# Patient Record
Sex: Male | Born: 1940
Health system: Southern US, Community
[De-identification: ages and names within clinical notes are randomized; demographics above are authoritative.]

## PROBLEM LIST (undated history)

## (undated) DIAGNOSIS — J189 Pneumonia, unspecified organism: Secondary | ICD-10-CM

## (undated) DIAGNOSIS — I6522 Occlusion and stenosis of left carotid artery: Secondary | ICD-10-CM

## (undated) DIAGNOSIS — C801 Malignant (primary) neoplasm, unspecified: Secondary | ICD-10-CM

## (undated) DIAGNOSIS — I639 Cerebral infarction, unspecified: Secondary | ICD-10-CM

## (undated) DIAGNOSIS — E785 Hyperlipidemia, unspecified: Secondary | ICD-10-CM

## (undated) DIAGNOSIS — E119 Type 2 diabetes mellitus without complications: Secondary | ICD-10-CM

## (undated) HISTORY — DX: Hyperlipidemia, unspecified: E78.5

---

## 2002-07-06 ENCOUNTER — Encounter: Admission: RE | Admit: 2002-07-06 | Discharge: 2002-10-04 | Payer: Self-pay | Admitting: Family Medicine

## 2002-08-12 ENCOUNTER — Ambulatory Visit (HOSPITAL_COMMUNITY): Admission: RE | Admit: 2002-08-12 | Discharge: 2002-08-12 | Payer: Self-pay | Admitting: Gastroenterology

## 2002-08-12 ENCOUNTER — Encounter (INDEPENDENT_AMBULATORY_CARE_PROVIDER_SITE_OTHER): Payer: Self-pay | Admitting: Specialist

## 2002-10-19 ENCOUNTER — Encounter: Admission: RE | Admit: 2002-10-19 | Discharge: 2003-01-17 | Payer: Self-pay | Admitting: Family Medicine

## 2005-05-06 HISTORY — PX: FEMORAL-FEMORAL BYPASS GRAFT: SHX936

## 2005-12-19 ENCOUNTER — Ambulatory Visit (HOSPITAL_COMMUNITY): Admission: RE | Admit: 2005-12-19 | Discharge: 2005-12-19 | Payer: Self-pay | Admitting: Vascular Surgery

## 2005-12-30 ENCOUNTER — Encounter (INDEPENDENT_AMBULATORY_CARE_PROVIDER_SITE_OTHER): Payer: Self-pay | Admitting: *Deleted

## 2005-12-30 ENCOUNTER — Inpatient Hospital Stay (HOSPITAL_COMMUNITY): Admission: RE | Admit: 2005-12-30 | Discharge: 2006-01-06 | Payer: Self-pay | Admitting: Vascular Surgery

## 2005-12-31 ENCOUNTER — Encounter: Payer: Self-pay | Admitting: Vascular Surgery

## 2006-03-13 ENCOUNTER — Encounter: Admission: RE | Admit: 2006-03-13 | Discharge: 2006-06-11 | Payer: Self-pay | Admitting: Family Medicine

## 2010-09-21 NOTE — Discharge Summary (Signed)
Eric Long, Eric Long               ACCOUNT NO.:  [id number]   MEDICAL RECORD NO.:  [id number]          PATIENT TYPE:  INP   LOCATION:  2013                         FACILITY:  [facility]   PHYSICIAN:  Quita Skye. Hart Rochester, M.D.  DATE OF BIRTH:  1940/11/19   DATE OF ADMISSION:  12/30/2005  DATE OF DISCHARGE:  01/06/2006                                 DISCHARGE SUMMARY   ADMISSION DIAGNOSIS:  Severe aortoiliac occlusive disease, bilateral common  femoral artery disease, and superficial femoral artery occlusive disease  with severe bilateral lower extremity claudication.   DISCHARGE/SECONDARY DIAGNOSES:  1. Severe aortoiliac occlusive disease, bilateral common femoral artery      disease, and superficial femoral artery occlusive disease with severe      bilateral lower extremity claudication.  2. Postoperative tachycardia.  3. Postoperative right pneumothorax requiring chest tube insertion.  4. Postoperative acute blood loss anemia.  5. Diabetes mellitus type 2.   No known drug allergies.   CONSULTATIONS:  1. Cardiology, Dr. Elease Hashimoto.  2. Thoracic surgery, Dr. Norton Blizzard.   PROCEDURE:  December 30, 2005 proximal aortic endarterectomy with aortobi  common femoral bypass using an 18 x 9  mm Hemashield Dacron graft, extensive  right distal external iliac, common femoral, common proximal, superficial  femoral and profunda femoris endarterectomy, extensive left distal external  iliac, common femoral, proximal superficial femoral, and profunda femoris  endarterectomy with patch angioplasties bilaterally.  Surgeon, Dr. Josephina Gip.   BRIEF HISTORY:  Eric Long is a 70 year old African-American male who was  initially evaluated approximately 3 years ago with complaint of bilateral  lower extremity pain.  It was Dr. Candie Chroman opinion that the patient had both  aortoiliac as well as femoral to popliteal occlusive disease. The patient  opted not to proceed with arteriogram at that time. Since  then his symptoms  have progressed and his calf claudication has become limiting. He denies any  rest pain or nonhealing ulcers.  ABIs performed revealed 0.48 on the right  and 0.33 on the left.  Angiography revealed severe aortoiliac occlusive  disease with near total occlusion of the bilateral common femoral arteries  and tibial disease bilaterally with occlusion of the anterior tibials but  patent posterior tibials and peroneals. Dr. Hart Rochester recommended that he  undergo aortobifemoral bypass grafting with bilateral femoral artery  endarterectomies. The patient was in agreement to proceed but prior to  scheduling the surgery, he did undergo cardiac workup and had a Cardiolite  on August 24 which was normal.   HOSPITAL COURSE:  Eric Long was electively admitted to [facility]  on December 30, 2005 and then underwent aortobifemoral bypass grafting as  previously outlined.  Postoperatively he was transferred to the surgical  intensive care unit.  A left subclavian Swan-Ganz catheter was placed prior  to surgery with post chest x-ray showing a small right apical pneumothorax  which had increased to about 20% within a few hours. On the morning of  August 28, it had further increased to around 25%. At this time, thoracic  surgeon, Dr. Edwyna Shell, was notified and did  place a right anterior chest tube.  Follow-up chest x-ray did show resolution of the pneumothorax.  This was  initially placed to suction and then to water seal and eventually  discontinued on August 31 with chest x-rays showing no evidence of  pneumothorax. In regards to his surgery, he remained stable postoperatively  but was tachycardic with a heart rate around 120. Although the patient  denied any chest pain, as a precaution cardiac enzymes were obtained and  after 3 sets his troponin peaked at 0.52. Cardiology was asked to evaluate  Eric Long again and did add beta blocker therapy for rate control but did  not feel that  he had an acute myocardial event. Rather they felt the mildly  troponin was probably secondary to his rate, anemia and surgery. He was also  started on aspirin but otherwise no other inpatient cardiac procedures were  required and rate did improve somewhat with rate around 90-100 at the time  of this dictation. Postoperative ABIs were improved with the right at 0.5  and the left at 0.48. His feet remained warm. From a GI standpoint, he was  initially somewhat distended and his nasogastric tube stayed in for 2 days.  As his output decreased, the NG tube was discontinued.  Dulcolax  suppositories were used for bowel stimulation.  He did gradually begin  passing gas and was able to have a postoperative bowel movement.  His diet  was advanced in the usual fashion and by postoperative day #5 was given a  regular diet.  At that time, his Foley catheter was also removed and he was  able to void without difficulty.  By this time, he had been transferred onto  telemetry at 2000 and it is anticipated he will remain there until  discharge. On postoperative day 6, September 2, it was felt that Eric Long  was nearly ready for discharge. His vitals remained stable, he was afebrile  with blood pressure 123/59, heart rate in the 80s and in sinus rhythm,  oxygen saturation 99% on room air.  His blood sugars were primarily ranging  around 100-180 on sliding scale insulin.  He was tolerating a regular diet  and mobilizing without difficulty. Exam showed his heart had a regular rate  and rhythm.  Lung sounds were clear.  The abdominal exam was soft with the  decreasing distension and good bowel sounds.  His incisions remained clean,  dry, and intact without evidence of hematoma.  His most recent labs showed a  sodium of 135, potassium 3.9, chloride 101, CO2 29, blood glucose 154, BUN  9, creatinine 0.8, total bilirubin 1.0. Alkaline phosphatase 194 . AST 62, ALT of 93 which were all a little elevated from his  initial postoperative  liver function tests.  His white blood count was 12.3, hemoglobin 8.7,  hematocrit 24.7, platelet count 189. Of note, both a followup CMET and CBC  had been ordered prior to discharge but are still pending at the time of  this dictation. If the patient remains stable and his labs show improvement,  it is anticipated he will be ready for discharge home on January 06, 2006.   DISCHARGE MEDICATIONS:  1. Ultram 50 mg 1-2 tablets p.o. q.6 h p.r.n. pain.  2. Lopressor 50 mg p.o. b.i.d.  3. Coated aspirin 325 mg p.o. daily.   DISCHARGE INSTRUCTIONS:  He is instructed to continue a diabetic appropriate  diet.  He may shower and clean his incision gently with soap and water.  He  should notify the [facility] office if he develops redness or drainage from his  incision site, fever greater than 101, abdominal pain or increased  distension or nausea and vomiting.  He is to avoid driving or heavy lifting  for the next 3 weeks.  He was encouraged to continue daily walking and  breathing exercises and should increase his activity as tolerated.   FOLLOW UP:  He is to follow up with Dr. Hart Rochester at the CVTS office in  approximately 3 weeks with ABIs. He is to have his staple removed at the  CV[facility] office on January 13, 2006. The office will notify him of the  specific time. He should also call and schedule followup with Dr. Elease Hashimoto for  further evaluation of his tachycardia.      Jerold Coombe, P.A.    ______________________________  Quita Skye Hart Rochester, M.D.    AWZ/MEDQ  D:  01/05/2006  T:  01/06/2006  Job:  [id number]   cc:   Vesta Mixer, M.D.

## 2010-09-21 NOTE — Op Note (Signed)
   Eric Long, Eric Long                           ACCOUNT NO.:  [id number]   MEDICAL RECORD NO.:  [id number]                   PATIENT TYPE:  AMB   LOCATION:  ENDO                                 FACILITY:  [facility]   PHYSICIAN:  John C. Madilyn Fireman, M.D.                 DATE OF BIRTH:  15-Dec-1940   DATE OF PROCEDURE:  08/12/2002  DATE OF DISCHARGE:                                 OPERATIVE REPORT   PROCEDURE:  Colonoscopy with polypectomy.   INDICATIONS FOR PROCEDURE:  Colon cancer screening in a 70 year old patient  with no prior screening.   DESCRIPTION OF PROCEDURE:  The patient was placed in the left lateral  decubitus position and placed on the pulse monitor with continuous low-flow  oxygen delivered by nasal cannula.  He was sedated with 75 mcg IV fentanyl  and 6 mg IV Versed.  The Olympus video colonoscope was inserted into the  rectum and advanced to the cecum, confirmed by transillumination at  McBurney's point and visualization of the ileocecal valve and appendiceal  orifice.  The prep was excellent.  The cecum and ascending appeared normal  with no masses, polyps, diverticula, or other mucosal abnormalities.  Within  the transverse sigmoid colon, there were seen two polyps in the distal  transverse colon approximately 5 cm separate from each other.  One was 8-10  mm in diameter.  The more distal one was 10 mm in diameter.  They were both  removed by snare and sent in the same specimen container.  The descending  colon appeared normal with no further polyps.  Within the sigmoid colon,  there was a 1.2 cm polyp removed by snare.  The remainder of the sigmoid and  rectum appeared normal.  The scope was then withdrawn, and the patient  returned to the recovery room in stable condition.  He tolerated the  procedure well, and there were no immediate complications.   IMPRESSION:  Three colon polyps.   PLAN:  Await histology to determine method and interval for future colon   screening.                                               John C. Madilyn Fireman, M.D.    JCH/MEDQ  D:  08/12/2002  T:  08/12/2002  Job:  [id number]   cc:   Vikki Ports, M.D.  [address] [place]  [place]  [place] [address]  Fax: [phone]

## 2010-09-21 NOTE — Op Note (Signed)
NAMEJOHNEL, YIELDING               ACCOUNT NO.:  [id number]   MEDICAL RECORD NO.:  [id number]          PATIENT TYPE:  AMB   LOCATION:  SDS                          FACILITY:  [facility]   PHYSICIAN:  Quita Skye. Hart Rochester, M.D.  DATE OF BIRTH:  21-Nov-1940   DATE OF PROCEDURE:  12/19/2005  DATE OF DISCHARGE:                                 OPERATIVE REPORT   PREOPERATIVE DIAGNOSIS:  Severe aortoiliac occlusive disease and possible  femoral-popliteal occlusive disease with limiting claudication both legs.   PROCEDURE:  Abdominal aortogram with bilateral lower extremity runoff via  right common femoral approach.   SURGEON:  Dr. Hart Rochester   ANESTHESIA:  Local Xylocaine.   CONTRAST:  185 mL.   COMPLICATIONS:  None.   DESCRIPTION OF PROCEDURE:  The patient was taken to the [facility]  Peripheral Endovascular Lab, placed in the supine position at which time  both groins were prepped with Betadine solution and draped in a routine  sterile manner.  After infiltration of 1% Xylocaine, the right common  femoral artery was entered percutaneously.  Guidewire passed into the  suprarenal aorta under fluoroscopic guidance.  A 5-French sheath and dilator  were passed over the guidewire, dilator removed and a standard pigtail  catheter positioned in the suprarenal aorta.  Flush abdominal aortogram was  performed injecting 20 mL of contrast at 20 mL per second.  Second injection  was performed slightly more proximally to visualize the renal arteries  better.  There were two renal arteries on the left, one on the right.  All  were patent with no significant narrowing.  Aorta had some diffuse mild  changes until the terminal portion of the aorta which was severely diseased  with an ulcerative plaque extending out to the patient's right side causing  significant luminal narrowing.  Both common iliac arteries and external  iliac arteries were diffusely diseased.  Both internal iliac arteries were  widely patent.   Inferior mesenteric artery was patent.  Additional views of  the terminal aorta were obtained using RAO and LAO projections and both  common femoral arteries were also visualized with RAO and LAO projections  which revealed near total occlusions of both common femoral arteries at the  bifurcation.  Bilateral lower extremity runoffs were performed injecting 88  mL of contrast at 8 mL per second.  This revealed the common femorals as  noted.  The profundus were patent, superficial femoral arteries were patent  but mildly diseased with a moderate narrowing in the midportion of the left  thigh, right side being widely patent with diffuse plaque.  Both popliteal  arteries were patent and there was two-vessel runoff on both sides via the  posterior tibial and peroneal arteries, anterior tibial artery being totally  occluded on the left and did not fill well on the right.  Having tolerated  procedure well, the sheath was removed, adequate compression applied.  No  complications ensued.   FINDINGS:  1. Severe aortoiliac occlusive disease with deep ulcerative plaque      terminal aorta and diffuse iliac disease bilaterally.  2. Near total  occlusion bilateral common femoral arteries.  3. Mild to moderate left mid superficial femoral occlusive disease with      mild to moderate stenosis.  4. Tibial disease bilaterally with occlusion of anterior tibial arteries,      but patent posterior tibial and peroneal artery.           ______________________________  Quita Skye Hart Rochester, M.D.     JDL/MEDQ  D:  12/19/2005  T:  12/19/2005  Job:  [id number]

## 2010-09-21 NOTE — H&P (Signed)
NAMEDERRIEN, Eric Long               ACCOUNT NO.:  [id number]   MEDICAL RECORD NO.:  [id number]           PATIENT TYPE:   LOCATION:                                 FACILITY:   PHYSICIAN:  Quita Skye. Hart Rochester, M.D.       DATE OF BIRTH:   DATE OF ADMISSION:  DATE OF DISCHARGE:                                HISTORY & PHYSICAL   PRIMARY CARE PHYSICIAN:  Vikki Ports, M.D.   CHIEF COMPLAINT:  Pain in legs.   HISTORY OF PRESENT ILLNESS:  This is a 70 year old African-American male who  was initially evaluated approximately 3 years ago with complaint of  bilateral lower extremity pain.  It was Dr. Candie Chroman opinion that the  patient had both aortoiliac as well as femoral-to-popliteal occlusive  disease.  The patient opted not to proceed with arteriogram at that time,  however,.  Since then his symptoms have progressed ad his calf claudication  has become limiting to him.  He denies rest pain and nonhealing ulcers.  ABIs performed revealed 48% on the right and 33% on the left.  Angiography  revealed severe aortoiliac occlusive disease with near total occlusion of  the bilateral common femoral arteries and tibial disease bilateral with  occlusions of the anterior tibials, but patent posterior tibials and  peroneals.   Cardiolite performed on December 27, 2005 was normal and the patient was  cleared from a cardiac standpoint for surgery.  The patient denies abdominal  pain, nausea, vomiting, constipation, hematochezia, hematemesis, back pain,  peripheral edema, dysuria, hematuria, reflux symptoms, angina and TIA/CVA  symptoms.   PAST MEDICAL HISTORY:  1. Aortoiliac occlusive disease.  2. Non-insulin-dependent diabetes mellitus.   SURGICAL HISTORY:  None.   ALLERGIES:  No known drug allergies.   MEDICATIONS:  None.   REVIEW OF SYSTEMS:  Please see HPI for significant positives and negatives;  otherwise negative for coronary disease, kidney disease.   SOCIAL HISTORY:  This is a  married male with 3 children who lives with his  family.  He continues to smoke 2 packs per day and has smoked for 47 years.  He denies alcohol use.  The patient is a retired Architect.  He has  continued to drive and has assistance available after surgery.   FAMILY HISTORY:  Noncontributory.   PHYSICAL EXAMINATION:  VITAL SIGNS:  Blood pressure 170/78 left arm,  sitting; heart rate 60, respirations 18.  GENERAL:  The was a 70 year old African-American male in no acute distress.  HEENT:  Normocephalic, atraumatic.  Pupils are equal, round, react to light  and accommodation.  Extraocular movements are intact.  Oral mucosa is pink  and moist.  Sclerae are nonicteric.  NECK:  Supple with no JVD or lymphadenopathy.  The carotids are palpable and  there are bilateral bruits auscultated.  LUNGS:  Respirations are symmetric, unlabored and clear.  CARDIAC:  Regular rate and rhythm.  ABDOMEN:  Soft, nontender, nondistended with normoactive bowel sounds and  obese.  There is an easily auscultated bruit over the aorta as well as  bilateral iliacs.  GENITOURINARY AND  RECTAL:  Deferred.  EXTREMITIES:  There is no evidence of edema, varicosities, or venous  changes. Temperature is warm.  Pulse is radial 2+ bilaterally, femoral 2+  right and 1+ left.  Popliteal and pedal pulses are not palpable bilaterally.  NEUROLOGIC EXAM:  Nonfocal.  The patient is alert and oriented x3. The gait  is steady.  Muscle strength is 5/5 throughout all extremities and symmetric.  Deep tendon reflexes are 2+.   ASSESSMENT:  Aortoiliac occlusive disease.   PLAN:  Aortobifemoral bypass graft with bilateral femoral artery  endarterectomies on December 30, 2005.  Dr. Hart Rochester has seen and evaluated this  patient prior to this admission and has explained the risks and benefits of  the procedure and the patient has agreed to continue.      [name], PA    ______________________________  Quita Skye Hart Rochester,  M.D.    AY/MEDQ  D:  12/27/2005  T:  12/27/2005  Job:  [id number]   cc:   Quita Skye. Hart Rochester, M.D.

## 2010-09-21 NOTE — Op Note (Signed)
Eric Long, Eric Long               ACCOUNT NO.:  [id number]   MEDICAL RECORD NO.:  [id number]          PATIENT TYPE:  INP   LOCATION:  2312                         FACILITY:  [facility]   PHYSICIAN:  Quita Skye. Hart Rochester, M.D.  DATE OF BIRTH:  1941/03/22   DATE OF PROCEDURE:  12/30/2005  DATE OF DISCHARGE:                                 OPERATIVE REPORT   PREOPERATIVE DIAGNOSIS:  Severe aortoiliac occlusive disease, bilateral  common femoral disease, and superficial femoral occlusive disease, with  severe claudication both legs.   POSTOPERATIVE DIAGNOSIS:  Severe aortoiliac occlusive disease, bilateral  common femoral disease, and superficial femoral occlusive disease, with  severe claudication both legs.   OPERATION:  1. Proximal aortic endarterectomy with aortobi-common femoral bypass graft      using an 18 x 9 mm Hemashield Dacron graft.  2. Extensive right distal external iliac, common femoral, proximal      superficial femoral, and profunda femoris endarterectomy.  3. Extensive left distal external iliac, common femoral, proximal      superficial femoral, and profunda femoris endarterectomy, with patch      angioplasties bilaterally.   SURGEON:  Quita Skye. Hart Rochester, M.D.   FIRST ASSISTANT:  Janetta Hora. Fields, M.D.   SECOND ASSISTANT:  Coral Ceo, P.A.   ANESTHESIA:  General endotracheal.   PROCEDURE:  The patient was taken to the operating room and placed in the  supine position, at which time satisfactory general endotracheal anesthesia  was administered.  Radial arterial line and a Swan-Ganz catheter via the  left subclavian approach were inserted by anesthesia.  Abdomen and groins  were prepped with Betadine scrub and solution and draped in a routine  sterile manner.  Longitudinal incisions were made in both inguinal areas.  Common, superficial, and profunda femoris arteries were dissected free.  There was diffuse severe calcific plaquing in both common femoral arteries,  with no palpable pulse on the left and a weak palpable pulse on the right.  The plaque extended down the superficial femoral artery as far as could be  palpated.  It also extended down the profunda on both sides about 2 cm and  proximally up the external iliac arteries.  A midline incision was made from  xiphoid to just below the umbilicus, carried down through subcutaneous  tissue and linea alba using the Bovie.  Peritoneal cavity was entered and  thoroughly explored.  The stomach, duodenum, small bowel, and colon were  unremarkable.  The liver was smooth, and no palpable masses.  Gallbladder  appeared normal.  No stones were palpable.  Transverse colon was elevated.  The intestines were reflected to the right side, exposing the infrarenal  aorta.  There was a lot of inflammatory reaction around the entire  infrarenal aorta.  This did not appear to be the white, pearly inflammatory  aneurysm type reaction, but just periadventitial reaction, with no good  plane identified dissecting out the aorta.  Aorta was very dilated from the  renals distally, mildly aneurysmal, although not a true saccular aneurysm  was noted.  Aorta was dissected free down to just distal  to the inferior  mesenteric artery, which had a good pulse.  Retroperitoneal tunnels were  created posterior to the ureters.  The patient was given 25 g of mannitol  and then heparinized.  Aorta was occluded distal to the renal arteries,  transected about 3 cm distally, and oversewn with two layers of 3-0 Prolene,  buttressing this with two strips of felt.  This was checked for leaks. None  were present.  Proximal aortic stump had a lot of atheromatous debris, some  which was aneurysmal type debris, other atherosclerotic, and this was  removed to much as possible. Proximal anastomosis was done in an end-to-end  fashion, was technically very difficult because of the lack of ability to  mobilize the infrarenal neck.  An 18 x 9 mm  Hemashield Dacron graft was  anastomosed end-to-end using continuous 3-0 Prolene, buttressing this with a  strip of felt.  This checked for leaks. None were present.  Limbs were  delivered through the tunnels.  Bilaterally, there was a very similar  pattern of disease.  The external iliacs were occluded bilaterally up  beneath the inguinal ligament, and the superficial femorals were occluded,  as were the profundas distally.  Extensive endarterectomy, including the  distal external iliac, the entire common femoral, the origin of the profunda  extending down about 2 cm on both sides, but the plaque continued down the  superficial femoral as far as I could palpate, and event hough it had been  patent preoperatively there was no way to tack this plaque down because of  its extensive nature and preserve it necessarily.  Therefore, it was cut  flush distally about 3-4 cm distal to the origin with Metzenbaum scissors,  and there was some backbleeding on both sides.  All the loose debris was  removed on both sides, and long anastomoses were done with 5-0 Prolene  throughout these saw arteriotomies which were similar on both sides.  Right  leg was then finished initially, followed by the left leg with no  significant hypotension.  Protamine was then given to reverse the heparin.  Following adequate hemostasis, the groin wounds were closed in layers with  Vicryl in a subcuticular fashion and with Steri-Strips.  Retroperitoneum was  then reapproximated with 3-0 Vicryl, linea alba closed with #1 Prolene, skin  with clips.  Sterile dressing applied.  The patient was taken to the  recovery room in satisfactory condition.  He received 750 cc of blood from  the Cell Saver, 1 unit of bank blood, had excellent urinary output, and was  stable hemodynamically throughout the case.           ______________________________  Quita Skye. Hart Rochester, M.D.    JDL/MEDQ  D:  12/30/2005  T:  12/31/2005  Job:  [id number]

## 2012-01-23 ENCOUNTER — Ambulatory Visit (INDEPENDENT_AMBULATORY_CARE_PROVIDER_SITE_OTHER): Payer: Medicare Other | Admitting: Vascular Surgery

## 2012-01-23 DIAGNOSIS — R0989 Other specified symptoms and signs involving the circulatory and respiratory systems: Secondary | ICD-10-CM

## 2012-01-23 DIAGNOSIS — I6529 Occlusion and stenosis of unspecified carotid artery: Secondary | ICD-10-CM

## 2012-12-10 ENCOUNTER — Other Ambulatory Visit: Payer: Self-pay | Admitting: Gastroenterology

## 2012-12-17 ENCOUNTER — Telehealth: Payer: Self-pay | Admitting: Endocrinology

## 2012-12-17 MED ORDER — INSULIN GLARGINE 100 UNIT/ML SOLOSTAR PEN: 16.0000 [IU] | PEN_INJECTOR | Freq: Every day | SUBCUTANEOUS | Status: DC

## 2012-12-17 MED ORDER — LIRAGLUTIDE 18 MG/3ML ~~LOC~~ SOPN: 1.2000 mg | PEN_INJECTOR | Freq: Every day | SUBCUTANEOUS | Status: DC

## 2012-12-17 NOTE — Telephone Encounter (Signed)
rx sent

## 2013-02-05 ENCOUNTER — Other Ambulatory Visit: Payer: Self-pay | Admitting: *Deleted

## 2013-02-05 DIAGNOSIS — E119 Type 2 diabetes mellitus without complications: Secondary | ICD-10-CM

## 2013-02-12 ENCOUNTER — Other Ambulatory Visit (INDEPENDENT_AMBULATORY_CARE_PROVIDER_SITE_OTHER): Payer: Medicare Other

## 2013-02-12 DIAGNOSIS — E119 Type 2 diabetes mellitus without complications: Secondary | ICD-10-CM

## 2013-02-12 LAB — COMPREHENSIVE METABOLIC PANEL
Alkaline Phosphatase: 104 U/L (ref 39–117)
BUN: 16 mg/dL (ref 6–23)
Glucose, Bld: 128 mg/dL — ABNORMAL HIGH (ref 70–99)
Total Bilirubin: 0.4 mg/dL (ref 0.3–1.2)

## 2013-02-12 LAB — URINALYSIS
Hgb urine dipstick: NEGATIVE
Nitrite: NEGATIVE
Total Protein, Urine: NEGATIVE
pH: 6 (ref 5.0–8.0)

## 2013-02-12 LAB — MICROALBUMIN / CREATININE URINE RATIO
Microalb Creat Ratio: 0.7 mg/g (ref 0.0–30.0)
Microalb, Ur: 1.6 mg/dL (ref 0.0–1.9)

## 2013-02-16 ENCOUNTER — Encounter: Payer: Self-pay | Admitting: Endocrinology

## 2013-02-16 ENCOUNTER — Ambulatory Visit (INDEPENDENT_AMBULATORY_CARE_PROVIDER_SITE_OTHER): Payer: Medicare Other | Admitting: Endocrinology

## 2013-02-16 VITALS — BP 114/56 | HR 96 | Temp 98.6°F | Resp 12 | Ht 70.0 in | Wt 203.1 lb

## 2013-02-16 DIAGNOSIS — E7800 Pure hypercholesterolemia, unspecified: Secondary | ICD-10-CM

## 2013-02-16 DIAGNOSIS — N183 Chronic kidney disease, stage 3 unspecified: Secondary | ICD-10-CM

## 2013-02-16 DIAGNOSIS — I70209 Unspecified atherosclerosis of native arteries of extremities, unspecified extremity: Secondary | ICD-10-CM

## 2013-02-16 DIAGNOSIS — E119 Type 2 diabetes mellitus without complications: Secondary | ICD-10-CM

## 2013-02-16 NOTE — Progress Notes (Signed)
Patient ID: Eric Long, male   DOB: January 29, 1941, 72 y.o.   MRN: [id number]  Eric Long is an 72 y.o. male.   Reason for Appointment: Diabetes follow-up   History of Present Illness   Diagnosis: Type 2 DIABETES MELITUS, date of diagnosis: 2006      Previous history: He was previously treated with metformin, Amaryl and Januvia but subsequently had poor control with A1c of 11.8 in 2011 and 11.6 in 7/13 In 8/13 is Januvia was stopped and he was started on Victoza and Lantus insulin With this his blood sugars were improved significantly and his Amaryl was also stopped. He also started losing weight His A1c in 6/14 was excellent at 6.3  Recent history: He continues to have very good control of his diabetes and has lost another 16 pounds since June Has been very compliant with his diet and exercise regimen as well as continuing low dose Lantus insulin with his Victoza. His metformin dose has been limited by his renal dysfunction     Oral hypoglycemic drugs: Metformin      Side effects from medications: None Insulin regimen: Lantus 16 units at bedtime         Proper timing of medications in relation to meals: Yes.          Monitors blood glucose: Once a day.    Glucometer: One Touch.          Blood Glucose readings from meter download: readings before breakfast: 96-102 pm 139-144  Hypoglycemia frequency:  none        Meals: 3 meals per day.          Physical activity: exercise: Walking upto 2 miles           Dietician visit: Most recent:?        Complications: are: Peripheral vascular disease, neuropathy     Wt Readings from Last 3 Encounters:  02/16/13 203 lb 1.6 oz (92.126 kg)    LABS:  Appointment on 02/12/2013  Component Date Value Range Status  . Hemoglobin A1C 02/12/2013 6.7* 4.6 - 6.5 % Final   Glycemic Control Guidelines for People with Diabetes:Non Diabetic:  <6%Goal of Therapy: <7%Additional Action Suggested:  >8%   . Sodium 02/12/2013 142  135 - 145 mEq/L Final   . Potassium 02/12/2013 4.6  3.5 - 5.1 mEq/L Final  . Chloride 02/12/2013 102  96 - 112 mEq/L Final  . CO2 02/12/2013 31  19 - 32 mEq/L Final  . Glucose, Bld 02/12/2013 128* 70 - 99 mg/dL Final  . BUN 30/86/5784 16  6 - 23 mg/dL Final  . Creatinine, Ser 02/12/2013 1.5  0.4 - 1.5 mg/dL Final  . Total Bilirubin 02/12/2013 0.4  0.3 - 1.2 mg/dL Final  . Alkaline Phosphatase 02/12/2013 104  39 - 117 U/L Final  . AST 02/12/2013 17  0 - 37 U/L Final  . ALT 02/12/2013 24  0 - 53 U/L Final  . Total Protein 02/12/2013 7.5  6.0 - 8.3 g/dL Final  . Albumin 69/62/9528 3.7  3.5 - 5.2 g/dL Final  . Calcium 41/32/4401 9.4  8.4 - 10.5 mg/dL Final  . GFR 02/72/5366 59.18* >60.00 mL/min Final  . Color, Urine 02/12/2013 LT. YELLOW  Yellow;Lt. Yellow Final  . APPearance 02/12/2013 CLEAR  Clear Final  . Specific Gravity, Urine 02/12/2013 1.025  1.000-1.030 Final  . pH 02/12/2013 6.0  5.0 - 8.0 Final  . Total Protein, Urine 02/12/2013 NEGATIVE  Negative Final  . Urine  Glucose 02/12/2013 NEGATIVE  Negative Final  . Ketones, ur 02/12/2013 TRACE  Negative Final  . Bilirubin Urine 02/12/2013 NEGATIVE  Negative Final  . Hgb urine dipstick 02/12/2013 NEGATIVE  Negative Final  . Urobilinogen, UA 02/12/2013 0.2  0.0 - 1.0 Final  . Leukocytes, UA 02/12/2013 NEGATIVE  Negative Final  . Nitrite 02/12/2013 NEGATIVE  Negative Final  . Microalb, Ur 02/12/2013 1.6  0.0 - 1.9 mg/dL Final  . Creatinine,U 40/98/1191 237.8   Final  . Microalb Creat Ratio 02/12/2013 0.7  0.0 - 30.0 mg/g Final      Medication List       This list is accurate as of: 02/16/13  9:54 AM.  Always use your most recent med list.               amLODipine 5 MG tablet  Commonly known as:  NORVASC  Take 5 mg by mouth daily.     atorvastatin 80 MG tablet  Commonly known as:  LIPITOR  Take 80 mg by mouth daily.     Insulin Glargine 100 UNIT/ML Sopn  Commonly known as:  LANTUS SOLOSTAR  Inject 16 Units into the skin daily.     Insulin  Pen Needle 32G X 4 MM Misc  2 each by Does not apply route.     Liraglutide 18 MG/3ML Sopn  Commonly known as:  VICTOZA  Inject 1.2 mg into the skin daily.     metFORMIN 500 MG tablet  Commonly known as:  GLUCOPHAGE  Take 500 mg by mouth 2 (two) times daily with a meal. 2 tablets 2 times a day     metoprolol 50 MG tablet  Commonly known as:  LOPRESSOR  Take 50 mg by mouth daily.     ZOSTAVAX 47829 UNT/0.65ML injection  Generic drug:  zoster vaccine live (PF)        Allergies: No Known Allergies  No past medical history on file.  No past surgical history on file.  No family history on file.  Social History:  reports that he has quit smoking. He has never used smokeless tobacco. His alcohol and drug histories are not on file.  Review of Systems:  Hypertension:  home   Lipids: As last LDL was 86 in 6/14, is compliant with his Lipitor 80 mg  Has history of erectile dysfunction    He  has had mild symptoms of neuropathy and objective findings also   Examination:   BP 114/56  Pulse 96  Temp(Src) 98.6 F (37 C)  Resp 12  Ht 5\' 10"  (1.778 m)  Wt 203 lb 1.6 oz (92.126 kg)  BMI 29.14 kg/m2  SpO2 100%  Body mass index is 29.14 kg/(m^2).   Standing 126/62  ASSESSMENT/ PLAN::   Diabetes type 2   Blood glucose control is excellent with fairly good blood sugars at home although A1c is slightly high at 6.7 He has done well with diet and exercise and lost weight No hypoglycemia current dose of Lantus so will continue the same dose for now along with his Lantus and low dose metformin  CKD: His creatinine is about the same, etiology of renal dysfunction unknown  Luciano Cinquemani 02/16/2013, 9:54 AM

## 2013-02-18 LAB — HM DIABETES EYE EXAM

## 2013-02-19 ENCOUNTER — Encounter: Payer: Self-pay | Admitting: Endocrinology

## 2013-02-19 DIAGNOSIS — I70209 Unspecified atherosclerosis of native arteries of extremities, unspecified extremity: Secondary | ICD-10-CM | POA: Insufficient documentation

## 2013-02-19 DIAGNOSIS — N183 Chronic kidney disease, stage 3 unspecified: Secondary | ICD-10-CM | POA: Insufficient documentation

## 2013-02-19 DIAGNOSIS — E7800 Pure hypercholesterolemia, unspecified: Secondary | ICD-10-CM | POA: Insufficient documentation

## 2013-05-25 ENCOUNTER — Other Ambulatory Visit: Payer: Self-pay | Admitting: *Deleted

## 2013-05-25 MED ORDER — INSULIN GLARGINE 100 UNIT/ML SOLOSTAR PEN: 16.0000 [IU] | PEN_INJECTOR | Freq: Every day | SUBCUTANEOUS | Status: DC

## 2013-05-25 MED ORDER — INSULIN PEN NEEDLE 32G X 4 MM MISC: 2.0000 | Freq: Two times a day (BID) | Status: DC

## 2013-05-25 MED ORDER — METFORMIN HCL 500 MG PO TABS: ORAL_TABLET | ORAL | Status: DC

## 2013-05-25 MED ORDER — LIRAGLUTIDE 18 MG/3ML ~~LOC~~ SOPN: 1.2000 mg | PEN_INJECTOR | Freq: Every day | SUBCUTANEOUS | Status: DC

## 2013-07-15 ENCOUNTER — Other Ambulatory Visit (INDEPENDENT_AMBULATORY_CARE_PROVIDER_SITE_OTHER): Payer: Medicare Other

## 2013-07-15 DIAGNOSIS — E119 Type 2 diabetes mellitus without complications: Secondary | ICD-10-CM

## 2013-07-15 LAB — LIPID PANEL
CHOLESTEROL: 138 mg/dL (ref 0–200)
HDL: 34 mg/dL — ABNORMAL LOW (ref >39.00)
LDL CALC: 70 mg/dL (ref 0–99)
TRIGLYCERIDES: 172 mg/dL — AB (ref 0.0–149.0)
Total CHOL/HDL Ratio: 4
VLDL: 34.4 mg/dL (ref 0.0–40.0)

## 2013-07-15 LAB — COMPREHENSIVE METABOLIC PANEL
ALT: 31 U/L (ref 0–53)
AST: 22 U/L (ref 0–37)
Albumin: 3.7 g/dL (ref 3.5–5.2)
Alkaline Phosphatase: 108 U/L (ref 39–117)
BILIRUBIN TOTAL: 0.6 mg/dL (ref 0.3–1.2)
BUN: 15 mg/dL (ref 6–23)
CO2: 30 mEq/L (ref 19–32)
Calcium: 9.3 mg/dL (ref 8.4–10.5)
Chloride: 101 mEq/L (ref 96–112)
Creatinine, Ser: 1.5 mg/dL (ref 0.4–1.5)
GFR: 58.21 mL/min — ABNORMAL LOW (ref >60.00)
GLUCOSE: 104 mg/dL — AB (ref 70–99)
Potassium: 4.3 mEq/L (ref 3.5–5.1)
SODIUM: 138 meq/L (ref 135–145)
Total Protein: 7.4 g/dL (ref 6.0–8.3)

## 2013-07-15 LAB — HEMOGLOBIN A1C: Hgb A1c MFr Bld: 6.8 % — ABNORMAL HIGH (ref 4.6–6.5)

## 2013-07-19 ENCOUNTER — Ambulatory Visit (INDEPENDENT_AMBULATORY_CARE_PROVIDER_SITE_OTHER): Payer: Medicare Other | Admitting: Endocrinology

## 2013-07-19 ENCOUNTER — Encounter: Payer: Self-pay | Admitting: Endocrinology

## 2013-07-19 VITALS — BP 140/60 | HR 96 | Temp 98.0°F | Resp 16 | Ht 70.0 in | Wt 236.2 lb

## 2013-07-19 DIAGNOSIS — N183 Chronic kidney disease, stage 3 unspecified: Secondary | ICD-10-CM

## 2013-07-19 DIAGNOSIS — E119 Type 2 diabetes mellitus without complications: Secondary | ICD-10-CM

## 2013-07-19 NOTE — Progress Notes (Signed)
Patient ID: Eric Long, male   DOB: 09-May-1940, 73 y.o.   MRN: [id number]   Reason for Appointment: Diabetes follow-up   History of Present Illness   Diagnosis: Type 2 DIABETES MELITUS, date of diagnosis: 2006      Previous history: He was previously treated with metformin, Amaryl and Januvia but subsequently had poor control with A1c of 11.8 in 2011 and 11.6 in 7/13 In 8/13 is Januvia was stopped and he was started on Victoza and Lantus insulin With this his blood sugars were improved significantly and his Amaryl was stopped. He also started losing weight His A1c in 6/14 was excellent at 6.3  Recent history:  He has gained a significant amount of weight since his last visit in 10/14 He thinks this is all from poor diet with eating the wrong type of foods, more snacks and not doing any portion control This is despite taking Victoza which had helped him with weight loss previously; he thinks he is taking this although currently insurance is not paying for this and he may not be compliant with it He has seen the dietitian a few years ago and has been reluctant to followup claiming that he knows what to do Also checking blood sugars since mostly in the morning and not enough readings after meals; his new meter has only readings for the last week and he thinks his highest blood sugar is about 140 Surprisingly his overall control as judged by A1c is not any worse Has been very compliant with hislow dose Lantus insulin  His metformin dose has been limited by his renal dysfunction     Oral hypoglycemic drugs: Metformin      Side effects from medications: None Insulin regimen: Lantus 16 units at bedtime         Proper timing of medications in relation to meals: Yes.          Monitors blood glucose: Marland Kitchen    Glucometer: One [employer].          Blood Glucose readings from meter download: Morning 106, 110; after breakfast 147, 124. Dinnertime 84 Hypoglycemia frequency:  none        Meals: 3  meals per day.          Physical activity: exercise: Walking upto 2 miles, 3/7 days a week           Dietician visit: Most recent:?        Complications: are: Peripheral vascular disease, neuropathy     Wt Readings from Last 3 Encounters:  07/19/13 236 lb 3.2 oz (107.14 kg)  02/16/13 203 lb 1.6 oz (92.126 kg)     LABS:  Lab Results  Component Value Date   HGBA1C 6.8* 07/15/2013   HGBA1C 6.7* 02/12/2013   Lab Results  Component Value Date   MICROALBUR 1.6 02/12/2013   LDLCALC 70 07/15/2013   CREATININE 1.5 07/15/2013     Appointment on 07/15/2013  Component Date Value Ref Range Status  . Sodium 07/15/2013 138  135 - 145 mEq/L Final  . Potassium 07/15/2013 4.3  3.5 - 5.1 mEq/L Final  . Chloride 07/15/2013 101  96 - 112 mEq/L Final  . CO2 07/15/2013 30  19 - 32 mEq/L Final  . Glucose, Bld 07/15/2013 104* 70 - 99 mg/dL Final  . BUN 07/15/2013 15  6 - 23 mg/dL Final  . Creatinine, Ser 07/15/2013 1.5  0.4 - 1.5 mg/dL Final  . Total Bilirubin 07/15/2013 0.6  0.3 - 1.2  mg/dL Final  . Alkaline Phosphatase 07/15/2013 108  39 - 117 U/L Final  . AST 07/15/2013 22  0 - 37 U/L Final  . ALT 07/15/2013 31  0 - 53 U/L Final  . Total Protein 07/15/2013 7.4  6.0 - 8.3 g/dL Final  . Albumin 07/15/2013 3.7  3.5 - 5.2 g/dL Final  . Calcium 07/15/2013 9.3  8.4 - 10.5 mg/dL Final  . GFR 07/15/2013 58.21* >60.00 mL/min Final  . Cholesterol 07/15/2013 138  0 - 200 mg/dL Final   ATP III Classification       Desirable:  < 200 mg/dL               Borderline High:  200 - 239 mg/dL          High:  > = 240 mg/dL  . Triglycerides 07/15/2013 172.0* 0.0 - 149.0 mg/dL Final   Normal:  <150 mg/dLBorderline High:  150 - 199 mg/dL  . HDL 07/15/2013 34.00* >39.00 mg/dL Final  . VLDL 07/15/2013 34.4  0.0 - 40.0 mg/dL Final  . LDL Cholesterol 07/15/2013 70  0 - 99 mg/dL Final  . Total CHOL/HDL Ratio 07/15/2013 4   Final                  Men          Women1/2 Average Risk     3.4          3.3Average Risk           5.0          4.42X Average Risk          9.6          7.13X Average Risk          15.0          11.0                      . Hemoglobin A1C 07/15/2013 6.8* 4.6 - 6.5 % Final   Glycemic Control Guidelines for People with Diabetes:Non Diabetic:  <6%Goal of Therapy: <7%Additional Action Suggested:  >8%       Medication List       This list is accurate as of: 07/19/13  8:58 AM.  Always use your most recent med list.               amLODipine 5 MG tablet  Commonly known as:  NORVASC  Take 5 mg by mouth daily.     atorvastatin 80 MG tablet  Commonly known as:  LIPITOR  Take 80 mg by mouth daily.     Insulin Glargine 100 UNIT/ML Solostar Pen  Commonly known as:  LANTUS SOLOSTAR  Inject 16 Units into the skin daily.     Insulin Pen Needle 32G X 4 MM Misc  2 each by Does not apply route 2 (two) times daily.     Liraglutide 18 MG/3ML Sopn  Commonly known as:  VICTOZA  Inject 1.2 mg into the skin daily.     metFORMIN 500 MG tablet  Commonly known as:  GLUCOPHAGE  Take 2 tablets 2 times a day     metoprolol 50 MG tablet  Commonly known as:  LOPRESSOR  Take 50 mg by mouth 2 (two) times daily.     ZOSTAVAX 34196 UNT/0.65ML injection  Generic drug:  zoster vaccine live (PF)        Allergies: No Known Allergies  No past medical history on file.  Past Surgical History  Procedure Laterality Date  . Femoral-femoral bypass graft Bilateral 2007    Family History  Problem Relation Age of Onset  . Diabetes Sister     Social History:  reports that he has quit smoking. He has never used smokeless tobacco. His alcohol and drug histories are not on file.  Review of Systems:  Hypertension:  home blood pressure readings are usually good  Lipids: Has had significant dyslipidemia, treated with 80 mg Lipitor only  Lab Results  Component Value Date   CHOL 138 07/15/2013   HDL 34.00* 07/15/2013   LDLCALC 70 07/15/2013   TRIG 172.0* 07/15/2013   CHOLHDL 4 07/15/2013   Has history  of erectile dysfunction    He  has had mild symptoms of neuropathy and objective findings also   Examination:   BP 140/60  Pulse 96  Temp(Src) 98 F (36.7 C)  Resp 16  Ht 5\' 10"  (1.778 m)  Wt 236 lb 3.2 oz (107.14 kg)  BMI 33.89 kg/m2  SpO2 95%  Body mass index is 33.89 kg/(m^2).    ASSESSMENT/ PLAN:   Diabetes type 2   Blood glucose control is  about the same with fairly good blood sugars at home although not able to see more than the last few days on his monitor He had previously done really well with losing weight on Victoza but has a gained a significant amount This is because of his difficulty following his diet and noncompliance He agrees to see the dietitian for meal planning and weight loss He is exercising but probably will do more with improved weather  Since A1c is reasonably good will not change his treatment regimen  CKD: His creatinine is about the same, etiology of renal dysfunction unknown  Minal Stuller 07/19/2013, 8:58 AM

## 2013-07-19 NOTE — Patient Instructions (Addendum)
Check cost of Bydureon, Trulicity 1.5mg  weekly  Walk daily  Watch diet better

## 2013-07-27 ENCOUNTER — Encounter: Payer: Medicare HMO | Attending: Endocrinology | Admitting: Nutrition

## 2013-07-27 VITALS — Wt 234.0 lb

## 2013-07-27 DIAGNOSIS — Z713 Dietary counseling and surveillance: Secondary | ICD-10-CM | POA: Insufficient documentation

## 2013-07-27 DIAGNOSIS — E119 Type 2 diabetes mellitus without complications: Secondary | ICD-10-CM

## 2013-07-27 NOTE — Patient Instructions (Signed)
1. Add one ounce of protein at breakfast:  One ounce low fat cheese, 2T peanut butter, 1ounce of [place], low sodium ham, 1 egg. 2. Make sure his evening meal has 1-2 ounces of protein (7-14 grams).   3. Take the Lantus at the same time each day.  He agreed to do this as well. 4.  Return in 4 week.

## 2013-07-27 NOTE — Progress Notes (Signed)
Patient has lost 2.3 pounds in the last week.  I asked him how he has done this, and he said that he has reduced him portion sizes and stopped snacking between meals. Bfast: 8AM:  2 pieces of toast with margarine, coffee with splenda and flavored cream           Afternoon:  Is drinking diet soda or small (4 ounces) of orange juice           6-8PM:  One can of bean soup with 4-6 crackers and water to drink, or 1 cup of spaghetti with meat sauce             Nothing to eat after supper. Exercise.  He is walking 2 miles, which takes him 1 hour.  He needs to stop due to pain in his legs.  He will do steps at home for 30 min. If it is raining.   Medication:  Lantus 16u.  He sometimes takes this at 12 noon, and sometimes at MN. Low blood sugars:  None this week.  FBS today was 123  I told him that he was just eating about 600- 800 calories/day, and that it is recommended that he eat at least 1000.   He refuses to eat more, saying that he can not loose weight.  He is insisting that this is the only thing that will work for him.  I told him that I will be happy to work with him on this if he: 1. Adds one ounce of protein at breakfast: suggestions given to him for this. 2. Make sure his evening meal has 1-2 ounces of protein (7-14 grams).  He was shown how to look on the soup labels to determine this, and he agreed to do this.   3. Also stressed the need to take the Lantus at the same time each day.  He agreed to do this as well. 4.  Return in 4 week.  Discussed the fact that his blood sugars may start coming down, as his weight reduces, and his 2hr. pc readings reduce.  We reviewed the symptoms of low blood sugars, treatments, and what to do if the blood sugars drop below 80 (reduce the Lantus dose by 2 units when this occurs).  He reported good understanding of this, and had no final questions.

## 2013-08-24 ENCOUNTER — Encounter: Payer: Medicare Other | Attending: Endocrinology | Admitting: Nutrition

## 2013-08-24 DIAGNOSIS — Z713 Dietary counseling and surveillance: Secondary | ICD-10-CM | POA: Insufficient documentation

## 2013-08-24 DIAGNOSIS — E119 Type 2 diabetes mellitus without complications: Secondary | ICD-10-CM | POA: Insufficient documentation

## 2013-10-14 ENCOUNTER — Other Ambulatory Visit: Payer: Self-pay | Admitting: *Deleted

## 2013-10-14 MED ORDER — LIRAGLUTIDE 18 MG/3ML ~~LOC~~ SOPN: 1.2000 mg | PEN_INJECTOR | Freq: Every day | SUBCUTANEOUS | Status: DC

## 2013-10-18 ENCOUNTER — Ambulatory Visit: Payer: Medicare Other | Admitting: Endocrinology

## 2013-10-19 ENCOUNTER — Ambulatory Visit: Payer: Medicare Other | Admitting: Endocrinology

## 2013-10-21 ENCOUNTER — Ambulatory Visit (INDEPENDENT_AMBULATORY_CARE_PROVIDER_SITE_OTHER): Payer: Medicare Other | Admitting: Endocrinology

## 2013-10-21 ENCOUNTER — Encounter: Payer: Self-pay | Admitting: Endocrinology

## 2013-10-21 VITALS — BP 138/70 | HR 83 | Temp 98.3°F | Resp 14 | Ht 70.0 in | Wt 239.6 lb

## 2013-10-21 DIAGNOSIS — E1165 Type 2 diabetes mellitus with hyperglycemia: Principal | ICD-10-CM

## 2013-10-21 DIAGNOSIS — E119 Type 2 diabetes mellitus without complications: Secondary | ICD-10-CM

## 2013-10-21 DIAGNOSIS — IMO0001 Reserved for inherently not codable concepts without codable children: Secondary | ICD-10-CM

## 2013-10-21 LAB — COMPREHENSIVE METABOLIC PANEL
ALBUMIN: 3.6 g/dL (ref 3.5–5.2)
ALT: 16 U/L (ref 0–53)
AST: 17 U/L (ref 0–37)
Alkaline Phosphatase: 122 U/L — ABNORMAL HIGH (ref 39–117)
BUN: 19 mg/dL (ref 6–23)
CHLORIDE: 100 meq/L (ref 96–112)
CO2: 32 mEq/L (ref 19–32)
Calcium: 9.4 mg/dL (ref 8.4–10.5)
Creatinine, Ser: 1.5 mg/dL (ref 0.4–1.5)
GFR: 59.07 mL/min — ABNORMAL LOW (ref >60.00)
GLUCOSE: 149 mg/dL — AB (ref 70–99)
POTASSIUM: 4.3 meq/L (ref 3.5–5.1)
Sodium: 139 mEq/L (ref 135–145)
Total Bilirubin: 0.2 mg/dL (ref 0.2–1.2)
Total Protein: 7.5 g/dL (ref 6.0–8.3)

## 2013-10-21 LAB — MICROALBUMIN / CREATININE URINE RATIO
Creatinine,U: 139.3 mg/dL
Microalb Creat Ratio: 0.4 mg/g (ref 0.0–30.0)
Microalb, Ur: 0.6 mg/dL (ref 0.0–1.9)

## 2013-10-21 LAB — HEMOGLOBIN A1C: Hgb A1c MFr Bld: 7.4 % — ABNORMAL HIGH (ref 4.6–6.5)

## 2013-10-21 NOTE — Patient Instructions (Signed)
Check sugar 3x per week at least

## 2013-10-21 NOTE — Progress Notes (Signed)
Patient ID: Eric Long, male   DOB: [birth year], 73 y.o.   MRN: [id number]   Reason for Appointment: Diabetes follow-up   History of Present Illness   Diagnosis: Type 2 DIABETES MELITUS, date of diagnosis: 2006      Previous history: He was previously treated with metformin, Amaryl and Januvia but subsequently had poor control with A1c of 11.8 in 2011 and 11.6 in 7/13 In 8/13 is Januvia was stopped and he was started on Victoza and Lantus insulin With this his blood sugars were improved significantly and his Amaryl was stopped. He also started losing weight His A1c in 6/14 was excellent at 6.3  Recent history:  On his last visit in 3/15 he had gained significant amount of weight This was mostly from poor diet He has discussed meal planning with nurse educator and started to get back into his prescribed meal planning regimen Although he has not lost any weight he has gained back only 3 pounds. A1c is pending but his home blood sugars are overall looking fairly good with only rare high readings Has been very compliant with his low dose Lantus insulin and Victoza in the evening His metformin had been continued at 2 g a day. No side effects from this     Oral hypoglycemic drugs: Metformin      Side effects from medications: None Insulin regimen: Lantus 16 units at bedtime         Proper timing of medications in relation to meals: Yes.          Monitors blood glucose: Marland Kitchen    Glucometer: One [employer].          Blood Glucose readings from meter download:  PREMEAL Breakfast Lunch  2 PM   PCS  Overall  Glucose range:  110-193    143   95-132    Mean/median:      123    Hypoglycemia frequency:  none        Meals: 3 meals per day.          Physical activity: exercise: Walking upto 2 miles, 3/7 days a week           Dietician visit: Most recent:?        Complications: are: Peripheral vascular disease, neuropathy     Wt Readings from Last 3 Encounters:  10/21/13 239 lb 9.6 oz (108.682  kg)  07/27/13 234 lb (106.142 kg)  07/19/13 236 lb 3.2 oz (107.14 kg)     LABS:  Lab Results  Component Value Date   HGBA1C 6.8* 07/15/2013   HGBA1C 6.7* 02/12/2013   Lab Results  Component Value Date   MICROALBUR 1.6 02/12/2013   LDLCALC 70 07/15/2013   CREATININE 1.5 07/15/2013         Medication List       This list is accurate as of: 10/21/13 11:06 AM.  Always use your most recent med list.               amLODipine 5 MG tablet  Commonly known as:  NORVASC  Take 5 mg by mouth daily.     atorvastatin 80 MG tablet  Commonly known as:  LIPITOR  Take 80 mg by mouth daily.     Insulin Glargine 100 UNIT/ML Solostar Pen  Commonly known as:  LANTUS SOLOSTAR  Inject 16 Units into the skin daily.     Insulin Pen Needle 32G X 4 MM Misc  2 each by Does not apply  route 2 (two) times daily.     Liraglutide 18 MG/3ML Sopn  Commonly known as:  VICTOZA  Inject 1.2 mg into the skin daily.     metFORMIN 500 MG tablet  Commonly known as:  GLUCOPHAGE  Take 2 tablets 2 times a day     metoprolol 50 MG tablet  Commonly known as:  LOPRESSOR  Take 50 mg by mouth 2 (two) times daily.     ZOSTAVAX 78588 UNT/0.65ML injection  Generic drug:  zoster vaccine live (PF)        Allergies: No Known Allergies  No past medical history on file.  Past Surgical History  Procedure Laterality Date  . Femoral-femoral bypass graft Bilateral 2007    Family History  Problem Relation Age of Onset  . Diabetes Sister     Social History:  reports that he has quit smoking. He has never used smokeless tobacco. His alcohol and drug histories are not on file.  Review of Systems:  Hypertension:  home blood pressure readings are usually good  Lipids: Has had significant dyslipidemia, treated with 80 mg Lipitor   Lab Results  Component Value Date   CHOL 138 07/15/2013   HDL 34.00* 07/15/2013   LDLCALC 70 07/15/2013   TRIG 172.0* 07/15/2013   CHOLHDL 4 07/15/2013   Has history of  erectile dysfunction    He  has had mild symptoms of neuropathy and objective findings also   Examination:   BP 138/70  Pulse 83  Temp(Src) 98.3 F (36.8 C)  Resp 14  Ht 5\' 10"  (1.778 m)  Wt 239 lb 9.6 oz (108.682 kg)  BMI 34.38 kg/m2  SpO2 93%  Body mass index is 34.38 kg/(m^2).    ASSESSMENT/ PLAN:   Diabetes type 2   Blood glucose levels at home are about the same as before and generally fairly good Checking blood sugars somewhat infrequently Although his compliance with diet is improved significantly from his last visit he has not lost any weight He is exercising with walking 3 times a week A1c to be checked today  Renal dysfunction: His creatinine was high normal on the last visit and will be rechecked  [facility] 10/21/2013, 11:06 AM

## 2013-12-06 ENCOUNTER — Other Ambulatory Visit: Payer: Self-pay | Admitting: Endocrinology

## 2013-12-07 ENCOUNTER — Other Ambulatory Visit: Payer: Self-pay | Admitting: *Deleted

## 2013-12-07 MED ORDER — INSULIN DETEMIR 100 UNIT/ML FLEXPEN: 16.0000 [IU] | PEN_INJECTOR | Freq: Every day | SUBCUTANEOUS | Status: DC

## 2014-03-08 ENCOUNTER — Other Ambulatory Visit: Payer: Self-pay | Admitting: Endocrinology

## 2014-03-08 ENCOUNTER — Other Ambulatory Visit: Payer: Medicare Other

## 2014-03-08 LAB — HEMOGLOBIN A1C
HEMOGLOBIN A1C: 7.3 % — AB (ref <5.7)
Mean Plasma Glucose: 163 mg/dL — ABNORMAL HIGH (ref <117)

## 2014-03-08 LAB — BASIC METABOLIC PANEL
BUN: 16 mg/dL (ref 6–23)
CALCIUM: 9.4 mg/dL (ref 8.4–10.5)
CO2: 28 mEq/L (ref 19–32)
CREATININE: 1.48 mg/dL — AB (ref 0.50–1.35)
Chloride: 102 mEq/L (ref 96–112)
GLUCOSE: 106 mg/dL — AB (ref 70–99)
Potassium: 4.8 mEq/L (ref 3.5–5.3)
Sodium: 140 mEq/L (ref 135–145)

## 2014-03-11 ENCOUNTER — Other Ambulatory Visit: Payer: Self-pay | Admitting: *Deleted

## 2014-03-11 ENCOUNTER — Ambulatory Visit (INDEPENDENT_AMBULATORY_CARE_PROVIDER_SITE_OTHER): Payer: Medicare HMO | Admitting: Endocrinology

## 2014-03-11 ENCOUNTER — Encounter: Payer: Self-pay | Admitting: Endocrinology

## 2014-03-11 VITALS — BP 116/64 | HR 89 | Temp 98.0°F | Resp 14 | Ht 70.0 in | Wt 241.8 lb

## 2014-03-11 DIAGNOSIS — IMO0002 Reserved for concepts with insufficient information to code with codable children: Secondary | ICD-10-CM

## 2014-03-11 DIAGNOSIS — E1165 Type 2 diabetes mellitus with hyperglycemia: Secondary | ICD-10-CM

## 2014-03-11 DIAGNOSIS — N182 Chronic kidney disease, stage 2 (mild): Secondary | ICD-10-CM

## 2014-03-11 MED ORDER — LIRAGLUTIDE 18 MG/3ML ~~LOC~~ SOPN: 1.8000 mg | PEN_INJECTOR | Freq: Every day | SUBCUTANEOUS | Status: DC

## 2014-03-11 NOTE — Patient Instructions (Signed)
Victoza 1.8mg  daily  Please check blood sugars at least half the time about 2 hours after any meal and 2-3  times per week on waking up. Please bring blood sugar monitor to each visit  Watch diet

## 2014-03-11 NOTE — Progress Notes (Signed)
Patient ID: Eric Long, male   DOB: 1940-05-21, 73 y.o.   MRN: [id number]   Reason for Appointment: Diabetes follow-up   History of Present Illness   Diagnosis: Type 2 DIABETES MELITUS, date of diagnosis: 2006      Previous history: He was previously treated with metformin, Amaryl and Januvia but subsequently had poor control with A1c of 11.8 in 2011 and 11.6 in 7/13 In 8/13 is Januvia was stopped and he was started on Victoza and Lantus insulin With this his blood sugars were improved significantly and his Amaryl was stopped. He also started losing weight His A1c in 6/14 was excellent at 6.3  Recent history:  He is still inconsistent with his diet and has not lost any weight He has however done some exercise with walking Also checking his blood sugars very sporadically Probably has hypo-spent in readings which he is not monitoring; highest blood sugar was 268 after eating chocolate cake Fasting blood sugars are reasonably good including when he had his lab checked, has had occasional higher readings also A1c is still over 7% Has been compliant with his low dose Lantus insulin and Victoza in the evening His metformin had been continued at 2 g a day. No side effects from this     Oral hypoglycemic drugs: Metformin 2 g daily     Side effects from medications: None Insulin regimen: Lantus 16 units at bedtime               Monitors blood glucose: Marland Kitchen    Glucometer: One Probation officer.          Blood Glucose readings from meter download:  Has only 6 readings in the last 4 weeks and the date and time is incorrect on his monitor  PREMEAL Breakfast Lunch 2 PM Bedtime Overall  Glucose range: 109, 142 110, 180 94, 120 162, 268   Mean/median:        Hypoglycemia:  none        Meals: 3 meals per day.          Physical activity: exercise: Walking upto 2 miles, 3-4/7 days a week           Dietician visit: Most recent:?        Complications: are: Peripheral vascular disease, neuropathy      Wt Readings from Last 3 Encounters:  03/11/14 241 lb 12.8 oz (109.68 kg)  10/21/13 239 lb 9.6 oz (108.682 kg)  07/27/13 234 lb (106.142 kg)     LABS:  Lab Results  Component Value Date   HGBA1C 7.3* 03/08/2014   HGBA1C 7.4* 10/21/2013   HGBA1C 6.8* 07/15/2013   Lab Results  Component Value Date   MICROALBUR 0.6 10/21/2013   LDLCALC 70 07/15/2013   CREATININE 1.48* 03/08/2014   Orders Only on 03/08/2014  Component Date Value Ref Range Status  . Sodium 03/08/2014 140  135 - 145 mEq/L Final  . Potassium 03/08/2014 4.8  3.5 - 5.3 mEq/L Final  . Chloride 03/08/2014 102  96 - 112 mEq/L Final  . CO2 03/08/2014 28  19 - 32 mEq/L Final  . Glucose, Bld 03/08/2014 106* 70 - 99 mg/dL Final  . BUN 03/08/2014 16  6 - 23 mg/dL Final  . Creat 03/08/2014 1.48* 0.50 - 1.35 mg/dL Final  . Calcium 03/08/2014 9.4  8.4 - 10.5 mg/dL Final  . Hgb A1c MFr Bld 03/08/2014 7.3* <5.7 % Final   Comment:  According to the ADA Clinical Practice Recommendations for 2011, when HbA1c is used as a screening test:     >=6.5%   Diagnostic of Diabetes Mellitus            (if abnormal result is confirmed)   5.7-6.4%   Increased risk of developing Diabetes Mellitus   References:Diagnosis and Classification of Diabetes Mellitus,Diabetes NWGN,5621,30(QMVHQ 1):S62-S69 and Standards of Medical Care in         Diabetes - 2011,Diabetes IONG,2952,84 (Suppl 1):S11-S61.     . Mean Plasma Glucose 03/08/2014 163* <117 mg/dL Final         Medication List       This list is accurate as of: 03/11/14 10:34 AM.  Always use your most recent med list.               amLODipine 5 MG tablet  Commonly known as:  NORVASC  Take 5 mg by mouth daily.     amlodipine-benazepril 2.5-10 MG per capsule  Commonly known as:  LOTREL     atorvastatin 80 MG tablet  Commonly known as:  LIPITOR  Take 80 mg by mouth daily.     Insulin Detemir 100  UNIT/ML Pen  Commonly known as:  LEVEMIR FLEXTOUCH  Inject 16 Units into the skin daily.     Insulin Pen Needle 32G X 4 MM Misc  2 each by Does not apply route 2 (two) times daily.     LANTUS SOLOSTAR 100 UNIT/ML Solostar Pen  Generic drug:  Insulin Glargine  INJECT 16 UNITS UNDER THE SKIN ONCE DAILY.     metFORMIN 500 MG tablet  Commonly known as:  GLUCOPHAGE  Take 2 tablets 2 times a day     metoprolol 50 MG tablet  Commonly known as:  LOPRESSOR  Take 50 mg by mouth 2 (two) times daily.     metoprolol succinate 25 MG 24 hr tablet  Commonly known as:  TOPROL-XL     VICTOZA 18 MG/3ML Sopn  Generic drug:  Liraglutide  INJECT 1.2 MG INTO THE SKIN DAILY.     ZOSTAVAX 13244 UNT/0.65ML injection  Generic drug:  zoster vaccine live (PF)        Allergies: No Known Allergies  No past medical history on file.  Past Surgical History  Procedure Laterality Date  . Femoral-femoral bypass graft Bilateral 2007    Family History  Problem Relation Age of Onset  . Diabetes Sister     Social History:  reports that he has quit smoking. He has never used smokeless tobacco. His alcohol and drug histories are not on file.  Review of Systems:  Hypertension:  well controlled  Mild renal insufficiency: Creatinine is stable  Lipids: Has had significant dyslipidemia, treated with 80 mg Lipitor   Lab Results  Component Value Date   CHOL 138 07/15/2013   HDL 34.00* 07/15/2013   LDLCALC 70 07/15/2013   TRIG 172.0* 07/15/2013   CHOLHDL 4 07/15/2013   Has history of erectile dysfunction    He  has had mild symptoms of neuropathy and objective findings also   Examination:   BP 116/64 mmHg  Pulse 89  Temp(Src) 98 F (36.7 C)  Resp 14  Ht 5\' 10"  (1.778 m)  Wt 241 lb 12.8 oz (109.68 kg)  BMI 34.69 kg/m2  SpO2 95%  Body mass index is 34.69 kg/(m^2).    ASSESSMENT/ PLAN:   Diabetes type 2   Blood glucose control lately has been variable with some high  post new readings  but he has not monitored enough See history of present illness for current management and problems identified His main difficulty recently has been inconsistent diet and not controlling portions and sweets Weight has gone up another 2 pounds A1c is still over 7%, likely to be from higher postprandial readings He is exercising with walking 3 times a week  He will benefit from increasing his Victoza from 1.2 up to 1.8 mg, discussed possible nausea with this Also recommended more glucose monitoring  Renal dysfunction: His creatinine is againmildly increased, likely to be from vascular disease  Patient Instructions  Victoza 1.8mg  daily  Please check blood sugars at least half the time about 2 hours after any meal and 2-3  times per week on waking up. Please bring blood sugar monitor to each visit  [place] 03/11/2014, 10:34 AM

## 2014-06-08 ENCOUNTER — Other Ambulatory Visit (INDEPENDENT_AMBULATORY_CARE_PROVIDER_SITE_OTHER): Payer: Medicare HMO

## 2014-06-08 DIAGNOSIS — E1165 Type 2 diabetes mellitus with hyperglycemia: Secondary | ICD-10-CM

## 2014-06-08 DIAGNOSIS — IMO0002 Reserved for concepts with insufficient information to code with codable children: Secondary | ICD-10-CM

## 2014-06-08 DIAGNOSIS — R7989 Other specified abnormal findings of blood chemistry: Secondary | ICD-10-CM

## 2014-06-08 DIAGNOSIS — E781 Pure hyperglyceridemia: Secondary | ICD-10-CM

## 2014-06-08 LAB — COMPREHENSIVE METABOLIC PANEL
ALT: 16 U/L (ref 0–53)
AST: 13 U/L (ref 0–37)
Albumin: 3.8 g/dL (ref 3.5–5.2)
Alkaline Phosphatase: 110 U/L (ref 39–117)
BUN: 18 mg/dL (ref 6–23)
CALCIUM: 9.2 mg/dL (ref 8.4–10.5)
CHLORIDE: 103 meq/L (ref 96–112)
CO2: 31 mEq/L (ref 19–32)
Creatinine, Ser: 1.51 mg/dL — ABNORMAL HIGH (ref 0.40–1.50)
GFR: 58.07 mL/min — AB (ref >60.00)
Glucose, Bld: 164 mg/dL — ABNORMAL HIGH (ref 70–99)
Potassium: 4.2 mEq/L (ref 3.5–5.1)
SODIUM: 139 meq/L (ref 135–145)
Total Bilirubin: 0.3 mg/dL (ref 0.2–1.2)
Total Protein: 6.9 g/dL (ref 6.0–8.3)

## 2014-06-08 LAB — LDL CHOLESTEROL, DIRECT: Direct LDL: 114 mg/dL

## 2014-06-08 LAB — BASIC METABOLIC PANEL
BUN: 18 mg/dL (ref 6–23)
CO2: 31 mEq/L (ref 19–32)
Calcium: 9.2 mg/dL (ref 8.4–10.5)
Chloride: 103 mEq/L (ref 96–112)
Creatinine, Ser: 1.51 mg/dL — ABNORMAL HIGH (ref 0.40–1.50)
GFR: 58.07 mL/min — ABNORMAL LOW (ref >60.00)
GLUCOSE: 164 mg/dL — AB (ref 70–99)
Potassium: 4.2 mEq/L (ref 3.5–5.1)
SODIUM: 139 meq/L (ref 135–145)

## 2014-06-08 LAB — LIPID PANEL
CHOL/HDL RATIO: 6
Cholesterol: 210 mg/dL — ABNORMAL HIGH (ref 0–200)
HDL: 32.4 mg/dL — AB (ref >39.00)
NONHDL: 177.6
Triglycerides: 363 mg/dL — ABNORMAL HIGH (ref 0.0–149.0)
VLDL: 72.6 mg/dL — ABNORMAL HIGH (ref 0.0–40.0)

## 2014-06-08 LAB — HEMOGLOBIN A1C: HEMOGLOBIN A1C: 8.1 % — AB (ref 4.6–6.5)

## 2014-06-13 ENCOUNTER — Ambulatory Visit (INDEPENDENT_AMBULATORY_CARE_PROVIDER_SITE_OTHER): Payer: Medicare HMO | Admitting: Endocrinology

## 2014-06-13 ENCOUNTER — Encounter: Payer: Self-pay | Admitting: Endocrinology

## 2014-06-13 VITALS — BP 147/72 | HR 96 | Temp 98.2°F | Resp 14 | Ht 70.0 in | Wt 240.8 lb

## 2014-06-13 DIAGNOSIS — N182 Chronic kidney disease, stage 2 (mild): Secondary | ICD-10-CM

## 2014-06-13 DIAGNOSIS — IMO0002 Reserved for concepts with insufficient information to code with codable children: Secondary | ICD-10-CM

## 2014-06-13 DIAGNOSIS — E1165 Type 2 diabetes mellitus with hyperglycemia: Secondary | ICD-10-CM

## 2014-06-13 NOTE — Patient Instructions (Addendum)
Levemir 18 units, if am sugar stays > 140 go up to 20 units in a week  Victoza 1.8 mg daily   Please check blood sugars at least half the time about 2 hours after any meal and 3 times per week on waking up. Please bring blood sugar monitor to each visit. Recommended blood sugar levels about 2 hours after meal is 140-180 and on waking up 90-130

## 2014-06-13 NOTE — Progress Notes (Signed)
Patient ID: Eric Long, male   DOB: 1940-10-30, 74 y.o.   MRN: [id number]   Reason for Appointment: Diabetes follow-up   History of Present Illness   Diagnosis: Type 2 DIABETES MELITUS, date of diagnosis: 2006      Previous history: He was previously treated with metformin, Amaryl and Januvia but subsequently had poor control with A1c of 11.8 in 2011 and 11.6 in 7/13 In 8/13 is Januvia was stopped and he was started on Victoza and Lantus insulin With this his blood sugars were improved significantly and his Amaryl was stopped. He also started losing weight His A1c in 6/14 was excellent at 6.3  Recent history:  He was asked to take 1.8 mg Victoza on his last visit in 11/15 because of tendency to weight gain and postprandial hyperglycemia  He did not do so as he appears to be somewhat confused on his doses of various drugs. Also his insurance company has changed his Lantus to Levemir as a preferred insulin and he seems to be having higher fasting readings also Although his weight is leveled off his A1c is higher at 8.1% now Difficult to analyze his blood sugar patterns as he has not checked his blood sugar except once and is still not compliant with his instructions. Only recently has started an exercise program at the gym  A1c is still over 7% Fasting blood sugar is high in the lab also at 164 Has been compliant with his Levemir insulin and Victoza in the evening No hypoglycemia His metformin had been continued at 2 g a day.      Oral hypoglycemic drugs: Metformin 2 g daily     Side effects from medications: None Insulin regimen: Lantus 16 units at bedtime               Monitors blood glucose: Marland Kitchen    Glucometer: One Probation officer.          Blood Glucose readings from meter download:  Has only 1 readings in the last 4 weeks of 166         Physical activity: exercise: Walking or elliptical,  3-4/7 days a week           Dietician visit:  none, has seen CDE in 2/50         Complications: are: Peripheral vascular disease, neuropathy     Wt Readings from Last 3 Encounters:  06/13/14 240 lb 12.8 oz (109.226 kg)  03/11/14 241 lb 12.8 oz (109.68 kg)  10/21/13 239 lb 9.6 oz (108.682 kg)     LABS:  Lab Results  Component Value Date   HGBA1C 8.1* 06/08/2014   HGBA1C 7.3* 03/08/2014   HGBA1C 7.4* 10/21/2013   Lab Results  Component Value Date   MICROALBUR 0.6 10/21/2013   LDLCALC 70 07/15/2013   CREATININE 1.51* 06/08/2014   CREATININE 1.51* 06/08/2014   Appointment on 06/08/2014  Component Date Value Ref Range Status  . Sodium 06/08/2014 139  135 - 145 mEq/L Final  . Potassium 06/08/2014 4.2  3.5 - 5.1 mEq/L Final  . Chloride 06/08/2014 103  96 - 112 mEq/L Final  . CO2 06/08/2014 31  19 - 32 mEq/L Final  . Glucose, Bld 06/08/2014 164* 70 - 99 mg/dL Final  . BUN 06/08/2014 18  6 - 23 mg/dL Final  . Creatinine, Ser 06/08/2014 1.51* 0.40 - 1.50 mg/dL Final  . Calcium 06/08/2014 9.2  8.4 - 10.5 mg/dL Final  . GFR 06/08/2014 58.07* >60.00 mL/min Final  .  Hgb A1c MFr Bld 06/08/2014 8.1* 4.6 - 6.5 % Final   Glycemic Control Guidelines for People with Diabetes:Non Diabetic:  <6%Goal of Therapy: <7%Additional Action Suggested:  >8%   . Sodium 06/08/2014 139  135 - 145 mEq/L Final  . Potassium 06/08/2014 4.2  3.5 - 5.1 mEq/L Final  . Chloride 06/08/2014 103  96 - 112 mEq/L Final  . CO2 06/08/2014 31  19 - 32 mEq/L Final  . Glucose, Bld 06/08/2014 164* 70 - 99 mg/dL Final  . BUN 06/08/2014 18  6 - 23 mg/dL Final  . Creatinine, Ser 06/08/2014 1.51* 0.40 - 1.50 mg/dL Final  . Total Bilirubin 06/08/2014 0.3  0.2 - 1.2 mg/dL Final  . Alkaline Phosphatase 06/08/2014 110  39 - 117 U/L Final  . AST 06/08/2014 13  0 - 37 U/L Final  . ALT 06/08/2014 16  0 - 53 U/L Final  . Total Protein 06/08/2014 6.9  6.0 - 8.3 g/dL Final  . Albumin 06/08/2014 3.8  3.5 - 5.2 g/dL Final  . Calcium 06/08/2014 9.2  8.4 - 10.5 mg/dL Final  . GFR 06/08/2014 58.07* >60.00 mL/min  Final  . Cholesterol 06/08/2014 210* 0 - 200 mg/dL Final   ATP III Classification       Desirable:  < 200 mg/dL               Borderline High:  200 - 239 mg/dL          High:  > = 240 mg/dL  . Triglycerides 06/08/2014 363.0* 0.0 - 149.0 mg/dL Final   Normal:  <150 mg/dLBorderline High:  150 - 199 mg/dL  . HDL 06/08/2014 32.40* >39.00 mg/dL Final  . VLDL 06/08/2014 72.6* 0.0 - 40.0 mg/dL Final  . Total CHOL/HDL Ratio 06/08/2014 6   Final                  Men          Women1/2 Average Risk     3.4          3.3Average Risk          5.0          4.42X Average Risk          9.6          7.13X Average Risk          15.0          11.0                      . NonHDL 06/08/2014 177.60   Final   NOTE:  Non-HDL goal should be 30 mg/dL higher than patient's LDL goal (i.e. LDL goal of < 70 mg/dL, would have non-HDL goal of < 100 mg/dL)  . Direct LDL 06/08/2014 114.0   Final   Optimal:  <100 mg/dLNear or Above Optimal:  100-129 mg/dLBorderline High:  130-159 mg/dLHigh:  160-189 mg/dLVery High:  >190 mg/dL         Medication List       This list is accurate as of: 06/13/14  3:10 PM.  Always use your most recent med list.               amLODipine 5 MG tablet  Commonly known as:  NORVASC  Take 5 mg by mouth daily.     amlodipine-benazepril 2.5-10 MG per capsule  Commonly known as:  LOTREL     atorvastatin 80 MG tablet  Commonly known  as:  LIPITOR  Take 80 mg by mouth daily.     Insulin Detemir 100 UNIT/ML Pen  Commonly known as:  LEVEMIR FLEXTOUCH  Inject 16 Units into the skin daily.     Insulin Pen Needle 32G X 4 MM Misc  2 each by Does not apply route 2 (two) times daily.     LANTUS SOLOSTAR 100 UNIT/ML Solostar Pen  Generic drug:  Insulin Glargine  INJECT 16 UNITS UNDER THE SKIN ONCE DAILY.     Liraglutide 18 MG/3ML Sopn  Commonly known as:  VICTOZA  Inject 1.8 mg into the skin daily.     metFORMIN 500 MG tablet  Commonly known as:  GLUCOPHAGE  Take 2 tablets 2 times a day      metoprolol 50 MG tablet  Commonly known as:  LOPRESSOR  Take 50 mg by mouth 2 (two) times daily.     metoprolol succinate 25 MG 24 hr tablet  Commonly known as:  TOPROL-XL     ZOSTAVAX 56433 UNT/0.65ML injection  Generic drug:  zoster vaccine live (PF)        Allergies: No Known Allergies  No past medical history on file.  Past Surgical History  Procedure Laterality Date  . Femoral-femoral bypass graft Bilateral 2007    Family History  Problem Relation Age of Onset  . Diabetes Sister     Social History:  reports that he has quit smoking. He has never used smokeless tobacco. His alcohol and drug histories are not on file.  Review of Systems:  Hypertension:  well controlled usually, systolic readings relatively high today  Mild renal insufficiency: Creatinine is stable  Lipids: Has had significant dyslipidemia, treated with 80 mg Lipitor   Lab Results  Component Value Date   CHOL 210* 06/08/2014   HDL 32.40* 06/08/2014   LDLCALC 70 07/15/2013   LDLDIRECT 114.0 06/08/2014   TRIG 363.0* 06/08/2014   CHOLHDL 6 06/08/2014   Has history of erectile dysfunction    He  has had mild symptoms of neuropathy with objective findings, not very symptomatic now   Examination:   BP 147/72 mmHg  Pulse 96  Temp(Src) 98.2 F (36.8 C)  Resp 14  Ht 5\' 10"  (1.778 m)  Wt 240 lb 12.8 oz (109.226 kg)  BMI 34.55 kg/m2  SpO2 95%  Body mass index is 34.55 kg/(m^2).    ASSESSMENT/ PLAN:   Diabetes type 2   See history of present illness for current management and problems identified His A1c has increased significantly and not clear when his blood sugars are high as he does not monitor them Previously was having poor compliance with diet and likely postprandial hyperglycemia. He has also been taking only 1.2 mg Victoza instead of the recommended 1.8 on his last visit. Not clear if his Levemir is as effective as Lantus as his fasting blood sugar was also gone up with changing  the brand because of insurance preference.  Discussed that the need to improve his control with the following measures:  Increase Victoza to 1.8 mg, discussed possible nausea or diarrhea initially with this.  Increase Levemir by at least 2 units for now and possibly go up to 20 units in another week if fasting blood sugars are not at target  Discussed timing of glucose monitoring and need for regular testing along with blood sugar targets to be achieved  Continue exercise regimen which he has just started  More consistent diet  Consultation with dietitian for meal planning, discussed  need to do this periodically   Renal dysfunction: His creatinine is again mildly increased, likely to be from nephrosclerosis, however his creatinine clearance is over 50  Blood pressure will need to be followed and he will discuss with PCP also  HYPERLIPIDEMIA: His LDL is still relatively high, needs more consistent diet with low saturated fat, consider adding Zetia to his Lipitor   Patient Instructions  Levemir 18 units, if am sugar stays > 140 go up to 20 units in a week  Victoza 1.8 mg daily   Please check blood sugars at least half the time about 2 hours after any meal and 3 times per week on waking up. Please bring blood sugar monitor to each visit. Recommended blood sugar levels about 2 hours after meal is 140-180 and on waking up 90-130      Counseling time over 50% of today's 25 minute visit  Cipriana Biller 06/13/2014, 3:10 PM

## 2014-06-29 ENCOUNTER — Telehealth: Payer: Self-pay | Admitting: Endocrinology

## 2014-06-29 MED ORDER — METFORMIN HCL 500 MG PO TABS: ORAL_TABLET | ORAL | Status: DC

## 2014-06-29 MED ORDER — INSULIN DETEMIR 100 UNIT/ML FLEXPEN: 16.0000 [IU] | PEN_INJECTOR | Freq: Every day | SUBCUTANEOUS | Status: DC

## 2014-06-29 NOTE — Telephone Encounter (Signed)
He will need to get those prescriptions from PCP

## 2014-06-29 NOTE — Telephone Encounter (Signed)
Patient called stating he would like his Rx sent to his new pharmacy   Rx's: Lotrel Lipitor Levemir Metformin    Pharmacy: [facility] [address]  Thank you

## 2014-06-29 NOTE — Telephone Encounter (Signed)
See note below. Please advise if ok to refill lotrel and lipitorThese rx's are listed under a historical provider. Rx for Metformin and Levemir sent to pt's pharmacy.

## 2014-06-30 NOTE — Telephone Encounter (Signed)
Pt's wife advised of note below and voiced understanding.  

## 2014-07-01 ENCOUNTER — Encounter: Payer: Self-pay | Admitting: Endocrinology

## 2014-07-04 MED ORDER — LIRAGLUTIDE 18 MG/3ML ~~LOC~~ SOPN: 1.8000 mg | PEN_INJECTOR | Freq: Every day | SUBCUTANEOUS | Status: DC

## 2014-07-04 MED ORDER — INSULIN PEN NEEDLE 32G X 4 MM MISC: 2.0000 | Freq: Two times a day (BID) | Status: DC

## 2014-07-11 ENCOUNTER — Encounter: Payer: Medicare HMO | Attending: Endocrinology | Admitting: Skilled Nursing Facility1

## 2014-07-11 ENCOUNTER — Encounter: Payer: Self-pay | Admitting: Skilled Nursing Facility1

## 2014-07-11 VITALS — Ht 70.0 in | Wt 241.0 lb

## 2014-07-11 DIAGNOSIS — E1122 Type 2 diabetes mellitus with diabetic chronic kidney disease: Secondary | ICD-10-CM

## 2014-07-11 DIAGNOSIS — Z713 Dietary counseling and surveillance: Secondary | ICD-10-CM | POA: Insufficient documentation

## 2014-07-11 DIAGNOSIS — N189 Chronic kidney disease, unspecified: Secondary | ICD-10-CM | POA: Insufficient documentation

## 2014-07-11 DIAGNOSIS — Z794 Long term (current) use of insulin: Secondary | ICD-10-CM | POA: Insufficient documentation

## 2014-07-11 NOTE — Progress Notes (Signed)
Diabetes Self-Management Education  Visit Type:    Appt. Start Time:8:00  Appt. End Time: 9:30  07/11/2014  Mr. Eric Long, identified by name and date of birth, is a 74 y.o. male with a diagnosis of Diabetes: Type 2.  Other people present during visit:  Spouse/SO   ASSESSMENT  Height 5\' 10"  (1.778 m), weight 241 lb (109.317 kg). Body mass index is 34.58 kg/(m^2).   Patient seemed agitated from the beginning of the appointment to the end. Patient states he does not like vegetables and his wife states he is a very picky eater. 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 Patient states he gets diarrhea a lot after foods such as chicken. Patient did not seem to understand the gravity of his situation and said he would make changes but this dietitian is not sure. Patient drinks a lot of diet soda and eats lot of fast food which may be the cause of the diarrhea. Patient states he has had type 2 diabetes for 70 years (patient is 74 years old). Patient does not believe diet and and physical activity can manage his diabetes and keep him progressing in kidney failure. Patients cholesterol 210, triglycerides 363, and HDL 32.  Patients wife is very supportive but is frustrated with his lack of trying. Dietitian suggested he make an effort to try a new vegetable every week.   Initial Visit Information:  Are you currently following a meal plan?: No   Are you taking your medications as prescribed?: Yes Are you checking your feet?: Yes How many days per week are you checking your feet?: 7 How often do you need to have someone help you when you read instructions, pamphlets, or other written materials from your doctor or pharmacy?: 1 - Never    Psychosocial:     Patient Belief/Attitude about Diabetes: Defeat/Burnout Self-care barriers: None Self-management support: Family Other persons present: Spouse/SO Patient Concerns: Weight Control Special Needs:  None Preferred Learning Style: Visual, Auditory Learning Readiness: Contemplating  Complications:   Last HgB A1C per patient/outside source: 8.1 mg/dL How often do you check your blood sugar?:  (three times a week) Fasting Blood glucose range (mg/dL): 130-179 Postprandial Blood glucose range (mg/dL): 70-129 Number of hypoglycemic episodes per month: 0 Number of hyperglycemic episodes per week: 0 Have you had a dilated eye exam in the past 12 months?: Yes Have you had a dental exam in the past 12 months?: No  Diet Intake:  Breakfast: coffee, 1 pack oatmeal, 2 slices of toast with margarine Snack (morning): cheese doodles, potato chips, ice cream Lunch: 2 bologna and cheese sandwhiches on pumpernickle Snack (afternoon): cheese doodles, potato chips, ice cream Dinner: quarter pounder cheese burger, fries, applepie Snack (evening): cheese doodles, potato chips, ice cream Beverage(s): orange juice, diet soda,   Exercise:  Exercise: ADL's  Individualized Plan for Diabetes Self-Management Training:   Learning Objective:  Patient will have a greater understanding of diabetes self-management.  Patient education plan per assessed needs and concerns is to attend individual sessions for     Education Topics Reviewed with Patient Today:  Definition of diabetes, type 1 and 2, and the diagnosis of diabetes, Factors that contribute to the development of diabetes Role of diet in the treatment of diabetes and the relationship between the three main macronutrients and blood glucose level, Food label reading, portion sizes and measuring food., Carbohydrate counting Role of exercise on diabetes management, blood pressure control and cardiac health.  Daily foot exams   Relationship between chronic complications and blood glucose control, Lipid levels, blood glucose control and heart disease, Identified and discussed with patient  current chronic complications        PATIENTS GOALS/Plan  (Developed by the patient):  Nutrition: Follow meal plan discussed, General guidelines for healthy choices and portions discussed Physical Activity:  (Be physically active every day) Medications: take my medication as prescribed  Plan:   There are no Patient Instructions on file for this visit.  Expected Outcomes:  Demonstrated limited interest in learning.  Expect minimal changes  Education material provided: Living Well with Diabetes, Meal plan card and Snack sheet  If problems or questions, patient to contact team via:  Phone  Future DSME appointment: PRN

## 2014-07-26 LAB — HM DIABETES EYE EXAM

## 2014-08-19 ENCOUNTER — Encounter: Payer: Self-pay | Admitting: *Deleted

## 2014-09-07 ENCOUNTER — Other Ambulatory Visit (INDEPENDENT_AMBULATORY_CARE_PROVIDER_SITE_OTHER): Payer: Medicare HMO

## 2014-09-07 DIAGNOSIS — E1165 Type 2 diabetes mellitus with hyperglycemia: Secondary | ICD-10-CM | POA: Diagnosis not present

## 2014-09-07 DIAGNOSIS — IMO0002 Reserved for concepts with insufficient information to code with codable children: Secondary | ICD-10-CM

## 2014-09-07 LAB — BASIC METABOLIC PANEL
BUN: 16 mg/dL (ref 6–23)
CHLORIDE: 100 meq/L (ref 96–112)
CO2: 28 meq/L (ref 19–32)
CREATININE: 1.46 mg/dL (ref 0.40–1.50)
Calcium: 9.5 mg/dL (ref 8.4–10.5)
GFR: 60.33 mL/min (ref >60.00)
GLUCOSE: 139 mg/dL — AB (ref 70–99)
POTASSIUM: 3.9 meq/L (ref 3.5–5.1)
Sodium: 137 mEq/L (ref 135–145)

## 2014-09-07 LAB — HEMOGLOBIN A1C: Hgb A1c MFr Bld: 6.7 % — ABNORMAL HIGH (ref 4.6–6.5)

## 2014-09-08 LAB — FRUCTOSAMINE: FRUCTOSAMINE: 245 umol/L (ref 0–285)

## 2014-09-12 ENCOUNTER — Ambulatory Visit (INDEPENDENT_AMBULATORY_CARE_PROVIDER_SITE_OTHER): Payer: Medicare HMO | Admitting: Endocrinology

## 2014-09-12 ENCOUNTER — Other Ambulatory Visit: Payer: Self-pay | Admitting: *Deleted

## 2014-09-12 ENCOUNTER — Encounter: Payer: Self-pay | Admitting: Endocrinology

## 2014-09-12 ENCOUNTER — Telehealth: Payer: Self-pay | Admitting: Endocrinology

## 2014-09-12 VITALS — BP 138/66 | HR 107 | Temp 98.3°F | Resp 16 | Ht 70.0 in | Wt 236.8 lb

## 2014-09-12 DIAGNOSIS — E785 Hyperlipidemia, unspecified: Secondary | ICD-10-CM

## 2014-09-12 DIAGNOSIS — E119 Type 2 diabetes mellitus without complications: Secondary | ICD-10-CM | POA: Diagnosis not present

## 2014-09-12 MED ORDER — METFORMIN HCL 500 MG PO TABS
ORAL_TABLET | ORAL | Status: DC
Start: 2014-09-12 — End: 2017-07-21

## 2014-09-12 NOTE — Progress Notes (Addendum)
Patient ID: Eric Long, male   DOB: 1940-10-13, 74 y.o.   MRN: [id number]   Reason for Appointment: Diabetes follow-up   History of Present Illness   Diagnosis: Type 2 DIABETES MELITUS, date of diagnosis: 2006      Previous history: He was previously treated with metformin, Amaryl and Januvia but subsequently had poor control with A1c of 11.8 in 2011 and 11.6 in 7/13 In 8/13 is Januvia was stopped and he was started on Victoza and Lantus insulin With this his blood sugars were improved significantly and his Amaryl was stopped. He also started losing weight His A1c in 6/14 was excellent at 6.3  Recent history:  He was asked to take 1.8 mg Victoza on his last visit in 2/16 because of tendency to weight gain and higher A1c He was also having postprandial hyperglycemia  He has been overall much more motivated to do various things to help his control especially after seeing the dietitian in 3/16 He has been exercising regularly Has lost weight Blood sugars at home are excellent usually but has not checked any readings after supper, only occasionally higher after breakfast Not clear if he is taking Lantus or Levemir, has been switched to Levemir earlier this year and the dose was increased by 2 units No side effects from 1.8 Victoza No hypoglycemia His metformin had been continued at 2 g a day.      Oral hypoglycemic drugs: Metformin 2 g daily     Side effects from medications: None Insulin regimen: Levemir 18 units at bedtime               Monitors blood glucose: Marland Kitchen    Glucometer: One [employer].          Blood Glucose readings from meter download:   PRE-MEAL Breakfast Lunch Dinner Bedtime Overall  Glucose range:  95-137       Mean/median:  120      128/129    POST-MEAL PC Breakfast PC Lunch PC Dinner  Glucose range:  154, 174   91-157  ?    Mean/median:             Physical activity: exercise: Walking or elliptical,  3-4/7 days a week           Dietician visit:  07/2014, has  seen CDE in 4/09        Complications: are: Peripheral vascular disease, neuropathy     Wt Readings from Last 3 Encounters:  09/12/14 236 lb 12.8 oz (107.412 kg)  07/11/14 241 lb (109.317 kg)  06/13/14 240 lb 12.8 oz (109.226 kg)     LABS:  Lab Results  Component Value Date   HGBA1C 6.7* 09/07/2014   HGBA1C 8.1* 06/08/2014   HGBA1C 7.3* 03/08/2014   Lab Results  Component Value Date   MICROALBUR 0.6 10/21/2013   LDLCALC 70 07/15/2013   CREATININE 1.46 09/07/2014   Lab on 09/07/2014  Component Date Value Ref Range Status  . Hgb A1c MFr Bld 09/07/2014 6.7* 4.6 - 6.5 % Final   Glycemic Control Guidelines for People with Diabetes:Non Diabetic:  <6%Goal of Therapy: <7%Additional Action Suggested:  >8%   . Sodium 09/07/2014 137  135 - 145 mEq/L Final  . Potassium 09/07/2014 3.9  3.5 - 5.1 mEq/L Final  . Chloride 09/07/2014 100  96 - 112 mEq/L Final  . CO2 09/07/2014 28  19 - 32 mEq/L Final  . Glucose, Bld 09/07/2014 139* 70 - 99 mg/dL Final  .  BUN 09/07/2014 16  6 - 23 mg/dL Final  . Creatinine, Ser 09/07/2014 1.46  0.40 - 1.50 mg/dL Final  . Calcium 09/07/2014 9.5  8.4 - 10.5 mg/dL Final  . GFR 09/07/2014 60.33  >60.00 mL/min Final  . Fructosamine 09/07/2014 245  0 - 285 umol/L Final   Comment: Published reference interval for apparently healthy subjects between age 45 and 90 is 16 - 285 umol/L and in a poorly controlled diabetic population is 228 - 563 umol/L with a mean of 396 umol/L.          Medication List       This list is accurate as of: 09/12/14  8:44 AM.  Always use your most recent med list.               amLODipine 5 MG tablet  Commonly known as:  NORVASC  Take 5 mg by mouth daily.     amlodipine-benazepril 2.5-10 MG per capsule  Commonly known as:  LOTREL     atorvastatin 80 MG tablet  Commonly known as:  LIPITOR  Take 80 mg by mouth daily.     Insulin Detemir 100 UNIT/ML Pen  Commonly known as:  LEVEMIR FLEXTOUCH  Inject 16-18 Units into  the skin daily.     Insulin Pen Needle 32G X 4 MM Misc  2 each by Does not apply route 2 (two) times daily.     LANTUS SOLOSTAR 100 UNIT/ML Solostar Pen  Generic drug:  Insulin Glargine  INJECT 16 UNITS UNDER THE SKIN ONCE DAILY.     Liraglutide 18 MG/3ML Sopn  Commonly known as:  VICTOZA  Inject 0.3 mLs (1.8 mg total) into the skin daily.     metFORMIN 500 MG tablet  Commonly known as:  GLUCOPHAGE  Take 2 tablets 2 times a day     metoprolol 50 MG tablet  Commonly known as:  LOPRESSOR  Take 50 mg by mouth daily.     metoprolol succinate 25 MG 24 hr tablet  Commonly known as:  TOPROL-XL     ZOSTAVAX 01093 UNT/0.65ML injection  Generic drug:  zoster vaccine live (PF)        Allergies: No Known Allergies  No past medical history on file.  Past Surgical History  Procedure Laterality Date  . Femoral-femoral bypass graft Bilateral 2007    Family History  Problem Relation Age of Onset  . Diabetes Sister     Social History:  reports that he has quit smoking. He has never used smokeless tobacco. His alcohol and drug histories are not on file.  Review of Systems:  Hypertension:  well controlled usually, systolic readings relatively better today  Mild renal insufficiency: Creatinine is stable  Lab Results  Component Value Date   CREATININE 1.46 09/07/2014   BUN 16 09/07/2014   NA 137 09/07/2014   K 3.9 09/07/2014   CL 100 09/07/2014   CO2 28 09/07/2014     Lipids: Has had significant dyslipidemia, treated with 80 mg Lipitor and followed by PCP  Lab Results  Component Value Date   CHOL 210* 06/08/2014   HDL 32.40* 06/08/2014   LDLCALC 70 07/15/2013   LDLDIRECT 114.0 06/08/2014   TRIG 363.0* 06/08/2014   CHOLHDL 6 06/08/2014   Has history of erectile dysfunction    He  has had mild symptoms of neuropathy, none recently   Examination:   BP 138/66 mmHg  Pulse 107  Temp(Src) 98.3 F (36.8 C)  Resp 16  Ht 5'  10" (1.778 m)  Wt 236 lb 12.8 oz  (107.412 kg)  BMI 33.98 kg/m2  SpO2 96%  Body mass index is 33.98 kg/(m^2).    ASSESSMENT/ PLAN:   Diabetes type 2   See history of present illness for current management and problems identified His A1c has improved significantly last visit with increasing his Victoza as well as his starting to exercise regularly Also has made some changes in diet after discussion with dietitian. Overall is more motivated and has check blood sugars more often; he wants to try and lose more weight  For now we'll continue the same regimen Reminded him to check more readings after meals especially supper  Renal dysfunction: His creatinine is high normal now and slightly better This is likely to be from nephrosclerosis,  his creatinine clearance is over 50  Blood pressure will need to be followed   HYPERLIPIDEMIA: His fasting lipids will need to be rechecked on the next visit May have had some improvement with weight loss and improved diet  There are no Patient Instructions on file for this visit.  Ysela Hettinger 09/12/2014, 8:44 AM   He is on Lantus

## 2014-09-12 NOTE — Patient Instructions (Signed)
Please check blood sugars at least half the time about 2 hours after any meal and 3 times per week on waking up.  Please bring blood sugar monitor to each visit.  Recommended blood sugar levels about 2 hours after meal is 140-180 and on waking up 90-130   

## 2014-09-12 NOTE — Telephone Encounter (Signed)
Patient need refill of metformin [facility] [address]

## 2014-09-12 NOTE — Telephone Encounter (Signed)
lmtcb with information

## 2014-09-12 NOTE — Telephone Encounter (Signed)
Please refill and also verify whether he is taking Levemir or Lantus

## 2014-09-16 ENCOUNTER — Other Ambulatory Visit: Payer: Self-pay | Admitting: *Deleted

## 2014-09-16 NOTE — Telephone Encounter (Signed)
Patient is taking 18 units of lantus

## 2014-09-16 NOTE — Addendum Note (Signed)
Addended by: Elayne Snare on: 09/16/2014 05:07 PM   Modules accepted: Orders, Medications

## 2014-10-31 ENCOUNTER — Other Ambulatory Visit: Payer: Self-pay

## 2014-11-04 ENCOUNTER — Other Ambulatory Visit: Payer: Self-pay | Admitting: Endocrinology

## 2014-12-12 ENCOUNTER — Other Ambulatory Visit (INDEPENDENT_AMBULATORY_CARE_PROVIDER_SITE_OTHER): Payer: Medicare HMO

## 2014-12-12 DIAGNOSIS — E785 Hyperlipidemia, unspecified: Secondary | ICD-10-CM | POA: Diagnosis not present

## 2014-12-12 DIAGNOSIS — E119 Type 2 diabetes mellitus without complications: Secondary | ICD-10-CM | POA: Diagnosis not present

## 2014-12-12 LAB — LIPID PANEL
CHOLESTEROL: 241 mg/dL — AB (ref 0–200)
HDL: 37.3 mg/dL — AB (ref >39.00)
LDL CALC: 168 mg/dL — AB (ref 0–99)
NonHDL: 203.62
TRIGLYCERIDES: 176 mg/dL — AB (ref 0.0–149.0)
Total CHOL/HDL Ratio: 6
VLDL: 35.2 mg/dL (ref 0.0–40.0)

## 2014-12-12 LAB — BASIC METABOLIC PANEL
BUN: 14 mg/dL (ref 6–23)
CO2: 29 meq/L (ref 19–32)
Calcium: 8.9 mg/dL (ref 8.4–10.5)
Chloride: 104 mEq/L (ref 96–112)
Creatinine, Ser: 1.38 mg/dL (ref 0.40–1.50)
GFR: 64.34 mL/min (ref >60.00)
GLUCOSE: 124 mg/dL — AB (ref 70–99)
POTASSIUM: 4 meq/L (ref 3.5–5.1)
Sodium: 140 mEq/L (ref 135–145)

## 2014-12-12 LAB — HEMOGLOBIN A1C: Hgb A1c MFr Bld: 7.2 % — ABNORMAL HIGH (ref 4.6–6.5)

## 2014-12-15 ENCOUNTER — Ambulatory Visit (INDEPENDENT_AMBULATORY_CARE_PROVIDER_SITE_OTHER): Payer: Medicare HMO | Admitting: Endocrinology

## 2014-12-15 ENCOUNTER — Encounter: Payer: Self-pay | Admitting: Endocrinology

## 2014-12-15 VITALS — BP 164/70 | HR 95 | Temp 97.8°F | Resp 16 | Ht 70.0 in | Wt 239.2 lb

## 2014-12-15 DIAGNOSIS — E1165 Type 2 diabetes mellitus with hyperglycemia: Secondary | ICD-10-CM | POA: Diagnosis not present

## 2014-12-15 DIAGNOSIS — IMO0002 Reserved for concepts with insufficient information to code with codable children: Secondary | ICD-10-CM

## 2014-12-15 DIAGNOSIS — E785 Hyperlipidemia, unspecified: Secondary | ICD-10-CM

## 2014-12-15 DIAGNOSIS — N182 Chronic kidney disease, stage 2 (mild): Secondary | ICD-10-CM | POA: Diagnosis not present

## 2014-12-15 NOTE — Progress Notes (Signed)
Patient ID: Eric Long, male   DOB: Jan 16, 1941, 74 y.o.   MRN: [id number]   Reason for Appointment: Diabetes follow-up   History of Present Illness   Diagnosis: Type 2 DIABETES MELITUS, date of diagnosis: 2006      Previous history: He was previously treated with metformin, Amaryl and Januvia but subsequently had poor control with A1c of 11.8 in 2011 and 11.6 in 7/13 In 8/13 is Januvia was stopped and he was started on Victoza and Lantus insulin With this his blood sugars were improved significantly and his Amaryl was stopped. He also started losing weight His A1c in 6/14 was excellent at 6.3  Recent history:   Although he had better control as last visit with A1c 6.7 his A1c has gone up to 7.2 He has not been as motivated as on his last visit to follow his diet and his weight has gone back up a little Previously had seen the dietitian He continues to take the same doses of Victoza and Lantus He has had a couple of significantly high readings which he thinks are from not watching his diet However checking blood sugar relatively infrequently anyway His metformin had been continued at 2 g a day.      Oral hypoglycemic drugs: Metformin 2 g daily     Side effects from medications: None Insulin regimen: Levemir 18 units at bedtime               Monitors blood glucose: Marland Kitchen    Glucometer: One [employer].          Blood Glucose readings from meter download:   PRE-MEAL Breakfast Lunch Dinner Bedtime Overall  Glucose range:  110-188   105-141    Mean/median:  139 221   139    Physical activity: exercise: Walking or elliptical,  3-4/7 days a week           Dietician visit:  07/2014, has seen CDE in 4/09        Complications: are: Peripheral vascular disease, neuropathy     Wt Readings from Last 3 Encounters:  12/15/14 239 lb 3.2 oz (108.5 kg)  09/12/14 236 lb 12.8 oz (107.412 kg)  07/11/14 241 lb (109.317 kg)     LABS:  Lab Results  Component Value Date   HGBA1C 7.2* 12/12/2014    HGBA1C 6.7* 09/07/2014   HGBA1C 8.1* 06/08/2014   Lab Results  Component Value Date   MICROALBUR 0.6 10/21/2013   LDLCALC 168* 12/12/2014   CREATININE 1.38 12/12/2014   Lab on 12/12/2014  Component Date Value Ref Range Status  . Hgb A1c MFr Bld 12/12/2014 7.2* 4.6 - 6.5 % Final   Glycemic Control Guidelines for People with Diabetes:Non Diabetic:  <6%Goal of Therapy: <7%Additional Action Suggested:  >8%   . Sodium 12/12/2014 140  135 - 145 mEq/L Final  . Potassium 12/12/2014 4.0  3.5 - 5.1 mEq/L Final  . Chloride 12/12/2014 104  96 - 112 mEq/L Final  . CO2 12/12/2014 29  19 - 32 mEq/L Final  . Glucose, Bld 12/12/2014 124* 70 - 99 mg/dL Final  . BUN 12/12/2014 14  6 - 23 mg/dL Final  . Creatinine, Ser 12/12/2014 1.38  0.40 - 1.50 mg/dL Final  . Calcium 12/12/2014 8.9  8.4 - 10.5 mg/dL Final  . GFR 12/12/2014 64.34  >60.00 mL/min Final  . Cholesterol 12/12/2014 241* 0 - 200 mg/dL Final   ATP III Classification       Desirable:  <  200 mg/dL               Borderline High:  200 - 239 mg/dL          High:  > = 240 mg/dL  . Triglycerides 12/12/2014 176.0* 0.0 - 149.0 mg/dL Final   Normal:  <150 mg/dLBorderline High:  150 - 199 mg/dL  . HDL 12/12/2014 37.30* >39.00 mg/dL Final  . VLDL 12/12/2014 35.2  0.0 - 40.0 mg/dL Final  . LDL Cholesterol 12/12/2014 168* 0 - 99 mg/dL Final  . Total CHOL/HDL Ratio 12/12/2014 6   Final                  Men          Women1/2 Average Risk     3.4          3.3Average Risk          5.0          4.42X Average Risk          9.6          7.13X Average Risk          15.0          11.0                      . NonHDL 12/12/2014 203.62   Final   NOTE:  Non-HDL goal should be 30 mg/dL higher than patient's LDL goal (i.e. LDL goal of < 70 mg/dL, would have non-HDL goal of < 100 mg/dL)         Medication List       This list is accurate as of: 12/15/14 11:59 PM.  Always use your most recent med list.               amlodipine-benazepril 2.5-10 MG per  capsule  Commonly known as:  LOTREL     Insulin Pen Needle 32G X 4 MM Misc  2 each by Does not apply route 2 (two) times daily.     LANTUS SOLOSTAR 100 UNIT/ML Solostar Pen  Generic drug:  Insulin Glargine  INJECT 16 UNITS UNDER THE SKIN ONCE DAILY.     metFORMIN 500 MG tablet  Commonly known as:  GLUCOPHAGE  Take 2 tablets 2 times a day     metoprolol succinate 25 MG 24 hr tablet  Commonly known as:  TOPROL-XL     rosuvastatin 40 MG tablet  Commonly known as:  CRESTOR     VICTOZA 18 MG/3ML Sopn  Generic drug:  Liraglutide  INJECT 0.3 ML (1.8 MG TOTAL) INTO THE SKIN DAILY     ZOSTAVAX 40102 UNT/0.65ML injection  Generic drug:  zoster vaccine live (PF)        Allergies: No Known Allergies  No past medical history on file.  Past Surgical History  Procedure Laterality Date  . Femoral-femoral bypass graft Bilateral 2007    Family History  Problem Relation Age of Onset  . Diabetes Sister     Social History:  reports that he has quit smoking. He has never used smokeless tobacco. His alcohol and drug histories are not on file.  Review of Systems:  Hypertension:  well controlled usually, systolic readings relatively high today but blood pressure was 138/54 with his PCP recently on his annual physical  Mild renal insufficiency: Creatinine is stable  Lab Results  Component Value Date   CREATININE 1.38 12/12/2014   BUN 14 12/12/2014   NA  140 12/12/2014   K 4.0 12/12/2014   CL 104 12/12/2014   CO2 29 12/12/2014     Lipids: Has had significant dyslipidemia,and recently is Lipitor was changed to Crestor 40 mg daily but his lipids have not improved yet   Lab Results  Component Value Date   CHOL 241* 12/12/2014   HDL 37.30* 12/12/2014   LDLCALC 168* 12/12/2014   LDLDIRECT 114.0 06/08/2014   TRIG 176.0* 12/12/2014   CHOLHDL 6 12/12/2014        Examination:   BP 164/70 mmHg  Pulse 95  Temp(Src) 97.8 F (36.6 C)  Resp 16  Ht 5\' 10"  (1.778 m)  Wt 239  lb 3.2 oz (108.5 kg)  BMI 34.32 kg/m2  SpO2 96%  Body mass index is 34.32 kg/(m^2).    ASSESSMENT/ PLAN:   Diabetes type 2   See history of present illness for current management and problems identified His A1c has gone up compared to his last visit and he has had variable blood sugars recently Since he thinks that he has not been consistent with his diet and his most recent blood sugars have been somewhat better may benefit from better compliance without changing his regimen  Since his last fasting blood sugar is 110 will not increase his Lantus as yet Also continues on maximum dose of Victoza  Reminded him to check more readings overall and also after meals especially supper which he is not doing now  Renal dysfunction: His creatinine is high normal consistently This is likely to be from nephrosclerosis,  his creatinine clearance is over 50  Blood pressure will need to be followed by PCP. Blood pressure was reportedly not as high with PCP  HYPERLIPIDEMIA: His fasting lipids will need to be rechecked on the next visit again May consider adding Zetia if lipids are not well-controlled  Patient Instructions  Check blood sugars on waking up .Marland Kitchen 2-3 .Marland Kitchen times a week Also check blood sugars about 2 hours after a meal and do this after different meals by rotation  Recommended blood sugar levels on waking up is 90-130 and about 2 hours after meal is 140-180 Please bring blood sugar monitor to each visit.     Tersa Fotopoulos 12/18/2014, 8:42 PM   He is on Lantus

## 2014-12-15 NOTE — Patient Instructions (Signed)
Check blood sugars on waking up .. 2-3 .. times a week  Also check blood sugars about 2 hours after a meal and do this after different meals by rotation  Recommended blood sugar levels on waking up is 90-130 and about 2 hours after meal is 140-180 Please bring blood sugar monitor to each visit.  

## 2015-02-07 DIAGNOSIS — H40023 Open angle with borderline findings, high risk, bilateral: Secondary | ICD-10-CM | POA: Diagnosis not present

## 2015-02-07 DIAGNOSIS — H524 Presbyopia: Secondary | ICD-10-CM | POA: Diagnosis not present

## 2015-02-07 DIAGNOSIS — H1851 Endothelial corneal dystrophy: Secondary | ICD-10-CM | POA: Diagnosis not present

## 2015-02-07 DIAGNOSIS — E119 Type 2 diabetes mellitus without complications: Secondary | ICD-10-CM | POA: Diagnosis not present

## 2015-02-07 DIAGNOSIS — H5203 Hypermetropia, bilateral: Secondary | ICD-10-CM | POA: Diagnosis not present

## 2015-02-07 DIAGNOSIS — H2513 Age-related nuclear cataract, bilateral: Secondary | ICD-10-CM | POA: Diagnosis not present

## 2015-03-13 DIAGNOSIS — R195 Other fecal abnormalities: Secondary | ICD-10-CM | POA: Diagnosis not present

## 2015-03-14 ENCOUNTER — Other Ambulatory Visit (INDEPENDENT_AMBULATORY_CARE_PROVIDER_SITE_OTHER): Payer: Medicare HMO

## 2015-03-14 DIAGNOSIS — IMO0002 Reserved for concepts with insufficient information to code with codable children: Secondary | ICD-10-CM

## 2015-03-14 DIAGNOSIS — E1165 Type 2 diabetes mellitus with hyperglycemia: Secondary | ICD-10-CM

## 2015-03-14 DIAGNOSIS — E785 Hyperlipidemia, unspecified: Secondary | ICD-10-CM | POA: Diagnosis not present

## 2015-03-14 LAB — COMPREHENSIVE METABOLIC PANEL
ALT: 19 U/L (ref 0–53)
AST: 18 U/L (ref 0–37)
Albumin: 4 g/dL (ref 3.5–5.2)
Alkaline Phosphatase: 111 U/L (ref 39–117)
BUN: 15 mg/dL (ref 6–23)
CALCIUM: 9.8 mg/dL (ref 8.4–10.5)
CHLORIDE: 99 meq/L (ref 96–112)
CO2: 30 meq/L (ref 19–32)
CREATININE: 1.39 mg/dL (ref 0.40–1.50)
GFR: 63.76 mL/min (ref >60.00)
Glucose, Bld: 84 mg/dL (ref 70–99)
POTASSIUM: 4 meq/L (ref 3.5–5.1)
SODIUM: 138 meq/L (ref 135–145)
Total Bilirubin: 0.3 mg/dL (ref 0.2–1.2)
Total Protein: 7.7 g/dL (ref 6.0–8.3)

## 2015-03-14 LAB — LIPID PANEL
CHOL/HDL RATIO: 6
Cholesterol: 227 mg/dL — ABNORMAL HIGH (ref 0–200)
HDL: 37 mg/dL — ABNORMAL LOW (ref >39.00)
NONHDL: 189.95
TRIGLYCERIDES: 204 mg/dL — AB (ref 0.0–149.0)
VLDL: 40.8 mg/dL — AB (ref 0.0–40.0)

## 2015-03-14 LAB — LDL CHOLESTEROL, DIRECT: Direct LDL: 159 mg/dL

## 2015-03-14 LAB — HEMOGLOBIN A1C: Hgb A1c MFr Bld: 7.1 % — ABNORMAL HIGH (ref 4.6–6.5)

## 2015-03-17 ENCOUNTER — Encounter: Payer: Self-pay | Admitting: Endocrinology

## 2015-03-17 ENCOUNTER — Ambulatory Visit (INDEPENDENT_AMBULATORY_CARE_PROVIDER_SITE_OTHER): Payer: Medicare HMO | Admitting: Endocrinology

## 2015-03-17 VITALS — BP 138/62 | HR 108 | Temp 98.6°F | Resp 16 | Ht 70.0 in | Wt 237.6 lb

## 2015-03-17 DIAGNOSIS — Z794 Long term (current) use of insulin: Secondary | ICD-10-CM | POA: Diagnosis not present

## 2015-03-17 DIAGNOSIS — E785 Hyperlipidemia, unspecified: Secondary | ICD-10-CM | POA: Diagnosis not present

## 2015-03-17 DIAGNOSIS — E1165 Type 2 diabetes mellitus with hyperglycemia: Secondary | ICD-10-CM

## 2015-03-17 LAB — URINALYSIS, ROUTINE W REFLEX MICROSCOPIC
Hgb urine dipstick: NEGATIVE
Leukocytes, UA: NEGATIVE
NITRITE: NEGATIVE
Total Protein, Urine: NEGATIVE
URINE GLUCOSE: NEGATIVE
UROBILINOGEN UA: 0.2 (ref 0.0–1.0)
pH: 5.5 (ref 5.0–8.0)

## 2015-03-17 LAB — MICROALBUMIN / CREATININE URINE RATIO
Creatinine,U: 371.8 mg/dL
MICROALB/CREAT RATIO: 0.8 mg/g (ref 0.0–30.0)
Microalb, Ur: 3.1 mg/dL — ABNORMAL HIGH (ref 0.0–1.9)

## 2015-03-17 MED ORDER — EZETIMIBE 10 MG PO TABS: 10.0000 mg | ORAL_TABLET | Freq: Every day | ORAL | Status: DC

## 2015-03-17 NOTE — Progress Notes (Signed)
Patient ID: Eric Long, male   DOB: [birth year], 74 y.o.   MRN: [id number]   Reason for Appointment: Diabetes follow-up   History of Present Illness   Diagnosis: Type 2 DIABETES MELITUS, date of diagnosis: 2006      Previous history: He was previously treated with metformin, Amaryl and Januvia but subsequently had poor control with A1c of 11.8 in 2011 and 11.6 in 7/13 In 8/13 is Januvia was stopped and he was started on Victoza and Lantus insulin With this his blood sugars were improved significantly and his Amaryl was stopped. He also started losing weight His A1c in 6/14 was excellent at 6.3  Recent history:   Insulin regimen: Levemir 18 units at bedtime          Non-insulin hypoglycemic drugs: Metformin 2 g daily, Victoza 1.8 mg daily        Although he had better control in May when his A1c was 6.7 it is now staying over 7% He has difficulty remembering to check his blood sugar margin difficult to know what his blood sugar patterns are2 postprandial Fasting blood sugars are variable, better today and also in the lab Mildly increased blood sugars at lunchtime and the one reading on his meter at bedtime He does try to exercise but it is mostly weights Not consistent with diet and not able to lose weight despite previous consultation with dietitian     Side effects from medications: None       Monitors blood glucose: Marland Kitchen    Glucometer: One [employer].          Blood Glucose readings from meter download:   Mean values apply above for all meters except median for One Touch  PRE-MEAL Fasting Lunch Dinner Bedtime Overall  Glucose range:  96-151   155    159    Mean/median:      144     Physical activity: exercise: Walking or weights about 3 days a week   Dietician visit:  07/2014, has seen CDE in [id number]        Complications: are: Peripheral vascular disease, neuropathy     Wt Readings from Last 3 Encounters:  03/17/15 237 lb 9.6 oz (107.775 kg)  12/15/14 239 lb  3.2 oz (108.5 kg)  09/12/14 236 lb 12.8 oz (107.412 kg)     LABS:  Lab Results  Component Value Date   HGBA1C 7.1* 03/14/2015   HGBA1C 7.2* 12/12/2014   HGBA1C 6.7* 09/07/2014   Lab Results  Component Value Date   MICROALBUR 3.1* 03/17/2015   LDLCALC 168* 12/12/2014   CREATININE 1.39 03/14/2015   Office Visit on 03/17/2015  Component Date Value Ref Range Status  . Microalb, Ur 03/17/2015 3.1* 0.0 - 1.9 mg/dL Final  . Creatinine,U 03/17/2015 371.8   Final  . Microalb Creat Ratio 03/17/2015 0.8  0.0 - 30.0 mg/g Final  . Color, Urine 03/17/2015 YELLOW  Yellow;Lt. Yellow Final  . APPearance 03/17/2015 CLEAR  Clear Final  . Specific Gravity, Urine 03/17/2015 >=1.030* 1.000 - 1.030 Final  . pH 03/17/2015 5.5  5.0 - 8.0 Final  . Total Protein, Urine 03/17/2015 NEGATIVE  Negative Final  . Urine Glucose 03/17/2015 NEGATIVE  Negative Final  . Ketones, ur 03/17/2015 TRACE* Negative Final  . Bilirubin Urine 03/17/2015 SMALL* Negative Final  . Hgb urine dipstick 03/17/2015 NEGATIVE  Negative Final  . Urobilinogen, UA 03/17/2015 0.2  0.0 - 1.0 Final  . Leukocytes, UA 03/17/2015 NEGATIVE  Negative Final  .  Nitrite 03/17/2015 NEGATIVE  Negative Final  . WBC, UA 03/17/2015 0-2/hpf  0-2/hpf Final  . RBC / HPF 03/17/2015 0-2/hpf  0-2/hpf Final  . Squamous Epithelial / LPF 03/17/2015 Rare(0-4/hpf)  Rare(0-4/hpf) Final  Appointment on 03/14/2015  Component Date Value Ref Range Status  . Hgb A1c MFr Bld 03/14/2015 7.1* 4.6 - 6.5 % Final   Glycemic Control Guidelines for People with Diabetes:Non Diabetic:  <6%Goal of Therapy: <7%Additional Action Suggested:  >8%   . Sodium 03/14/2015 138  135 - 145 mEq/L Final  . Potassium 03/14/2015 4.0  3.5 - 5.1 mEq/L Final  . Chloride 03/14/2015 99  96 - 112 mEq/L Final  . CO2 03/14/2015 30  19 - 32 mEq/L Final  . Glucose, Bld 03/14/2015 84  70 - 99 mg/dL Final  . BUN 03/14/2015 15  6 - 23 mg/dL Final  . Creatinine, Ser 03/14/2015 1.39  0.40 - 1.50  mg/dL Final  . Total Bilirubin 03/14/2015 0.3  0.2 - 1.2 mg/dL Final  . Alkaline Phosphatase 03/14/2015 111  39 - 117 U/L Final  . AST 03/14/2015 18  0 - 37 U/L Final  . ALT 03/14/2015 19  0 - 53 U/L Final  . Total Protein 03/14/2015 7.7  6.0 - 8.3 g/dL Final  . Albumin 03/14/2015 4.0  3.5 - 5.2 g/dL Final  . Calcium 03/14/2015 9.8  8.4 - 10.5 mg/dL Final  . GFR 03/14/2015 63.76  >60.00 mL/min Final  . Cholesterol 03/14/2015 227* 0 - 200 mg/dL Final   ATP III Classification       Desirable:  < 200 mg/dL               Borderline High:  200 - 239 mg/dL          High:  > = 240 mg/dL  . Triglycerides 03/14/2015 204.0* 0.0 - 149.0 mg/dL Final   Normal:  <150 mg/dLBorderline High:  150 - 199 mg/dL  . HDL 03/14/2015 37.00* >39.00 mg/dL Final  . VLDL 03/14/2015 40.8* 0.0 - 40.0 mg/dL Final  . Total CHOL/HDL Ratio 03/14/2015 6   Final                  Men          Women1/2 Average Risk     3.4          3.3Average Risk          5.0          4.42X Average Risk          9.6          7.13X Average Risk          15.0          11.0                      . NonHDL 03/14/2015 189.95   Final   NOTE:  Non-HDL goal should be 30 mg/dL higher than patient's LDL goal (i.e. LDL goal of < 70 mg/dL, would have non-HDL goal of < 100 mg/dL)  . Direct LDL 03/14/2015 159.0   Final   Optimal:  <100 mg/dLNear or Above Optimal:  100-129 mg/dLBorderline High:  130-159 mg/dLHigh:  160-189 mg/dLVery High:  >190 mg/dL         Medication List       This list is accurate as of: 03/17/15 11:59 PM.  Always use your most recent med list.  amlodipine-benazepril 2.5-10 MG capsule  Commonly known as:  LOTREL     ezetimibe 10 MG tablet  Commonly known as:  ZETIA  Take 1 tablet (10 mg total) by mouth daily.     Insulin Pen Needle 32G X 4 MM Misc  2 each by Does not apply route 2 (two) times daily.     LANTUS SOLOSTAR 100 UNIT/ML Solostar Pen  Generic drug:  Insulin Glargine  INJECT 16 UNITS UNDER THE  SKIN ONCE DAILY.     metFORMIN 500 MG tablet  Commonly known as:  GLUCOPHAGE  Take 2 tablets 2 times a day     metoprolol succinate 25 MG 24 hr tablet  Commonly known as:  TOPROL-XL     rosuvastatin 40 MG tablet  Commonly known as:  CRESTOR     VICTOZA 18 MG/3ML Sopn  Generic drug:  Liraglutide  INJECT 0.3 ML (1.8 MG TOTAL) INTO THE SKIN DAILY     ZOSTAVAX 09811 UNT/0.65ML injection  Generic drug:  zoster vaccine live (PF)        Allergies: No Known Allergies  No past medical history on file.  Past Surgical History  Procedure Laterality Date  . Femoral-femoral bypass graft Bilateral 2007    Family History  Problem Relation Age of Onset  . Diabetes Sister     Social History:  reports that he has quit smoking. He has never used smokeless tobacco. His alcohol and drug histories are not on file.  Review of Systems:  Hypertension:  well controlled usually Continues to take Lotrel and metoprolol, also followed by PCP   Mild renal insufficiency: Creatinine is  upper normal in stable  Lab Results  Component Value Date   CREATININE 1.39 03/14/2015   BUN 15 03/14/2015   NA 138 03/14/2015   K 4.0 03/14/2015   CL 99 03/14/2015   CO2 30 03/14/2015     Lipids: Has had significant dyslipidemia,and recently is Lipitor was changed to Crestor 40 mg daily but his lipids  are not at goal    Lab Results  Component Value Date   CHOL 227* 03/14/2015   HDL 37.00* 03/14/2015   LDLCALC 168* 12/12/2014   LDLDIRECT 159.0 03/14/2015   TRIG 204.0* 03/14/2015   CHOLHDL 6 03/14/2015        Examination:   BP 138/62 mmHg  Pulse 108  Temp(Src) 98.6 F (37 C)  Resp 16  Ht 5\' 10"  (1.778 m)  Wt 237 lb 9.6 oz (107.775 kg)  BMI 34.09 kg/m2  SpO2 95%  Body mass index is 34.09 kg/(m^2).    ASSESSMENT/ PLAN:   Diabetes type 2   See history of present illness for current management and problems identified His A1c has  stayed over 7% and difficult although when his blood  sugars are high since he is monitoring sporadically and mostly in the morning May be having high postprandial readings as he cannot be consistent with diet and not able to lose weight either Fasting readings are mildly increased, occasionally normal with his Lantus  Also continues on maximum dose of Victoza  Reminded him to check more readings overall and also after meals especially supper which he is not doing now  he also needs to be doing more aerobic exercise on treadmill or elliptical He may need mealtime insulin if he has consistent postprandial hyperglycemia after supper Discussed A1c target of under 7  Renal dysfunction: His creatinine is high normal consistently This is likely to be from nephrosclerosis,  his  creatinine clearance is over 50  Blood pressure is controlled on 2 drugs  HYPERLIPIDEMIA: His fasting lipids  still show high LDL despite maximum dose Crestor Discussed target LDL levels and will add Zetia Discussed all this works to help hypercholesterolemia and benefits and long-term safety Also needs to be losing weight to help HDL and triglycerides   Patient Instructions  Check blood sugars on waking up 3  times a week Also check blood sugars about 2 hurs after a meal and do this after different meals by rotation  Recommended blood sugar levels on waking up is 90-130 and about 2 hours after meal is 130-160  Please bring your blood sugar monitor to each visit, thank you  Walk on treadmill 4 days per week   Counseling time on subjects discussed above is over 50% of today's 25 minute visit  Ronya Gilcrest 03/18/2015, 4:18 PM   He is on Lantus

## 2015-03-17 NOTE — Patient Instructions (Addendum)
Check blood sugars on waking up 3  times a week Also check blood sugars about 2 hurs after a meal and do this after different meals by rotation  Recommended blood sugar levels on waking up is 90-130 and about 2 hours after meal is 130-160  Please bring your blood sugar monitor to each visit, thank you  Walk on treadmill 4 days per week

## 2015-03-29 ENCOUNTER — Other Ambulatory Visit: Payer: Self-pay | Admitting: Endocrinology

## 2015-06-13 ENCOUNTER — Other Ambulatory Visit (INDEPENDENT_AMBULATORY_CARE_PROVIDER_SITE_OTHER): Payer: Medicare HMO

## 2015-06-13 DIAGNOSIS — E785 Hyperlipidemia, unspecified: Secondary | ICD-10-CM

## 2015-06-13 DIAGNOSIS — E1165 Type 2 diabetes mellitus with hyperglycemia: Secondary | ICD-10-CM

## 2015-06-13 DIAGNOSIS — Z794 Long term (current) use of insulin: Secondary | ICD-10-CM

## 2015-06-13 LAB — COMPREHENSIVE METABOLIC PANEL
ALK PHOS: 107 U/L (ref 39–117)
ALT: 14 U/L (ref 0–53)
AST: 13 U/L (ref 0–37)
Albumin: 3.8 g/dL (ref 3.5–5.2)
BILIRUBIN TOTAL: 0.2 mg/dL (ref 0.2–1.2)
BUN: 16 mg/dL (ref 6–23)
CALCIUM: 9.4 mg/dL (ref 8.4–10.5)
CO2: 32 meq/L (ref 19–32)
CREATININE: 1.43 mg/dL (ref 0.40–1.50)
Chloride: 100 mEq/L (ref 96–112)
GFR: 61.67 mL/min (ref >60.00)
GLUCOSE: 153 mg/dL — AB (ref 70–99)
Potassium: 4.3 mEq/L (ref 3.5–5.1)
Sodium: 136 mEq/L (ref 135–145)
TOTAL PROTEIN: 7.5 g/dL (ref 6.0–8.3)

## 2015-06-13 LAB — LIPID PANEL
Cholesterol: 273 mg/dL — ABNORMAL HIGH (ref 0–200)
HDL: 32.3 mg/dL — AB (ref >39.00)
NONHDL: 241.09
TRIGLYCERIDES: 321 mg/dL — AB (ref 0.0–149.0)
Total CHOL/HDL Ratio: 8
VLDL: 64.2 mg/dL — ABNORMAL HIGH (ref 0.0–40.0)

## 2015-06-13 LAB — LDL CHOLESTEROL, DIRECT: LDL DIRECT: 176 mg/dL

## 2015-06-13 LAB — HEMOGLOBIN A1C: Hgb A1c MFr Bld: 8.2 % — ABNORMAL HIGH (ref 4.6–6.5)

## 2015-06-16 ENCOUNTER — Encounter: Payer: Self-pay | Admitting: Endocrinology

## 2015-06-16 ENCOUNTER — Ambulatory Visit (INDEPENDENT_AMBULATORY_CARE_PROVIDER_SITE_OTHER): Payer: Medicare HMO | Admitting: Endocrinology

## 2015-06-16 VITALS — BP 126/62 | HR 86 | Temp 97.9°F | Resp 20 | Ht 70.0 in | Wt 247.5 lb

## 2015-06-16 DIAGNOSIS — E1165 Type 2 diabetes mellitus with hyperglycemia: Secondary | ICD-10-CM

## 2015-06-16 DIAGNOSIS — E785 Hyperlipidemia, unspecified: Secondary | ICD-10-CM

## 2015-06-16 DIAGNOSIS — Z794 Long term (current) use of insulin: Secondary | ICD-10-CM

## 2015-06-16 NOTE — Progress Notes (Signed)
Patient ID: Eric Long, male   DOB: 1940/12/29, 75 y.o.   MRN: [id number]   Reason for Appointment: Diabetes follow-up   History of Present Illness   Diagnosis: Type 2 DIABETES MELITUS, date of diagnosis: 2006      Previous history: He was previously treated with metformin, Amaryl and Januvia but subsequently had poor control with A1c of 11.8 in 2011 and 11.6 in 7/13 In 8/13 is Januvia was stopped and he was started on Victoza and Lantus insulin With this his blood sugars were improved significantly and his Amaryl was stopped. He also started losing weight His A1c in 6/14 was excellent at 6.3  Recent history:    Insulin regimen: Levemir 18 units at bfst         Non-insulin hypoglycemic drugs: Metformin 2 g daily, Victoza 1.8 mg daily        Although he had better control in May 2016 when his A1c was 6.7 it is progressively higher now 8.2  Current management, blood sugar patterns and problems identified:  He has not brought his monitor for download today  He has been taking Levemir on his own in the mornings instead of evenings since his last visit  With this his fasting readings are mostly high but inconsistent  Also he things blood sugars are relatively higher after supper  He has gained 10 pounds in the last 3 months and this is likely to be from inadequate exercise and poor diet, he has had meal planning with the dietitian previously  He still continues to take 1.8 mg Victoza without any side effects every morning        Monitors blood glucose: Marland Kitchen    Glucometer: One [employer].          Blood Glucose readings from recall  Mean values apply above for all meters except median for One Touch  PRE-MEAL Fasting Lunch Dinner Bedtime Overall  Glucose range: 95-215      Mean/median:        POST-MEAL PC Breakfast PC Lunch PC Dinner  Glucose range:   125-195  Mean/median:      Physical activity: exercise: Walking about 2-3 days a week   Dietician visit:   07/2014, has seen CDE in [id number]        Complications: are: Peripheral vascular disease, neuropathy     Wt Readings from Last 3 Encounters:  06/16/15 247 lb 8 oz (112.265 kg)  03/17/15 237 lb 9.6 oz (107.775 kg)  12/15/14 239 lb 3.2 oz (108.5 kg)     LABS:  Lab Results  Component Value Date   HGBA1C 8.2* 06/13/2015   HGBA1C 7.1* 03/14/2015   HGBA1C 7.2* 12/12/2014   Lab Results  Component Value Date   MICROALBUR 3.1* 03/17/2015   LDLCALC 168* 12/12/2014   CREATININE 1.43 06/13/2015   Lab on 06/13/2015  Component Date Value Ref Range Status  . Hgb A1c MFr Bld 06/13/2015 8.2* 4.6 - 6.5 % Final   Glycemic Control Guidelines for People with Diabetes:Non Diabetic:  <6%Goal of Therapy: <7%Additional Action Suggested:  >8%   . Sodium 06/13/2015 136  135 - 145 mEq/L Final  . Potassium 06/13/2015 4.3  3.5 - 5.1 mEq/L Final  . Chloride 06/13/2015 100  96 - 112 mEq/L Final  . CO2 06/13/2015 32  19 - 32 mEq/L Final  . Glucose, Bld 06/13/2015 153* 70 - 99 mg/dL Final  . BUN 06/13/2015 16  6 - 23 mg/dL Final  . Creatinine,  Ser 06/13/2015 1.43  0.40 - 1.50 mg/dL Final  . Total Bilirubin 06/13/2015 0.2  0.2 - 1.2 mg/dL Final  . Alkaline Phosphatase 06/13/2015 107  39 - 117 U/L Final  . AST 06/13/2015 13  0 - 37 U/L Final  . ALT 06/13/2015 14  0 - 53 U/L Final  . Total Protein 06/13/2015 7.5  6.0 - 8.3 g/dL Final  . Albumin 06/13/2015 3.8  3.5 - 5.2 g/dL Final  . Calcium 06/13/2015 9.4  8.4 - 10.5 mg/dL Final  . GFR 06/13/2015 61.67  >60.00 mL/min Final  . Cholesterol 06/13/2015 273* 0 - 200 mg/dL Final   ATP III Classification       Desirable:  < 200 mg/dL               Borderline High:  200 - 239 mg/dL          High:  > = 240 mg/dL  . Triglycerides 06/13/2015 321.0* 0.0 - 149.0 mg/dL Final   Normal:  <150 mg/dLBorderline High:  150 - 199 mg/dL  . HDL 06/13/2015 32.30* >39.00 mg/dL Final  . VLDL 06/13/2015 64.2* 0.0 - 40.0 mg/dL Final  . Total CHOL/HDL Ratio 06/13/2015 8   Final                   Men          Women1/2 Average Risk     3.4          3.3Average Risk          5.0          4.42X Average Risk          9.6          7.13X Average Risk          15.0          11.0                      . NonHDL 06/13/2015 241.09   Final   NOTE:  Non-HDL goal should be 30 mg/dL higher than patient's LDL goal (i.e. LDL goal of < 70 mg/dL, would have non-HDL goal of < 100 mg/dL)  . Direct LDL 06/13/2015 176.0   Final   Optimal:  <100 mg/dLNear or Above Optimal:  100-129 mg/dLBorderline High:  130-159 mg/dLHigh:  160-189 mg/dLVery High:  >190 mg/dL         Medication List       This list is accurate as of: 06/16/15 11:59 PM.  Always use your most recent med list.               amlodipine-benazepril 2.5-10 MG capsule  Commonly known as:  LOTREL  Take 1 capsule by mouth daily.     insulin detemir 100 UNIT/ML injection  Commonly known as:  LEVEMIR  Inject 18 Units into the skin at bedtime.     Insulin Pen Needle 32G X 4 MM Misc  2 each by Does not apply route 2 (two) times daily.     metFORMIN 500 MG tablet  Commonly known as:  GLUCOPHAGE  Take 2 tablets 2 times a day     metoprolol succinate 25 MG 24 hr tablet  Commonly known as:  TOPROL-XL  Take 25 mg by mouth daily.     rosuvastatin 40 MG tablet  Commonly known as:  CRESTOR  Take 40 mg by mouth daily.     VICTOZA 18 MG/3ML Sopn  Generic  drug:  Liraglutide  INJECT 0.3 ML (1.8 MG TOTAL) INTO THE SKIN DAILY        Allergies: No Known Allergies  No past medical history on file.  Past Surgical History  Procedure Laterality Date  . Femoral-femoral bypass graft Bilateral 2007    Family History  Problem Relation Age of Onset  . Diabetes Sister     Social History:  reports that he has quit smoking. He has never used smokeless tobacco. His alcohol and drug histories are not on file.  Review of Systems:  Hypertension:  well controlled usually Continues to take Lotrel and metoprolol, also followed by PCP    Mild renal insufficiency: Creatinine is  upper normal in stable  Lab Results  Component Value Date   CREATININE 1.43 06/13/2015   BUN 16 06/13/2015   NA 136 06/13/2015   K 4.3 06/13/2015   CL 100 06/13/2015   CO2 32 06/13/2015     Lipids: Has had mixed hyperlipidemia and also significantly high LDL He thinks he is very compliant with the CRESTOR and ZETIA since his last visit However LDL is still significantly higher    Lab Results  Component Value Date   CHOL 273* 06/13/2015   HDL 32.30* 06/13/2015   LDLCALC 168* 12/12/2014   LDLDIRECT 176.0 06/13/2015   TRIG 321.0* 06/13/2015   CHOLHDL 8 06/13/2015        Examination:   BP 126/62 mmHg  Pulse 86  Temp(Src) 97.9 F (36.6 C) (Oral)  Resp 20  Ht 5\' 10"  (1.778 m)  Wt 247 lb 8 oz (112.265 kg)  BMI 35.51 kg/m2  SpO2 97%  Body mass index is 35.51 kg/(m^2).    ASSESSMENT/ PLAN:   Diabetes type 2  See history of present illness for detailed discussion of his current management, blood sugar patterns and problems identified His A1c is 8.2 % and higher than usual However he does not report consistently high readings but may not be checking enough after meals Unlikely that Levemir is lasting 24 hours and probably does better for his fasting readings with evening timing  Recommendations today:  More consistent diet  More readings after meals and bring monitor on each visit  Change Levemir back to suppertime and may need to go 20 units if fasting readings are continuously over 140  Consider mealtime insulin at suppertime if postprandial readings go up  More regular exercise   HYPERLIPIDEMIA: His fasting lipids  still show high LDL despite maximum dose Crestor and also having Zetia Since he has had peripheral last blood is able to try to get his paperwork filled out to get Repatha approved from his insurance Discussed in detail how this works and he is interested in trying December 2 weeks with the injection He  can leave off Zetia as it is not helpful Also needs to be losing weight to help HDL and triglycerides   Patient Instructions  Levemir change to dinner time  If am sugar stays >140, then to 20 units  Check blood sugars on waking up 3  times a week Also check blood sugars about 2 hours after a meal and do this after different meals by rotation  Recommended blood sugar levels on waking up is 90-130 and about 2 hours after meal is 130-160  Please bring your blood sugar monitor to each visit, thank you      Counseling time on subjects discussed above is over 50% of today's 25 minute visit  Huey Scalia 06/18/2015, 8:21 PM

## 2015-06-16 NOTE — Progress Notes (Signed)
Pre visit review using our clinic review tool, if applicable. No additional management support is needed unless otherwise documented below in the visit note. 

## 2015-06-16 NOTE — Patient Instructions (Addendum)
Levemir change to dinner time  If am sugar stays >140, then to 20 units  Check blood sugars on waking up 3  times a week Also check blood sugars about 2 hours after a meal and do this after different meals by rotation  Recommended blood sugar levels on waking up is 90-130 and about 2 hours after meal is 130-160  Please bring your blood sugar monitor to each visit, thank you

## 2015-07-24 ENCOUNTER — Other Ambulatory Visit (INDEPENDENT_AMBULATORY_CARE_PROVIDER_SITE_OTHER): Payer: Medicare HMO

## 2015-07-24 DIAGNOSIS — E1165 Type 2 diabetes mellitus with hyperglycemia: Secondary | ICD-10-CM

## 2015-07-24 DIAGNOSIS — Z794 Long term (current) use of insulin: Secondary | ICD-10-CM | POA: Diagnosis not present

## 2015-07-24 LAB — BASIC METABOLIC PANEL
BUN: 20 mg/dL (ref 6–23)
CHLORIDE: 101 meq/L (ref 96–112)
CO2: 29 meq/L (ref 19–32)
Calcium: 9.3 mg/dL (ref 8.4–10.5)
Creatinine, Ser: 1.59 mg/dL — ABNORMAL HIGH (ref 0.40–1.50)
GFR: 54.55 mL/min — ABNORMAL LOW (ref >60.00)
GLUCOSE: 202 mg/dL — AB (ref 70–99)
POTASSIUM: 4.7 meq/L (ref 3.5–5.1)
Sodium: 138 mEq/L (ref 135–145)

## 2015-07-25 LAB — FRUCTOSAMINE: Fructosamine: 300 umol/L — ABNORMAL HIGH (ref 0–285)

## 2015-07-28 ENCOUNTER — Ambulatory Visit (INDEPENDENT_AMBULATORY_CARE_PROVIDER_SITE_OTHER): Payer: Medicare HMO | Admitting: Endocrinology

## 2015-07-28 ENCOUNTER — Encounter: Payer: Self-pay | Admitting: Endocrinology

## 2015-07-28 VITALS — BP 154/68 | HR 87 | Temp 98.2°F | Resp 14 | Ht 72.0 in | Wt 246.2 lb

## 2015-07-28 DIAGNOSIS — E785 Hyperlipidemia, unspecified: Secondary | ICD-10-CM

## 2015-07-28 DIAGNOSIS — Z794 Long term (current) use of insulin: Secondary | ICD-10-CM

## 2015-07-28 DIAGNOSIS — E1165 Type 2 diabetes mellitus with hyperglycemia: Secondary | ICD-10-CM

## 2015-07-28 DIAGNOSIS — M48062 Spinal stenosis, lumbar region with neurogenic claudication: Secondary | ICD-10-CM

## 2015-07-28 DIAGNOSIS — G9519 Other vascular myelopathies: Secondary | ICD-10-CM

## 2015-07-28 DIAGNOSIS — M4806 Spinal stenosis, lumbar region: Secondary | ICD-10-CM

## 2015-07-28 MED ORDER — INSULIN DEGLUDEC 100 UNIT/ML ~~LOC~~ SOPN: 25.0000 [IU] | PEN_INJECTOR | Freq: Every day | SUBCUTANEOUS | Status: DC

## 2015-07-28 NOTE — Progress Notes (Signed)
Patient ID: Eric Long, male   DOB: [birth year], 75 y.o.   MRN: [id number]   Reason for Appointment: Diabetes follow-up   History of Present Illness   Diagnosis: Type 2 DIABETES MELITUS, date of diagnosis: 2006      Previous history: He was previously treated with metformin, Amaryl and Januvia but subsequently had poor control with A1c of 11.8 in 2011 and 11.6 in 7/13 In 8/13 is Januvia was stopped and he was started on Victoza and Lantus insulin With this his blood sugars were improved significantly and his Amaryl was stopped. He also started losing weight His A1c in 6/14 was excellent at 6.3  Recent history:    Insulin regimen: Levemir 20 units at dinnertime          Non-insulin hypoglycemic drugs: Metformin 2 g daily, Victoza 1.8 mg daily        Although he had better control in May 2016 when his A1c was 6.7 it is progressively higher with the last level 8.2 Fructosamine valleys also relatively high at 300  Current management, blood sugar patterns and problems identified:  He was told to check blood sugars more consistently and he has done a few but only sporadically after meals  Also he was told to take his Levemir in the evening but he still has high fasting readings  Has only one good fasting readings, otherwise there are about 160-200 range  taking the morning, glucose is not better with increasing the dose by 2 units  Blood sugars in the evenings are about the same as fasting with only a couple of good readings  He has started back on exercise more regularly but has not lost any weight  He still continues to take 1.8 mg Victoza without any side effects every morning  He thinks his portions are relatively small and is not eating a lot of carbohydrates       Monitors blood glucose: Marland Kitchen    Glucometer: [employer].          Blood Glucose readings from download:  Mean values apply above for all meters except median for One Touch  PRE-MEAL Fasting Lunch  Dinner Bedtime Overall  Glucose range: 116- 207  139  180, 192     Mean/median:     172    Physical activity: exercise: At gym, limited walking; about 2-3 days a week   Dietician visit:  07/2014, has seen CDE in [id number]        Complications: are: Peripheral vascular disease, neuropathy     Wt Readings from Last 3 Encounters:  07/28/15 246 lb 3.2 oz (111.676 kg)  06/16/15 247 lb 8 oz (112.265 kg)  03/17/15 237 lb 9.6 oz (107.775 kg)     LABS:  Lab Results  Component Value Date   HGBA1C 8.2* 06/13/2015   HGBA1C 7.1* 03/14/2015   HGBA1C 7.2* 12/12/2014   Lab Results  Component Value Date   MICROALBUR 3.1* 03/17/2015   LDLCALC 168* 12/12/2014   CREATININE 1.59* 07/24/2015   Lab on 07/24/2015  Component Date Value Ref Range Status  . Sodium 07/24/2015 138  135 - 145 mEq/L Final  . Potassium 07/24/2015 4.7  3.5 - 5.1 mEq/L Final  . Chloride 07/24/2015 101  96 - 112 mEq/L Final  . CO2 07/24/2015 29  19 - 32 mEq/L Final  . Glucose, Bld 07/24/2015 202* 70 - 99 mg/dL Final  . BUN 07/24/2015 20  6 - 23 mg/dL Final  .  Creatinine, Ser 07/24/2015 1.59* 0.40 - 1.50 mg/dL Final  . Calcium 07/24/2015 9.3  8.4 - 10.5 mg/dL Final  . GFR 07/24/2015 54.55* >60.00 mL/min Final  . Fructosamine 07/24/2015 300* 0 - 285 umol/L Final   Comment: Published reference interval for apparently healthy subjects between age 15 and 63 is 37 - 285 umol/L and in a poorly controlled diabetic population is 228 - 563 umol/L with a mean of 396 umol/L.          Medication List       This list is accurate as of: 07/28/15 11:59 PM.  Always use your most recent med list.               amlodipine-benazepril 2.5-10 MG capsule  Commonly known as:  LOTREL  Take 1 capsule by mouth daily.     Insulin Degludec 100 UNIT/ML Sopn  Commonly known as:  TRESIBA FLEXTOUCH  Inject 25 Units into the skin daily.     insulin detemir 100 UNIT/ML injection  Commonly known as:  LEVEMIR  Inject 18 Units into the  skin at bedtime.     Insulin Pen Needle 32G X 4 MM Misc  2 each by Does not apply route 2 (two) times daily.     metFORMIN 500 MG tablet  Commonly known as:  GLUCOPHAGE  Take 2 tablets 2 times a day     metoprolol succinate 25 MG 24 hr tablet  Commonly known as:  TOPROL-XL  Take 25 mg by mouth daily.     rosuvastatin 40 MG tablet  Commonly known as:  CRESTOR  Take 40 mg by mouth daily.     VICTOZA 18 MG/3ML Sopn  Generic drug:  Liraglutide  INJECT 0.3 ML (1.8 MG TOTAL) INTO THE SKIN DAILY        Allergies: No Known Allergies  No past medical history on file.  Past Surgical History  Procedure Laterality Date  . Femoral-femoral bypass graft Bilateral 2007    Family History  Problem Relation Age of Onset  . Diabetes Sister     Social History:  reports that he has quit smoking. He has never used smokeless tobacco. His alcohol and drug histories are not on file.  Review of Systems:  Hypertension:  Blood pressure is high, he says he has not taken his medications this morning Continues to take Lotrel and metoprolol, also followed by PCP   Mild renal insufficiency: Creatinine is relatively higher Does not take any nonsteroidal anti-inflammatory drugs  Lab Results  Component Value Date   CREATININE 1.59* 07/24/2015   BUN 20 07/24/2015   NA 138 07/24/2015   K 4.7 07/24/2015   CL 101 07/24/2015   CO2 29 07/24/2015     Lipids: Has had mixed hyperlipidemia and also significantly high LDLWhich is persistent He thinks he is very compliant with the CRESTOR and ZETIA  Application was sent to get Repatha approved but apparently his insurance information was inaccurate    Lab Results  Component Value Date   CHOL 273* 06/13/2015   HDL 32.30* 06/13/2015   LDLCALC 168* 12/12/2014   LDLDIRECT 176.0 06/13/2015   TRIG 321.0* 06/13/2015   CHOLHDL 8 06/13/2015      Although he has some history of claudication he now appears to be getting more for numbness on walking  rather than pain in his calf muscles on the left side   Examination:   BP 154/68 mmHg  Pulse 87  Temp(Src) 98.2 F (36.8 C)  Resp  14  Ht 6' (1.829 m)  Wt 246 lb 3.2 oz (111.676 kg)  BMI 33.38 kg/m2  SpO2 97%  Body mass index is 33.38 kg/(m^2).    ASSESSMENT/ PLAN:   Diabetes type 2  See history of present illness for detailed discussion of his current management, blood sugar patterns and problems identified He still appears to be needing higher dose of basal insulin with persistently high fasting readings His fructosamine is high  Recommendations today:  Emphasized consistent diet  Trial of Tresiba instead of Levemir for improved efficacy and 24 control  Discussed differences between this and Levemir and flexible timing.  Most likely he will need to reduce the dose initially and start with 20 units  Meanwhile he will finish up his Levemir with taking at least 24 units, discussed adjusting the dose based on fasting blood sugar patterns  He does need to check his sugars more consistently in especially in the mornings  More readings after meals also  Follow-up in 6 weeks   HYPERLIPIDEMIA: His lipids are not controlled with Zetia and Crestor and will try to get Repatha approved again He does have peripheral vascular disease  Also needs to be losing weight to help HDL and triglycerides  ?  Neurogenic claudication: Follow-up with PCP   Patient Instructions  If am sugar still >140 after every 4 days go up to 26 Levemir from 24  New insulin start 4 units less  Check blood sugars on waking up  3-4  times a week Also check blood sugars about 2 hours after a meal and do this after different meals by rotation  Recommended blood sugar levels on waking up is 90-130 and about 2 hours after meal is 130-160  Please bring your blood sugar monitor to each visit, thank you     Counseling time on subjects discussed above is over 50% of today's 25 minute  visit  Regie Bunner 07/30/2015, 10:43 AM

## 2015-07-28 NOTE — Patient Instructions (Addendum)
If am sugar still >140 after every 4 days go up to 26 Levemir from 24  New insulin start 4 units less  Check blood sugars on waking up  3-4  times a week Also check blood sugars about 2 hours after a meal and do this after different meals by rotation  Recommended blood sugar levels on waking up is 90-130 and about 2 hours after meal is 130-160  Please bring your blood sugar monitor to each visit, thank you

## 2015-08-09 DIAGNOSIS — H5203 Hypermetropia, bilateral: Secondary | ICD-10-CM | POA: Diagnosis not present

## 2015-08-09 DIAGNOSIS — H524 Presbyopia: Secondary | ICD-10-CM | POA: Diagnosis not present

## 2015-08-09 DIAGNOSIS — H40023 Open angle with borderline findings, high risk, bilateral: Secondary | ICD-10-CM | POA: Diagnosis not present

## 2015-08-09 DIAGNOSIS — H2513 Age-related nuclear cataract, bilateral: Secondary | ICD-10-CM | POA: Diagnosis not present

## 2015-09-04 ENCOUNTER — Other Ambulatory Visit: Payer: Medicare HMO

## 2015-09-06 ENCOUNTER — Other Ambulatory Visit (INDEPENDENT_AMBULATORY_CARE_PROVIDER_SITE_OTHER): Payer: Medicare HMO

## 2015-09-06 DIAGNOSIS — Z794 Long term (current) use of insulin: Secondary | ICD-10-CM | POA: Diagnosis not present

## 2015-09-06 DIAGNOSIS — E1165 Type 2 diabetes mellitus with hyperglycemia: Secondary | ICD-10-CM

## 2015-09-06 LAB — COMPREHENSIVE METABOLIC PANEL
ALBUMIN: 3.6 g/dL (ref 3.5–5.2)
ALK PHOS: 99 U/L (ref 39–117)
ALT: 14 U/L (ref 0–53)
AST: 13 U/L (ref 0–37)
BUN: 19 mg/dL (ref 6–23)
CHLORIDE: 101 meq/L (ref 96–112)
CO2: 30 mEq/L (ref 19–32)
Calcium: 9.2 mg/dL (ref 8.4–10.5)
Creatinine, Ser: 1.43 mg/dL (ref 0.40–1.50)
GFR: 61.63 mL/min (ref >60.00)
Glucose, Bld: 163 mg/dL — ABNORMAL HIGH (ref 70–99)
POTASSIUM: 4.2 meq/L (ref 3.5–5.1)
SODIUM: 137 meq/L (ref 135–145)
Total Bilirubin: 0.3 mg/dL (ref 0.2–1.2)
Total Protein: 6.9 g/dL (ref 6.0–8.3)

## 2015-09-06 LAB — HEMOGLOBIN A1C: HEMOGLOBIN A1C: 8.1 % — AB (ref 4.6–6.5)

## 2015-09-07 ENCOUNTER — Encounter: Payer: Self-pay | Admitting: Endocrinology

## 2015-09-07 ENCOUNTER — Other Ambulatory Visit: Payer: Self-pay

## 2015-09-07 ENCOUNTER — Ambulatory Visit (INDEPENDENT_AMBULATORY_CARE_PROVIDER_SITE_OTHER): Payer: Medicare HMO | Admitting: Endocrinology

## 2015-09-07 VITALS — BP 140/72 | HR 96 | Temp 98.5°F | Ht 73.0 in | Wt 246.0 lb

## 2015-09-07 DIAGNOSIS — E785 Hyperlipidemia, unspecified: Secondary | ICD-10-CM

## 2015-09-07 DIAGNOSIS — N182 Chronic kidney disease, stage 2 (mild): Secondary | ICD-10-CM | POA: Diagnosis not present

## 2015-09-07 DIAGNOSIS — E1165 Type 2 diabetes mellitus with hyperglycemia: Secondary | ICD-10-CM | POA: Diagnosis not present

## 2015-09-07 DIAGNOSIS — R69 Illness, unspecified: Secondary | ICD-10-CM | POA: Diagnosis not present

## 2015-09-07 DIAGNOSIS — Z794 Long term (current) use of insulin: Secondary | ICD-10-CM

## 2015-09-07 MED ORDER — GLUCOSE BLOOD VI STRP: ORAL_STRIP | Status: DC

## 2015-09-07 NOTE — Progress Notes (Signed)
Patient ID: Eric Long, male   DOB: 1941/01/22, 75 y.o.   MRN: [id number]   Reason for Appointment: Diabetes follow-up   History of Present Illness   Diagnosis: Type 2 DIABETES MELITUS, date of diagnosis: 2006      Previous history: He was previously treated with metformin, Amaryl and Januvia but subsequently had poor control with A1c of 11.8 in 2011 and 11.6 in 7/13 In 8/13 is Januvia was stopped and he was started on Victoza and Lantus insulin With this his blood sugars were improved significantly and his Amaryl was stopped. He also started losing weight His A1c in 6/14 was excellent at 6.3  Recent history:    Insulin regimen: Lantus 24 units at breakfast          Non-insulin hypoglycemic drugs: Metformin 2 g daily, Victoza 1.8 mg daily        Although he had better control in May 2016 when his A1c was 6.7 but subsequently is progressively higher, now staying around 8%  Current management, blood sugar patterns and problems identified:  He was supposed to switch Lantus to [place] but not clear whether this was not covered by insurance  He has gone up 4 units on the Lantus as recommended for getting morning readings in to control.  However he has mostly high readings fasting; highest reading was after eating pizza the night before  Today his glucose was 184 even with eating salad the night before  He has done only one reading in the afternoon and 1 after supper  He now reveals that he is buying his test strips OTC and not with the prescription and does not check very often because of this  He is still not able to lose weight        Monitors blood glucose: Marland Kitchen    Glucometer: [employer].          Blood Glucose readings from download:  Mean values apply above for all meters except median for One Touch  PRE-MEAL Fasting Lunch Dinner Bedtime Overall  Glucose range: 140-297  133 179   Mean/median:     171   Physical activity: exercise: At gym, limited  walking; about 2-3 days a week   Dietician visit:  07/2014, has seen CDE in [id number]        Complications: are: Peripheral vascular disease, neuropathy     Wt Readings from Last 3 Encounters:  09/07/15 246 lb (111.585 kg)  07/28/15 246 lb 3.2 oz (111.676 kg)  06/16/15 247 lb 8 oz (112.265 kg)     LABS:  Lab Results  Component Value Date   HGBA1C 8.1* 09/06/2015   HGBA1C 8.2* 06/13/2015   HGBA1C 7.1* 03/14/2015   Lab Results  Component Value Date   MICROALBUR 3.1* 03/17/2015   LDLCALC 168* 12/12/2014   CREATININE 1.43 09/06/2015   Lab on 09/06/2015  Component Date Value Ref Range Status  . Hgb A1c MFr Bld 09/06/2015 8.1* 4.6 - 6.5 % Final   Glycemic Control Guidelines for People with Diabetes:Non Diabetic:  <6%Goal of Therapy: <7%Additional Action Suggested:  >8%   . Sodium 09/06/2015 137  135 - 145 mEq/L Final  . Potassium 09/06/2015 4.2  3.5 - 5.1 mEq/L Final  . Chloride 09/06/2015 101  96 - 112 mEq/L Final  . CO2 09/06/2015 30  19 - 32 mEq/L Final  . Glucose, Bld 09/06/2015 163* 70 - 99 mg/dL Final  . BUN 09/06/2015 19  6 - 23 mg/dL  Final  . Creatinine, Ser 09/06/2015 1.43  0.40 - 1.50 mg/dL Final  . Total Bilirubin 09/06/2015 0.3  0.2 - 1.2 mg/dL Final  . Alkaline Phosphatase 09/06/2015 99  39 - 117 U/L Final  . AST 09/06/2015 13  0 - 37 U/L Final  . ALT 09/06/2015 14  0 - 53 U/L Final  . Total Protein 09/06/2015 6.9  6.0 - 8.3 g/dL Final  . Albumin 09/06/2015 3.6  3.5 - 5.2 g/dL Final  . Calcium 09/06/2015 9.2  8.4 - 10.5 mg/dL Final  . GFR 09/06/2015 61.63  >60.00 mL/min Final         Medication List       This list is accurate as of: 09/07/15  9:11 AM.  Always use your most recent med list.               amlodipine-benazepril 2.5-10 MG capsule  Commonly known as:  LOTREL  Take 1 capsule by mouth daily.     glucose blood test strip  Commonly known as:  ONETOUCH VERIO  Use to check blood sugar 2 times per day. Dx code: E11.9     Insulin Degludec 100  UNIT/ML Sopn  Commonly known as:  TRESIBA FLEXTOUCH  Inject 25 Units into the skin daily.     insulin detemir 100 UNIT/ML injection  Commonly known as:  LEVEMIR  Inject 24 Units into the skin at bedtime.     Insulin Pen Needle 32G X 4 MM Misc  2 each by Does not apply route 2 (two) times daily.     LANTUS 100 UNIT/ML injection  Generic drug:  insulin glargine  Inject 18 Units into the skin every morning.     metFORMIN 500 MG tablet  Commonly known as:  GLUCOPHAGE  Take 2 tablets 2 times a day     metoprolol succinate 25 MG 24 hr tablet  Commonly known as:  TOPROL-XL  Take 25 mg by mouth daily.     rosuvastatin 40 MG tablet  Commonly known as:  CRESTOR  Take 40 mg by mouth daily.     VICTOZA 18 MG/3ML Sopn  Generic drug:  Liraglutide  INJECT 0.3 ML (1.8 MG TOTAL) INTO THE SKIN DAILY        Allergies: No Known Allergies  No past medical history on file.  Past Surgical History  Procedure Laterality Date  . Femoral-femoral bypass graft Bilateral 2007    Family History  Problem Relation Age of Onset  . Diabetes Sister     Social History:  reports that he has quit smoking. He has never used smokeless tobacco. His alcohol and drug histories are not on file.  Review of Systems:  Hypertension:  Blood pressure is better controlled now Continues to take Lotrel and metoprolol, also followed by PCP   Mild renal insufficiency: Creatinine is relatively better compared to last visit   Lab Results  Component Value Date   CREATININE 1.43 09/06/2015   BUN 19 09/06/2015   NA 137 09/06/2015   K 4.2 09/06/2015   CL 101 09/06/2015   CO2 30 09/06/2015     Lipids: Has had mixed hyperlipidemia and also significantly high LDL, last 176 He thinks he is very compliant with the CRESTOR and ZETIA  Application was sent to get Repatha approved but not clear of the response    Lab Results  Component Value Date   CHOL 273* 06/13/2015   HDL 32.30* 06/13/2015   LDLCALC 168*  12/12/2014  LDLDIRECT 176.0 06/13/2015   TRIG 321.0* 06/13/2015   CHOLHDL 8 06/13/2015      Although he has some history of claudication he now appears to be getting more for numbness on walking rather than pain in his calf muscles on the left side   Examination:   BP 140/72 mmHg  Pulse 96  Temp(Src) 98.5 F (36.9 C) (Oral)  Ht 6\' 1"  (1.854 m)  Wt 246 lb (111.585 kg)  BMI 32.46 kg/m2  SpO2 95%  Body mass index is 32.46 kg/(m^2).    ASSESSMENT/ PLAN:   Diabetes type 2  See history of present illness for detailed discussion of his current management, blood sugar patterns and problems identified He still appears to beHaving poor control With his checking blood sugars infrequently at home difficult to get her blood sugar pattern especially after evening meal Now with taking Lantus in the morning he still has high readings fasting mostly May have high readings after evening meal and not clear if he needs mealtime coverage  needing higher dose of basal insulin with persistently high fasting readings His fructosamine is high  Recommendations today:  Split Lantus to twice a day.  Discussed adjusting evening dose based on morning blood sugar patterns  Also discussed need for potential use of mealtime insulin which is rapid acting and may need some at suppertime especially when eating some carbohydrate.  Discussed that Victoza is once a day and non-insulin product and he needs to take this at the same time daily  Will check on coverage for Tresiba again  Follow-up in 6 weeks   HYPERLIPIDEMIA: Need to check coverage for Repatha   Patient Instructions  Lantus 16 units in am and 10 at supper  Must check sugars 2-3 hrs after dinner at least 3-4x per week  Check blood sugars on waking up 3  times a week Also check blood sugars about 2 hours after a meal and do this after different meals by rotation  Recommended blood sugar levels on waking up is 90-130 and about 2 hours  after meal is 130-160  Please bring your blood sugar monitor to each visit, thank you       Counseling time on subjects discussed above is over 50% of today's 25 minute visit  Shali Vesey 09/07/2015, 9:11 AM

## 2015-09-07 NOTE — Patient Instructions (Signed)
Lantus 16 units in am and 10 at supper  Must check sugars 2-3 hrs after dinner at least 3-4x per week  Check blood sugars on waking up 3  times a week Also check blood sugars about 2 hours after a meal and do this after different meals by rotation  Recommended blood sugar levels on waking up is 90-130 and about 2 hours after meal is 130-160  Please bring your blood sugar monitor to each visit, thank you

## 2015-09-08 DIAGNOSIS — Z794 Long term (current) use of insulin: Secondary | ICD-10-CM | POA: Diagnosis not present

## 2015-09-08 DIAGNOSIS — I10 Essential (primary) hypertension: Secondary | ICD-10-CM | POA: Diagnosis not present

## 2015-09-08 DIAGNOSIS — Z0000 Encounter for general adult medical examination without abnormal findings: Secondary | ICD-10-CM | POA: Diagnosis not present

## 2015-09-08 DIAGNOSIS — E784 Other hyperlipidemia: Secondary | ICD-10-CM | POA: Diagnosis not present

## 2015-09-08 DIAGNOSIS — E119 Type 2 diabetes mellitus without complications: Secondary | ICD-10-CM | POA: Diagnosis not present

## 2015-10-18 ENCOUNTER — Other Ambulatory Visit (INDEPENDENT_AMBULATORY_CARE_PROVIDER_SITE_OTHER): Payer: Medicare HMO

## 2015-10-18 DIAGNOSIS — Z794 Long term (current) use of insulin: Secondary | ICD-10-CM

## 2015-10-18 DIAGNOSIS — E1165 Type 2 diabetes mellitus with hyperglycemia: Secondary | ICD-10-CM | POA: Diagnosis not present

## 2015-10-18 LAB — BASIC METABOLIC PANEL
BUN: 13 mg/dL (ref 6–23)
CO2: 29 mEq/L (ref 19–32)
CREATININE: 1.51 mg/dL — AB (ref 0.40–1.50)
Calcium: 9.3 mg/dL (ref 8.4–10.5)
Chloride: 103 mEq/L (ref 96–112)
GFR: 57.86 mL/min — AB (ref >60.00)
GLUCOSE: 154 mg/dL — AB (ref 70–99)
Potassium: 4.6 mEq/L (ref 3.5–5.1)
Sodium: 139 mEq/L (ref 135–145)

## 2015-10-18 LAB — LDL CHOLESTEROL, DIRECT: Direct LDL: 110 mg/dL

## 2015-10-18 LAB — LIPID PANEL
Cholesterol: 166 mg/dL (ref 0–200)
HDL: 28.6 mg/dL — ABNORMAL LOW (ref >39.00)
NonHDL: 137.57
Total CHOL/HDL Ratio: 6
Triglycerides: 220 mg/dL — ABNORMAL HIGH (ref 0.0–149.0)
VLDL: 44 mg/dL — ABNORMAL HIGH (ref 0.0–40.0)

## 2015-10-19 LAB — FRUCTOSAMINE: Fructosamine: 288 umol/L — ABNORMAL HIGH (ref 0–285)

## 2015-10-23 ENCOUNTER — Ambulatory Visit (INDEPENDENT_AMBULATORY_CARE_PROVIDER_SITE_OTHER): Payer: Medicare HMO | Admitting: Endocrinology

## 2015-10-23 ENCOUNTER — Encounter: Payer: Self-pay | Admitting: Endocrinology

## 2015-10-23 VITALS — BP 132/65 | HR 103 | Ht 73.0 in | Wt 246.0 lb

## 2015-10-23 DIAGNOSIS — Z794 Long term (current) use of insulin: Secondary | ICD-10-CM | POA: Diagnosis not present

## 2015-10-23 DIAGNOSIS — E1165 Type 2 diabetes mellitus with hyperglycemia: Secondary | ICD-10-CM | POA: Diagnosis not present

## 2015-10-23 NOTE — Progress Notes (Signed)
Patient ID: Eric Long, male   DOB: 02/02/1941, 75 y.o.   MRN: [id number]   Reason for Appointment: Diabetes follow-up   History of Present Illness   Diagnosis: Type 2 DIABETES MELITUS, date of diagnosis: 2006      Previous history: He was previously treated with metformin, Amaryl and Januvia but subsequently had poor control with A1c of 11.8 in 2011 and 11.6 in 7/13 In 8/13 is Januvia was stopped and he was started on Victoza and Lantus insulin With this his blood sugars were improved significantly and his Amaryl was stopped. He also started losing weight His A1c in 6/14 was excellent at 6.3  Recent history:    Insulin regimen: Tresiba 25 units at breakfast          Non-insulin hypoglycemic drugs: Metformin 2 g daily, Victoza 1.8 mg daily        Although he had better control in May 2016 when his A1c was 6.7 but subsequently is progressively higher, now staying around 8%  Current management, blood sugar patterns and problems identified:  He was able to switch Lantus to [place] in 5/17  He is taking this in the evenings and has continued about the same dose of 25 units as with Lantus  His blood sugars are overall significantly better both morning and evening with starting this  He does have sporadic blood sugar that are higher in the morning but usually not in the evenings with the highest blood sugar this morning of 242 after eating country fried steak  last night  He has continued Victoza in the morning  His blood sugars are excellent in the evenings after supper  No hypoglycemia  His weight has stayed the same despite better blood sugars  No labs recently to objectively assess his control  Has checked blood sugars about once a day now with getting a prescription to cover his strips        Monitors blood glucose:  about once a day .    Glucometer: [employer].          Blood Glucose readings from download:  Mean values apply above for all meters  except median for One Touch  PRE-MEAL Fasting Lunch Dinner Bedtime Overall  Glucose range: 91-242   109, 170  100-136    Mean/median: 152     130+/-37    Physical activity: exercise: At gym, limited walking; about 2-3 days a week   Dietician visit:  07/2014, has seen CDE in [id number]        Complications: are: Peripheral vascular disease, neuropathy     Wt Readings from Last 3 Encounters:  10/23/15 246 lb (111.585 kg)  09/07/15 246 lb (111.585 kg)  07/28/15 246 lb 3.2 oz (111.676 kg)     LABS:  Lab Results  Component Value Date   HGBA1C 8.1* 09/06/2015   HGBA1C 8.2* 06/13/2015   HGBA1C 7.1* 03/14/2015   Lab Results  Component Value Date   MICROALBUR 3.1* 03/17/2015   LDLCALC 168* 12/12/2014   CREATININE 1.51* 10/18/2015   Lab on 10/18/2015  Component Date Value Ref Range Status  . Sodium 10/18/2015 139  135 - 145 mEq/L Final  . Potassium 10/18/2015 4.6  3.5 - 5.1 mEq/L Final  . Chloride 10/18/2015 103  96 - 112 mEq/L Final  . CO2 10/18/2015 29  19 - 32 mEq/L Final  . Glucose, Bld 10/18/2015 154* 70 - 99 mg/dL Final  . BUN 10/18/2015 13  6 -  23 mg/dL Final  . Creatinine, Ser 10/18/2015 1.51* 0.40 - 1.50 mg/dL Final  . Calcium 10/18/2015 9.3  8.4 - 10.5 mg/dL Final  . GFR 10/18/2015 57.86* >60.00 mL/min Final  . Cholesterol 10/18/2015 166  0 - 200 mg/dL Final   ATP III Classification       Desirable:  < 200 mg/dL               Borderline High:  200 - 239 mg/dL          High:  > = 240 mg/dL  . Triglycerides 10/18/2015 220.0* 0.0 - 149.0 mg/dL Final   Normal:  <150 mg/dLBorderline High:  150 - 199 mg/dL  . HDL 10/18/2015 28.60* >39.00 mg/dL Final  . VLDL 10/18/2015 44.0* 0.0 - 40.0 mg/dL Final  . Total CHOL/HDL Ratio 10/18/2015 6   Final                  Men          Women1/2 Average Risk     3.4          3.3Average Risk          5.0          4.42X Average Risk          9.6          7.13X Average Risk          15.0          11.0                      . NonHDL 10/18/2015 137.57    Final   NOTE:  Non-HDL goal should be 30 mg/dL higher than patient's LDL goal (i.e. LDL goal of < 70 mg/dL, would have non-HDL goal of < 100 mg/dL)  . Fructosamine 10/18/2015 288* 0 - 285 umol/L Final   Comment: Published reference interval for apparently healthy subjects between age 22 and 28 is 67 - 285 umol/L and in a poorly controlled diabetic population is 228 - 563 umol/L with a mean of 396 umol/L.   Marland Kitchen Direct LDL 10/18/2015 110.0   Final   Optimal:  <100 mg/dLNear or Above Optimal:  100-129 mg/dLBorderline High:  130-159 mg/dLHigh:  160-189 mg/dLVery High:  >190 mg/dL         Medication List       This list is accurate as of: 10/23/15 12:58 PM.  Always use your most recent med list.               amlodipine-benazepril 2.5-10 MG capsule  Commonly known as:  LOTREL  Take 1 capsule by mouth daily.     glucose blood test strip  Commonly known as:  ONETOUCH VERIO  Use to check blood sugar 2 times per day. Dx code: E11.9     Insulin Pen Needle 32G X 4 MM Misc  2 each by Does not apply route 2 (two) times daily.     metFORMIN 500 MG tablet  Commonly known as:  GLUCOPHAGE  Take 2 tablets 2 times a day     metoprolol succinate 25 MG 24 hr tablet  Commonly known as:  TOPROL-XL  Take 25 mg by mouth daily.     rosuvastatin 40 MG tablet  Commonly known as:  CRESTOR  Take 40 mg by mouth daily.     TRESIBA FLEXTOUCH 100 UNIT/ML Sopn  Generic drug:  Insulin Degludec  25 Units at bedtime.  VICTOZA 18 MG/3ML Sopn  Generic drug:  Liraglutide  INJECT 0.3 ML (1.8 MG TOTAL) INTO THE SKIN DAILY        Allergies: No Known Allergies  No past medical history on file.  Past Surgical History  Procedure Laterality Date  . Femoral-femoral bypass graft Bilateral 2007    Family History  Problem Relation Age of Onset  . Diabetes Sister     Social History:  reports that he has quit smoking. He has never used smokeless tobacco. His alcohol and drug histories are not  on file.  Review of Systems:  Hypertension:  Continues to take Lotrel and metoprolol, also followed by PCP   Mild renal insufficiency: Creatinine is Variable   Lab Results  Component Value Date   CREATININE 1.51* 10/18/2015   BUN 13 10/18/2015   NA 139 10/18/2015   K 4.6 10/18/2015   CL 103 10/18/2015   CO2 29 10/18/2015     Lipids: Has had mixed hyperlipidemia and also significantly high LDL, last 176 He thinks he is very compliant with the CRESTOR and ZETIA   Recently LDL appears to be significantly better   Lab Results  Component Value Date   CHOL 166 10/18/2015   HDL 28.60* 10/18/2015   LDLCALC 168* 12/12/2014   LDLDIRECT 110.0 10/18/2015   TRIG 220.0* 10/18/2015   CHOLHDL 6 10/18/2015      Although he has some history of claudication he now appears to be getting more for numbness on walking rather than pain in his calf muscles on the left side   Examination:   BP 132/65 mmHg  Pulse 103  Ht 6\' 1"  (1.854 m)  Wt 246 lb (111.585 kg)  BMI 32.46 kg/m2  SpO2 96%  Body mass index is 32.46 kg/(m^2).    ASSESSMENT/ PLAN:   Diabetes type 2  See history of present illness for detailed discussion of his current management, blood sugar patterns and problems identified His blood sugars appear to be significantly better with switching Lantus to [place] without a change in dosage He does have periodic high readings fasting mostly appearing to be related to inconsistent diet the evening before Whenever he has done readings after supper day appear to be excellent with his continuing Victoza  Recommendations today:  More consistent diet  More readings after various meals including breakfast and lunch  No change in insulin as yet   HYPERLIPIDEMIA: His lipids appear to be improving and does need to be more consistent with diet, LDL now 110   Patient Instructions  Check blood sugars on waking up 3-4  times a week Also check blood sugars about 2 hours after a meal  and do this after different meals by rotation  Recommended blood sugar levels on waking up is 90-130 and about 2 hours after meal is 130-160  Please bring your blood sugar monitor to each visit, thank you      [facility] 10/23/2015, 12:58 PM

## 2015-10-23 NOTE — Patient Instructions (Signed)
Check blood sugars on waking up 3-4  times a week Also check blood sugars about 2 hours after a meal and do this after different meals by rotation  Recommended blood sugar levels on waking up is 90-130 and about 2 hours after meal is 130-160  Please bring your blood sugar monitor to each visit, thank you

## 2015-12-11 DIAGNOSIS — E782 Mixed hyperlipidemia: Secondary | ICD-10-CM | POA: Diagnosis not present

## 2015-12-11 DIAGNOSIS — Z794 Long term (current) use of insulin: Secondary | ICD-10-CM | POA: Diagnosis not present

## 2015-12-11 DIAGNOSIS — I10 Essential (primary) hypertension: Secondary | ICD-10-CM | POA: Diagnosis not present

## 2015-12-11 DIAGNOSIS — E1151 Type 2 diabetes mellitus with diabetic peripheral angiopathy without gangrene: Secondary | ICD-10-CM | POA: Diagnosis not present

## 2015-12-11 DIAGNOSIS — Z0000 Encounter for general adult medical examination without abnormal findings: Secondary | ICD-10-CM | POA: Diagnosis not present

## 2015-12-11 DIAGNOSIS — D649 Anemia, unspecified: Secondary | ICD-10-CM | POA: Diagnosis not present

## 2015-12-11 DIAGNOSIS — I739 Peripheral vascular disease, unspecified: Secondary | ICD-10-CM | POA: Diagnosis not present

## 2015-12-11 DIAGNOSIS — Z7984 Long term (current) use of oral hypoglycemic drugs: Secondary | ICD-10-CM | POA: Diagnosis not present

## 2015-12-19 ENCOUNTER — Other Ambulatory Visit (INDEPENDENT_AMBULATORY_CARE_PROVIDER_SITE_OTHER): Payer: Medicare HMO

## 2015-12-19 DIAGNOSIS — Z794 Long term (current) use of insulin: Secondary | ICD-10-CM | POA: Diagnosis not present

## 2015-12-19 DIAGNOSIS — E1165 Type 2 diabetes mellitus with hyperglycemia: Secondary | ICD-10-CM

## 2015-12-19 LAB — BASIC METABOLIC PANEL
BUN: 15 mg/dL (ref 6–23)
CHLORIDE: 102 meq/L (ref 96–112)
CO2: 29 mEq/L (ref 19–32)
CREATININE: 1.5 mg/dL (ref 0.40–1.50)
Calcium: 9.5 mg/dL (ref 8.4–10.5)
GFR: 58.28 mL/min — ABNORMAL LOW (ref >60.00)
Glucose, Bld: 138 mg/dL — ABNORMAL HIGH (ref 70–99)
POTASSIUM: 4.2 meq/L (ref 3.5–5.1)
SODIUM: 139 meq/L (ref 135–145)

## 2015-12-19 LAB — HEMOGLOBIN A1C: HEMOGLOBIN A1C: 7.7 % — AB (ref 4.6–6.5)

## 2015-12-22 ENCOUNTER — Ambulatory Visit: Payer: Medicare HMO | Admitting: Endocrinology

## 2015-12-26 ENCOUNTER — Encounter: Payer: Self-pay | Admitting: Endocrinology

## 2015-12-26 ENCOUNTER — Ambulatory Visit (INDEPENDENT_AMBULATORY_CARE_PROVIDER_SITE_OTHER): Payer: Medicare HMO | Admitting: Endocrinology

## 2015-12-26 VITALS — BP 148/70 | HR 91 | Ht 69.0 in | Wt 246.0 lb

## 2015-12-26 DIAGNOSIS — E1165 Type 2 diabetes mellitus with hyperglycemia: Secondary | ICD-10-CM | POA: Diagnosis not present

## 2015-12-26 DIAGNOSIS — Z794 Long term (current) use of insulin: Secondary | ICD-10-CM | POA: Diagnosis not present

## 2015-12-26 NOTE — Patient Instructions (Addendum)
Check blood sugars on waking up  3 days a week  Also check blood sugars about 2 hours after a meal and do this after different meals by rotation  Recommended blood sugar levels on waking up is 90-130 and about 2 hours after meal is 130-160  Please bring your blood sugar monitor to each visit, thank you  Do pairs of reading

## 2015-12-26 NOTE — Progress Notes (Signed)
Patient ID: Eric Long, male   DOB: Dec 13, 1940, 75 y.o.   MRN: [id number]   Reason for Appointment: Diabetes follow-up   History of Present Illness   Diagnosis: Type 2 DIABETES MELITUS, date of diagnosis: 2006      Previous history: He was previously treated with metformin, Amaryl and Januvia but subsequently had poor control with A1c of 11.8 in 2011 and 11.6 in 7/13 In 8/13 is Januvia was stopped and he was started on Victoza and Lantus insulin With this his blood sugars were improved significantly and his Amaryl was stopped. He also started losing weight His A1c in 6/14 was excellent at 6.3  Recent history:    Insulin regimen: Tresiba 25 units at bedtime         Non-insulin hypoglycemic drugs: Metformin 2 g daily, Victoza 1.8 mg daily        His A1c has been progressively higher the last year or so It is slightly better at 7.7 compared to 8.1 with switching from Lantus to [place]  Current management, blood sugar patterns and problems identified:   His blood sugars are more variable compared to his last visit including fasting readings  He has only a few sporadic readings at bedtime which are not high and does not think he is eating any snacks late at night to make his morning sugar higher  Also is very compliant with taking his [place] every night and Victoza during the day  Glucose is also mostly higher around lunchtime, he is mostly eating cereal and oatmeal morning  His weight has stayed the same despite continuing 1.8 mg Victoza  He has tried to exercise also more regularly  He does not consistently follow his diet as instructed a year ago including eating out        Monitors blood glucose:  about once a day .    Glucometer: [employer].          Blood Glucose readings from download:  Mean values apply above for all meters except median for One Touch  PRE-MEAL Fasting Lunch Dinner Bedtime Overall  Glucose range: 92-268  184-238  160-176  133, 146     Mean/median:     141+/-46    Physical activity: exercise: At gym With walking, Weight training; about 3 days a week   Dietician visit:  07/2014, has seen CDE in [id number]         Complications: are: Peripheral vascular disease, neuropathy     Wt Readings from Last 3 Encounters:  12/26/15 246 lb (111.6 kg)  10/23/15 246 lb (111.6 kg)  09/07/15 246 lb (111.6 kg)     LABS:  Lab Results  Component Value Date   HGBA1C 7.7 (H) 12/19/2015   HGBA1C 8.1 (H) 09/06/2015   HGBA1C 8.2 (H) 06/13/2015   Lab Results  Component Value Date   MICROALBUR 3.1 (H) 03/17/2015   LDLCALC 168 (H) 12/12/2014   CREATININE 1.50 12/19/2015   No visits with results within 1 Week(s) from this visit.  Latest known visit with results is:  Lab on 12/19/2015  Component Date Value Ref Range Status  . Hgb A1c MFr Bld 12/19/2015 7.7* 4.6 - 6.5 % Final  . Sodium 12/19/2015 139  135 - 145 mEq/L Final  . Potassium 12/19/2015 4.2  3.5 - 5.1 mEq/L Final  . Chloride 12/19/2015 102  96 - 112 mEq/L Final  . CO2 12/19/2015 29  19 - 32 mEq/L Final  . Glucose, Bld 12/19/2015  138* 70 - 99 mg/dL Final  . BUN 12/19/2015 15  6 - 23 mg/dL Final  . Creatinine, Ser 12/19/2015 1.50  0.40 - 1.50 mg/dL Final  . Calcium 12/19/2015 9.5  8.4 - 10.5 mg/dL Final  . GFR 12/19/2015 58.28* >60.00 mL/min Final         Medication List       Accurate as of 12/26/15  8:51 PM. Always use your most recent med list.          amlodipine-benazepril 2.5-10 MG capsule Commonly known as:  LOTREL Take 1 capsule by mouth daily.   glucose blood test strip Commonly known as:  ONETOUCH VERIO Use to check blood sugar 2 times per day. Dx code: E11.9   Insulin Pen Needle 32G X 4 MM Misc 2 each by Does not apply route 2 (two) times daily.   metFORMIN 500 MG tablet Commonly known as:  GLUCOPHAGE Take 2 tablets 2 times a day   metoprolol succinate 25 MG 24 hr tablet Commonly known as:  TOPROL-XL Take 25 mg by mouth daily.     rosuvastatin 40 MG tablet Commonly known as:  CRESTOR Take 40 mg by mouth daily.   TRESIBA FLEXTOUCH 100 UNIT/ML Sopn Generic drug:  Insulin Degludec 25 Units at bedtime.   VICTOZA 18 MG/3ML Sopn Generic drug:  Liraglutide INJECT 0.3 ML (1.8 MG TOTAL) INTO THE SKIN DAILY       Allergies: No Known Allergies  No past medical history on file.  Past Surgical History:  Procedure Laterality Date  . FEMORAL-FEMORAL BYPASS GRAFT Bilateral 2007    Family History  Problem Relation Age of Onset  . Diabetes Sister     Social History:  reports that he has quit smoking. He has never used smokeless tobacco. His alcohol and drug histories are not on file.  Review of Systems:  Hypertension:  Continues to take Lotrel and metoprolol, also followed by PCP  No recent change in mildly increased creatinine BP Readings from Last 3 Encounters:  12/26/15 (!) 148/70  10/23/15 132/65  09/07/15 140/72    Lab Results  Component Value Date   CREATININE 1.50 12/19/2015   BUN 15 12/19/2015   NA 139 12/19/2015   K 4.2 12/19/2015   CL 102 12/19/2015   CO2 29 12/19/2015     Lipids: Has had mixed hyperlipidemia and also significantly high LDL, last 176 He thinks he is  compliant with the CRESTOR and ZETIA   Recently LDL Is better but still above target    Lab Results  Component Value Date   CHOL 166 10/18/2015   HDL 28.60 (L) 10/18/2015   LDLCALC 168 (H) 12/12/2014   LDLDIRECT 110.0 10/18/2015   TRIG 220.0 (H) 10/18/2015   CHOLHDL 6 10/18/2015      Although he has some history of claudication he now appears to be getting more for numbness on walking rather than pain in his calf muscles on the left side   Examination:   BP (!) 148/70   Pulse 91   Ht 5\' 9"  (1.753 m)   Wt 246 lb (111.6 kg)   SpO2 95%   BMI 36.33 kg/m   Body mass index is 36.33 kg/m.    ASSESSMENT/ PLAN:   Diabetes type 2  See history of present illness for detailed discussion of his current  management, blood sugar patterns and problems identified Despite taking Tyler Aas his blood sugars are now higher than the last visit in A1c is still high at  7.7 He has not only his readings after meals  as much but does not clearly appear to be having postprandial hyperglycemia Has readings as high as 268 fasting which she cannot explain Lowest readings are early morning.  Recommendations today:  He was started checking postprandial readings more consistently.  He will use the form provided to monitor and to blood sugars before and after meals and write down what he is eating.  He will take this to the dietitian  More consistent diet, encouraged him to work harder on weight loss  More readings after various meals including breakfast and lunch  No change in insulin as some fasting readings are post   HYPERLIPIDEMIA: His lipids will need to be checked periodically  Renal insufficiency: Mild and stable  HYPERTENSION: Today has mild increase in systolic blood pressure, not usually high, continue to follow  Patient Instructions  Check blood sugars on waking up  3 days a week  Also check blood sugars about 2 hours after a meal and do this after different meals by rotation  Recommended blood sugar levels on waking up is 90-130 and about 2 hours after meal is 130-160  Please bring your blood sugar monitor to each visit, thank you  Do pairs of reading     Counseling time on subjects discussed above is over 50% of today's 25 minute visit    Georgine Wiltse 12/26/2015, 8:51 PM

## 2016-01-03 DIAGNOSIS — R69 Illness, unspecified: Secondary | ICD-10-CM | POA: Diagnosis not present

## 2016-01-25 ENCOUNTER — Telehealth: Payer: Self-pay | Admitting: Endocrinology

## 2016-01-25 ENCOUNTER — Other Ambulatory Visit: Payer: Self-pay | Admitting: *Deleted

## 2016-01-25 MED ORDER — LIRAGLUTIDE 18 MG/3ML ~~LOC~~ SOPN: PEN_INJECTOR | SUBCUTANEOUS | 3 refills | Status: DC

## 2016-01-25 MED ORDER — GLUCOSE BLOOD VI STRP: ORAL_STRIP | 3 refills | Status: DC

## 2016-01-25 MED ORDER — INSULIN PEN NEEDLE 32G X 4 MM MISC: 2.0000 | Freq: Two times a day (BID) | 3 refills | Status: DC

## 2016-01-25 NOTE — Telephone Encounter (Signed)
Rx sent, per Dr. Dwyane Dee he is only suppose to check 2 times per day, not 6

## 2016-01-25 NOTE — Telephone Encounter (Signed)
He only needs to check twice a day on an average at different times

## 2016-01-25 NOTE — Telephone Encounter (Signed)
Patient said he is testing 6 times per day, your note says once a day.  Please advise on how many to send in?

## 2016-01-25 NOTE — Telephone Encounter (Signed)
Need refill of glucose blood (ONETOUCH VERIO) test strip testing 6 times a day,  Insulin Pen Needle 32G X 4 MM MISC VICTOZA 18 MG/3ML Standard, [place] [phone] (Phone) [phone] (Fax)

## 2016-02-10 DIAGNOSIS — R69 Illness, unspecified: Secondary | ICD-10-CM | POA: Diagnosis not present

## 2016-02-21 DIAGNOSIS — E119 Type 2 diabetes mellitus without complications: Secondary | ICD-10-CM | POA: Diagnosis not present

## 2016-02-21 DIAGNOSIS — Z794 Long term (current) use of insulin: Secondary | ICD-10-CM | POA: Diagnosis not present

## 2016-02-21 DIAGNOSIS — H5203 Hypermetropia, bilateral: Secondary | ICD-10-CM | POA: Diagnosis not present

## 2016-02-21 DIAGNOSIS — H524 Presbyopia: Secondary | ICD-10-CM | POA: Diagnosis not present

## 2016-02-21 DIAGNOSIS — H40023 Open angle with borderline findings, high risk, bilateral: Secondary | ICD-10-CM | POA: Diagnosis not present

## 2016-02-21 DIAGNOSIS — H1851 Endothelial corneal dystrophy: Secondary | ICD-10-CM | POA: Diagnosis not present

## 2016-02-21 DIAGNOSIS — H2513 Age-related nuclear cataract, bilateral: Secondary | ICD-10-CM | POA: Diagnosis not present

## 2016-03-22 ENCOUNTER — Other Ambulatory Visit (INDEPENDENT_AMBULATORY_CARE_PROVIDER_SITE_OTHER): Payer: Medicare HMO

## 2016-03-22 DIAGNOSIS — Z794 Long term (current) use of insulin: Secondary | ICD-10-CM | POA: Diagnosis not present

## 2016-03-22 DIAGNOSIS — R69 Illness, unspecified: Secondary | ICD-10-CM | POA: Diagnosis not present

## 2016-03-22 DIAGNOSIS — E1165 Type 2 diabetes mellitus with hyperglycemia: Secondary | ICD-10-CM | POA: Diagnosis not present

## 2016-03-22 LAB — LIPID PANEL
CHOLESTEROL: 256 mg/dL — AB (ref 0–200)
HDL: 31.7 mg/dL — ABNORMAL LOW (ref >39.00)
NonHDL: 223.92
Total CHOL/HDL Ratio: 8
Triglycerides: 206 mg/dL — ABNORMAL HIGH (ref 0.0–149.0)
VLDL: 41.2 mg/dL — AB (ref 0.0–40.0)

## 2016-03-22 LAB — COMPREHENSIVE METABOLIC PANEL
ALBUMIN: 3.8 g/dL (ref 3.5–5.2)
ALK PHOS: 104 U/L (ref 39–117)
ALT: 13 U/L (ref 0–53)
AST: 12 U/L (ref 0–37)
BILIRUBIN TOTAL: 0.4 mg/dL (ref 0.2–1.2)
BUN: 17 mg/dL (ref 6–23)
CALCIUM: 9.4 mg/dL (ref 8.4–10.5)
CO2: 31 meq/L (ref 19–32)
Chloride: 101 mEq/L (ref 96–112)
Creatinine, Ser: 1.55 mg/dL — ABNORMAL HIGH (ref 0.40–1.50)
GFR: 56.08 mL/min — ABNORMAL LOW (ref >60.00)
Glucose, Bld: 104 mg/dL — ABNORMAL HIGH (ref 70–99)
Potassium: 4.7 mEq/L (ref 3.5–5.1)
Sodium: 139 mEq/L (ref 135–145)
TOTAL PROTEIN: 7.5 g/dL (ref 6.0–8.3)

## 2016-03-22 LAB — LDL CHOLESTEROL, DIRECT: Direct LDL: 187 mg/dL

## 2016-03-22 LAB — MICROALBUMIN / CREATININE URINE RATIO
CREATININE, U: 237.4 mg/dL
Microalb Creat Ratio: 0.8 mg/g (ref 0.0–30.0)
Microalb, Ur: 2 mg/dL — ABNORMAL HIGH (ref 0.0–1.9)

## 2016-03-22 LAB — HEMOGLOBIN A1C: Hgb A1c MFr Bld: 7.2 % — ABNORMAL HIGH (ref 4.6–6.5)

## 2016-03-27 ENCOUNTER — Encounter: Payer: Self-pay | Admitting: Endocrinology

## 2016-03-27 ENCOUNTER — Ambulatory Visit (INDEPENDENT_AMBULATORY_CARE_PROVIDER_SITE_OTHER): Payer: Medicare HMO | Admitting: Endocrinology

## 2016-03-27 ENCOUNTER — Other Ambulatory Visit: Payer: Self-pay

## 2016-03-27 VITALS — BP 130/70 | HR 95 | Ht 69.0 in | Wt 246.0 lb

## 2016-03-27 DIAGNOSIS — Z23 Encounter for immunization: Secondary | ICD-10-CM

## 2016-03-27 DIAGNOSIS — I10 Essential (primary) hypertension: Secondary | ICD-10-CM | POA: Diagnosis not present

## 2016-03-27 DIAGNOSIS — E1165 Type 2 diabetes mellitus with hyperglycemia: Secondary | ICD-10-CM | POA: Diagnosis not present

## 2016-03-27 DIAGNOSIS — N183 Chronic kidney disease, stage 3 unspecified: Secondary | ICD-10-CM

## 2016-03-27 DIAGNOSIS — Z794 Long term (current) use of insulin: Secondary | ICD-10-CM

## 2016-03-27 DIAGNOSIS — E782 Mixed hyperlipidemia: Secondary | ICD-10-CM

## 2016-03-27 MED ORDER — EZETIMIBE 10 MG PO TABS: 10.0000 mg | ORAL_TABLET | Freq: Every day | ORAL | 3 refills | Status: DC

## 2016-03-27 NOTE — Addendum Note (Signed)
Addended by: Nile Riggs on: 03/27/2016 11:24 AM   Modules accepted: Orders

## 2016-03-27 NOTE — Patient Instructions (Addendum)
Check blood sugars on waking up 2-3x per week   Also check blood sugars about 2 hours after a meal and do this after different meals by rotation  Recommended blood sugar levels on waking up is 90-130 and about 2 hours after meal is 130-160  Please bring your blood sugar monitor to each visit, thank you  Cut out sweet tea and high carb foods  Chewable 81mg  aspirin

## 2016-03-27 NOTE — Progress Notes (Signed)
Patient ID: Eric Long, male   DOB: 07/02/1940, 75 y.o.   MRN: [id number]   Reason for Appointment: Diabetes follow-up   History of Present Illness   Diagnosis: Type 2 DIABETES MELITUS, date of diagnosis: 2006      Previous history: He was previously treated with metformin, Amaryl and Januvia but subsequently had poor control with A1c of 11.8 in 2011 and 11.6 in 7/13 In 8/13 is Januvia was stopped and he was started on Victoza and Lantus insulin With this his blood sugars were improved significantly and his Amaryl was stopped. He also started losing weight His A1c in 6/14 was excellent at 6.3  Recent history:    Insulin regimen: Tresiba 25 units at bedtime         Non-insulin hypoglycemic drugs: Metformin 2 g daily, Victoza 1.8 mg daily        A1c is slightly better at 7.2 and has been gradually improving, has been as high as 8.1 this year  Current management, blood sugar patterns and problems identified:   He was told to start keeping a record of his food and check blood sugars 2 hours after eating and report this to the dietitian  However he has not made appointment with dietitian but is bringing a record of his food intake and will spend a blood sugars  He has periodic readings over 200 after meals but these are primarily when he is eating sweets, eating fried food or drinking sweet tea  He has started making a few changes in his diet but not consistently and still has some high readings at times, checking about 2 hours after various meals  He has not lost any weight but has not exercised for 3 weeks because of shoulder pain  However fasting readings recently appear to be better and not saturating as much  He refuses to see the dietitian now   Has been compliant with Victoza and Tresiba       Monitors blood glucose:  about once a day .    Glucometer: One [employer].          Blood Glucose readings from download:  Mean values apply above for all meters  except median for One Touch  PRE-MEAL Fasting Lunch Dinner Bedtime Overall  Glucose range: 134, 140       Mean/median:     138   POST-MEAL PC Breakfast PC Lunch PC Dinner  Glucose range: 194  125-174  140-206   Mean/median:       Physical activity: exercise: None for 3 weeks, was at gym with walking, Weight training; about 3 days a week   Dietician visit:  07/2014, has seen CDE in [id number]         Complications: are: Peripheral vascular disease, neuropathy     Wt Readings from Last 3 Encounters:  03/27/16 246 lb (111.6 kg)  12/26/15 246 lb (111.6 kg)  10/23/15 246 lb (111.6 kg)     LABS:  Lab Results  Component Value Date   HGBA1C 7.2 (H) 03/22/2016   HGBA1C 7.7 (H) 12/19/2015   HGBA1C 8.1 (H) 09/06/2015   Lab Results  Component Value Date   MICROALBUR 2.0 (H) 03/22/2016   LDLCALC 168 (H) 12/12/2014   CREATININE 1.55 (H) 03/22/2016   Lab on 03/22/2016  Component Date Value Ref Range Status  . Hgb A1c MFr Bld 03/22/2016 7.2* 4.6 - 6.5 % Final  . Sodium 03/22/2016 139  135 - 145 mEq/L Final  .  Potassium 03/22/2016 4.7  3.5 - 5.1 mEq/L Final  . Chloride 03/22/2016 101  96 - 112 mEq/L Final  . CO2 03/22/2016 31  19 - 32 mEq/L Final  . Glucose, Bld 03/22/2016 104* 70 - 99 mg/dL Final  . BUN 03/22/2016 17  6 - 23 mg/dL Final  . Creatinine, Ser 03/22/2016 1.55* 0.40 - 1.50 mg/dL Final  . Total Bilirubin 03/22/2016 0.4  0.2 - 1.2 mg/dL Final  . Alkaline Phosphatase 03/22/2016 104  39 - 117 U/L Final  . AST 03/22/2016 12  0 - 37 U/L Final  . ALT 03/22/2016 13  0 - 53 U/L Final  . Total Protein 03/22/2016 7.5  6.0 - 8.3 g/dL Final  . Albumin 03/22/2016 3.8  3.5 - 5.2 g/dL Final  . Calcium 03/22/2016 9.4  8.4 - 10.5 mg/dL Final  . GFR 03/22/2016 56.08* >60.00 mL/min Final  . Microalb, Ur 03/22/2016 2.0* 0.0 - 1.9 mg/dL Final  . Creatinine,U 03/22/2016 237.4  mg/dL Final  . Microalb Creat Ratio 03/22/2016 0.8  0.0 - 30.0 mg/g Final  . Cholesterol 03/22/2016 256* 0 - 200  mg/dL Final  . Triglycerides 03/22/2016 206.0* 0.0 - 149.0 mg/dL Final  . HDL 03/22/2016 31.70* >39.00 mg/dL Final  . VLDL 03/22/2016 41.2* 0.0 - 40.0 mg/dL Final  . Total CHOL/HDL Ratio 03/22/2016 8   Final  . NonHDL 03/22/2016 223.92   Final  . Direct LDL 03/22/2016 187.0  mg/dL Final         Medication List       Accurate as of 03/27/16  9:26 AM. Always use your most recent med list.          amlodipine-benazepril 2.5-10 MG capsule Commonly known as:  LOTREL Take 1 capsule by mouth daily.   ezetimibe 10 MG tablet Commonly known as:  ZETIA Take 1 tablet (10 mg total) by mouth daily.   glucose blood test strip Commonly known as:  ONETOUCH VERIO Use to check blood sugar 2 times per day by rotation of meals. Dx code: E11.9   Insulin Pen Needle 32G X 4 MM Misc 2 each by Does not apply route 2 (two) times daily.   liraglutide 18 MG/3ML Sopn Commonly known as:  VICTOZA INJECT 0.3 ML (1.8 MG TOTAL) INTO THE SKIN DAILY   metFORMIN 500 MG tablet Commonly known as:  GLUCOPHAGE Take 2 tablets 2 times a day   metoprolol succinate 25 MG 24 hr tablet Commonly known as:  TOPROL-XL Take 25 mg by mouth daily.   rosuvastatin 40 MG tablet Commonly known as:  CRESTOR Take 40 mg by mouth daily.   TRESIBA FLEXTOUCH 100 UNIT/ML Sopn FlexTouch Pen Generic drug:  insulin degludec 25 Units at bedtime.       Allergies: No Known Allergies  No past medical history on file.  Past Surgical History:  Procedure Laterality Date  . FEMORAL-FEMORAL BYPASS GRAFT Bilateral 2007    Family History  Problem Relation Age of Onset  . Diabetes Sister     Social History:  reports that he has quit smoking. He has never used smokeless tobacco. His alcohol and drug histories are not on file.  Review of Systems:  Hypertension:  Compliant and is on Lotrel and metoprolol, also followed by PCP   No recent change in mildly increased creatinine  BP Readings from Last 3 Encounters:    03/27/16 130/70  12/26/15 (!) 148/70  10/23/15 132/65    Lab Results  Component Value Date   CREATININE  1.55 (H) 03/22/2016   BUN 17 03/22/2016   NA 139 03/22/2016   K 4.7 03/22/2016   CL 101 03/22/2016   CO2 31 03/22/2016     Lipids: Has had mixed hyperlipidemia and also significantly high LDL, last 176 He thinks he is  compliant with the CRESTOR But apparently has not been taking Zetia for some time He did not refill this and not clear when he last took it  Recently LDL much higher than usual    Lab Results  Component Value Date   CHOL 256 (H) 03/22/2016   HDL 31.70 (L) 03/22/2016   LDLCALC 168 (H) 12/12/2014   LDLDIRECT 187.0 03/22/2016   TRIG 206.0 (H) 03/22/2016   CHOLHDL 8 03/22/2016      Again has difficulty feeling numbness in the left lower leg when walking, he feels decreased sensation rather than pain when he tries to walk about 4-5 minutes Has not followed up with PCP regarding this   Examination:   BP 130/70   Pulse 95   Ht 5\' 9"  (1.753 m)   Wt 246 lb (111.6 kg)   SpO2 95%   BMI 36.33 kg/m   Body mass index is 36.33 kg/m.    ASSESSMENT/ PLAN:   Diabetes type 2  See history of present illness for detailed discussion of his current management, blood sugar patterns and problems identified  Although his A1c 7.2 he still has periodic high postprandial readings He has been able to pinpoint what kind of foods and drinks are making his sugars go up and is trying to improve his diet but still not optimal Also has not lost any weight Discussed and Victoza is not controlling his postprandial readings unless he watches his diet consistently Recent fasting readings appear to be fairly good with 25 units of Tresiba.  Recommendations today: He will improve his diet with eliminating certain foods and drinks as above Consistently monitor 2 hours postprandial readings Call if these are consistently high. Also call if fasting readings are consistently  high Resume exercise when able to   HYPERLIPIDEMIA: He will restart Zetia Discussed LDL targets, need to continue 40 mg Crestor His lipids will need to be checked again on next visit  Renal insufficiency: Mild increase in creatinine but creatinine clearance is still almost the same  HYPERTENSION: Well controlled  Claudication: He will discuss this with PCP and consider vascular surgery consultation He will try chewable aspirin as he thinks that oral aspirin causes diarrhea  Influenza vaccine given  Patient Instructions  Check blood sugars on waking up 2-3x per week   Also check blood sugars about 2 hours after a meal and do this after different meals by rotation  Recommended blood sugar levels on waking up is 90-130 and about 2 hours after meal is 130-160  Please bring your blood sugar monitor to each visit, thank you  Cut out sweet tea and high carb foods  Chewable 81mg  aspirin  Counseling time on subjects discussed above is over 50% of today's 25 minute visit    Chizuko Trine 03/27/2016, 9:26 AM

## 2016-06-01 DIAGNOSIS — Z6834 Body mass index (BMI) 34.0-34.9, adult: Secondary | ICD-10-CM | POA: Diagnosis not present

## 2016-06-01 DIAGNOSIS — E1165 Type 2 diabetes mellitus with hyperglycemia: Secondary | ICD-10-CM | POA: Diagnosis not present

## 2016-06-01 DIAGNOSIS — E784 Other hyperlipidemia: Secondary | ICD-10-CM | POA: Diagnosis not present

## 2016-06-01 DIAGNOSIS — E1151 Type 2 diabetes mellitus with diabetic peripheral angiopathy without gangrene: Secondary | ICD-10-CM | POA: Diagnosis not present

## 2016-06-01 DIAGNOSIS — E669 Obesity, unspecified: Secondary | ICD-10-CM | POA: Diagnosis not present

## 2016-06-01 DIAGNOSIS — Z0000 Encounter for general adult medical examination without abnormal findings: Secondary | ICD-10-CM | POA: Diagnosis not present

## 2016-06-01 DIAGNOSIS — R609 Edema, unspecified: Secondary | ICD-10-CM | POA: Diagnosis not present

## 2016-06-01 DIAGNOSIS — Z599 Problem related to housing and economic circumstances, unspecified: Secondary | ICD-10-CM | POA: Diagnosis not present

## 2016-06-01 DIAGNOSIS — K08409 Partial loss of teeth, unspecified cause, unspecified class: Secondary | ICD-10-CM | POA: Diagnosis not present

## 2016-06-01 DIAGNOSIS — I10 Essential (primary) hypertension: Secondary | ICD-10-CM | POA: Diagnosis not present

## 2016-06-03 ENCOUNTER — Other Ambulatory Visit: Payer: Self-pay | Admitting: Endocrinology

## 2016-06-24 ENCOUNTER — Other Ambulatory Visit (INDEPENDENT_AMBULATORY_CARE_PROVIDER_SITE_OTHER): Payer: Medicare HMO

## 2016-06-24 DIAGNOSIS — E1165 Type 2 diabetes mellitus with hyperglycemia: Secondary | ICD-10-CM | POA: Diagnosis not present

## 2016-06-24 DIAGNOSIS — Z794 Long term (current) use of insulin: Secondary | ICD-10-CM | POA: Diagnosis not present

## 2016-06-24 LAB — COMPREHENSIVE METABOLIC PANEL
ALBUMIN: 3.9 g/dL (ref 3.5–5.2)
ALT: 14 U/L (ref 0–53)
AST: 14 U/L (ref 0–37)
Alkaline Phosphatase: 102 U/L (ref 39–117)
BUN: 19 mg/dL (ref 6–23)
CHLORIDE: 96 meq/L (ref 96–112)
CO2: 29 meq/L (ref 19–32)
Calcium: 9.5 mg/dL (ref 8.4–10.5)
Creatinine, Ser: 1.67 mg/dL — ABNORMAL HIGH (ref 0.40–1.50)
GFR: 51.42 mL/min — ABNORMAL LOW (ref >60.00)
Glucose, Bld: 154 mg/dL — ABNORMAL HIGH (ref 70–99)
POTASSIUM: 4.4 meq/L (ref 3.5–5.1)
SODIUM: 134 meq/L — AB (ref 135–145)
Total Bilirubin: 0.6 mg/dL (ref 0.2–1.2)
Total Protein: 7.9 g/dL (ref 6.0–8.3)

## 2016-06-24 LAB — LIPID PANEL
Cholesterol: 270 mg/dL — ABNORMAL HIGH (ref 0–200)
HDL: 26.8 mg/dL — AB (ref >39.00)
NONHDL: 243.22
Total CHOL/HDL Ratio: 10
Triglycerides: 234 mg/dL — ABNORMAL HIGH (ref 0.0–149.0)
VLDL: 46.8 mg/dL — AB (ref 0.0–40.0)

## 2016-06-24 LAB — HEMOGLOBIN A1C: HEMOGLOBIN A1C: 8.3 % — AB (ref 4.6–6.5)

## 2016-06-24 LAB — LDL CHOLESTEROL, DIRECT: LDL DIRECT: 186 mg/dL

## 2016-06-27 ENCOUNTER — Ambulatory Visit: Payer: Medicare HMO | Admitting: Endocrinology

## 2016-06-27 DIAGNOSIS — B029 Zoster without complications: Secondary | ICD-10-CM | POA: Diagnosis not present

## 2016-07-18 ENCOUNTER — Encounter: Payer: Self-pay | Admitting: Endocrinology

## 2016-07-18 ENCOUNTER — Ambulatory Visit (INDEPENDENT_AMBULATORY_CARE_PROVIDER_SITE_OTHER): Payer: Medicare HMO | Admitting: Endocrinology

## 2016-07-18 VITALS — BP 168/80 | HR 98 | Ht 70.0 in | Wt 238.0 lb

## 2016-07-18 DIAGNOSIS — N183 Chronic kidney disease, stage 3 unspecified: Secondary | ICD-10-CM

## 2016-07-18 DIAGNOSIS — Z794 Long term (current) use of insulin: Secondary | ICD-10-CM | POA: Diagnosis not present

## 2016-07-18 DIAGNOSIS — E1165 Type 2 diabetes mellitus with hyperglycemia: Secondary | ICD-10-CM | POA: Diagnosis not present

## 2016-07-18 DIAGNOSIS — I10 Essential (primary) hypertension: Secondary | ICD-10-CM

## 2016-07-18 DIAGNOSIS — E782 Mixed hyperlipidemia: Secondary | ICD-10-CM

## 2016-07-18 NOTE — Patient Instructions (Signed)
Check blood sugars on waking up  3x weekly  Also check blood sugars about 2 hours after a meal and do this after different meals by rotation  Recommended blood sugar levels on waking up is 90-130 and about 2 hours after meal is 130-160  Please bring your blood sugar monitor to each visit, thank you  Make sure you take Zetia and Crestor DAILY

## 2016-07-18 NOTE — Progress Notes (Addendum)
Patient ID: Eric Long, male   DOB: Feb 17, 1941, 76 y.o.   MRN: [id number]   Reason for Appointment: Diabetes follow-up   History of Present Illness   Diagnosis: Type 2 DIABETES MELITUS, date of diagnosis: 2006      Previous history: He was previously treated with metformin, Amaryl and Januvia but subsequently had poor control with A1c of 11.8 in 2011 and 11.6 in 7/13 In 8/13 is Januvia was stopped and he was started on Victoza and Lantus insulin With this his blood sugars were improved significantly and his Amaryl was stopped. He also started losing weight His A1c in 6/14 was excellent at 6.3  Recent history:    Insulin regimen: Tresiba 25 units at bedtime         Non-insulin hypoglycemic drugs: Metformin 2 g daily, Victoza 1.8 mg daily        A1c is much higher at 8.3%, previously was slightly better at 7.2   Current management, blood sugar patterns and problems identified:   He has not checked his blood sugars since 06/29/16  He says that he has been having issues with bilateral infections as well as shingles and has not paying attention to his diabetes much  Not clear if his blood sugars are still high but he was having readings as high as 333 last month fasting and may have had some steroids also given initially  Does not have a consistent pattern and did have a couple of readings in the morning of 130 +  Also not clear if he was consistently taking his injectable medications when he was having intercurrent illnesses  He has still not started back walking even though his shingles pain is improving       Monitors blood glucose:  about once a day .    Glucometer: One [employer].          Blood Glucose readings from download as above, no readings since 2/24:  Median in 2/17 from download = 154  Previous fasting glucose 154 from lab last month  Physical activity: exercise: None for a few weeks, was at gym with walking, Weight training; about 3 days a week    Dietician visit:  07/2014, has seen CDE in 9/62         Complications: are: Peripheral vascular disease, neuropathy     Wt Readings from Last 3 Encounters:  07/18/16 238 lb (108 kg)  03/27/16 246 lb (111.6 kg)  12/26/15 246 lb (111.6 kg)     LABS:  Lab Results  Component Value Date   HGBA1C 8.3 (H) 06/24/2016   HGBA1C 7.2 (H) 03/22/2016   HGBA1C 7.7 (H) 12/19/2015   Lab Results  Component Value Date   MICROALBUR 2.0 (H) 03/22/2016   LDLCALC 168 (H) 12/12/2014   CREATININE 1.67 (H) 06/24/2016   No visits with results within 1 Week(s) from this visit.  Latest known visit with results is:  Lab on 06/24/2016  Component Date Value Ref Range Status  . Hgb A1c MFr Bld 06/24/2016 8.3* 4.6 - 6.5 % Final  . Sodium 06/24/2016 134* 135 - 145 mEq/L Final  . Potassium 06/24/2016 4.4  3.5 - 5.1 mEq/L Final  . Chloride 06/24/2016 96  96 - 112 mEq/L Final  . CO2 06/24/2016 29  19 - 32 mEq/L Final  . Glucose, Bld 06/24/2016 154* 70 - 99 mg/dL Final  . BUN 06/24/2016 19  6 - 23 mg/dL Final  . Creatinine, Ser 06/24/2016 1.67* 0.40 -  1.50 mg/dL Final  . Total Bilirubin 06/24/2016 0.6  0.2 - 1.2 mg/dL Final  . Alkaline Phosphatase 06/24/2016 102  39 - 117 U/L Final  . AST 06/24/2016 14  0 - 37 U/L Final  . ALT 06/24/2016 14  0 - 53 U/L Final  . Total Protein 06/24/2016 7.9  6.0 - 8.3 g/dL Final  . Albumin 06/24/2016 3.9  3.5 - 5.2 g/dL Final  . Calcium 06/24/2016 9.5  8.4 - 10.5 mg/dL Final  . GFR 06/24/2016 51.42* >60.00 mL/min Final  . Cholesterol 06/24/2016 270* 0 - 200 mg/dL Final  . Triglycerides 06/24/2016 234.0* 0.0 - 149.0 mg/dL Final  . HDL 06/24/2016 26.80* >39.00 mg/dL Final  . VLDL 06/24/2016 46.8* 0.0 - 40.0 mg/dL Final  . Total CHOL/HDL Ratio 06/24/2016 10   Final  . NonHDL 06/24/2016 243.22   Final  . Direct LDL 06/24/2016 186.0  mg/dL Final       Allergies as of 07/18/2016      Reactions   Aspirin    Diarrhea      Medication List       Accurate as of  07/18/16  5:03 PM. Always use your most recent med list.          amlodipine-benazepril 2.5-10 MG capsule Commonly known as:  LOTREL Take 1 capsule by mouth daily.   ezetimibe 10 MG tablet Commonly known as:  ZETIA Take 1 tablet (10 mg total) by mouth daily.   glucose blood test strip Commonly known as:  ONETOUCH VERIO Use to check blood sugar 2 times per day by rotation of meals. Dx code: E11.9   Insulin Pen Needle 32G X 4 MM Misc 2 each by Does not apply route 2 (two) times daily.   liraglutide 18 MG/3ML Sopn Commonly known as:  VICTOZA INJECT 0.3 ML (1.8 MG TOTAL) INTO THE SKIN DAILY   metFORMIN 500 MG tablet Commonly known as:  GLUCOPHAGE Take 2 tablets 2 times a day   metoprolol succinate 25 MG 24 hr tablet Commonly known as:  TOPROL-XL Take 25 mg by mouth daily.   rosuvastatin 40 MG tablet Commonly known as:  CRESTOR Take 40 mg by mouth daily.   TRESIBA FLEXTOUCH 100 UNIT/ML Sopn FlexTouch Pen Generic drug:  insulin degludec 25 Units at bedtime.   TRESIBA FLEXTOUCH 100 UNIT/ML Sopn FlexTouch Pen Generic drug:  insulin degludec INJECT 25 UNITS SUBCUTANEOUSLY ONCE DAILY       Allergies:  Allergies  Allergen Reactions  . Aspirin     Diarrhea    No past medical history on file.  Past Surgical History:  Procedure Laterality Date  . FEMORAL-FEMORAL BYPASS GRAFT Bilateral 2007    Family History  Problem Relation Age of Onset  . Diabetes Sister     Social History:  reports that he has quit smoking. He has never used smokeless tobacco. His alcohol and drug histories are not on file.  Review of Systems:  Hypertension:  Compliant and is on Lotrel and metoprolol, also followed by PCP  Blood pressure is higher today   BP Readings from Last 3 Encounters:  07/18/16 (!) 168/80  03/27/16 130/70  12/26/15 (!) 148/70   He tends to have mild renal dysfunction  Lab Results  Component Value Date   CREATININE 1.67 (H) 06/24/2016   BUN 19 06/24/2016    NA 134 (L) 06/24/2016   K 4.4 06/24/2016   CL 96 06/24/2016   CO2 29 06/24/2016     Lipids: Has had  mixed hyperlipidemia and also significantly high LDL, last 176 He thinks he is  compliant with the CRESTOR  This prescription was apparently written by his PCP and not clear if he is getting refills regularly He had a prescription for Zetia with 3 refills in November and he still thinks he has a medication at home even though he should have run out by now  Again LDL is significantly high at 186   Lab Results  Component Value Date   CHOL 270 (H) 06/24/2016   HDL 26.80 (L) 06/24/2016   LDLCALC 168 (H) 12/12/2014   LDLDIRECT 186.0 06/24/2016   TRIG 234.0 (H) 06/24/2016   CHOLHDL 10 06/24/2016      Again has difficulty feeling numbness in the left lower leg when walking, he feels decreased sensation rather than pain when he tries to walk about 4-5 minutes Has not followed up with PCP regarding this   Examination:   BP (!) 168/80   Pulse 98   Ht 5\' 10"  (1.778 m)   Wt 238 lb (108 kg)   SpO2 96%   BMI 34.15 kg/m   Body mass index is 34.15 kg/m.    ASSESSMENT/ PLAN:   Diabetes type 2  See history of present illness for detailed discussion of his current management, blood sugar patterns and problems identified He has poor control recently with A1c going up over 8% This is partly related to intercurrent illness Also not clear if he is now starting to have postprandial hyperglycemia consistently He has not monitored his blood sugars for 3 weeks Discussed implications of her I5W  Recommendations:  More consistent monitoring before and after meals especially evening meal  Consider starting Humalog at suppertime at least if blood sugars consistently high  Restart walking  Need to have them watch carbohydrate, fat intake and portions consistently    HYPERLIPIDEMIA:  Most likely he is noncompliant with his medications especially Zetia He will restart Zetia Told him to  bring his medication bottles for review on each visit to review compliance  Discussed LDL targets, need to make sure he has refills for Crestor Also discussed he may need an injectable drugs like Repatha if his lipids are not controlled with consistent compliance with oral medications His lipids will need to be checked again on next visit  Renal insufficiency: Persistent increase in creatinine and he needs to follow-up with PCP about this  HYPERTENSION: Blood pressure is relatively high, advised him to follow-up with PCP    Patient Instructions  Check blood sugars on waking up  3x weekly  Also check blood sugars about 2 hours after a meal and do this after different meals by rotation  Recommended blood sugar levels on waking up is 90-130 and about 2 hours after meal is 130-160  Please bring your blood sugar monitor to each visit, thank you  Make sure you take Zetia and Crestor DAILY   Counseling time on subjects discussed above is over 50% of today's 25 minute visit    Aniya Jolicoeur 07/18/2016, 5:03 PM

## 2016-07-22 ENCOUNTER — Telehealth: Payer: Self-pay

## 2016-07-22 NOTE — Telephone Encounter (Signed)
This has been resolved

## 2016-07-22 NOTE — Telephone Encounter (Signed)
[employer] calling to state patient is currently late to fill and wants wants to know if he is being given samples or if he is not filling because the med is too expensive she has tried to reach the patient but has been unsuccessful- please advise

## 2016-07-22 NOTE — Telephone Encounter (Signed)
Please make a reference to the medication you're talking about.  You need to call the patient for the information

## 2016-08-15 ENCOUNTER — Encounter: Payer: Self-pay | Admitting: Endocrinology

## 2016-08-15 ENCOUNTER — Ambulatory Visit (INDEPENDENT_AMBULATORY_CARE_PROVIDER_SITE_OTHER): Payer: Medicare HMO | Admitting: Endocrinology

## 2016-08-15 VITALS — BP 118/58 | HR 100 | Ht 70.0 in | Wt 236.0 lb

## 2016-08-15 DIAGNOSIS — E1165 Type 2 diabetes mellitus with hyperglycemia: Secondary | ICD-10-CM | POA: Diagnosis not present

## 2016-08-15 DIAGNOSIS — Z794 Long term (current) use of insulin: Secondary | ICD-10-CM | POA: Diagnosis not present

## 2016-08-15 NOTE — Patient Instructions (Signed)
Check blood sugars on waking up  2-3x weekly   Also check blood sugars about 2 hours after a meal and do this after different meals by rotation  Recommended blood sugar levels on waking up is 90-130 and about 2 hours after meal is 130-160  Please bring your blood sugar monitor to each visit, thank you  If sugar stays >140 in am go to 28 tresiba

## 2016-08-15 NOTE — Progress Notes (Signed)
Patient ID: Eric Long, male   DOB: 05/10/1940, 76 y.o.   MRN: [id number]   Reason for Appointment: Diabetes follow-up   History of Present Illness   Diagnosis: Type 2 DIABETES MELITUS, date of diagnosis: 2006      Previous history: He was previously treated with metformin, Amaryl and Januvia but subsequently had poor control with A1c of 11.8 in 2011 and 11.6 in 7/13 In 8/13 is Januvia was stopped and he was started on Victoza and Lantus insulin With this his blood sugars were improved significantly and his Amaryl was stopped. He also started losing weight His A1c in 6/14 was excellent at 6.3  Recent history:    Insulin regimen: Tresiba 25 units at bedtime         Non-insulin hypoglycemic drugs: Metformin 2 g daily, Victoza 1.8 mg daily        A1c in 2/18 was higher than usual at 8.3  Current management, blood sugar patterns and problems identified:   He has come back for short-term follow-up because of not having any home glucose monitoring on his last visit and worsening blood sugar control  His blood sugars may have been higher because of his shingles and other intercurrent illnesses  More recently however he has on his own checked his blood sugars more often after meals especially in the afternoon or evening but not consistently again  Since he has found his blood sugar to be high at times he is trying to eat more smaller and frequent meals but is still eating a larger meal at dinner  He thinks his blood sugars are higher when he is eating sweets or desserts  Has had readings after meals over 200 twice but high readings are not consistent even at night  FASTING blood sugar has been checked only once and this was 245, he does not know why was high  He has also just started back on exercise at the gym and has lost 2 pounds  He thinks he is compliant usually with his injectable drugs       Meal times: Breakfast 9 AM, dinner-7-8 PM  Monitors blood glucose:   about once a day .    Glucometer: One Prob[employer].          Blood Glucose readings from download  Mean values apply above for all meters except median for One Touch  PRE-MEAL Fasting Lunch Afternoon  Bedtime Overall  Glucose range: 245   118-188  115-249   Mean/median:  128  145  158 149     Previous fasting glucose 154 from lab last month  Physical activity: exercise:  at gym with walking, Weight training; about 3 days a week   Dietician visit:  07/2014, has seen CDE in 9/48         Complications: are: Peripheral vascular disease, neuropathy     Wt Readings from Last 3 Encounters:  08/15/16 236 lb (107 kg)  07/18/16 238 lb (108 kg)  03/27/16 246 lb (111.6 kg)     LABS:  Lab Results  Component Value Date   HGBA1C 8.3 (H) 06/24/2016   HGBA1C 7.2 (H) 03/22/2016   HGBA1C 7.7 (H) 12/19/2015   Lab Results  Component Value Date   MICROALBUR 2.0 (H) 03/22/2016   LDLCALC 168 (H) 12/12/2014   CREATININE 1.67 (H) 06/24/2016   No visits with results within 1 Week(s) from this visit.  Latest known visit with results is:  Lab on 06/24/2016  Component  Date Value Ref Range Status  . Hgb A1c MFr Bld 06/24/2016 8.3* 4.6 - 6.5 % Final  . Sodium 06/24/2016 134* 135 - 145 mEq/L Final  . Potassium 06/24/2016 4.4  3.5 - 5.1 mEq/L Final  . Chloride 06/24/2016 96  96 - 112 mEq/L Final  . CO2 06/24/2016 29  19 - 32 mEq/L Final  . Glucose, Bld 06/24/2016 154* 70 - 99 mg/dL Final  . BUN 06/24/2016 19  6 - 23 mg/dL Final  . Creatinine, Ser 06/24/2016 1.67* 0.40 - 1.50 mg/dL Final  . Total Bilirubin 06/24/2016 0.6  0.2 - 1.2 mg/dL Final  . Alkaline Phosphatase 06/24/2016 102  39 - 117 U/L Final  . AST 06/24/2016 14  0 - 37 U/L Final  . ALT 06/24/2016 14  0 - 53 U/L Final  . Total Protein 06/24/2016 7.9  6.0 - 8.3 g/dL Final  . Albumin 06/24/2016 3.9  3.5 - 5.2 g/dL Final  . Calcium 06/24/2016 9.5  8.4 - 10.5 mg/dL Final  . GFR 06/24/2016 51.42* >60.00 mL/min Final  . Cholesterol  06/24/2016 270* 0 - 200 mg/dL Final  . Triglycerides 06/24/2016 234.0* 0.0 - 149.0 mg/dL Final  . HDL 06/24/2016 26.80* >39.00 mg/dL Final  . VLDL 06/24/2016 46.8* 0.0 - 40.0 mg/dL Final  . Total CHOL/HDL Ratio 06/24/2016 10   Final  . NonHDL 06/24/2016 243.22   Final  . Direct LDL 06/24/2016 186.0  mg/dL Final       Allergies as of 08/15/2016      Reactions   Aspirin    Diarrhea      Medication List       Accurate as of 08/15/16  5:07 PM. Always use your most recent med list.          amlodipine-benazepril 2.5-10 MG capsule Commonly known as:  LOTREL Take 1 capsule by mouth daily.   ezetimibe 10 MG tablet Commonly known as:  ZETIA Take 1 tablet (10 mg total) by mouth daily.   glucose blood test strip Commonly known as:  ONETOUCH VERIO Use to check blood sugar 2 times per day by rotation of meals. Dx code: E11.9   Insulin Pen Needle 32G X 4 MM Misc 2 each by Does not apply route 2 (two) times daily.   liraglutide 18 MG/3ML Sopn Commonly known as:  VICTOZA INJECT 0.3 ML (1.8 MG TOTAL) INTO THE SKIN DAILY   metFORMIN 500 MG tablet Commonly known as:  GLUCOPHAGE Take 2 tablets 2 times a day   metoprolol succinate 25 MG 24 hr tablet Commonly known as:  TOPROL-XL Take 25 mg by mouth daily.   rosuvastatin 40 MG tablet Commonly known as:  CRESTOR Take 40 mg by mouth daily.   TRESIBA FLEXTOUCH 100 UNIT/ML Sopn FlexTouch Pen Generic drug:  insulin degludec 25 Units at bedtime.   TRESIBA FLEXTOUCH 100 UNIT/ML Sopn FlexTouch Pen Generic drug:  insulin degludec INJECT 25 UNITS SUBCUTANEOUSLY ONCE DAILY       Allergies:  Allergies  Allergen Reactions  . Aspirin     Diarrhea    No past medical history on file.  Past Surgical History:  Procedure Laterality Date  . FEMORAL-FEMORAL BYPASS GRAFT Bilateral 2007    Family History  Problem Relation Age of Onset  . Diabetes Sister     Social History:  reports that he has quit smoking. He has never used  smokeless tobacco. His alcohol and drug histories are not on file.  Review of Systems:  Hypertension:  Compliant and is on Lotrel and metoprolol, also followed by PCP  Blood pressure is Lower today   BP Readings from Last 3 Encounters:  08/15/16 (!) 118/58  07/18/16 (!) 168/80  03/27/16 130/70   He tends to have mild renal dysfunction, Persistent  Lab Results  Component Value Date   CREATININE 1.67 (H) 06/24/2016   BUN 19 06/24/2016   NA 134 (L) 06/24/2016   K 4.4 06/24/2016   CL 96 06/24/2016   CO2 29 06/24/2016     Lipids: Has had mixed hyperlipidemia and also significantly high LDL, last one 86 He thinks he is compliant with the CRESTOR   He had a prescription for Zetia with 3 refills in November and was likely not refilling this He has his medication bottles today and his last refills were on April 1 Has not had a follow-up with PCP recently   Lab Results  Component Value Date   CHOL 270 (H) 06/24/2016   HDL 26.80 (L) 06/24/2016   LDLCALC 168 (H) 12/12/2014   LDLDIRECT 186.0 06/24/2016   TRIG 234.0 (H) 06/24/2016   CHOLHDL 10 06/24/2016      He has had a feeling of numbness in the left lower leg when walking, he feels decreased sensation rather than pain when he tries to walk about 4-5 minutes   Examination:   BP (!) 118/58   Pulse 100   Ht 5\' 10"  (1.778 m)   Wt 236 lb (107 kg)   BMI 33.86 kg/m   Body mass index is 33.86 kg/m.    ASSESSMENT/ PLAN:   Diabetes type 2  See history of present illness for detailed discussion of his current management, blood sugar patterns and problems identified  Although his blood sugars are not consistently controlled they appear to be relatively better with only sporadic high readings after meals Previously A1c 8.3% He has just started exercising Also his trying to be more consistent with watching his carbohydrates and sweets and he is able to identify foods that are raising his blood  sugars  Recommendations:  More consistent monitoring before and after meals   Also will need to periodically check fasting readings as he has only one reading which was unusually high and  discussed possibly adjusting his [place] Aas if fasting readings are consistently high    HYPERLIPIDEMIA:  Most likely he is not always getting his refills on time and since he is now aching both his Crestor and Zetia we will see what his labs on the next visit   Patient Instructions  Check blood sugars on waking up  2-3x weekly   Also check blood sugars about 2 hours after a meal and do this after different meals by rotation  Recommended blood sugar levels on waking up is 90-130 and about 2 hours after meal is 130-160  Please bring your blood sugar monitor to each visit, thank you  If sugar stays >140 in am go to 28 tresiba      [facility] 08/15/2016, 5:07 PM

## 2016-10-11 ENCOUNTER — Other Ambulatory Visit (INDEPENDENT_AMBULATORY_CARE_PROVIDER_SITE_OTHER): Payer: Medicare HMO

## 2016-10-11 DIAGNOSIS — E782 Mixed hyperlipidemia: Secondary | ICD-10-CM

## 2016-10-11 DIAGNOSIS — E1165 Type 2 diabetes mellitus with hyperglycemia: Secondary | ICD-10-CM

## 2016-10-11 DIAGNOSIS — Z794 Long term (current) use of insulin: Secondary | ICD-10-CM | POA: Diagnosis not present

## 2016-10-11 LAB — LIPID PANEL
CHOLESTEROL: 227 mg/dL — AB (ref 0–200)
HDL: 28.6 mg/dL — ABNORMAL LOW (ref >39.00)
NonHDL: 198.41
Total CHOL/HDL Ratio: 8
Triglycerides: 279 mg/dL — ABNORMAL HIGH (ref 0.0–149.0)
VLDL: 55.8 mg/dL — ABNORMAL HIGH (ref 0.0–40.0)

## 2016-10-11 LAB — COMPREHENSIVE METABOLIC PANEL
ALBUMIN: 3.6 g/dL (ref 3.5–5.2)
ALK PHOS: 92 U/L (ref 39–117)
ALT: 18 U/L (ref 0–53)
AST: 15 U/L (ref 0–37)
BUN: 15 mg/dL (ref 6–23)
CO2: 29 mEq/L (ref 19–32)
CREATININE: 1.26 mg/dL (ref 0.40–1.50)
Calcium: 9.2 mg/dL (ref 8.4–10.5)
Chloride: 103 mEq/L (ref 96–112)
GFR: 71.12 mL/min (ref >60.00)
Glucose, Bld: 163 mg/dL — ABNORMAL HIGH (ref 70–99)
Potassium: 4.1 mEq/L (ref 3.5–5.1)
SODIUM: 138 meq/L (ref 135–145)
TOTAL PROTEIN: 7.1 g/dL (ref 6.0–8.3)
Total Bilirubin: 0.2 mg/dL (ref 0.2–1.2)

## 2016-10-11 LAB — HEMOGLOBIN A1C: Hgb A1c MFr Bld: 7.9 % — ABNORMAL HIGH (ref 4.6–6.5)

## 2016-10-11 LAB — LDL CHOLESTEROL, DIRECT: Direct LDL: 168 mg/dL

## 2016-10-14 NOTE — Progress Notes (Signed)
Patient ID: Eric Long, male   DOB: [birth year], 76 y.o.   MRN: [id number]   Reason for Appointment: Diabetes follow-up   History of Present Illness   Diagnosis: Type 2 DIABETES MELITUS, date of diagnosis: 2006      Previous history: He was previously treated with metformin, Amaryl and Januvia but subsequently had poor control with A1c of 11.8 in 2011 and 11.6 in 7/13 In 8/13 is Januvia was stopped and he was started on Victoza and Lantus insulin With this his blood sugars were improved significantly and his Amaryl was stopped. He also started losing weight His A1c in 6/14 was excellent at 6.3  Recent history:    Insulin regimen: Tresiba 25 units at bedtime         Non-insulin hypoglycemic drugs: Metformin 2 g daily, Victoza 1.8 mg daily        A1c in 2/18 was higher than usual at 8.3 and is now 7.9   Current management, blood sugar patterns and problems identified:   He was asked to try to check his sugars more often after meals when his A1c was higher  Also was advised to try and watch his diet better  However he has gained weight since his last visit  Has sporadic high readings after meals although not consistent and may be based on his type of meal and carbohydrate intake; blood sugars are more consistently higher after supper  FASTING blood sugars are mildly high overall but only has a few readings  He says he does not like to go to the gym as he tends to have soreness in his muscles with the workout but he does not try to do much aerobic activity.  His diet has been inconsistent again, was seen previously 2 years ago by the dietitian       Meal times: Breakfast 9 AM, dinner-7-8 PM  Monitors blood glucose:  about once a day .    Glucometer: One [employer].          Blood Glucose readings from download  Mean values apply above for all meters except median for One Touch  PRE-MEAL Fasting Lunch Dinner Bedtime Overall  Glucose range: 109-143         Mean/median: 124     159    POST-MEAL PC Breakfast PC Lunch PC Dinner  Glucose range:  1 25-219  1 42-219  Mean/median: 152  159   173     Physical activity: exercise:  at gym with walking, Weight training; about 2-3 days a week   Dietician visit:  07/2014, has seen CDE in 4/12         Complications: are: Peripheral vascular disease, neuropathy     Wt Readings from Last 3 Encounters:  10/15/16 241 lb (109.3 kg)  08/15/16 236 lb (107 kg)  07/18/16 238 lb (108 kg)     LABS:  Lab Results  Component Value Date   HGBA1C 7.9 (H) 10/11/2016   HGBA1C 8.3 (H) 06/24/2016   HGBA1C 7.2 (H) 03/22/2016   Lab Results  Component Value Date   MICROALBUR 2.0 (H) 03/22/2016   LDLCALC 168 (H) 12/12/2014   CREATININE 1.26 10/11/2016   Lab on 10/11/2016  Component Date Value Ref Range Status  . Hgb A1c MFr Bld 10/11/2016 7.9* 4.6 - 6.5 % Final   Glycemic Control Guidelines for People with Diabetes:Non Diabetic:  <6%Goal of Therapy: <7%Additional Action Suggested:  >8%   . Sodium 10/11/2016 138  135 -  145 mEq/L Final  . Potassium 10/11/2016 4.1  3.5 - 5.1 mEq/L Final  . Chloride 10/11/2016 103  96 - 112 mEq/L Final  . CO2 10/11/2016 29  19 - 32 mEq/L Final  . Glucose, Bld 10/11/2016 163* 70 - 99 mg/dL Final  . BUN 10/11/2016 15  6 - 23 mg/dL Final  . Creatinine, Ser 10/11/2016 1.26  0.40 - 1.50 mg/dL Final  . Total Bilirubin 10/11/2016 0.2  0.2 - 1.2 mg/dL Final  . Alkaline Phosphatase 10/11/2016 92  39 - 117 U/L Final  . AST 10/11/2016 15  0 - 37 U/L Final  . ALT 10/11/2016 18  0 - 53 U/L Final  . Total Protein 10/11/2016 7.1  6.0 - 8.3 g/dL Final  . Albumin 10/11/2016 3.6  3.5 - 5.2 g/dL Final  . Calcium 10/11/2016 9.2  8.4 - 10.5 mg/dL Final  . GFR 10/11/2016 71.12  >60.00 mL/min Final  . Cholesterol 10/11/2016 227* 0 - 200 mg/dL Final   ATP III Classification       Desirable:  < 200 mg/dL               Borderline High:  200 - 239 mg/dL          High:  > = 240 mg/dL  .  Triglycerides 10/11/2016 279.0* 0.0 - 149.0 mg/dL Final   Normal:  <150 mg/dLBorderline High:  150 - 199 mg/dL  . HDL 10/11/2016 28.60* >39.00 mg/dL Final  . VLDL 10/11/2016 55.8* 0.0 - 40.0 mg/dL Final  . Total CHOL/HDL Ratio 10/11/2016 8   Final                  Men          Women1/2 Average Risk     3.4          3.3Average Risk          5.0          4.42X Average Risk          9.6          7.13X Average Risk          15.0          11.0                      . NonHDL 10/11/2016 198.41   Final   NOTE:  Non-HDL goal should be 30 mg/dL higher than patient's LDL goal (i.e. LDL goal of < 70 mg/dL, would have non-HDL goal of < 100 mg/dL)  . Direct LDL 10/11/2016 168.0  mg/dL Final   Optimal:  <100 mg/dLNear or Above Optimal:  100-129 mg/dLBorderline High:  130-159 mg/dLHigh:  160-189 mg/dLVery High:  >190 mg/dL       Allergies as of 10/15/2016      Reactions   Aspirin    Diarrhea      Medication List       Accurate as of 10/15/16 12:22 PM. Always use your most recent med list.          amlodipine-benazepril 2.5-10 MG capsule Commonly known as:  LOTREL Take 1 capsule by mouth daily.   ezetimibe 10 MG tablet Commonly known as:  ZETIA Take 1 tablet (10 mg total) by mouth daily.   glucose blood test strip Commonly known as:  ONETOUCH VERIO Use to check blood sugar 2 times per day by rotation of meals. Dx code: E11.9   Insulin Pen Needle 32G X  4 MM Misc 2 each by Does not apply route 2 (two) times daily.   liraglutide 18 MG/3ML Sopn Commonly known as:  VICTOZA INJECT 0.3 ML (1.8 MG TOTAL) INTO THE SKIN DAILY   metFORMIN 500 MG tablet Commonly known as:  GLUCOPHAGE Take 2 tablets 2 times a day   metoprolol succinate 25 MG 24 hr tablet Commonly known as:  TOPROL-XL Take 25 mg by mouth daily.   rosuvastatin 40 MG tablet Commonly known as:  CRESTOR Take 40 mg by mouth daily.   TRESIBA FLEXTOUCH 100 UNIT/ML Sopn FlexTouch Pen Generic drug:  insulin degludec 25 Units at  bedtime.   TRESIBA FLEXTOUCH 100 UNIT/ML Sopn FlexTouch Pen Generic drug:  insulin degludec INJECT 25 UNITS SUBCUTANEOUSLY ONCE DAILY       Allergies:  Allergies  Allergen Reactions  . Aspirin     Diarrhea    No past medical history on file.  Past Surgical History:  Procedure Laterality Date  . FEMORAL-FEMORAL BYPASS GRAFT Bilateral 2007    Family History  Problem Relation Age of Onset  . Diabetes Sister     Social History:  reports that he has quit smoking. He has never used smokeless tobacco. His alcohol and drug histories are not on file.  Review of Systems:  Hypertension:  Compliant and is on Lotrel and metoprolol, also followed by PCP  Blood pressure is Still relatively high   BP Readings from Last 3 Encounters:  10/15/16 (!) 152/68  08/15/16 (!) 118/58  07/18/16 (!) 168/80   He tends to have mild renal dysfunction, Better now   Lab Results  Component Value Date   CREATININE 1.26 10/11/2016   BUN 15 10/11/2016   NA 138 10/11/2016   K 4.1 10/11/2016   CL 103 10/11/2016   CO2 29 10/11/2016     Lipids: Has had mixed hyperlipidemia and also significantly high LDL He thinks he is compliant with the [place] but his LDL is still significantly high   Lab Results  Component Value Date   CHOL 227 (H) 10/11/2016   HDL 28.60 (L) 10/11/2016   LDLCALC 168 (H) 12/12/2014   LDLDIRECT 168.0 10/11/2016   TRIG 279.0 (H) 10/11/2016   CHOLHDL 8 10/11/2016      He has had a feeling of numbness in the left lower leg when walking, he feels decreased sensation rather than pain when he tries to walk about 4-5 minutes   Examination:   BP (!) 152/68   Pulse 97   Wt 241 lb (109.3 kg)   SpO2 95%   BMI 34.58 kg/m   Body mass index is 34.58 kg/m.    ASSESSMENT/ PLAN:   Diabetes type 2  See history of present illness for detailed discussion of his current management, blood sugar patterns and problems identified  He is on a regimen of metformin,  Victoza and basal insulin  He still has tendency to postprandial hyperglycemia A1c is slightly better at 7.9 but not at target   Recommendations:  More consistent monitoring before breakfast and after meals   He needs to cut back on carbohydrate intake at certain meals and make sure he is following low-fat diet  He will review his diet with nutritionist along with his wife to have consistent meal plan  He can start walking the evenings on his own outdoors instead of going to the gym, this may help his evening hyperglycemia also  Follow-up in 3 months  Call if blood sugars are consistently high  Hypertension: He will follow-up with his PCP  HYPERLIPIDEMIA: He will be checking the coverage for Repatha, needs to have more aggressive therapy   Patient Instructions  Walk daily in pms  Low carb meals   Check coverage for Repatha     [facility] 10/15/2016, 12:22 PM

## 2016-10-15 ENCOUNTER — Encounter: Payer: Self-pay | Admitting: Endocrinology

## 2016-10-15 ENCOUNTER — Ambulatory Visit (INDEPENDENT_AMBULATORY_CARE_PROVIDER_SITE_OTHER): Payer: Medicare HMO | Admitting: Endocrinology

## 2016-10-15 VITALS — BP 152/68 | HR 97 | Wt 241.0 lb

## 2016-10-15 DIAGNOSIS — E782 Mixed hyperlipidemia: Secondary | ICD-10-CM | POA: Diagnosis not present

## 2016-10-15 DIAGNOSIS — E1165 Type 2 diabetes mellitus with hyperglycemia: Secondary | ICD-10-CM | POA: Diagnosis not present

## 2016-10-15 DIAGNOSIS — Z794 Long term (current) use of insulin: Secondary | ICD-10-CM | POA: Diagnosis not present

## 2016-10-15 NOTE — Patient Instructions (Addendum)
Walk daily in pms  Low carb meals   Check coverage for Repatha

## 2016-10-17 ENCOUNTER — Encounter: Payer: Medicare HMO | Admitting: Dietician

## 2016-10-21 ENCOUNTER — Encounter: Payer: Self-pay | Admitting: Dietician

## 2016-10-21 ENCOUNTER — Encounter: Payer: Medicare HMO | Attending: Endocrinology | Admitting: Dietician

## 2016-10-21 DIAGNOSIS — Z794 Long term (current) use of insulin: Secondary | ICD-10-CM | POA: Insufficient documentation

## 2016-10-21 DIAGNOSIS — E1165 Type 2 diabetes mellitus with hyperglycemia: Secondary | ICD-10-CM | POA: Insufficient documentation

## 2016-10-21 DIAGNOSIS — Z713 Dietary counseling and surveillance: Secondary | ICD-10-CM | POA: Diagnosis not present

## 2016-10-21 DIAGNOSIS — E119 Type 2 diabetes mellitus without complications: Secondary | ICD-10-CM

## 2016-10-21 NOTE — Progress Notes (Signed)
Diabetes Self-Management Education  Visit Type: Follow-up He was last seen by an RD here 2 years ago.  Appt. Start Time: 0800 Appt. End Time: 0900  10/21/2016  Mr. Eric Long, identified by name and date of birth, is a 76 y.o. male with a diagnosis of Diabetes:  [place] Kitchen Type 2.  Patient is here today with his wife.  He has had type 2 DM since 2006.  Current medications include 25 units Treseba, Victoza, and Metformin.  Of note:  Patient is a late night person with a variable sleep schedule often staying awake until 7 am, eating breakfast then sleeping for up to 12 hours.  He eats no pork except bacon and is agreeable to decrease this.  He eats no chicken, fish, very little [place] and eats few vegetables including a rare salad.  His diet consists of mostly beef, eggs, bacon, bread, a little fruit, and chips.  He does not drink water. He has a Building services engineer but does not go.  Patient lives with his wife.  She does much of the shopping and cooking but patient's schedule makes this difficult.  He buys what ever he wants as well.  Diet is often 2 meals per day and increased cheetos, pretzels and chips.  We discussed choosing a light meal rather than one of the snacks as well as reading the labels regarding snacks.  Also discussed ways to increase activity slowly.  ASSESSMENT  Weight 237 lb (107.5 kg). Body mass index is 34.01 kg/m.      Diabetes Self-Management Education - 10/21/16 0813      Visit Information   Visit Type Follow-up     Complications   How often do you check your blood sugar? 1-2 times/day   Fasting Blood glucose range (mg/dL) 70-129   Postprandial Blood glucose range (mg/dL) 130-179     Dietary Intake   Breakfast plain cheerios occasionally with banana and 2% milk OR 2 slices toast with butter and occasionally jelly OR eggs and 2 strips bacon, toast  7-10   Snack (morning) none   Lunch rare bologna and cheese sandwich on [employer] bread- SKIPS lunch most often  12-1   Snack  (afternoon) chips or pretzels or cheetos, fruit cup   Dinner 2 hot dogs on buns with baked beans OR hamburger on bun OR country style steak and mashed potatoes OR spaghetti with meat sauce  5:30-7   Snack (evening) cheetos OR ice cream OR fruit cup   Beverage(s) coffee with splenda, cream, OJ occasionally, water seldom, diet soda, sweet tea     Exercise   Exercise Type ADL's      Individualized Plan for Diabetes Self-Management Training:   Learning Objective:  Patient will have a greater understanding of diabetes self-management. Patient education plan is to attend individual and/or group sessions per assessed needs and concerns.   Plan:   Patient Instructions  Consider checking your blood sugar at least twice per day. (am and 2 hours after you start a meal.) Buy fruit cup without sugar.  Choose fresh instead such as berries more often. Have a small amount of protein with each meal and snack.  See snack list. Read labels.  Consider sticking to the portion size. Very lean meat, limit frying, consider reducing portion size (1 strip bacon). Consider more consistency of meal schedule. Consider multivitamin daily. Watch your portion sizes of carbohydrates.     Aim for 3-4 Carb Choices per meal (45-60 grams) +/- 1 either way  Aim for  0-1 Carbs per snack if hungry  Consider  increasing your activity level by walking for 15 minutes daily as tolerated Continue taking your medication.     Expected Outcomes:   Patient with some interest.  Question changes that patient will make.  Education material provided: Food label handouts, A1C conversion sheet, Meal plan card, My Plate and Snack sheet  If problems or questions, patient to contact team via:  Phone  Future DSME appointment:

## 2016-10-21 NOTE — Patient Instructions (Addendum)
Consider checking your blood sugar at least twice per day. (am and 2 hours after you start a meal.) Buy fruit cup without sugar.  Choose fresh instead such as berries more often. Have a small amount of protein with each meal and snack.  See snack list. Read labels.  Consider sticking to the portion size. Very lean meat, limit frying, consider reducing portion size (1 strip bacon). Consider more consistency of meal schedule. Consider multivitamin daily. Watch your portion sizes of carbohydrates.     Aim for 3-4 Carb Choices per meal (45-60 grams) +/- 1 either way  Aim for 0-1 Carbs per snack if hungry  Consider  increasing your activity level by walking for 15 minutes daily as tolerated Continue taking your medication.

## 2016-10-22 ENCOUNTER — Other Ambulatory Visit: Payer: Self-pay | Admitting: Endocrinology

## 2016-10-22 DIAGNOSIS — E1165 Type 2 diabetes mellitus with hyperglycemia: Secondary | ICD-10-CM

## 2016-10-22 DIAGNOSIS — Z794 Long term (current) use of insulin: Principal | ICD-10-CM

## 2016-11-18 ENCOUNTER — Other Ambulatory Visit: Payer: Self-pay | Admitting: Endocrinology

## 2016-11-19 ENCOUNTER — Other Ambulatory Visit: Payer: Self-pay

## 2016-11-19 MED ORDER — LIRAGLUTIDE 18 MG/3ML ~~LOC~~ SOPN: PEN_INJECTOR | SUBCUTANEOUS | 3 refills | Status: DC

## 2016-12-25 DIAGNOSIS — I739 Peripheral vascular disease, unspecified: Secondary | ICD-10-CM | POA: Diagnosis not present

## 2016-12-25 DIAGNOSIS — Z0000 Encounter for general adult medical examination without abnormal findings: Secondary | ICD-10-CM | POA: Diagnosis not present

## 2016-12-25 DIAGNOSIS — E782 Mixed hyperlipidemia: Secondary | ICD-10-CM | POA: Diagnosis not present

## 2016-12-25 DIAGNOSIS — I10 Essential (primary) hypertension: Secondary | ICD-10-CM | POA: Diagnosis not present

## 2016-12-25 DIAGNOSIS — E1151 Type 2 diabetes mellitus with diabetic peripheral angiopathy without gangrene: Secondary | ICD-10-CM | POA: Diagnosis not present

## 2016-12-25 DIAGNOSIS — Z7984 Long term (current) use of oral hypoglycemic drugs: Secondary | ICD-10-CM | POA: Diagnosis not present

## 2016-12-25 DIAGNOSIS — Z23 Encounter for immunization: Secondary | ICD-10-CM | POA: Diagnosis not present

## 2017-01-20 ENCOUNTER — Other Ambulatory Visit: Payer: Medicare HMO

## 2017-01-23 ENCOUNTER — Ambulatory Visit: Payer: Medicare HMO | Admitting: Endocrinology

## 2017-01-29 ENCOUNTER — Other Ambulatory Visit: Payer: Medicare HMO

## 2017-01-29 ENCOUNTER — Other Ambulatory Visit (INDEPENDENT_AMBULATORY_CARE_PROVIDER_SITE_OTHER): Payer: Medicare HMO

## 2017-01-29 DIAGNOSIS — E1165 Type 2 diabetes mellitus with hyperglycemia: Secondary | ICD-10-CM | POA: Diagnosis not present

## 2017-01-29 DIAGNOSIS — Z794 Long term (current) use of insulin: Secondary | ICD-10-CM

## 2017-01-29 LAB — COMPREHENSIVE METABOLIC PANEL
ALBUMIN: 3.7 g/dL (ref 3.5–5.2)
ALK PHOS: 97 U/L (ref 39–117)
ALT: 13 U/L (ref 0–53)
AST: 14 U/L (ref 0–37)
BILIRUBIN TOTAL: 0.3 mg/dL (ref 0.2–1.2)
BUN: 16 mg/dL (ref 6–23)
CO2: 29 mEq/L (ref 19–32)
Calcium: 9.3 mg/dL (ref 8.4–10.5)
Chloride: 101 mEq/L (ref 96–112)
Creatinine, Ser: 1.41 mg/dL (ref 0.40–1.50)
GFR: 62.41 mL/min (ref >60.00)
Glucose, Bld: 190 mg/dL — ABNORMAL HIGH (ref 70–99)
POTASSIUM: 4.1 meq/L (ref 3.5–5.1)
SODIUM: 137 meq/L (ref 135–145)
TOTAL PROTEIN: 7.4 g/dL (ref 6.0–8.3)

## 2017-01-29 LAB — MICROALBUMIN / CREATININE URINE RATIO
CREATININE, U: 165 mg/dL
MICROALB/CREAT RATIO: 1 mg/g (ref 0.0–30.0)
Microalb, Ur: 1.7 mg/dL (ref 0.0–1.9)

## 2017-01-29 LAB — LIPID PANEL
Cholesterol: 258 mg/dL — ABNORMAL HIGH (ref 0–200)
HDL: 28.1 mg/dL — AB (ref >39.00)
NONHDL: 230.35
Total CHOL/HDL Ratio: 9
Triglycerides: 327 mg/dL — ABNORMAL HIGH (ref 0.0–149.0)
VLDL: 65.4 mg/dL — ABNORMAL HIGH (ref 0.0–40.0)

## 2017-01-29 LAB — LDL CHOLESTEROL, DIRECT: LDL DIRECT: 178 mg/dL

## 2017-01-29 LAB — HEMOGLOBIN A1C: HEMOGLOBIN A1C: 7.9 % — AB (ref 4.6–6.5)

## 2017-02-04 ENCOUNTER — Ambulatory Visit (INDEPENDENT_AMBULATORY_CARE_PROVIDER_SITE_OTHER): Payer: Medicare HMO | Admitting: Endocrinology

## 2017-02-04 ENCOUNTER — Encounter: Payer: Self-pay | Admitting: Endocrinology

## 2017-02-04 VITALS — BP 148/80 | HR 100 | Ht 70.0 in | Wt 241.2 lb

## 2017-02-04 DIAGNOSIS — E1165 Type 2 diabetes mellitus with hyperglycemia: Secondary | ICD-10-CM | POA: Diagnosis not present

## 2017-02-04 DIAGNOSIS — E782 Mixed hyperlipidemia: Secondary | ICD-10-CM

## 2017-02-04 DIAGNOSIS — Z794 Long term (current) use of insulin: Secondary | ICD-10-CM

## 2017-02-04 MED ORDER — INSULIN ASPART 100 UNIT/ML FLEXPEN: PEN_INJECTOR | SUBCUTANEOUS | 1 refills | Status: DC

## 2017-02-04 MED ORDER — ALIROCUMAB 75 MG/ML ~~LOC~~ SOPN: 75.0000 mg | PEN_INJECTOR | SUBCUTANEOUS | 3 refills | Status: DC

## 2017-02-04 NOTE — Progress Notes (Signed)
Patient ID: Eric Long, male   DOB: [birth year], 76 y.o.   MRN: [id number]   Reason for Appointment: Diabetes follow-up   History of Present Illness   Diagnosis: Type 2 DIABETES MELITUS, date of diagnosis: 2006      Previous history: He was previously treated with metformin, Amaryl and Januvia but subsequently had poor control with A1c of 11.8 in 2011 and 11.6 in 7/13 In 8/13 is Januvia was stopped and he was started on Victoza and Lantus insulin With this his blood sugars were improved significantly and his Amaryl was stopped. He also started losing weight His A1c in 6/14 was excellent at 6.3  Recent history:    Insulin regimen: Tresiba 24 units at bedtime         Non-insulin hypoglycemic drugs: Metformin 2 g daily, Victoza 1.8 mg daily        His A1c has been consistently around 8% and now the same as last time at 7.9  Current management, blood sugar patterns and problems identified:   He did not bring his monitor for download  However he thinks that his blood sugars are consistently high after evening meal around 200  Although he is trying to do a little better with his diet he has not lost any weight since last time  He is not motivated to do any exercise and this was discussed in the last visit, he does have a membership at the gym  FASTING blood sugars are looking higher than before and mostly over 150 at home  He has not had any difficulty with taking his diabetes regimen including Victoza as before        Meal times: Breakfast 9 AM, dinner-7-8 PM  Monitors blood glucose:  about once a day .    Glucometer: One [employer].          Blood Glucose readings from recall:  Mean values apply above for all meters except median for One Touch  PRE-MEAL Fasting Lunch Dinner Bedtime Overall  Glucose range: 165   200   Mean/median:         Physical activity: exercise:  at gym with walking, Weight training; about 2-3 days a week   Dietician visit:  07/2014,  has seen CDE in 2/44         Complications: are: Peripheral vascular disease, neuropathy     Wt Readings from Last 3 Encounters:  02/04/17 241 lb 3.2 oz (109.4 kg)  10/21/16 237 lb (107.5 kg)  10/15/16 241 lb (109.3 kg)     LABS:  Lab Results  Component Value Date   HGBA1C 7.9 (H) 01/29/2017   HGBA1C 7.9 (H) 10/11/2016   HGBA1C 8.3 (H) 06/24/2016   Lab Results  Component Value Date   MICROALBUR 1.7 01/29/2017   LDLCALC 168 (H) 12/12/2014   CREATININE 1.41 01/29/2017   Lab on 01/29/2017  Component Date Value Ref Range Status  . Sodium 01/29/2017 137  135 - 145 mEq/L Final  . Potassium 01/29/2017 4.1  3.5 - 5.1 mEq/L Final  . Chloride 01/29/2017 101  96 - 112 mEq/L Final  . CO2 01/29/2017 29  19 - 32 mEq/L Final  . Glucose, Bld 01/29/2017 190* 70 - 99 mg/dL Final  . BUN 01/29/2017 16  6 - 23 mg/dL Final  . Creatinine, Ser 01/29/2017 1.41  0.40 - 1.50 mg/dL Final  . Total Bilirubin 01/29/2017 0.3  0.2 - 1.2 mg/dL Final  . Alkaline Phosphatase 01/29/2017 97  39 -  117 U/L Final  . AST 01/29/2017 14  0 - 37 U/L Final  . ALT 01/29/2017 13  0 - 53 U/L Final  . Total Protein 01/29/2017 7.4  6.0 - 8.3 g/dL Final  . Albumin 01/29/2017 3.7  3.5 - 5.2 g/dL Final  . Calcium 01/29/2017 9.3  8.4 - 10.5 mg/dL Final  . GFR 01/29/2017 62.41  >60.00 mL/min Final  . Hgb A1c MFr Bld 01/29/2017 7.9* 4.6 - 6.5 % Final   Glycemic Control Guidelines for People with Diabetes:Non Diabetic:  <6%Goal of Therapy: <7%Additional Action Suggested:  >8%   . Cholesterol 01/29/2017 258* 0 - 200 mg/dL Final   ATP III Classification       Desirable:  < 200 mg/dL               Borderline High:  200 - 239 mg/dL          High:  > = 240 mg/dL  . Triglycerides 01/29/2017 327.0* 0.0 - 149.0 mg/dL Final   Normal:  <150 mg/dLBorderline High:  150 - 199 mg/dL  . HDL 01/29/2017 28.10* >39.00 mg/dL Final  . VLDL 01/29/2017 65.4* 0.0 - 40.0 mg/dL Final  . Total CHOL/HDL Ratio 01/29/2017 9   Final                   Men          Women1/2 Average Risk     3.4          3.3Average Risk          5.0          4.42X Average Risk          9.6          7.13X Average Risk          15.0          11.0                      . NonHDL 01/29/2017 230.35   Final   NOTE:  Non-HDL goal should be 30 mg/dL higher than patient's LDL goal (i.e. LDL goal of < 70 mg/dL, would have non-HDL goal of < 100 mg/dL)  . Microalb, Ur 01/29/2017 1.7  0.0 - 1.9 mg/dL Final  . Creatinine,U 01/29/2017 165.0  mg/dL Final  . Microalb Creat Ratio 01/29/2017 1.0  0.0 - 30.0 mg/g Final  . Direct LDL 01/29/2017 178.0  mg/dL Final   Optimal:  <100 mg/dLNear or Above Optimal:  100-129 mg/dLBorderline High:  130-159 mg/dLHigh:  160-189 mg/dLVery High:  >190 mg/dL       Allergies as of 02/04/2017      Reactions   Aspirin    Diarrhea      Medication List       Accurate as of 02/04/17  8:15 AM. Always use your most recent med list.          amlodipine-benazepril 2.5-10 MG capsule Commonly known as:  LOTREL Take 1 capsule by mouth daily.   ezetimibe 10 MG tablet Commonly known as:  ZETIA Take 1 tablet (10 mg total) by mouth daily.   glucose blood test strip Commonly known as:  ONETOUCH VERIO Use to check blood sugar 2 times per day by rotation of meals. Dx code: E11.9   Insulin Pen Needle 32G X 4 MM Misc 2 each by Does not apply route 2 (two) times daily.   liraglutide 18 MG/3ML Sopn Commonly known as:  VICTOZA INJECT 0.3 MLS  (1.8 MG TOTAL) INTO THE SKIN ONCE A DAY   metFORMIN 500 MG tablet Commonly known as:  GLUCOPHAGE Take 2 tablets 2 times a day   metoprolol succinate 25 MG 24 hr tablet Commonly known as:  TOPROL-XL Take 25 mg by mouth daily.   rosuvastatin 40 MG tablet Commonly known as:  CRESTOR Take 40 mg by mouth daily.   TRESIBA FLEXTOUCH 100 UNIT/ML Sopn FlexTouch Pen Generic drug:  insulin degludec 25 Units at bedtime.       Allergies:  Allergies  Allergen Reactions  . Aspirin     Diarrhea    Past  Medical History:  Diagnosis Date  . Diabetes mellitus without complication ([place])   . Hyperlipidemia     Past Surgical History:  Procedure Laterality Date  . FEMORAL-FEMORAL BYPASS GRAFT Bilateral 2007    Family History  Problem Relation Age of Onset  . Diabetes Sister     Social History:  reports that he has quit smoking. He has never used smokeless tobacco. His alcohol and drug histories are not on file.  Review of Systems:  Hypertension:  Compliant and is on Lotrel and metoprolol, also followed by PCP  Blood pressure is Generally not well controlled   BP Readings from Last 3 Encounters:  02/04/17 (!) 148/80  10/15/16 (!) 152/68  08/15/16 (!) 118/58    His creatinine is upper normal usually   Lab Results  Component Value Date   CREATININE 1.41 01/29/2017   BUN 16 01/29/2017   NA 137 01/29/2017   K 4.1 01/29/2017   CL 101 01/29/2017   CO2 29 01/29/2017     Lipids: Has had mixed hyperlipidemia and also significantly high LDL He Has persistently high LDL value 7 also now triglycerides Recently he did run out of Crestor but otherwise taking it regularly with Zetia He was asked to check on his insurance coverage for Repatha but he did not do so     Lab Results  Component Value Date   CHOL 258 (H) 01/29/2017   HDL 28.10 (L) 01/29/2017   LDLCALC 168 (H) 12/12/2014   LDLDIRECT 178.0 01/29/2017   TRIG 327.0 (H) 01/29/2017   CHOLHDL 9 01/29/2017      He has had numbness in the left lower leg when walking    Examination:   BP (!) 148/80   Pulse 100   Ht 5\' 10"  (1.778 m)   Wt 241 lb 3.2 oz (109.4 kg)   SpO2 96%   BMI 34.61 kg/m   Body mass index is 34.61 kg/m.    ASSESSMENT/ PLAN:   Diabetes type 2  See history of present illness for detailed discussion of his current management, blood sugar patterns and problems identified  He is on a regimen of metformin, Victoza and basal insulin His A1c is still uncontrolled at 7.9 Although we did not see  his blood sugar readings on the monitor he is having consistently high readings after at least his evening meal and also fasting now Discussed that he does have some insulin deficiency with lack of adequate mealtime endogenous insulin He is also not able to do any better with his weight control and not motivated to exercise He can do better with his diet also  Recommendations:  Today discussed in detail the need for mealtime insulin to cover postprandial spikes, action of mealtime insulin, use of the insulin pen, timing and action of the rapid acting insulin as well as starting dose and  dosage titration to target the two-hour reading of under 180  He was started with a prescription for NovoLog and he can start with 5 units and increase by 1 unit at a time until he is at target  He will need to periodically adjust the dose based on his readings after his evening meal at least  He will need to also continue same regimen for [place] but consider increasing the dose of fasting readings are higher again  Should check his blood sugars after breakfast and lunch also  Discussed A1c targets   Review home blood sugars in 6 weeks   Hypertension: He will follow-up with his PCP , needs blood pressure to be at target  HYPERLIPIDEMIA: We will be checking the coverage for Praluent 75 mg every 2 weeks since Repatha is not covered With his LDL he needs to have more aggressive therapy Patient information brochure given on this and he is interested in trying this if it is covered by insurance Discussed how this works  Patient Instructions  Check blood sugars on waking up    Also check blood sugars about 2 hours after a meal and do this after different meals by rotation  Recommended blood sugar levels on waking up is 90-130 and about 2 hours after meal is 130-160  Please bring your blood sugar monitor to each visit, thank you      [facility] 02/04/2017, 8:15 AM

## 2017-02-04 NOTE — Patient Instructions (Addendum)
Check blood sugars on waking up  3x weekly  Also check blood sugars about 2 hours after a meal and do this after different meals by rotation  Recommended blood sugar levels on waking up is 90-130 and about 2 hours after meal is 130-160  Please bring your blood sugar monitor to each visit, thank you  

## 2017-03-17 ENCOUNTER — Other Ambulatory Visit (INDEPENDENT_AMBULATORY_CARE_PROVIDER_SITE_OTHER): Payer: Medicare HMO

## 2017-03-17 DIAGNOSIS — Z794 Long term (current) use of insulin: Secondary | ICD-10-CM | POA: Diagnosis not present

## 2017-03-17 DIAGNOSIS — E1165 Type 2 diabetes mellitus with hyperglycemia: Secondary | ICD-10-CM | POA: Diagnosis not present

## 2017-03-17 DIAGNOSIS — E782 Mixed hyperlipidemia: Secondary | ICD-10-CM

## 2017-03-17 LAB — COMPREHENSIVE METABOLIC PANEL
ALT: 18 U/L (ref 0–53)
AST: 13 U/L (ref 0–37)
Albumin: 3.6 g/dL (ref 3.5–5.2)
Alkaline Phosphatase: 93 U/L (ref 39–117)
BILIRUBIN TOTAL: 0.3 mg/dL (ref 0.2–1.2)
BUN: 16 mg/dL (ref 6–23)
CO2: 30 meq/L (ref 19–32)
Calcium: 9.6 mg/dL (ref 8.4–10.5)
Chloride: 101 mEq/L (ref 96–112)
Creatinine, Ser: 1.36 mg/dL (ref 0.40–1.50)
GFR: 65.04 mL/min (ref >60.00)
GLUCOSE: 158 mg/dL — AB (ref 70–99)
POTASSIUM: 4.3 meq/L (ref 3.5–5.1)
SODIUM: 139 meq/L (ref 135–145)
TOTAL PROTEIN: 7.1 g/dL (ref 6.0–8.3)

## 2017-03-17 LAB — LIPID PANEL
CHOL/HDL RATIO: 7
Cholesterol: 214 mg/dL — ABNORMAL HIGH (ref 0–200)
HDL: 29 mg/dL — ABNORMAL LOW (ref >39.00)
NONHDL: 185.35
TRIGLYCERIDES: 244 mg/dL — AB (ref 0.0–149.0)
VLDL: 48.8 mg/dL — ABNORMAL HIGH (ref 0.0–40.0)

## 2017-03-17 LAB — LDL CHOLESTEROL, DIRECT: Direct LDL: 151 mg/dL

## 2017-03-18 LAB — FRUCTOSAMINE: Fructosamine: 270 umol/L (ref 0–285)

## 2017-03-20 ENCOUNTER — Ambulatory Visit: Payer: Medicare HMO | Admitting: Endocrinology

## 2017-03-25 ENCOUNTER — Encounter: Payer: Self-pay | Admitting: Endocrinology

## 2017-03-25 ENCOUNTER — Telehealth: Payer: Self-pay | Admitting: Endocrinology

## 2017-03-25 NOTE — Telephone Encounter (Signed)
Pt has been contaced to reschedule with no success. Will call back

## 2017-05-22 ENCOUNTER — Ambulatory Visit (INDEPENDENT_AMBULATORY_CARE_PROVIDER_SITE_OTHER): Payer: Medicare HMO | Admitting: Endocrinology

## 2017-05-22 ENCOUNTER — Encounter: Payer: Self-pay | Admitting: Endocrinology

## 2017-05-22 VITALS — BP 142/64 | HR 101 | Temp 99.0°F | Wt 240.5 lb

## 2017-05-22 DIAGNOSIS — E1159 Type 2 diabetes mellitus with other circulatory complications: Secondary | ICD-10-CM | POA: Diagnosis not present

## 2017-05-22 DIAGNOSIS — I10 Essential (primary) hypertension: Secondary | ICD-10-CM | POA: Diagnosis not present

## 2017-05-22 DIAGNOSIS — E782 Mixed hyperlipidemia: Secondary | ICD-10-CM | POA: Diagnosis not present

## 2017-05-22 DIAGNOSIS — E1165 Type 2 diabetes mellitus with hyperglycemia: Secondary | ICD-10-CM

## 2017-05-22 DIAGNOSIS — Z794 Long term (current) use of insulin: Secondary | ICD-10-CM | POA: Diagnosis not present

## 2017-05-22 LAB — POCT GLYCOSYLATED HEMOGLOBIN (HGB A1C): Hemoglobin A1C: 7.4

## 2017-05-22 NOTE — Progress Notes (Signed)
Patient ID: Eric Long, male   DOB: [birth year], 77 y.o.   MRN: [id number]   Reason for Appointment:  follow-up   History of Present Illness   Diagnosis: Type 2 DIABETES MELITUS, date of diagnosis: 2006      Previous history: He was previously treated with metformin, Amaryl and Januvia but subsequently had poor control with A1c of 11.8 in 2011 and 11.6 in 7/13 In 8/13 is Januvia was stopped and he was started on Victoza and Lantus insulin With this his blood sugars were improved significantly and his Amaryl was stopped. He also started losing weight His A1c in 6/14 was excellent at 6.3  Recent history:    Insulin regimen: Tresiba 24 units at bedtime, Novolog 6acs         Non-insulin hypoglycemic drugs: Metformin 2 g daily, Victoza 1.8 mg daily        His A1c has been consistently around 8% and now slightly better at 7.4, previously 7.9  Current management, blood sugar patterns and problems identified:   He did bring his monitor for download  However he has only about 6 readings in the last month, more at bedtime  He cannot explain the high reading of 256 around 5 PM, he thinks he generally eats smaller meal in the midday  He was supposed to come back for follow-up in November but did not do so after starting NOVOLOG at suppertime  Today in the office he checked his blood sugar and was 190 fasting, and November lab fasting glucose was 158  Despite his trying to go to the exercise regimen at his gym about 3 days a week he has not lost any weight  He thinks he is compliant with metformin and Victoza        Meal times: Breakfast 9 AM, dinner-7-8 PM  Monitors blood glucose:  about once a day .    Glucometer: One [employer].          Blood Glucose readings from review of monitor:   PRE-MEAL Fasting Lunch Afternoon  Bedtime Overall  Glucose range: 190  168, 256  158-190    Mean/median:         Physical activity: exercise:  at gym with walking, Weight  training; about 2-3 days a week   Dietician visit:  07/2014, has seen CDE in 5/78         Complications: are: Peripheral vascular disease, neuropathy     Wt Readings from Last 3 Encounters:  05/22/17 240 lb 8 oz (109.1 kg)  02/04/17 241 lb 3.2 oz (109.4 kg)  10/21/16 237 lb (107.5 kg)     LABS:  Lab Results  Component Value Date   HGBA1C 7.4 05/22/2017   HGBA1C 7.9 (H) 01/29/2017   HGBA1C 7.9 (H) 10/11/2016   Lab Results  Component Value Date   MICROALBUR 1.7 01/29/2017   LDLCALC 168 (H) 12/12/2014   CREATININE 1.36 03/17/2017   Office Visit on 05/22/2017  Component Date Value Ref Range Status  . Hemoglobin A1C 05/22/2017 7.4   Final       Allergies as of 05/22/2017      Reactions   Aspirin    Diarrhea      Medication List        Accurate as of 05/22/17 12:27 PM. Always use your most recent med list.          amlodipine-benazepril 2.5-10 MG capsule Commonly known as:  LOTREL Take 1 capsule by mouth daily.  ezetimibe 10 MG tablet Commonly known as:  ZETIA Take 1 tablet (10 mg total) by mouth daily.   glucose blood test strip Commonly known as:  ONETOUCH VERIO Use to check blood sugar 2 times per day by rotation of meals. Dx code: E11.9   insulin aspart 100 UNIT/ML FlexPen Commonly known as:  NOVOLOG FLEXPEN 5-7 units at main meal   Insulin Pen Needle 32G X 4 MM Misc 2 each by Does not apply route 2 (two) times daily.   liraglutide 18 MG/3ML Sopn Commonly known as:  VICTOZA INJECT 0.3 MLS  (1.8 MG TOTAL) INTO THE SKIN ONCE A DAY   metFORMIN 500 MG tablet Commonly known as:  GLUCOPHAGE Take 2 tablets 2 times a day   metoprolol succinate 25 MG 24 hr tablet Commonly known as:  TOPROL-XL Take 25 mg by mouth daily.   rosuvastatin 40 MG tablet Commonly known as:  CRESTOR Take 40 mg by mouth daily.   TRESIBA FLEXTOUCH 100 UNIT/ML Sopn FlexTouch Pen Generic drug:  insulin degludec 25 Units at bedtime.       Allergies:  Allergies    Allergen Reactions  . Aspirin     Diarrhea    Past Medical History:  Diagnosis Date  . Diabetes mellitus without complication ([place])   . Hyperlipidemia     Past Surgical History:  Procedure Laterality Date  . FEMORAL-FEMORAL BYPASS GRAFT Bilateral 2007    Family History  Problem Relation Age of Onset  . Diabetes Sister     Social History:  reports that he has quit smoking. he has never used smokeless tobacco. His alcohol and drug histories are not on file.  Review of Systems:  Hypertension:  He is on Lotrel and metoprolol, also followed by PCP  Blood pressure is variably controlled   BP Readings from Last 3 Encounters:  05/22/17 (!) 142/64  02/04/17 (!) 148/80  10/15/16 (!) 152/68    His creatinine is upper normal usually and stable  Lab Results  Component Value Date   CREATININE 1.36 03/17/2017   CREATININE 1.41 01/29/2017   CREATININE 1.26 10/11/2016      Lipids: Has had mixed hyperlipidemia and also significantly high LDL He Has persistently high LDL value  also now triglycerides He was asked to check on his insurance coverage for Praluent but he did not do so Apparently he was told Repatha was not covered last year    Lab Results  Component Value Date   CHOL 214 (H) 03/17/2017   HDL 29.00 (L) 03/17/2017   LDLCALC 168 (H) 12/12/2014   LDLDIRECT 151.0 03/17/2017   TRIG 244.0 (H) 03/17/2017   CHOLHDL 7 03/17/2017      He has had numbness in the legs when walking for about a mile on the treadmill but has not discussed with PCP again Does not have numbness any other time Has  some stable claudication and he will stop when he needs to rest while walking   Examination:   BP (!) 142/64 (BP Location: Left Arm, Patient Position: Sitting, Cuff Size: Large)   Pulse (!) 101   Temp 99 F (37.2 C) (Oral)   Wt 240 lb 8 oz (109.1 kg)   SpO2 96%   BMI 34.51 kg/m   Body mass index is 34.51 kg/m.   Diabetic Foot Exam - Simple   No data filed       ASSESSMENT/ PLAN:   Diabetes type 2  See history of present illness for detailed discussion of his  current management, blood sugar patterns and problems identified  He is on a regimen of metformin, Victoza and  Insulin  His A1c is slightly better with adding NovoLog in October However has minimal home glucose monitoring Appears to have still higher fasting readings, was 190 today  He probably has some unbalanced meals but most likely is showing some progression of his diabetes now Difficult to lose weight for him  His A1c is still uncontrolled at 7.9 Although we did not see his blood sugar readings on the monitor he is having consistently high readings after at least his evening meal and also fasting now Discussed that he does have some insulin deficiency with lack of adequate mealtime endogenous insulin He is also not able to do any better with his weight control and not motivated to exercise He can do better with his diet also  Recommendations:  Discussed adjustment of basal insulin to get morning sugars down and mealtime insulin to control postprandial readings  INCREASE Tresiba by 4 units.  Given him a flowsheet and discussed in detail how to adjust Tresiba every 3 days by 2 units to get morning sugars below 130 consistently  He needs to check his blood sugar at least once daily and not sporadically  If he has a large carbohydrate intake at lunch time he will take NovoLog 6 units at bedtime also  For now continue 6 units Novolog at suppertime  Consultation with dietitian  Discussed need for balanced meals with some protein consistently  Will need short-term follow-up make sure he is achieving his targets    Hypertension: He will follow-up with his PCP regularly  HYPERLIPIDEMIA: We will be checking the coverage for Praluent 75 mg every 2 weeks since Repatha is not covered  Given him written name for him to call his insurance He will start when this is  approved  Neurogenic claudication: he will discuss with PCP   Patient Instructions  Tresiba 28 units and adjust   Check coverage for PRALUENT or Repatha    Counseling time on subjects discussed in assessment and plan sections is over 50% of today's 25 minute visit    Elayne Snare 05/22/2017, 12:27 PM

## 2017-05-22 NOTE — Patient Instructions (Addendum)
Tresiba 28 units and adjust   Check coverage for PRALUENT or Repatha

## 2017-06-19 ENCOUNTER — Other Ambulatory Visit (INDEPENDENT_AMBULATORY_CARE_PROVIDER_SITE_OTHER): Payer: Medicare HMO

## 2017-06-19 ENCOUNTER — Ambulatory Visit: Payer: Medicare Other | Admitting: Endocrinology

## 2017-06-19 DIAGNOSIS — E1159 Type 2 diabetes mellitus with other circulatory complications: Secondary | ICD-10-CM | POA: Diagnosis not present

## 2017-06-19 LAB — BASIC METABOLIC PANEL
BUN: 14 mg/dL (ref 6–23)
CO2: 31 mEq/L (ref 19–32)
CREATININE: 1.43 mg/dL (ref 0.40–1.50)
Calcium: 9.1 mg/dL (ref 8.4–10.5)
Chloride: 99 mEq/L (ref 96–112)
GFR: 61.34 mL/min (ref >60.00)
Glucose, Bld: 204 mg/dL — ABNORMAL HIGH (ref 70–99)
Potassium: 4 mEq/L (ref 3.5–5.1)
Sodium: 136 mEq/L (ref 135–145)

## 2017-06-20 LAB — FRUCTOSAMINE: FRUCTOSAMINE: 300 umol/L — AB (ref 0–285)

## 2017-06-24 ENCOUNTER — Ambulatory Visit (INDEPENDENT_AMBULATORY_CARE_PROVIDER_SITE_OTHER): Payer: Medicare HMO | Admitting: Endocrinology

## 2017-06-24 ENCOUNTER — Encounter: Payer: Self-pay | Admitting: Endocrinology

## 2017-06-24 VITALS — BP 138/72 | HR 94 | Ht 70.0 in | Wt 241.6 lb

## 2017-06-24 DIAGNOSIS — Z794 Long term (current) use of insulin: Secondary | ICD-10-CM | POA: Diagnosis not present

## 2017-06-24 DIAGNOSIS — E1165 Type 2 diabetes mellitus with hyperglycemia: Secondary | ICD-10-CM

## 2017-06-24 NOTE — Progress Notes (Signed)
Patient ID: Eric Long, male   DOB: 10/11/1940, 77 y.o.   MRN: [id number]   Reason for Appointment:  follow-up of diabetes  History of Present Illness   Diagnosis: Type 2 DIABETES MELITUS, date of diagnosis: 2006      Previous history: He was previously treated with metformin, Amaryl and Januvia but subsequently had poor control with A1c of 11.8 in 2011 and 11.6 in 7/13 In 8/13 is Januvia was stopped and he was started on Victoza and Lantus insulin With this his blood sugars were improved significantly and his Amaryl was stopped. He also started losing weight His A1c in 6/14 was excellent at 6.3  Recent history:    Insulin regimen: Tresiba 24 units at SUPPER, Novolog 6-7 acs         Non-insulin hypoglycemic drugs: Metformin 2 g daily, Victoza 1.8 mg daily IN AM       His A1c has been consistently around 8% and down to 7.4 in January Fructosamine is still relatively high at 300 now   Current management, blood sugar patterns and problems identified:   He was told to increase his [place] on the last visit since fasting readings were relatively higher but he did not do so  However couple of weeks ago he was appearing to have fairly good fasting readings except once  He thinks that his blood sugars go up overnight when he goes off his diet and has a large snack  When he had his lab work his blood sugar was 204 in the morning from eating the night before  He does have some carbohydrate at lunch everyday which may be a late breakfast also  Also readings after supper are variable based on his compliance with diet but he does not adjust his mealtime doses based on what he is eating  However he has tried to be more regular with going to the gym for exercise        Meal times: Breakfast usually 9 AM, dinner-7 PM  Monitors blood glucose:  about once a day .    Glucometer: One Probation officer.          Blood Glucose readings from review of monitor download:  Mean values  apply above for all meters except median for One Touch  PRE-MEAL Fasting Lunch Dinner Overnight  Overall  Glucose range: 121-171   ?  142  141-198    Mean/median: 139  138    145    POST-MEAL PC Breakfast PC Lunch PC Dinner  Glucose range:   131-224   Mean/median:        Physical activity: exercise:  at gym with walking, Weight training; about 4 days a week   Dietician visit:  6/18, has seen CDE in 8/54         Complications: are: Peripheral vascular disease, neuropathy     Wt Readings from Last 3 Encounters:  06/24/17 241 lb 9.6 oz (109.6 kg)  05/22/17 240 lb 8 oz (109.1 kg)  02/04/17 241 lb 3.2 oz (109.4 kg)     LABS:  Lab Results  Component Value Date   HGBA1C 7.4 05/22/2017   HGBA1C 7.9 (H) 01/29/2017   HGBA1C 7.9 (H) 10/11/2016   Lab Results  Component Value Date   MICROALBUR 1.7 01/29/2017   LDLCALC 168 (H) 12/12/2014   CREATININE 1.43 06/19/2017   Lab on 06/19/2017  Component Date Value Ref Range Status  . Fructosamine 06/19/2017 300* 0 - 285 umol/L Final  Comment: Published reference interval for apparently healthy subjects between age 107 and 34 is 43 - 285 umol/L and in a poorly controlled diabetic population is 228 - 563 umol/L with a mean of 396 umol/L.   Marland Kitchen Sodium 06/19/2017 136  135 - 145 mEq/L Final  . Potassium 06/19/2017 4.0  3.5 - 5.1 mEq/L Final  . Chloride 06/19/2017 99  96 - 112 mEq/L Final  . CO2 06/19/2017 31  19 - 32 mEq/L Final  . Glucose, Bld 06/19/2017 204* 70 - 99 mg/dL Final  . BUN 06/19/2017 14  6 - 23 mg/dL Final  . Creatinine, Ser 06/19/2017 1.43  0.40 - 1.50 mg/dL Final  . Calcium 06/19/2017 9.1  8.4 - 10.5 mg/dL Final  . GFR 06/19/2017 61.34  >60.00 mL/min Final       Allergies as of 06/24/2017      Reactions   Aspirin    Diarrhea      Medication List        Accurate as of 06/24/17 11:59 PM. Always use your most recent med list.          amlodipine-benazepril 2.5-10 MG capsule Commonly known as:  LOTREL Take  1 capsule by mouth daily.   ezetimibe 10 MG tablet Commonly known as:  ZETIA Take 1 tablet (10 mg total) by mouth daily.   glucose blood test strip Commonly known as:  ONETOUCH VERIO Use to check blood sugar 2 times per day by rotation of meals. Dx code: E11.9   insulin aspart 100 UNIT/ML FlexPen Commonly known as:  NOVOLOG FLEXPEN 5-7 units at main meal   Insulin Pen Needle 32G X 4 MM Misc 2 each by Does not apply route 2 (two) times daily.   liraglutide 18 MG/3ML Sopn Commonly known as:  VICTOZA INJECT 0.3 MLS  (1.8 MG TOTAL) INTO THE SKIN ONCE A DAY   metFORMIN 500 MG tablet Commonly known as:  GLUCOPHAGE Take 2 tablets 2 times a day   metoprolol succinate 25 MG 24 hr tablet Commonly known as:  TOPROL-XL Take 25 mg by mouth daily.   rosuvastatin 40 MG tablet Commonly known as:  CRESTOR Take 40 mg by mouth daily.   TRESIBA FLEXTOUCH 100 UNIT/ML Sopn FlexTouch Pen Generic drug:  insulin degludec 25 Units at bedtime.       Allergies:  Allergies  Allergen Reactions  . Aspirin     Diarrhea    Past Medical History:  Diagnosis Date  . Diabetes mellitus without complication ([place])   . Hyperlipidemia     Past Surgical History:  Procedure Laterality Date  . FEMORAL-FEMORAL BYPASS GRAFT Bilateral 2007    Family History  Problem Relation Age of Onset  . Diabetes Sister     Social History:  reports that he has quit smoking. he has never used smokeless tobacco. His alcohol and drug histories are not on file.  Review of Systems:  Hypertension:  He is on Lotrel and metoprolol, also followed by PCP  Blood pressure is variably controlled   BP Readings from Last 3 Encounters:  06/24/17 138/72  05/22/17 (!) 142/64  02/04/17 (!) 148/80    His creatinine is upper normal usually and stable  Lab Results  Component Value Date   CREATININE 1.43 06/19/2017   CREATININE 1.36 03/17/2017   CREATININE 1.41 01/29/2017      Lipids: Has had mixed  hyperlipidemia and also significantly high LDL He Has persistently high LDL value  also now triglycerides He was asked to  check on his insurance coverage for Praluent but he did not do so  Apparently he was told Repatha was not covered last year    Lab Results  Component Value Date   CHOL 214 (H) 03/17/2017   HDL 29.00 (L) 03/17/2017   LDLCALC 168 (H) 12/12/2014   LDLDIRECT 151.0 03/17/2017   TRIG 244.0 (H) 03/17/2017   CHOLHDL 7 03/17/2017      He has had numbness in the legs when walking for about a mile on the treadmill but has not discussed with PCP again Does not have numbness any other time Has  some stable claudication and he will stop when he needs to rest while walking   Examination:   BP 138/72 (BP Location: Left Arm, Patient Position: Sitting, Cuff Size: Large)   Pulse 94   Ht 5\' 10"  (1.778 m)   Wt 241 lb 9.6 oz (109.6 kg)   SpO2 97%   BMI 34.67 kg/m   Body mass index is 34.67 kg/m.    ASSESSMENT/ PLAN:   Diabetes type 2  See history of present illness for detailed discussion of his current management, blood sugar patterns and problems identified  Although his last A1c was 7.4 and fructosamine is 300 he does appear to have postprandial hyperglycemia Also checking blood sugars erratically including fasting  He is only taking small amount of coverage for evening meal and may need to adjust this based on what he is eating For now will not change his [place] Aas Discussed that he should check his blood sugar after his first meal and most likely he will need 5-6 units NovoLog to cover this meal also if blood sugars are higher than 180 after eating He needs to cut back on bedtime snacks   HYPERLIPIDEMIA: This is poorly controlled and he needs to be on additional treatment, this was discussed  Again reminded him about checking the coverage for Praluent 75 mg every 2 weeks and also Repatha  He will start when this is approved    Patient Instructions  Check  blood sugars on waking up  2-3/7  Also check blood sugars about 2 hours after a meal and do this after different meals by rotation  Recommended blood sugar levels on waking up is 90-130 and about 2 hours after meal is 130-160  Please bring your blood sugar monitor to each visit, thank you  IF SUGAR AFTER BRUNCH IS over 180 then Novolog 6 units at that meal also  CHECK ON repatha and Praluent     Elayne Snare 06/25/2017, 10:57 AM

## 2017-06-24 NOTE — Patient Instructions (Addendum)
Check blood sugars on waking up  2-3/7  Also check blood sugars about 2 hours after a meal and do this after different meals by rotation  Recommended blood sugar levels on waking up is 90-130 and about 2 hours after meal is 130-160  Please bring your blood sugar monitor to each visit, thank you  IF SUGAR AFTER BRUNCH IS over 180 then Novolog 6 units at that meal also  CHECK ON repatha and Praluent

## 2017-06-27 ENCOUNTER — Other Ambulatory Visit: Payer: Self-pay | Admitting: Endocrinology

## 2017-07-01 ENCOUNTER — Other Ambulatory Visit: Payer: Self-pay | Admitting: Endocrinology

## 2017-07-21 ENCOUNTER — Encounter (HOSPITAL_COMMUNITY): Payer: Self-pay | Admitting: *Deleted

## 2017-07-21 ENCOUNTER — Inpatient Hospital Stay (HOSPITAL_COMMUNITY)
Admission: EM | Admit: 2017-07-21 | Discharge: 2017-07-26 | DRG: 065 | Disposition: A | Discharge status: Rehabilitation Facility | Payer: Medicare HMO | Attending: Family Medicine | Admitting: Family Medicine

## 2017-07-21 ENCOUNTER — Emergency Department (HOSPITAL_COMMUNITY): Payer: Medicare HMO

## 2017-07-21 ENCOUNTER — Other Ambulatory Visit: Payer: Self-pay

## 2017-07-21 ENCOUNTER — Ambulatory Visit
Admission: RE | Admit: 2017-07-21 | Discharge: 2017-07-21 | Disposition: A | Discharge status: Home / Self Care | Payer: Medicare HMO | Source: Ambulatory Visit | Attending: Family Medicine | Admitting: Family Medicine

## 2017-07-21 ENCOUNTER — Other Ambulatory Visit: Payer: Self-pay | Admitting: Family Medicine

## 2017-07-21 DIAGNOSIS — I10 Essential (primary) hypertension: Secondary | ICD-10-CM | POA: Diagnosis not present

## 2017-07-21 DIAGNOSIS — Z6833 Body mass index (BMI) 33.0-33.9, adult: Secondary | ICD-10-CM | POA: Diagnosis not present

## 2017-07-21 DIAGNOSIS — R410 Disorientation, unspecified: Secondary | ICD-10-CM

## 2017-07-21 DIAGNOSIS — E1122 Type 2 diabetes mellitus with diabetic chronic kidney disease: Secondary | ICD-10-CM | POA: Diagnosis not present

## 2017-07-21 DIAGNOSIS — I7389 Other specified peripheral vascular diseases: Secondary | ICD-10-CM | POA: Diagnosis not present

## 2017-07-21 DIAGNOSIS — I63511 Cerebral infarction due to unspecified occlusion or stenosis of right middle cerebral artery: Principal | ICD-10-CM | POA: Diagnosis present

## 2017-07-21 DIAGNOSIS — Z794 Long term (current) use of insulin: Secondary | ICD-10-CM | POA: Diagnosis not present

## 2017-07-21 DIAGNOSIS — Z87891 Personal history of nicotine dependence: Secondary | ICD-10-CM

## 2017-07-21 DIAGNOSIS — H5340 Unspecified visual field defects: Secondary | ICD-10-CM | POA: Diagnosis present

## 2017-07-21 DIAGNOSIS — Y92230 Patient room in hospital as the place of occurrence of the external cause: Secondary | ICD-10-CM | POA: Diagnosis not present

## 2017-07-21 DIAGNOSIS — R479 Unspecified speech disturbances: Secondary | ICD-10-CM | POA: Diagnosis not present

## 2017-07-21 DIAGNOSIS — R8290 Unspecified abnormal findings in urine: Secondary | ICD-10-CM | POA: Diagnosis not present

## 2017-07-21 DIAGNOSIS — I672 Cerebral atherosclerosis: Secondary | ICD-10-CM | POA: Diagnosis not present

## 2017-07-21 DIAGNOSIS — G9349 Other encephalopathy: Secondary | ICD-10-CM | POA: Diagnosis not present

## 2017-07-21 DIAGNOSIS — E1121 Type 2 diabetes mellitus with diabetic nephropathy: Secondary | ICD-10-CM | POA: Diagnosis not present

## 2017-07-21 DIAGNOSIS — Z8673 Personal history of transient ischemic attack (TIA), and cerebral infarction without residual deficits: Secondary | ICD-10-CM | POA: Diagnosis not present

## 2017-07-21 DIAGNOSIS — R2681 Unsteadiness on feet: Secondary | ICD-10-CM | POA: Diagnosis present

## 2017-07-21 DIAGNOSIS — N183 Chronic kidney disease, stage 3 unspecified: Secondary | ICD-10-CM | POA: Diagnosis present

## 2017-07-21 DIAGNOSIS — G8114 Spastic hemiplegia affecting left nondominant side: Secondary | ICD-10-CM | POA: Diagnosis not present

## 2017-07-21 DIAGNOSIS — R4020 Unspecified coma: Secondary | ICD-10-CM | POA: Diagnosis not present

## 2017-07-21 DIAGNOSIS — E669 Obesity, unspecified: Secondary | ICD-10-CM | POA: Diagnosis present

## 2017-07-21 DIAGNOSIS — I519 Heart disease, unspecified: Secondary | ICD-10-CM | POA: Diagnosis not present

## 2017-07-21 DIAGNOSIS — Z886 Allergy status to analgesic agent status: Secondary | ICD-10-CM

## 2017-07-21 DIAGNOSIS — R000 Tachycardia, unspecified: Secondary | ICD-10-CM | POA: Diagnosis present

## 2017-07-21 DIAGNOSIS — I4510 Unspecified right bundle-branch block: Secondary | ICD-10-CM | POA: Diagnosis present

## 2017-07-21 DIAGNOSIS — E1129 Type 2 diabetes mellitus with other diabetic kidney complication: Secondary | ICD-10-CM | POA: Diagnosis present

## 2017-07-21 DIAGNOSIS — Z833 Family history of diabetes mellitus: Secondary | ICD-10-CM | POA: Diagnosis not present

## 2017-07-21 DIAGNOSIS — E785 Hyperlipidemia, unspecified: Secondary | ICD-10-CM | POA: Diagnosis not present

## 2017-07-21 DIAGNOSIS — I639 Cerebral infarction, unspecified: Secondary | ICD-10-CM | POA: Diagnosis present

## 2017-07-21 DIAGNOSIS — G9340 Encephalopathy, unspecified: Secondary | ICD-10-CM

## 2017-07-21 DIAGNOSIS — R233 Spontaneous ecchymoses: Secondary | ICD-10-CM | POA: Diagnosis present

## 2017-07-21 DIAGNOSIS — D62 Acute posthemorrhagic anemia: Secondary | ICD-10-CM

## 2017-07-21 DIAGNOSIS — R278 Other lack of coordination: Secondary | ICD-10-CM | POA: Diagnosis not present

## 2017-07-21 DIAGNOSIS — W1830XA Fall on same level, unspecified, initial encounter: Secondary | ICD-10-CM | POA: Diagnosis not present

## 2017-07-21 DIAGNOSIS — I452 Bifascicular block: Secondary | ICD-10-CM | POA: Diagnosis present

## 2017-07-21 DIAGNOSIS — I5189 Other ill-defined heart diseases: Secondary | ICD-10-CM

## 2017-07-21 DIAGNOSIS — R5383 Other fatigue: Secondary | ICD-10-CM | POA: Diagnosis not present

## 2017-07-21 DIAGNOSIS — G8194 Hemiplegia, unspecified affecting left nondominant side: Secondary | ICD-10-CM | POA: Diagnosis present

## 2017-07-21 DIAGNOSIS — I6523 Occlusion and stenosis of bilateral carotid arteries: Secondary | ICD-10-CM | POA: Diagnosis present

## 2017-07-21 DIAGNOSIS — E7800 Pure hypercholesterolemia, unspecified: Secondary | ICD-10-CM | POA: Diagnosis present

## 2017-07-21 DIAGNOSIS — R29702 NIHSS score 2: Secondary | ICD-10-CM | POA: Diagnosis present

## 2017-07-21 DIAGNOSIS — I129 Hypertensive chronic kidney disease with stage 1 through stage 4 chronic kidney disease, or unspecified chronic kidney disease: Secondary | ICD-10-CM | POA: Diagnosis present

## 2017-07-21 DIAGNOSIS — Z79899 Other long term (current) drug therapy: Secondary | ICD-10-CM | POA: Diagnosis not present

## 2017-07-21 DIAGNOSIS — S0990XA Unspecified injury of head, initial encounter: Secondary | ICD-10-CM | POA: Diagnosis not present

## 2017-07-21 DIAGNOSIS — R29705 NIHSS score 5: Secondary | ICD-10-CM | POA: Diagnosis not present

## 2017-07-21 HISTORY — DX: Cerebral infarction, unspecified: I63.9

## 2017-07-21 LAB — DIFFERENTIAL
BASOS ABS: 0 10*3/uL (ref 0.0–0.1)
Basophils Relative: 0 %
EOS ABS: 0.2 10*3/uL (ref 0.0–0.7)
Eosinophils Relative: 2 %
LYMPHS ABS: 2.5 10*3/uL (ref 0.7–4.0)
Lymphocytes Relative: 33 %
MONOS PCT: 6 %
Monocytes Absolute: 0.4 10*3/uL (ref 0.1–1.0)
Neutro Abs: 4.6 10*3/uL (ref 1.7–7.7)
Neutrophils Relative %: 59 %

## 2017-07-21 LAB — CBC
HEMATOCRIT: 38.6 % — AB (ref 39.0–52.0)
HEMOGLOBIN: 12.7 g/dL — AB (ref 13.0–17.0)
MCH: 30.6 pg (ref 26.0–34.0)
MCHC: 32.9 g/dL (ref 30.0–36.0)
MCV: 93 fL (ref 78.0–100.0)
Platelets: 246 10*3/uL (ref 150–400)
RBC: 4.15 MIL/uL — ABNORMAL LOW (ref 4.22–5.81)
RDW: 14.3 % (ref 11.5–15.5)
WBC: 7.7 10*3/uL (ref 4.0–10.5)

## 2017-07-21 LAB — COMPREHENSIVE METABOLIC PANEL
ALK PHOS: 102 U/L (ref 38–126)
ALT: 14 U/L — ABNORMAL LOW (ref 17–63)
AST: 18 U/L (ref 15–41)
Albumin: 3.6 g/dL (ref 3.5–5.0)
Anion gap: 10 (ref 5–15)
BILIRUBIN TOTAL: 0.4 mg/dL (ref 0.3–1.2)
BUN: 13 mg/dL (ref 6–20)
CALCIUM: 9.2 mg/dL (ref 8.9–10.3)
CO2: 26 mmol/L (ref 22–32)
CREATININE: 1.41 mg/dL — AB (ref 0.61–1.24)
Chloride: 101 mmol/L (ref 101–111)
GFR calc Af Amer: 53 mL/min — ABNORMAL LOW (ref >60)
GFR, EST NON AFRICAN AMERICAN: 46 mL/min — AB (ref >60)
Glucose, Bld: 128 mg/dL — ABNORMAL HIGH (ref 65–99)
POTASSIUM: 4.1 mmol/L (ref 3.5–5.1)
Sodium: 137 mmol/L (ref 135–145)
TOTAL PROTEIN: 8 g/dL (ref 6.5–8.1)

## 2017-07-21 LAB — APTT: APTT: 31 s (ref 24–36)

## 2017-07-21 LAB — I-STAT CHEM 8, ED
BUN: 17 mg/dL (ref 6–20)
CALCIUM ION: 1.11 mmol/L — AB (ref 1.15–1.40)
CREATININE: 1.4 mg/dL — AB (ref 0.61–1.24)
Chloride: 103 mmol/L (ref 101–111)
GLUCOSE: 125 mg/dL — AB (ref 65–99)
HEMATOCRIT: 38 % — AB (ref 39.0–52.0)
HEMOGLOBIN: 12.9 g/dL — AB (ref 13.0–17.0)
Potassium: 4.1 mmol/L (ref 3.5–5.1)
Sodium: 139 mmol/L (ref 135–145)
TCO2: 25 mmol/L (ref 22–32)

## 2017-07-21 LAB — CBG MONITORING, ED: Glucose-Capillary: 124 mg/dL — ABNORMAL HIGH (ref 65–99)

## 2017-07-21 LAB — PROTIME-INR
INR: 0.98
Prothrombin Time: 12.9 seconds (ref 11.4–15.2)

## 2017-07-21 LAB — I-STAT TROPONIN, ED: TROPONIN I, POC: 0 ng/mL (ref 0.00–0.08)

## 2017-07-21 MED ORDER — ASPIRIN EC 81 MG PO TBEC
81.0000 mg | DELAYED_RELEASE_TABLET | Freq: Once | ORAL | Status: AC
  Administered 2017-07-21: 81 mg via ORAL
  Filled 2017-07-21: qty 1

## 2017-07-21 NOTE — ED Notes (Addendum)
EDP at bedside. Eric Long

## 2017-07-21 NOTE — ED Notes (Addendum)
Patient transported to MRI 

## 2017-07-21 NOTE — ED Triage Notes (Signed)
Pt. Last normal or symptoms noticed on Sat. No weakness at nurse 1st.

## 2017-07-21 NOTE — ED Provider Notes (Signed)
[facility] EMERGENCY DEPARTMENT Provider Note   CSN: [id number] Arrival date & time: 07/21/17  1631     History   Chief Complaint Chief Complaint  Patient presents with  . Cerebrovascular Accident    HPI Eric Long is a 77 y.o. male.  The history is provided by the patient and medical records.    77 y.o. M with hx of HLP, DM, CKD, HLP, presenting to the ED for further work-up of possible stroke.  Last normal was Friday evening-- daughter talked to him on the phone but did not seem him and he seemed fine.  Saturday she went to visit him and noticed that he seemed "off"-- she reports his speech and responses to questioning was much slower than normal, but his responses were appropriate.  There was no apparent aphasia or slurring of the words.  States she also noticed that his gait seemed to be very unsteady and he did report falling a few times Saturday morning prior to their arrival.  They went to the doctor earlier today and had a STAT outpatient CT performed that was concerning for possible acute stroke and they were referred here for further workup.  There has not been any improvement in his symptoms since onset.  No new symptoms have developed either.  No hx of TIA or CVA in the past.  Not currently on anticoagulation.  Patient is extremely functional at baseline-- walks without assistance, uses computer, writes, drives, cooks, Social research officer, government.  Past Medical History:  Diagnosis Date  . Diabetes mellitus without complication ([place])   . Hyperlipidemia     Patient Active Problem List   Diagnosis Date Noted  . Chronic kidney disease, stage III (moderate) ([place]) 02/19/2013  . Atherosclerotic peripheral vascular disease ([place]) 02/19/2013  . Pure hypercholesterolemia 02/19/2013  . Diabetes mellitus, stable ([place]) 02/05/2013    Past Surgical History:  Procedure Laterality Date  . FEMORAL-FEMORAL BYPASS GRAFT Bilateral 2007       Home Medications    Prior to Admission  medications   Medication Sig Start Date End Date Taking? Authorizing Provider  amlodipine-benazepril (LOTREL) 2.5-10 MG per capsule Take 1 capsule by mouth daily.  12/08/13  Yes [provider]  ezetimibe (ZETIA) 10 MG tablet Take 1 tablet (10 mg total) by mouth daily. 03/27/16  Yes Elayne Snare, MD  insulin aspart (NOVOLOG FLEXPEN) 100 UNIT/ML FlexPen 5-7 units at main meal 02/04/17  Yes Elayne Snare, MD  liraglutide (VICTOZA) 18 MG/3ML SOPN INJECT 0.3 MLS  (1.8 MG TOTAL) INTO THE SKIN ONCE A DAY 11/19/16  Yes Elayne Snare, MD  metoprolol succinate (TOPROL-XL) 25 MG 24 hr tablet Take 25 mg by mouth daily.  12/08/13  Yes [provider]  rosuvastatin (CRESTOR) 40 MG tablet Take 40 mg by mouth daily.  11/22/14  Yes [provider]  TRESIBA FLEXTOUCH 100 UNIT/ML SOPN FlexTouch Pen INJECT 25 UNITS SUBCUTANEOUSLY ONCE DAILY 07/01/17  Yes Elayne Snare, MD  glucose blood (ONETOUCH VERIO) test strip Use to check blood sugar 2 times per day by rotation of meals. Dx code: E11.9 Patient not taking: Reported on 07/21/2017 01/25/16   Elayne Snare, MD  metFORMIN (GLUCOPHAGE) 500 MG tablet Take 2 tablets 2 times a day 09/12/14   Elayne Snare, MD  RELION PEN NEEDLES 32G X 4 MM MISC USE ONE PEN NEEDLE TWICE DAILY Patient not taking: Reported on 07/21/2017 06/30/17   Elayne Snare, MD  TRESIBA FLEXTOUCH 100 UNIT/ML SOPN 25 Units at bedtime.  09/07/15  [provider]    Family History Family History  Problem Relation Age of Onset  . Diabetes Sister     Social History Social History   Tobacco Use  . Smoking status: Former Research scientist (life sciences)  . Smokeless tobacco: Never Used  Substance Use Topics  . Alcohol use: No    Frequency: Never  . Drug use: No     Allergies   Aspirin   Review of Systems Review of Systems  Musculoskeletal: Positive for gait problem.  Neurological: Positive for speech difficulty.  All other systems reviewed and are negative.    Physical Exam Updated Vital Signs BP  (!) 158/65   Pulse 95   Temp 98.1 F (36.7 C) (Oral)   Resp (!) 23   Ht 5' 10.5" (1.791 m)   Wt 108.9 kg (240 lb)   SpO2 94%   BMI 33.95 kg/m   Physical Exam  Constitutional: He is oriented to person, place, and time. He appears well-developed and well-nourished.  Very pleasant  HENT:  Head: Normocephalic and atraumatic.  Mouth/Throat: Oropharynx is clear and moist.  Eyes: Conjunctivae and EOM are normal. Pupils are equal, round, and reactive to light.  Neck: Normal range of motion.  Cardiovascular: Normal rate, regular rhythm and normal heart sounds.  Pulmonary/Chest: Effort normal and breath sounds normal.  Abdominal: Soft. Bowel sounds are normal.  Musculoskeletal: Normal range of motion.  Neurological: He is alert and oriented to person, place, and time.  AAOx3, answering questions and following commands appropriately but speech is intermittently very slow, no slurring of words; equal strength UE and LE bilaterally; some ataxia noted with the left arm compared with right, does have some dysmetria and hesitancy when following commands; normal movement of the right arm and legs; no pronator drift; some decreased sensation of lower legs and feet due to neuropathy (chronic); no facial droop, symmetric smile  Skin: Skin is warm and dry.  Psychiatric: He has a normal mood and affect.  Nursing note and vitals reviewed.    ED Treatments / Results  Labs (all labs ordered are listed, but only abnormal results are displayed) Labs Reviewed  CBC - Abnormal; Notable for the following components:      Result Value   RBC 4.15 (*)    Hemoglobin 12.7 (*)    HCT 38.6 (*)    All other components within normal limits  COMPREHENSIVE METABOLIC PANEL - Abnormal; Notable for the following components:   Glucose, Bld 128 (*)    Creatinine, Ser 1.41 (*)    ALT 14 (*)    GFR calc non Af Amer 46 (*)    GFR calc Af Amer 53 (*)    All other components within normal limits  CBG MONITORING, ED -  Abnormal; Notable for the following components:   Glucose-Capillary 124 (*)    All other components within normal limits  I-STAT CHEM 8, ED - Abnormal; Notable for the following components:   Creatinine, Ser 1.40 (*)    Glucose, Bld 125 (*)    Calcium, Ion 1.11 (*)    Hemoglobin 12.9 (*)    HCT 38.0 (*)    All other components within normal limits  PROTIME-INR  APTT  DIFFERENTIAL  I-STAT TROPONIN, ED  CBG MONITORING, ED    EKG  EKG Interpretation None       Radiology Ct Head Wo Contrast  Result Date: 07/21/2017 CLINICAL DATA:  Disorientation and unsteady gait for 2 days EXAM: CT HEAD WITHOUT CONTRAST TECHNIQUE: Contiguous axial  images were obtained from the base of the skull through the vertex without intravenous contrast. COMPARISON:  None. FINDINGS: Brain: Hypodensity within the right parietal and superior temporal lobes consistent with acute to subacute infarct. No definitive hemorrhage is seen. Encephalomalacia within the right posterior frontal lobe consistent with prior infarct. Age indeterminate hypodensity within the right frontal parietal white matter. Mild small vessel ischemic changes of the white matter. Probable old lacunar infarcts in the bilateral caudate. Mild atrophy. Nonenlarged ventricles. No midline shift. Fourth ventricle is patent. Basilar cisterns are patent. Vascular: No hyperdense vessels.  Carotid vascular calcification. Skull: No fracture or suspicious lesion. Sinuses/Orbits: No acute finding. Other: None IMPRESSION: 1. Hypodensity within the right parietal and temporal lobes consistent with acute to subacute infarct. No hemorrhage or significant midline shift at this time. MRI recommended for further evaluation 2. Mild small vessel ischemic changes of the white matter with atrophy. Old small infarct in the right frontal lobe. Critical Value/emergent results were called by telephone at the time of interpretation on 07/21/2017 at 4:09 pm to [facility] BRAKE , who  verbally acknowledged these results. Electronically Signed   By: Donavan Foil M.D.   On: 07/21/2017 16:09   Mr Brain Wo Contrast  Result Date: 07/21/2017 CLINICAL DATA:  CTA.  Loss of balance.  Fall. EXAM: MRI HEAD WITHOUT CONTRAST TECHNIQUE: Multiplanar, multiecho pulse sequences of the brain and surrounding structures were obtained without intravenous contrast. COMPARISON:  Head CT 07/21/2017 FINDINGS: Brain: The midline structures are normal. Large area of abnormal diffusion restriction within the posterior right MCA territory. There is a small area of diffusion restriction within the right PCA territory. Multiple old cerebellar infarcts and basal ganglia lacunar infarcts. No mass lesion, hydrocephalus, dural abnormality or extra-axial collection. Early confluent hyperintense T2-weighted signal of the periventricular and deep white matter, most commonly due to chronic ischemic microangiopathy. There is edema within the posterior right hemisphere in the distribution of diffusion abnormality. No age-advanced or lobar predominant atrophy. There is petechial hemorrhage within the posterior right MCA territory, but no space-occupying hematoma. Vascular: Major intracranial arterial and venous sinus flow voids are preserved. Skull and upper cervical spine: The visualized skull base, calvarium, upper cervical spine and extracranial soft tissues are normal. Sinuses/Orbits: No fluid levels or advanced mucosal thickening. No mastoid or middle ear effusion. Normal orbits. IMPRESSION: 1. Large area of acute ischemia within the posterior right hemisphere, involving the posterior right MCA territory and a small portion of the right PCA territory. Involvement of 2 vascular territories may indicate an embolic source. 2. Petechial hemorrhage in the posterior right MCA distribution, but no space-occupying hematoma. No midline shift or other mass effect. 3. Old basal ganglia and cerebellar lacunar infarcts superimposed on  chronic microvascular ischemia. Electronically Signed   By: Ulyses Jarred M.D.   On: 07/21/2017 22:59    Procedures Procedures (including critical care time)  Medications Ordered in ED Medications - No data to display   Initial Impression / Assessment and Plan / ED Course  I have reviewed the triage vital signs and the nursing notes.  Pertinent labs & imaging results that were available during my care of the patient were reviewed by me and considered in my medical decision making (see chart for details).  77 y.o. M here with what appears to be acute, 2 day old stroke.  He is out of the window today for any acute intervention, therefore code stroke not activated from triage.  On exam, he does seem to have some  speech delay, but thoughts are appropriate and goal oriented.  Some dysmetric and ataxia with the left arm on exam as well as some hesitancy when following commands.  Screening labs reassuring.  Patient sent for MRI.  MRI with large right sided stoke involving MCA and portions of PCA territory.  Also has noted petechia hemorrhage in right MCA without hematoma or mid-line shift.  Family has been updated with results and necessity for admission.  They are in agreement with treatment plan.  11:18 PM Spoke with neurology, Dr. Leonel Ramsay-- stable for floor.  Safe to give ASA with petechial hemorrhage noted on MRI.  He will consult.  Patient will be admitted to medicine service for ongoing care-- Dr. Hal Hope.  Final Clinical Impressions(s) / ED Diagnoses   Final diagnoses:  Acute CVA (cerebrovascular accident) [facility])    ED Discharge Orders    None       Larene Pickett, PA-C 07/22/17 0113    Daleen Bo, MD 07/23/17 [phone]

## 2017-07-21 NOTE — ED Notes (Signed)
Patient returned from MRI. No distress observed.

## 2017-07-21 NOTE — ED Triage Notes (Signed)
Pt was told by his MD to come to ED after CT results showed positive cva.  Pt noticed loss of balance of Sat, fell a few times, and went to Dr's office today.

## 2017-07-22 ENCOUNTER — Inpatient Hospital Stay (HOSPITAL_COMMUNITY): Payer: Medicare HMO

## 2017-07-22 ENCOUNTER — Encounter (HOSPITAL_COMMUNITY): Payer: Self-pay | Admitting: Internal Medicine

## 2017-07-22 ENCOUNTER — Other Ambulatory Visit: Payer: Self-pay

## 2017-07-22 DIAGNOSIS — E1121 Type 2 diabetes mellitus with diabetic nephropathy: Secondary | ICD-10-CM

## 2017-07-22 DIAGNOSIS — I10 Essential (primary) hypertension: Secondary | ICD-10-CM | POA: Diagnosis present

## 2017-07-22 DIAGNOSIS — G9340 Encephalopathy, unspecified: Secondary | ICD-10-CM

## 2017-07-22 DIAGNOSIS — E1129 Type 2 diabetes mellitus with other diabetic kidney complication: Secondary | ICD-10-CM | POA: Diagnosis present

## 2017-07-22 LAB — GLUCOSE, CAPILLARY
Glucose-Capillary: 103 mg/dL — ABNORMAL HIGH (ref 65–99)
Glucose-Capillary: 100 mg/dL — ABNORMAL HIGH (ref 65–99)
Glucose-Capillary: 120 mg/dL — ABNORMAL HIGH (ref 65–99)
Glucose-Capillary: 124 mg/dL — ABNORMAL HIGH (ref 65–99)
Glucose-Capillary: 93 mg/dL (ref 65–99)

## 2017-07-22 LAB — COMPREHENSIVE METABOLIC PANEL
ALT: 15 U/L — ABNORMAL LOW (ref 17–63)
AST: 19 U/L (ref 15–41)
Albumin: 3.2 g/dL — ABNORMAL LOW (ref 3.5–5.0)
Alkaline Phosphatase: 93 U/L (ref 38–126)
Anion gap: 9 (ref 5–15)
BUN: 15 mg/dL (ref 6–20)
CHLORIDE: 102 mmol/L (ref 101–111)
CO2: 26 mmol/L (ref 22–32)
Calcium: 9.1 mg/dL (ref 8.9–10.3)
Creatinine, Ser: 1.3 mg/dL — ABNORMAL HIGH (ref 0.61–1.24)
GFR, EST AFRICAN AMERICAN: 59 mL/min — AB (ref >60)
GFR, EST NON AFRICAN AMERICAN: 51 mL/min — AB (ref >60)
Glucose, Bld: 114 mg/dL — ABNORMAL HIGH (ref 65–99)
POTASSIUM: 3.7 mmol/L (ref 3.5–5.1)
Sodium: 137 mmol/L (ref 135–145)
Total Bilirubin: 0.6 mg/dL (ref 0.3–1.2)
Total Protein: 7.2 g/dL (ref 6.5–8.1)

## 2017-07-22 LAB — CBC
HCT: 37.5 % — ABNORMAL LOW (ref 39.0–52.0)
Hemoglobin: 12.2 g/dL — ABNORMAL LOW (ref 13.0–17.0)
MCH: 30.2 pg (ref 26.0–34.0)
MCHC: 32.5 g/dL (ref 30.0–36.0)
MCV: 92.8 fL (ref 78.0–100.0)
PLATELETS: 227 10*3/uL (ref 150–400)
RBC: 4.04 MIL/uL — ABNORMAL LOW (ref 4.22–5.81)
RDW: 14.2 % (ref 11.5–15.5)
WBC: 7 10*3/uL (ref 4.0–10.5)

## 2017-07-22 LAB — ECHOCARDIOGRAM COMPLETE
Height: 70.5 in
WEIGHTICAEL: 3823.66 [oz_av]

## 2017-07-22 LAB — LIPID PANEL
Cholesterol: 274 mg/dL — ABNORMAL HIGH (ref 0–200)
HDL: 30 mg/dL — ABNORMAL LOW (ref >40)
LDL CALC: 200 mg/dL — AB (ref 0–99)
TRIGLYCERIDES: 218 mg/dL — AB (ref <150)
Total CHOL/HDL Ratio: 9.1 RATIO
VLDL: 44 mg/dL — ABNORMAL HIGH (ref 0–40)

## 2017-07-22 LAB — CREATININE, SERUM
Creatinine, Ser: 1.34 mg/dL — ABNORMAL HIGH (ref 0.61–1.24)
GFR, EST AFRICAN AMERICAN: 56 mL/min — AB (ref >60)
GFR, EST NON AFRICAN AMERICAN: 49 mL/min — AB (ref >60)

## 2017-07-22 LAB — HEMOGLOBIN A1C
HEMOGLOBIN A1C: 7.6 % — AB (ref 4.8–5.6)
Mean Plasma Glucose: 171.42 mg/dL

## 2017-07-22 LAB — BLOOD GAS, ARTERIAL
Acid-Base Excess: 2.7 mmol/L — ABNORMAL HIGH (ref 0.0–2.0)
Bicarbonate: 27.3 mmol/L (ref 20.0–28.0)
Drawn by: 406621
FIO2: 0.21
O2 Saturation: 90.1 %
PCO2 ART: 46.6 mmHg (ref 32.0–48.0)
PH ART: 7.386 (ref 7.350–7.450)
PO2 ART: 61.6 mmHg — AB (ref 83.0–108.0)
Patient temperature: 98.6

## 2017-07-22 MED ORDER — ENOXAPARIN SODIUM 40 MG/0.4ML ~~LOC~~ SOLN
40.0000 mg | Freq: Every day | SUBCUTANEOUS | Status: DC
  Administered 2017-07-22 – 2017-07-26 (×5): 40 mg via SUBCUTANEOUS
  Filled 2017-07-22 (×4): qty 0.4

## 2017-07-22 MED ORDER — ROSUVASTATIN CALCIUM 40 MG PO TABS
40.0000 mg | ORAL_TABLET | Freq: Every day | ORAL | Status: DC
  Administered 2017-07-23 – 2017-07-26 (×4): 40 mg via ORAL
  Filled 2017-07-22 (×2): qty 2
  Filled 2017-07-22: qty 1
  Filled 2017-07-22: qty 2
  Filled 2017-07-22 (×3): qty 1
  Filled 2017-07-22: qty 2
  Filled 2017-07-22: qty 1

## 2017-07-22 MED ORDER — ASPIRIN EC 325 MG PO TBEC
325.0000 mg | DELAYED_RELEASE_TABLET | Freq: Every day | ORAL | Status: DC
  Administered 2017-07-23 – 2017-07-26 (×4): 325 mg via ORAL
  Filled 2017-07-22 (×4): qty 1

## 2017-07-22 MED ORDER — STROKE: EARLY STAGES OF RECOVERY BOOK
Freq: Once | Status: AC
  Administered 2017-07-22: 02:00:00
  Filled 2017-07-22: qty 1

## 2017-07-22 MED ORDER — ACETAMINOPHEN 160 MG/5ML PO SOLN: 650.0000 mg | ORAL | Status: DC | PRN

## 2017-07-22 MED ORDER — IOPAMIDOL (ISOVUE-370) INJECTION 76%
INTRAVENOUS | Status: AC
  Administered 2017-07-22: 50 mL
  Filled 2017-07-22: qty 50

## 2017-07-22 MED ORDER — SODIUM CHLORIDE 0.9 % IV SOLN
INTRAVENOUS | Status: DC
  Administered 2017-07-22: 02:00:00 via INTRAVENOUS

## 2017-07-22 MED ORDER — INSULIN GLARGINE 100 UNIT/ML ~~LOC~~ SOLN
20.0000 [IU] | Freq: Every day | SUBCUTANEOUS | Status: DC
  Administered 2017-07-22 – 2017-07-26 (×5): 20 [IU] via SUBCUTANEOUS
  Filled 2017-07-22 (×5): qty 0.2

## 2017-07-22 MED ORDER — LORAZEPAM 2 MG/ML IJ SOLN
INTRAMUSCULAR | Status: AC
  Filled 2017-07-22: qty 1

## 2017-07-22 MED ORDER — LORAZEPAM 2 MG/ML IJ SOLN: 1.0000 mg | Freq: Once | INTRAMUSCULAR | Status: DC

## 2017-07-22 MED ORDER — ACETAMINOPHEN 650 MG RE SUPP: 650.0000 mg | RECTAL | Status: DC | PRN

## 2017-07-22 MED ORDER — LORAZEPAM 2 MG/ML IJ SOLN
INTRAMUSCULAR | Status: AC
  Administered 2017-07-22: 1 mg
  Filled 2017-07-22: qty 1

## 2017-07-22 MED ORDER — LORAZEPAM 2 MG/ML IJ SOLN
1.0000 mg | Freq: Once | INTRAMUSCULAR | Status: AC
  Administered 2017-07-22: 1 mg via INTRAVENOUS

## 2017-07-22 MED ORDER — CLOPIDOGREL BISULFATE 75 MG PO TABS
75.0000 mg | ORAL_TABLET | Freq: Every day | ORAL | Status: DC
  Administered 2017-07-23 – 2017-07-26 (×4): 75 mg via ORAL
  Filled 2017-07-22 (×5): qty 1

## 2017-07-22 MED ORDER — AMLODIPINE BESYLATE 2.5 MG PO TABS
2.5000 mg | ORAL_TABLET | Freq: Every day | ORAL | Status: DC
  Administered 2017-07-23: 2.5 mg via ORAL
  Filled 2017-07-22: qty 1

## 2017-07-22 MED ORDER — METOPROLOL SUCCINATE ER 25 MG PO TB24
25.0000 mg | ORAL_TABLET | Freq: Every day | ORAL | Status: DC
  Administered 2017-07-23 – 2017-07-26 (×4): 25 mg via ORAL
  Filled 2017-07-22 (×4): qty 1

## 2017-07-22 MED ORDER — BENAZEPRIL HCL 20 MG PO TABS
10.0000 mg | ORAL_TABLET | Freq: Every day | ORAL | Status: DC
  Administered 2017-07-23: 10 mg via ORAL
  Filled 2017-07-22: qty 1

## 2017-07-22 MED ORDER — INSULIN ASPART 100 UNIT/ML ~~LOC~~ SOLN
0.0000 [IU] | Freq: Three times a day (TID) | SUBCUTANEOUS | Status: DC
  Administered 2017-07-22 – 2017-07-23 (×2): 1 [IU] via SUBCUTANEOUS
  Administered 2017-07-24: 2 [IU] via SUBCUTANEOUS
  Administered 2017-07-25: 1 [IU] via SUBCUTANEOUS

## 2017-07-22 MED ORDER — EZETIMIBE 10 MG PO TABS
10.0000 mg | ORAL_TABLET | Freq: Every day | ORAL | Status: DC
  Administered 2017-07-23 – 2017-07-26 (×4): 10 mg via ORAL
  Filled 2017-07-22 (×4): qty 1

## 2017-07-22 MED ORDER — AMLODIPINE BESY-BENAZEPRIL HCL 2.5-10 MG PO CAPS: 1.0000 | ORAL_CAPSULE | Freq: Every day | ORAL | Status: DC

## 2017-07-22 MED ORDER — ACETAMINOPHEN 325 MG PO TABS: 650.0000 mg | ORAL_TABLET | ORAL | Status: DC | PRN

## 2017-07-22 NOTE — Progress Notes (Addendum)
Called ebcause of mental status change. Patient with agitation, no longer following commands. On exam, he has good strength, keeps eyes tightly closed, states "I need to find a safe place" but no other verbal output and does not follow commands. Recurrent stroke is a possibility, but delirium more likley. Post-ictal state would also be a possibility, though no seizure was seen.    1) Stat head CT 2) if unable to get it wihotu sedation, could use ativan.  3) EEG 4) ABG  Roland Rack, MD [facility] [phone]  If 7pm- 7am, please page neurology on call as listed in [place].

## 2017-07-22 NOTE — Progress Notes (Signed)
SLP Cancellation Note  Patient Details Name: Eric Long MRN: [id number] DOB: 11/17/1940   Cancelled treatment:       Reason Eval/Treat Not Completed: Fatigue/lethargy limiting ability to participate   Pt with increased agitation earlier in morning and had received 4mg  of Ativan. Pt sleeping and unable to arouse. Will attempt at next available opportunity.    Clanton Emanuelson 07/22/2017, 11:08 AM

## 2017-07-22 NOTE — Progress Notes (Signed)
PT Cancellation Note  Patient Details Name: Eric Long MRN: [id number] DOB: [birth year]   Cancelled Treatment:    Reason Eval/Treat Not Completed: Patient at procedure or test/unavailable;Patient's level of consciousness Pt off floor for numerous tests and given 4 mg of ativan to be able to complete these. Will follow up as time allows and pt alert to participate in PT session.   Marguarite Arbour A Shakeena Kafer 07/22/2017, 11:59 AM Wray Kearns, [place], DPT [phone]

## 2017-07-22 NOTE — Progress Notes (Signed)
Called to bedside with neuro change.  Pt sitting up at end of bed, agitated, not following commands.  Dr. Leonel Ramsay to bedside.  Pt transported to CT scan after ativan administered.

## 2017-07-22 NOTE — Progress Notes (Signed)
Pt responds to painful stimuli, but patient still to drowsy to  due neuro assessment or take medications safely. MD Wendee Beavers) notify

## 2017-07-22 NOTE — Progress Notes (Signed)
Patient briefly assessed, due to patient being off unit for procedures

## 2017-07-22 NOTE — Consult Note (Signed)
Neurology Consultation Reason for Consult: Stroke Referring Physician: Quincy Carnes  CC: Unsteadiness  History is obtained from: Patient  HPI: Eric Long is a 77 y.o. male with a history of diabetes, hyperlipidemia who presents with unsteady gait that started on awakening Saturday morning.  He states that he is not certain why he has been unsteady, but just has not felt like he was walking normally.  He therefore presented to the emergency department today.  He denies left-sided weakness, left-sided discoordination, but he has noticed some possible difficulties with his vision, though the exact character of the problems seem to elude him.   LKW: Friday prior to bed tpa given?: no, outside of window   ROS: A 14 point ROS was performed and is negative except as noted in the HPI.   Past Medical History:  Diagnosis Date  . Diabetes mellitus without complication ([place])   . Hyperlipidemia      Family History  Problem Relation Age of Onset  . Diabetes Sister      Social History:  reports that he has quit smoking. he has never used smokeless tobacco. He reports that he does not drink alcohol or use drugs.   Exam: Current vital signs: BP (!) 162/69   Pulse 85   Temp 98.1 F (36.7 C) (Oral)   Resp 17   Ht 5' 10.5" (1.791 m)   Wt 108.9 kg (240 lb)   SpO2 100%   BMI 33.95 kg/m  Vital signs in last 24 hours: Temp:  [98.1 F (36.7 C)-98.2 F (36.8 C)] 98.1 F (36.7 C) (03/18 2354) Pulse Rate:  [85-95] 85 (03/19 0111) Resp:  [16-23] 17 (03/19 0111) BP: (155-172)/(57-89) 162/69 (03/19 0111) SpO2:  [94 %-100 %] 100 % (03/19 0111) Weight:  [108.9 kg (240 lb)] 108.9 kg (240 lb) (03/18 1723)   Physical Exam  Constitutional: Appears well-developed and well-nourished.  Psych: Affect appropriate to situation Eyes: No scleral injection HENT: No OP obstrucion Head: Normocephalic.  Cardiovascular: Normal rate and regular rhythm.  Respiratory: Effort normal, non-labored  breathing GI: Soft.  No distension. There is no tenderness.  Skin: WDI  Neuro: Mental Status: Patient is awake, alert, oriented to person, place, month, year, and situation. Patient is able to give a clear and coherent history. No signs of aphasia  Cranial Nerves: II: He has difficulty counting fingers in the left visual field but is able to distinguish finger movement, he extinguishes to double simultaneous visual stimuli. Pupils are equal, round, and reactive to light.   III,IV, VI: EOMI without ptosis or diploplia.  V: Facial sensation is symmetric to temperature VII: Facial movement is symmetric.  VIII: hearing is intact to voice X: Uvula elevates symmetrically XI: Shoulder shrug is symmetric. XII: tongue is midline without atrophy or fasciculations.  Motor: Tone is normal. Bulk is normal. 5/5 strength was present in all four extremities.  He has slowed movements in the left arm Sensory: Sensation is diminished in the left leg, he endorses symmetric sensation in the left arm, however he extinguishes to double simultaneous stimulation in both the arm and leg. Cerebellar: FNF and HKS are slower on the left than the right.  He frequently misses with the left finger    I have reviewed labs in epic and the results pertinent to this consultation are: CMP- elevated creatinine at 1.4, glucose of 128, otherwise unremarkable CBC-mild normocytic anemia  I have reviewed the images obtained: MRI brain-right posterior MCA infarct  Impression: 77 year old male with right posterior  MCA infarct.  Distribution is suggestive of an embolic event that broke up.  He will need further workup for secondary risk factor modification and therapy.  Recommendations: 1. HgbA1c, fasting lipid panel 2. MRA  of the brain without contrast 3. Frequent neuro checks 4. Echocardiogram 5. Carotid dopplers 6. Prophylactic therapy-Antiplatelet med: Aspirin - dose 325mg  PO or 300mg  PR 7. Risk factor  modification 8. Telemetry monitoring 9. PT consult, OT consult, Speech consult 10. please page stroke NP  Or  PA  Or MD  from 8am -4 pm as this patient will be followed by the stroke team at this point.   You can look them up on [email or web address]      Roland Rack, MD [facility] [phone]  If 7pm- 7am, please page neurology on call as listed in [place].

## 2017-07-22 NOTE — Progress Notes (Signed)
Pt admitted from ED with the diagnosis of stroke, pt alert and oriented, denies any pain at this time, pt settled in bed with call light and family at bedside, tele monitor put and verified on pt, safety concern addressed as well, pt and family reassured, will continue to monitor, v/s stable. Obasogie-Asidi, Odena Mcquaid Efe

## 2017-07-22 NOTE — Progress Notes (Signed)
STROKE TEAM PROGRESS NOTE   HISTORY OF PRESENT ILLNESS (per record) Eric Long is a 77 y.o. male with a history of diabetes, hyperlipidemia who presents with unsteady gait that started on awakening Saturday morning.  He states that he is not certain why he has been unsteady, but just has not felt like he was walking normally.  He therefore presented to the emergency department today.  He denies left-sided weakness, left-sided discoordination, but he has noticed some possible difficulties with his vision, though the exact character of the problems seem to elude him.   LKW: Friday prior to bed tpa given?: no, outside of window   SUBJECTIVE (INTERVAL HISTORY) The patient's wife and son were in the room.  The patient is currently off the floor testing. I examined him and he returned had any was quite sedated from 4 mg of Ativan he was given for agitation. EEG was performed which was normal without epileptiform activity.CT angiogram from this morning shows moderate right middle cerebral artery stenosis with stable right MCA branch infarct without hemorrhagic transformation. There is also moderate proximal right common carotid artery and severe left internal carotid artery and left vertebral arterystenosis    OBJECTIVE Temp:  [97.6 F (36.4 C)-98.2 F (36.8 C)] 97.6 F (36.4 C) (03/19 0207) Pulse Rate:  [85-95] 86 (03/19 0600) Cardiac Rhythm: Sinus tachycardia;Bundle branch block (03/19 0715) Resp:  [16-23] 20 (03/19 0629) BP: (121-172)/(56-93) 140/93 (03/19 0629) SpO2:  [94 %-100 %] 99 % (03/19 0629) Weight:  [238 lb 15.7 oz (108.4 kg)-240 lb (108.9 kg)] 238 lb 15.7 oz (108.4 kg) (03/19 0207)  CBC:  Recent Labs  Lab 07/21/17 1740 07/21/17 1745 07/22/17 0220  WBC 7.7  --  7.0  NEUTROABS 4.6  --   --   HGB 12.7* 12.9* 12.2*  HCT 38.6* 38.0* 37.5*  MCV 93.0  --  92.8  PLT 246  --  932    Basic Metabolic Panel:  Recent Labs  Lab 07/21/17 1740 07/21/17 1745 07/22/17 0220   NA 137 139 137  K 4.1 4.1 3.7  CL 101 103 102  CO2 26  --  26  GLUCOSE 128* 125* 114*  BUN 13 17 15   CREATININE 1.41* 1.40* 1.30*  1.34*  CALCIUM 9.2  --  9.1    Lipid Panel:     Component Value Date/Time   CHOL 274 (H) 07/22/2017 0220   TRIG 218 (H) 07/22/2017 0220   HDL 30 (L) 07/22/2017 0220   CHOLHDL 9.1 07/22/2017 0220   VLDL 44 (H) 07/22/2017 0220   LDLCALC 200 (H) 07/22/2017 0220   HgbA1c:  Lab Results  Component Value Date   HGBA1C 7.6 (H) 07/22/2017   Urine Drug Screen: No results found for: LABOPIA, COCAINSCRNUR, LABBENZ, AMPHETMU, THCU, LABBARB  Alcohol Level No results found for: ETH  IMAGING  Ct Head Wo Contrast Ct Angio Head W Or Wo Contrast Ct Angio Neck W Or Wo Contrast 07/22/2017 IMPRESSION:  Calcified plaque right carotid bifurcation narrowing the internal carotid artery lumen by 55% diameter stenosis.  Tandem 40% diameter stenosis proximal right internal carotid artery.  Severe stenosis origin right external carotid artery.  Moderate stenosis right cavernous carotid. Irregular calcified and noncalcified plaque proximal left internal carotid artery with severe stenosis, 80% diameter stenosis.  Moderate stenosis left cavernous carotid  Severe stenosis origin of left vertebral artery and  moderate stenosis origin of right vertebral artery  Diffuse intracranial atherosclerotic disease as above.  Severe stenosis right M2 segment supplying  acute infarct in the right parietal lobe.    Ct Head Wo Contrast 07/21/2017 IMPRESSION:  1. Hypodensity within the right parietal and temporal lobes consistent with acute to subacute infarct. No hemorrhage or significant midline shift at this time. MRI recommended for further evaluation  2. Mild small vessel ischemic changes of the white matter with atrophy.  Old small infarct in the right frontal lobe.    Mr Brain Wo Contrast 07/21/2017 IMPRESSION:  1. Large area of acute ischemia within the posterior right  hemisphere, involving the posterior right MCA territory and a small portion of the right PCA territory. Involvement of 2 vascular territories may indicate an embolic source.  2. Petechial hemorrhage in the posterior right MCA distribution, but no space-occupying hematoma. No midline shift or other mass effect.  3. Old basal ganglia and cerebellar lacunar infarcts superimposed on chronic microvascular ischemia.     Transthoracic Echocardiogram - pending 07/22/2017     PHYSICAL EXAM Vitals:   07/22/17 0207 07/22/17 0400 07/22/17 0600 07/22/17 0629  BP: (!) 156/65 (!) 155/61 (!) 121/56 (!) 140/93  Pulse: 87 85 86   Resp: 18 18 18 20   Temp: 97.6 F (36.4 C)     TempSrc: Oral     SpO2: 98% 98% 100% 99%  Weight: 238 lb 15.7 oz (108.4 kg)     Height: 5' 10.5" (1.791 m)     obese elderly male who is sedated but not in distress. . Afebrile. Head is nontraumatic. Neck is supple without bruit.    Cardiac exam no murmur or gallop. Lungs are clear to auscultation. Distal pulses are well felt. Neurological Exam ;  Patient is sedated. He barely opens eyes to stimulation and follows only occasional commands. Pupils irregular equal reactive. Doll's eye movements are present. Does not blink to threat from either side. Speech is garbled and can be understood with some difficulty. Tongue midline. No facial weakness. Moves right side purposefully against gravity. Able to move the left side but there is clear mild left-sided weakness. Deep tendon reflexes symmetric. Plantars are downgoing. Gait not tested.   ASSESSMENT/PLAN Mr. Eric Long is a 77 y.o. male with history of hyperlipidemia, previous strokes and diabetes mellitus presenting with unsteady gait. He did not receive IV t-PA due to late presentation.  Stroke: Right MCA territory infarcts -likely from intracranial and extracranial large vessel  atherosclerosis.  Resultant  Left hemiplegia  CT head - Hypodensity Rt parietal and temporal  lobes c/w acute to subacute infarcts.  MRI head - Large area of acute ischemia within the posterior right hemisphere, involving the posterior right MCA territory and a small portion of the right PCA territory. Etiology embolic source unknown  MRA head - not performed  CTA H&N -severe stenosis Rt ext carotid artery, Lt vertebral artery, and Rt M2.  Carotid Doppler - CTA neck  2D Echo - pending  LDL 200  HgbA1c - 7.6  VTE prophylaxis - Lovenox Fall precautions Diet Heart Room service appropriate? Yes; Fluid consistency: Thin  No antithrombotic prior to admission, now on aspirin 325 mg daily  Patient counseled to be compliant with his antithrombotic medications  Ongoing aggressive stroke risk factor management  Therapy recommendations:  pending  Disposition:  Pending  Hypertension  Stable  Permissive hypertension (OK if < 220/120) but gradually normalize in 5-7 days  Long-term BP goal normotensive  Hyperlipidemia  Lipid lowering medication PTA: Crestor 40 mg daily and Zetia 10 mg daily  LDL 200, goal < 70  Current lipid lowering medication: Crestor 40 mg daily and Zetia 10 mg daily  Verify patient compliance with medication.  Continue statin at discharge  Diabetes  HgbA1c 7.6, goal < 7.0  Uncontrolled  Other Stroke Risk Factors  Advanced age  Former cigarette smoker - quit  Obesity, Body mass index is 33.81 kg/m., recommend weight loss, diet and exercise as appropriate   Old basal ganglia and cerebellar lacunar infarcts by MRI   Other Active Problems  Mild anemia  Elevated creatinine  Severe intracranial and extracranial atherosclerosis as documented above on CT angiogram.  Petechial hemorrhage in the posterior right MCA distribution   Plan / Recommendations   Stroke workup - await  2D echo.  Add Plavix    Hospital day # 1  Mikey Bussing PA-C [facility] Pager [phone] 07/22/2017, 11:27 AM I have personally  examined this patient, reviewed notes, independently viewed imaging studies, participated in medical decision making and plan of care.ROS completed by me personally and pertinent positives fully documented  I have made any additions or clarifications directly to the above note. Agree with note Sandy Salaam has presented with a right MCA infarct likely secondary to intracranial or extracranial large vessel atherosclerosis. Recommend dual antiplatelet therapy for 3 months and aggressive risk factor modification. Check swallow eval and physical occupational therapy and rehabilitation consults. Discussed with Dr. Wendee Beavers.and patient's family at the bedside Greater than 50% time during this 35 minute visit was spent on counseling and coordination of care about his stroke and answered questions.  Antony Contras, MD Medical Director [facility] Pager: [phone] 07/22/2017 2:10 PM   To contact Stroke Continuity provider, please refer to [email or web address] After hours, contact General Neurology

## 2017-07-22 NOTE — Progress Notes (Signed)
Pt admitted earlier this am by my associate please refer to H&P for details regarding assessment and plan. But in short patient is a  77 y.o. male with history of hypertension, diabetes mellitus type 2, chronic kidney disease, peripheral vascular disease was referred to the ER after patient has CT head was abnormal. Patient diagnosed with CVA and neurology subsequently consulted. Patient undergoing investigational work for stroke.  No new complaints on my visit  Gen: pt in nad, alert and awake Cv: no cyanosis Pulm: No increased work of breathing, no wheezes  Velvet Bathe MD

## 2017-07-22 NOTE — H&P (Signed)
History and Physical    Eric Long PZW:[id number] DOB: 1940/12/14 DOA: 07/21/2017  PCP: Lawerance Cruel, MD  Patient coming from: Home.  Chief Complaint: Unsteady gait.  HPI: Eric Long is a 77 y.o. male with history of hypertension, diabetes mellitus type 2, chronic kidney disease, peripheral vascular disease was referred to the ER after patient has CT head was abnormal.  As per the patient and his wife patient was doing fine on Friday July 18, 2017, 4 days ago when he was talking with his daughter that night.  The following day morning patient's daughter went to check on him since patient's wife had gone to [place].  Patient was found to be confused.  Patient's wife return back to the evening.  Following which patient was noticed to have increasing unsteady gait which continued and patient was taken to the PCP yesterday morning.  Patient's PCP ordered stat CT head which showed features concerning for stroke and was referred to the ER.  Patient denies any difficulty swallowing speaking or visual symptoms.  Has not lost function of his upper or lower extremity but has been having unsteady gait.  ED Course: In the ER patient had MRI of the brain which shows stroke concerning areas of posterior right MCA and right PCA.  Neurologist on-call was consulted patient was placed on aspirin and admitted for further stroke workup.  Patient on walking was felt to have been dragging his left leg.  Otherwise has good strength in all extremities.  Review of Systems: As per HPI, rest all negative.   Past Medical History:  Diagnosis Date  . Diabetes mellitus without complication ([place])   . Hyperlipidemia     Past Surgical History:  Procedure Laterality Date  . FEMORAL-FEMORAL BYPASS GRAFT Bilateral 2007     reports that he has quit smoking. he has never used smokeless tobacco. He reports that he does not drink alcohol or use drugs.  Allergies  Allergen Reactions  . Aspirin     Diarrhea     Family History  Problem Relation Age of Onset  . Diabetes Sister     Prior to Admission medications   Medication Sig Start Date End Date Taking? Authorizing Provider  amlodipine-benazepril (LOTREL) 2.5-10 MG per capsule Take 1 capsule by mouth daily.  12/08/13  Yes [provider]  ezetimibe (ZETIA) 10 MG tablet Take 1 tablet (10 mg total) by mouth daily. 03/27/16  Yes Elayne Snare, MD  insulin aspart (NOVOLOG FLEXPEN) 100 UNIT/ML FlexPen 5-7 units at main meal 02/04/17  Yes Elayne Snare, MD  liraglutide (VICTOZA) 18 MG/3ML SOPN INJECT 0.3 MLS  (1.8 MG TOTAL) INTO THE SKIN ONCE A DAY 11/19/16  Yes Elayne Snare, MD  metoprolol succinate (TOPROL-XL) 25 MG 24 hr tablet Take 25 mg by mouth daily.  12/08/13  Yes [provider]  rosuvastatin (CRESTOR) 40 MG tablet Take 40 mg by mouth daily.  11/22/14  Yes [provider]  TRESIBA FLEXTOUCH 100 UNIT/ML SOPN FlexTouch Pen INJECT 25 UNITS SUBCUTANEOUSLY ONCE DAILY 07/01/17  Yes Elayne Snare, MD  glucose blood (ONETOUCH VERIO) test strip Use to check blood sugar 2 times per day by rotation of meals. Dx code: E11.9 Patient not taking: Reported on 07/21/2017 01/25/16   Elayne Snare, MD  RELION PEN NEEDLES 32G X 4 MM MISC USE ONE PEN NEEDLE TWICE DAILY Patient not taking: Reported on 07/21/2017 06/30/17   Elayne Snare, MD    Physical Exam: Vitals:   07/21/17 2045 07/21/17  2351 07/21/17 2354 07/22/17 0111  BP: (!) 155/81  (!) 169/57 (!) 162/69  Pulse:   87 85  Resp: 19  16 17   Temp:  98.1 F (36.7 C) 98.1 F (36.7 C)   TempSrc:   Oral   SpO2:   100% 100%  Weight:      Height:          Constitutional: Moderately built and nourished. Vitals:   07/21/17 2045 07/21/17 2351 07/21/17 2354 07/22/17 0111  BP: (!) 155/81  (!) 169/57 (!) 162/69  Pulse:   87 85  Resp: 19  16 17   Temp:  98.1 F (36.7 C) 98.1 F (36.7 C)   TempSrc:   Oral   SpO2:   100% 100%  Weight:      Height:       Eyes: Anicteric no pallor. ENMT: No  discharge from the ears eyes nose or mouth. Neck: No mass felt.  No neck rigidity.  No carotid bruits appreciated. Respiratory: No rhonchi or crepitations. Cardiovascular: S1-S2 heard no murmurs appreciated. Abdomen: Soft nontender bowel sounds present. Musculoskeletal: No edema.  No joint effusion. Skin: No rash. Neurologic: Alert awake oriented to time place and person.  Moves all extremities 5 x 5.  No facial asymmetry.  Tongue is midline.  Pupils are equal and reacting to light. Psychiatric: Appears normal.   Labs on Admission: I have personally reviewed following labs and imaging studies  CBC: Recent Labs  Lab 07/21/17 1740 07/21/17 1745  WBC 7.7  --   NEUTROABS 4.6  --   HGB 12.7* 12.9*  HCT 38.6* 38.0*  MCV 93.0  --   PLT 246  --    Basic Metabolic Panel: Recent Labs  Lab 07/21/17 1740 07/21/17 1745  NA 137 139  K 4.1 4.1  CL 101 103  CO2 26  --   GLUCOSE 128* 125*  BUN 13 17  CREATININE 1.41* 1.40*  CALCIUM 9.2  --    GFR: Estimated Creatinine Clearance: 53.3 mL/min (A) (by C-G formula based on SCr of 1.4 mg/dL (H)). Liver Function Tests: Recent Labs  Lab 07/21/17 1740  AST 18  ALT 14*  ALKPHOS 102  BILITOT 0.4  PROT 8.0  ALBUMIN 3.6   No results for input(s): LIPASE, AMYLASE in the last 168 hours. No results for input(s): AMMONIA in the last 168 hours. Coagulation Profile: Recent Labs  Lab 07/21/17 1740  INR 0.98   Cardiac Enzymes: No results for input(s): CKTOTAL, CKMB, CKMBINDEX, TROPONINI in the last 168 hours. BNP (last 3 results) No results for input(s): PROBNP in the last 8760 hours. HbA1C: No results for input(s): HGBA1C in the last 72 hours. CBG: Recent Labs  Lab 07/21/17 1734  GLUCAP 124*   Lipid Profile: No results for input(s): CHOL, HDL, LDLCALC, TRIG, CHOLHDL, LDLDIRECT in the last 72 hours. Thyroid Function Tests: No results for input(s): TSH, T4TOTAL, FREET4, T3FREE, THYROIDAB in the last 72 hours. Anemia Panel: No  results for input(s): VITAMINB12, FOLATE, FERRITIN, TIBC, IRON, RETICCTPCT in the last 72 hours. Urine analysis:    Component Value Date/Time   COLORURINE YELLOW 03/17/2015 [employer] 03/17/2015 1142   LABSPEC >=1.030 (A) 03/17/2015 1142   PHURINE 5.5 03/17/2015 1142   GLUCOSEU NEGATIVE 03/17/2015 1142   HGBUR NEGATIVE 03/17/2015 1142   BILIRUBINUR SMALL (A) 03/17/2015 1142   KETONESUR TRACE (A) 03/17/2015 1142   UROBILINOGEN 0.2 03/17/2015 1142   NITRITE NEGATIVE 03/17/2015 1142   LEUKOCYTESUR NEGATIVE 03/17/2015 1142  Sepsis Labs: @LABRCNTIP (procalcitonin:4,lacticidven:4) )No results found for this or any previous visit (from the past 240 hour(s)).   Radiological Exams on Admission: Ct Head Wo Contrast  Result Date: 07/21/2017 CLINICAL DATA:  Disorientation and unsteady gait for 2 days EXAM: CT HEAD WITHOUT CONTRAST TECHNIQUE: Contiguous axial images were obtained from the base of the skull through the vertex without intravenous contrast. COMPARISON:  None. FINDINGS: Brain: Hypodensity within the right parietal and superior temporal lobes consistent with acute to subacute infarct. No definitive hemorrhage is seen. Encephalomalacia within the right posterior frontal lobe consistent with prior infarct. Age indeterminate hypodensity within the right frontal parietal white matter. Mild small vessel ischemic changes of the white matter. Probable old lacunar infarcts in the bilateral caudate. Mild atrophy. Nonenlarged ventricles. No midline shift. Fourth ventricle is patent. Basilar cisterns are patent. Vascular: No hyperdense vessels.  Carotid vascular calcification. Skull: No fracture or suspicious lesion. Sinuses/Orbits: No acute finding. Other: None IMPRESSION: 1. Hypodensity within the right parietal and temporal lobes consistent with acute to subacute infarct. No hemorrhage or significant midline shift at this time. MRI recommended for further evaluation 2. Mild small vessel  ischemic changes of the white matter with atrophy. Old small infarct in the right frontal lobe. Critical Value/emergent results were called by telephone at the time of interpretation on 07/21/2017 at 4:09 pm to [facility] BRAKE , who verbally acknowledged these results. Electronically Signed   By: Donavan Foil M.D.   On: 07/21/2017 16:09   Mr Brain Wo Contrast  Result Date: 07/21/2017 CLINICAL DATA:  CTA.  Loss of balance.  Fall. EXAM: MRI HEAD WITHOUT CONTRAST TECHNIQUE: Multiplanar, multiecho pulse sequences of the brain and surrounding structures were obtained without intravenous contrast. COMPARISON:  Head CT 07/21/2017 FINDINGS: Brain: The midline structures are normal. Large area of abnormal diffusion restriction within the posterior right MCA territory. There is a small area of diffusion restriction within the right PCA territory. Multiple old cerebellar infarcts and basal ganglia lacunar infarcts. No mass lesion, hydrocephalus, dural abnormality or extra-axial collection. Early confluent hyperintense T2-weighted signal of the periventricular and deep white matter, most commonly due to chronic ischemic microangiopathy. There is edema within the posterior right hemisphere in the distribution of diffusion abnormality. No age-advanced or lobar predominant atrophy. There is petechial hemorrhage within the posterior right MCA territory, but no space-occupying hematoma. Vascular: Major intracranial arterial and venous sinus flow voids are preserved. Skull and upper cervical spine: The visualized skull base, calvarium, upper cervical spine and extracranial soft tissues are normal. Sinuses/Orbits: No fluid levels or advanced mucosal thickening. No mastoid or middle ear effusion. Normal orbits. IMPRESSION: 1. Large area of acute ischemia within the posterior right hemisphere, involving the posterior right MCA territory and a small portion of the right PCA territory. Involvement of 2 vascular territories may indicate  an embolic source. 2. Petechial hemorrhage in the posterior right MCA distribution, but no space-occupying hematoma. No midline shift or other mass effect. 3. Old basal ganglia and cerebellar lacunar infarcts superimposed on chronic microvascular ischemia. Electronically Signed   By: Ulyses Jarred M.D.   On: 07/21/2017 22:59    EKG: Independently reviewed.  Normal sinus rhythm.  Assessment/Plan Principal Problem:   Acute CVA (cerebrovascular accident) ([place]) Active Problems:   Chronic kidney disease, stage III (moderate) ([facility])   Essential hypertension   Type 2 diabetes mellitus with renal manifestations ([place])    1. Acute CVA -appreciate neurology consult.  Patient has passed swallow.  We will get physical therapy consult.  Patient is on statins aspirin.  Check MRA brain 2D echo carotid Doppler hemoglobin A1c lipid panel.  2. Hypertension -will allow for permissive hypertension.  Patient on metoprolol and Lotrel. 3. Diabetes mellitus type 2 -blood sugar is around 100s.  Will hold Victoza I have decreased patient's long-acting insulin dose from 25-20 units.  Sliding scale coverage.  Follow hemoglobin A1c and CBGs closely. 4. Chronic kidney disease stage III -follow metabolic panel. 5. Normocytic normochromic anemia -if there is no further decline in hemoglobin further workup as outpatient. 6. Peripheral vascular disease -on statins and aspirin.   DVT prophylaxis: Lovenox. Code Status: Full code. Family Communication: Patient's wife. Disposition Plan: Home. Consults called: Neurology. Admission status: Inpatient.   Rise Patience MD [facility] Pager [phone].  If 7PM-7AM, please contact night-coverage [email or web address] Password [email or web address]  07/22/2017, 1:49 AM

## 2017-07-22 NOTE — Procedures (Signed)
ELECTROENCEPHALOGRAM REPORT  Date of Study: 07/22/2017  Patient's Name: Eric Long MRN: [id number] Date of Birth: 1941-02-02  Referring Provider: Dr. Roland Rack  Clinical History: This is a 77 year old man with altered mental status.  Medications: Ativan given 2 hours prior to EEG Tylenol Norvasc Aspirin Lotension Plavix Zetia Insulin Crestor  Technical Summary: A multichannel digital EEG recording measured by the international 10-20 system with electrodes applied with paste and impedances below 5000 ohms performed in our laboratory with EKG monitoring in a predominantly drowsy and asleep patient.  Hyperventilation and photic stimulation were not performed.  The digital EEG was referentially recorded, reformatted, and digitally filtered in a variety of bipolar and referential montages for optimal display.    Description: The patient is predominantly drowsy and asleep during the recording. There is no clear posterior dominant rhythm. The background consists of sleep architecture with diffuse theta and delta slowing, poorly formed vertex waves and sleep spindles seen. Hyperventilation and photic stimulation were not performed.  There were no epileptiform discharges or electrographic seizures seen.    EKG lead was unremarkable.  Impression: This predominantly drowsy and asleep EEG is normal.    Clinical Correlation: A normal EEG does not exclude a clinical diagnosis of epilepsy.  If further clinical questions remain, repeat wake EEG may be helpful.  Clinical correlation is advised.   Ellouise Newer, M.D.

## 2017-07-22 NOTE — Progress Notes (Signed)
  Echocardiogram 2D Echocardiogram has been performed.  [name] F 07/22/2017, 9:18 AM

## 2017-07-22 NOTE — Progress Notes (Signed)
OT Cancellation Note  Patient Details Name: Eric Long MRN: [id number] DOB: [birth year]   Cancelled Treatment:    Reason Eval/Treat Not Completed: Medical issues which prohibited therapy.  Noted stat CT ordered due to AMS. Will check back.  Lora Glomski 07/22/2017, 7:49 AM  Lesle Chris, OTR/L [phone] 07/22/2017

## 2017-07-22 NOTE — Progress Notes (Signed)
EEG completed; results pending.    

## 2017-07-22 NOTE — Progress Notes (Signed)
Pt observed to be hard to arouse at 0600, but reacts to painful stimuli, non verbal with left gaze preference, was able to sit pt up but still non verbal and do not follow command, rapid response RN Ronalee Belts and [place]) called, Pt became agitated and combative, also paged Dr Leonel Ramsay who came up to see pt, ordered a stat CT, pt taken down at 0640 after 1mg  of Ativan as ordered by Dr Leonel Ramsay. Pt had a total dose of 4mg  of Ativan down in CT as he was still combative, CT completed and was brought back to room at [place], wife at bedside reassured. [place]As[place], Lain Tetterton Efe

## 2017-07-23 DIAGNOSIS — E1122 Type 2 diabetes mellitus with diabetic chronic kidney disease: Secondary | ICD-10-CM

## 2017-07-23 DIAGNOSIS — I5189 Other ill-defined heart diseases: Secondary | ICD-10-CM

## 2017-07-23 DIAGNOSIS — I10 Essential (primary) hypertension: Secondary | ICD-10-CM

## 2017-07-23 DIAGNOSIS — E785 Hyperlipidemia, unspecified: Secondary | ICD-10-CM

## 2017-07-23 DIAGNOSIS — N183 Chronic kidney disease, stage 3 (moderate): Secondary | ICD-10-CM

## 2017-07-23 DIAGNOSIS — I519 Heart disease, unspecified: Secondary | ICD-10-CM

## 2017-07-23 DIAGNOSIS — I639 Cerebral infarction, unspecified: Secondary | ICD-10-CM

## 2017-07-23 DIAGNOSIS — D62 Acute posthemorrhagic anemia: Secondary | ICD-10-CM

## 2017-07-23 LAB — GLUCOSE, CAPILLARY
GLUCOSE-CAPILLARY: 133 mg/dL — AB (ref 65–99)
Glucose-Capillary: 84 mg/dL (ref 65–99)
Glucose-Capillary: 101 mg/dL — ABNORMAL HIGH (ref 65–99)
Glucose-Capillary: 107 mg/dL — ABNORMAL HIGH (ref 65–99)

## 2017-07-23 NOTE — Evaluation (Addendum)
Physical Therapy Evaluation Patient Details Name: Eric Long MRN: [id number] DOB: January 01, 1941 Today's Date: 07/23/2017   History of Present Illness  77 y.o.male with a history of diabetes, hyperlipidemiawho presents with unsteady gait. MRI: Large area of acute ischemia within the posterior right hemisphere, involving the posterior right MCA territory and a small portion of the right PCA territory.  Clinical Impression  Pt admitted with above diagnosis. Pt currently with functional limitations due to the deficits listed below (see PT Problem List). At the time of PT eval pt demonstrated L side inattention, LLE weakness, incoordination, and impaired attention and problem solving. He was able to mobilize around the room with RW and up to +2 mod assist for safety, however was running into objects on the L and required increased time and step-by-step cues to correct. PTA pt independent with all mobility and ADL's and appears to have strong family support. Feel this patient will thrive in the inpatient rehab setting and progress well towards functional independence. Acutely, pt will benefit from skilled PT to increase their independence and safety with mobility to allow discharge to the venue listed below.       Follow Up Recommendations CIR;Supervision/Assistance - 24 hour    Equipment Recommendations  Rolling walker with 5" wheels    Recommendations for Other Services       Precautions / Restrictions Precautions Precautions: Fall;Other (comment) Precaution Comments: Left visual deficits Restrictions Weight Bearing Restrictions: No      Mobility  Bed Mobility Overal bed mobility: Needs Assistance Bed Mobility: Supine to Sit     Supine to sit: Min guard     General bed mobility comments: Min Guard for safety demonstating WFL strength  Transfers Overall transfer level: Needs assistance Equipment used: Rolling walker (2 wheeled) Transfers: Sit to/from Stand Sit to Stand: Min  assist;+2 safety/equipment         General transfer comment: Min A to steady in static standing. +2 for safety. Noted left lateral lean. Mod A for dynamic balance  Ambulation/Gait Ambulation/Gait assistance: Mod assist;+2 safety/equipment Ambulation Distance (Feet): 25 Feet Assistive device: Rolling walker (2 wheeled) Gait Pattern/deviations: Step-through pattern;Trunk flexed;Wide base of support;Antalgic Gait velocity: Decreased Gait velocity interpretation: Below normal speed for age/gender General Gait Details: Antalgic with wide BOS and LLE external rotation. Pt taking very large steps and at times outside of the walker. Pt running into the walls and objects on the left side and required max cues to correct.   Stairs            Wheelchair Mobility    Modified Rankin (Stroke Patients Only) Modified Rankin (Stroke Patients Only) Pre-Morbid Rankin Score: No symptoms Modified Rankin: Moderately severe disability     Balance Overall balance assessment: Needs assistance Sitting-balance support: No upper extremity supported;Feet supported Sitting balance-Leahy Scale: Fair Sitting balance - Comments: Able to maintain static sitting. posterior lean noted with dyanmic movement Postural control: Posterior lean Standing balance support: Bilateral upper extremity supported;During functional activity Standing balance-Leahy Scale: Poor Standing balance comment: Reliant on UE support and physical A                              Pertinent Vitals/Pain Pain Assessment: No/denies pain    Home Living Family/patient expects to be discharged to:: Private residence Living Arrangements: Spouse/significant other Available Help at Discharge: Family;Available 24 hours/day Type of Home: House Home Access: Stairs to enter Entrance Stairs-Rails: None Entrance Stairs-Number of Steps: 2  Home Layout: Two level;Able to live on main level with bedroom/bathroom;Laundry or work area in  [place]: Kasandra Knudsen - single point      Prior Function Level of Independence: Independent         Comments: ADLs, IADLs, driving, and enjoys writing. 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 Decreased finger opposition and coordination as seen during ADLs and testing. Pt with undershoot and overshooting.  LUE Sensation: WNL(Reporting no sensation deficits) LUE Coordination: decreased fine motor;decreased gross motor    Lower Extremity Assessment Lower Extremity Assessment: LLE deficits/detail LLE Deficits / Details: With MMT, noted decreased strength in quads, hamstrings and hip flexors.  LLE Sensation: history of peripheral neuropathy(bilaterally, L worse than R) LLE Coordination: decreased gross motor    Cervical / Trunk Assessment Cervical / Trunk Assessment: Other exceptions Cervical / Trunk Exceptions: Increased body habitus  Communication   Communication: Expressive difficulties  Cognition Arousal/Alertness: Awake/alert Behavior During Therapy: Flat affect Overall Cognitive Status: Impaired/Different from baseline Area of Impairment: Attention;Memory;Following commands;Safety/judgement;Awareness;Problem solving                   Current Attention Level: Sustained Memory: Decreased short-term memory Following Commands: Follows one step commands with increased time Safety/Judgement: Decreased awareness of safety;Decreased awareness of deficits Awareness: Emergent Problem Solving: Slow processing;Decreased initiation;Difficulty sequencing;Requires verbal cues;Requires tactile cues General Comments: Pt presenting with decreased processing and requiring increased time. Pt with decreased problem solving as seen by poor navigation around room. Noted left  inattention. Pt walking straight into a wall and then required Max cues to turn away from wall to continue walking.      General Comments General comments (skin integrity, edema, etc.): son present throughout session. Daughter present at 74 and end of session    Exercises     Assessment/Plan    PT Assessment Patient needs continued PT services  PT Problem List Decreased strength;Decreased range of motion;Decreased activity tolerance;Decreased balance;Decreased mobility;Decreased knowledge of use of DME;Decreased safety awareness;Decreased knowledge of precautions;Pain       PT Treatment Interventions DME instruction;Gait training;Stair training;Functional mobility training;Therapeutic activities;Therapeutic exercise;Neuromuscular re-education;Patient/family education    PT Goals (Current goals can be found in the Care Plan section)  Acute Rehab PT Goals Patient Stated Goal: Go home and return to PLOF PT Goal Formulation: With patient/family Time For Goal Achievement: 08/06/2017 Potential to Achieve Goals: Good    Frequency Min 4X/week   Barriers to discharge        Co-evaluation PT/OT/SLP Co-Evaluation/Treatment: Yes Reason for Co-Treatment: Complexity of the patient's impairments (multi-system involvement);To address functional/ADL transfers;For patient/therapist safety PT goals addressed during session: Mobility/safety with mobility;Balance;Proper use of DME OT goals addressed during session: ADL's and self-care       AM-PAC PT "6 Clicks" Daily Activity  Outcome Measure Difficulty turning over in bed (including adjusting bedclothes, sheets and blankets)?: A Little Difficulty moving from lying on back to sitting on the side of the bed? : A Lot Difficulty sitting down on and standing up from a chair with arms (e.g., wheelchair, bedside commode, etc,.)?: Unable Help needed moving to and from a bed to chair (including a wheelchair)?: A Little Help needed walking in  hospital room?: A Little Help needed climbing 3-5 steps with a railing? : Total 6 Click Score: 13  End of Session Equipment Utilized During Treatment: Gait belt Activity Tolerance: Patient tolerated treatment well Patient left: in chair;with call bell/phone within reach;with chair alarm set;with family/visitor present Nurse Communication: Mobility status PT Visit Diagnosis: Unsteadiness on feet (R26.81);Other symptoms and signs involving the nervous system (R29.898);Other abnormalities of gait and mobility (R26.89)    Time: 0981-1914 PT Time Calculation (min) (ACUTE ONLY): 36 min   Charges:   PT Evaluation $PT Eval Moderate Complexity: 1 Mod     PT G Codes:        Rolinda Roan, PT, DPT Acute Rehabilitation Services Pager: [phone]   Thelma Comp 07/23/2017, 3:52 PM

## 2017-07-23 NOTE — Progress Notes (Signed)
Inpatient Rehabilitation  Per OT request, patient was screened by Zuzu Befort for appropriateness for an Inpatient Acute Rehab consult.  At this time we are recommending an Inpatient Rehab consult.  Text paged MD to notify; please order if you are agreeable.    Nandika Stetzer, M.A., CCC/SLP Admission Coordinator  [facility]  Cell [phone]  

## 2017-07-23 NOTE — Progress Notes (Signed)
PROGRESS NOTE  Eric Long SWN:[id number] DOB: Oct 10, 1940 DOA: 07/21/2017 PCP: Lawerance Cruel, MD  Brief Narrative: 77 year old man who was noted to have some confusion as an outpatient as well as unsteady gait and therefore was taken to his primary care physician 3/18, CT head was obtained as an outpatient which was concerning for stroke and so the patient was sent to the emergency department. Initial examination in the emergency department showed some speech delay, dissymmetry and ataxia of the left arm and hesitancy following commands.  MRI revealed large right-sided stroke involving MCA and portions of PCA territory.  Admitted for neurology evaluation.  Initial recommendation was for stroke workup.  Subsequently the patient had a mental status change, became agitated, then nonverbal.  Assessment/Plan Right posterior MCA infarct.  No TPA secondary to late presentation.  Thought secondary to intracranial or extracranial large vessel atherosclerosis neurology evaluated the patient recommended further investigation.  EEG was normal.  CT angiogram showed moderate right middle cerebral artery stenosis with stable right MCA branch infarct without hemorrhagic transformation.  Also seen moderate proximal right common carotid artery and severe left internal carotid and vertebral artery stenosis.  LDL 200.  Hemoglobin A1c 7.6. --Stroke service recommended dual antiplatelet therapy for 3 months and aggressive risk factor modification. --Speech therapy has recommended inpatient rehab.  Physical and occupational therapy have not yet evaluated the patient.  Acute encephalopathy --Secondary to acute stroke.  Appears resolved.  Diabetes mellitus type 2 --Stable.  Essential hypertension --Permissive hypertension in the short-term.  Long-term goal for normotension.  Hyperlipidemia, LDL 200.  Goal less than 70. --Continue Crestor, Zetia  Chronic kidney disease stage III --Appears to be at  baseline.  Normocytic anemia --Stable.  Peripheral vascular disease  --Continue aspirin and statin  DVT prophylaxis: Enoxaparin Code Status: Full Family Communication: Son, daughter, wife Disposition Plan: Likely rehab   Murray Hodgkins, MD  [facility] Direct contact: [phone] --Via [place]  --[email or web address]; password [email or web address]  7PM-7AM contact night coverage as above 07/23/2017, 2:19 PM  LOS: 2 days   Consultants:  Neurology   Procedures:  EEG Impression: This predominantly drowsy and asleep EEG is normal.    Echo Study Conclusions  - Left ventricle: The cavity size was normal. Systolic function was   normal. The estimated ejection fraction was in the range of 60%   to 65%. Wall motion was normal; there were no regional wall   motion abnormalities. Doppler parameters are consistent with   abnormal left ventricular relaxation (grade 1 diastolic   dysfunction).  Interval history/Subjective: Feels fine.  Some difficulty with thought process.  Objective: Vitals:  Vitals:   07/23/17 0900 07/23/17 1148  BP: (!) 142/55 (!) 142/60  Pulse: 85 77  Resp: 18 18  Temp: 97.9 F (36.6 C) 98.2 F (36.8 C)  SpO2: 96% 98%    Exam:  Constitutional:  . Appears calm and comfortable Eyes:  . pupils and irises appear normal . Normal lids  ENMT:  . grossly normal hearing  Respiratory:  . CTA bilaterally, no w/r/r.  . Respiratory effort normal Cardiovascular:  . RRR, no m/r/g . No LE extremity edema   Musculoskeletal:  . RUE, LUE, RLE, LLE   o strength and tone normal, no atrophy o No tenderness Neurologic:  . CN appear grossly intact Psychiatric:  . Mental status o Mood, affect appropriate  I have personally reviewed the following:   Labs:  No labs today.  Review of lab since admission.  ABG was notable  only for hypoxemia.  Creatinine at baseline consistent with chronic kidney disease stage III.  LFTs unremarkable.  Troponin negative.  LDL  200.  Triglycerides 218.  CBC unremarkable.  Hemoglobin A1c 7.6.  Imaging studies:  MRI showed large area of acute ischemia of right posterior hemisphere, involving right posterior MCA territory and small portion of right PCA territory.  Petechial hemorrhage posterior right MCA distribution but no hematoma.  No midline shift or mass-effect.  CT head, neck noted.  Medical tests:  EKG showed sinus rhythm with right bundle branch block.  Scheduled Meds: . aspirin EC  325 mg Oral Daily  . clopidogrel  75 mg Oral Daily  . enoxaparin (LOVENOX) injection  40 mg Subcutaneous Daily  . ezetimibe  10 mg Oral Daily  . insulin aspart  0-9 Units Subcutaneous TID WC  . insulin glargine  20 Units Subcutaneous Daily  . metoprolol succinate  25 mg Oral Daily  . rosuvastatin  40 mg Oral Daily   Continuous Infusions:  Principal Problem:   Acute CVA (cerebrovascular accident) ([place]) Active Problems:   Chronic kidney disease, stage III (moderate) ([facility])   Essential hypertension   Type 2 diabetes mellitus with renal manifestations ([place])   Hyperlipidemia   LOS: 2 days

## 2017-07-23 NOTE — Consult Note (Signed)
Physical Medicine and Rehabilitation Consult   Reason for Consult: Functional deficits due to unsteady gait Referring Physician: Dr. Leonie Man   HPI: Eric Long is a 77 y.o. male with history of type 2 diabetes mellitus and hyperlipidemia who was admitted on 07/22/2017 with unsteady gait and left-sided weakness times 2 days.  History taken from chart review, patient, and family.  CT of head reviewed, suggesting right infarct. Per report, hypodensity parietal and temporal lobes consistent with acute to subacute infarct.  CTA head neck showed 40-55% right ICA stenosis 80% left ICA stenosis severe stenosis origin left vertebral artery and moderate stenosis right vertebral artery.  MRI of brain done showing large area of acute ischemia within the posterior right hemisphere involving right MCA and small portion right PCA question embolic petechial hemorrhage posterior right MCA distribution and old basal ganglia cerebellar lacunar infarcts.  2D echo done showing EF 60-65% no wall abnormalities and grade 1 diastolic dysfunction.  EEG was normal study no epileptiform discharges noted.  Dr. Leonie Man felt right MCA territory infarct due to intracranial and extracranial large vessel vessel atherosclerosis --dual antiplatelet recommended and patient to participate in  Stroke Afib trial.  Patient with resultant left visual field deficits, poor safety awareness, balance deficits and cognitive deficits.  CIR recommended due to functional deficits   Review of Systems  Constitutional: Negative for chills and fever.  HENT: Negative for hearing loss and tinnitus.   Eyes: Negative for blurred vision and double vision.  Respiratory: Negative for cough and shortness of breath.   Cardiovascular: Negative for chest pain and palpitations.  Gastrointestinal: Negative for heartburn and nausea.  Genitourinary: Negative for dysuria.  Musculoskeletal: Negative for back pain and myalgias.  Skin: Negative for itching  and rash.  Neurological: Negative for dizziness, sensory change, speech change, focal weakness and headaches.  Psychiatric/Behavioral: The patient does not have insomnia.   All other systems reviewed and are negative.     Past Medical History:  Diagnosis Date  . Diabetes mellitus without complication ([place])   . Hyperlipidemia     Past Surgical History:  Procedure Laterality Date  . FEMORAL-FEMORAL BYPASS GRAFT Bilateral 2007    Family History  Problem Relation Age of Onset  . Diabetes Sister     Social History: Married was independent and driving prior to admission.  Retired [employer] during the day.  Reports that he has quit smoking. he has never used smokeless tobacco. He reports that he does not drink alcohol or use drugs.     Allergies  Allergen Reactions  . Aspirin     Diarrhea    Medications Prior to Admission  Medication Sig Dispense Refill  . amlodipine-benazepril (LOTREL) 2.5-10 MG per capsule Take 1 capsule by mouth daily.   0  . ezetimibe (ZETIA) 10 MG tablet Take 1 tablet (10 mg total) by mouth daily. 30 tablet 3  . insulin aspart (NOVOLOG FLEXPEN) 100 UNIT/ML FlexPen 5-7 units at main meal 15 mL 1  . liraglutide (VICTOZA) 18 MG/3ML SOPN INJECT 0.3 MLS  (1.8 MG TOTAL) INTO THE SKIN ONCE A DAY 9 mL 3  . metoprolol succinate (TOPROL-XL) 25 MG 24 hr tablet Take 25 mg by mouth daily.   0  . rosuvastatin (CRESTOR) 40 MG tablet Take 40 mg by mouth daily.     . TRESIBA FLEXTOUCH 100 UNIT/ML SOPN FlexTouch Pen INJECT 25 UNITS SUBCUTANEOUSLY ONCE DAILY 15 pen 1  . glucose blood (ONETOUCH VERIO) test strip Use to  check blood sugar 2 times per day by rotation of meals. Dx code: E11.9 (Patient not taking: Reported on 07/21/2017) 100 each 3  . RELION PEN NEEDLES 32G X 4 MM MISC USE ONE PEN NEEDLE TWICE DAILY (Patient not taking: Reported on 07/21/2017) 200 each 2    Home: [place] expects to be discharged to:: Private residence Living  Arrangements: Spouse/significant other Available Help at Discharge: Family, Available 24 hours/day Type of Home: House Home Access: Stairs to enter [employer] of Steps: 2 Entrance Stairs-Rails: None Home Layout: Two level, Able to live on main level with bedroom/bathroom, Laundry or work area in basement Alternate Therapist, sports of Steps: flight Bathroom Shower/Tub: Multimedia programmer: Standard(Comfort height) Bathroom Accessibility: Yes Home Equipment: Radio producer - single point  Lives With: Spouse  Functional History: Prior Function Level of Independence: Independent Comments: ADLs, IADLs, driving, and enjoys writing. has written many books. retired Immunologist Status:  Mobility: Bed Mobility Overal bed mobility: Needs Assistance Bed Mobility: Supine to Sit Supine to sit: Min guard General bed mobility comments: Min Guard for safety demonstating WFL strength Transfers Overall transfer level: Needs assistance Equipment used: Rolling walker (2 wheeled) Transfers: Sit to/from Stand Sit to Stand: Min assist, +2 safety/equipment General transfer comment: Min A to steady in static standing. +2 for safety. Noted left lateral lean. Mod A for dynamic balance Ambulation/Gait Ambulation/Gait assistance: Mod assist, +2 safety/equipment Ambulation Distance (Feet): 25 Feet Assistive device: Rolling walker (2 wheeled) Gait Pattern/deviations: Step-through pattern, Trunk flexed, Wide base of support, Antalgic General Gait Details: Antalgic with wide BOS and LLE external rotation. Pt taking very large steps and at times outside of the walker. Pt running into the walls and objects on the left side and required max cues to correct.  Gait velocity: Decreased Gait velocity interpretation: Below normal speed for age/gender    ADL: ADL Overall ADL's : Needs assistance/impaired Eating/Feeding: Set up, Sitting, Supervision/ safety Grooming: Set up,  Supervision/safety, Sitting Upper Body Bathing: Sitting, Min guard Lower Body Bathing: Sit to/from stand, Moderate assistance Upper Body Dressing : Min guard, Sitting, Cueing for sequencing Upper Body Dressing Details (indicate cue type and reason): Pt donning second gown like jacket. Pt demosntrating poor problem solving and requiring Mod VCs to sequence steps.  Lower Body Dressing: Sit to/from stand, Moderate assistance Lower Body Dressing Details (indicate cue type and reason): Pt donning socks at EOB with Min guard due to posterior lean. Pt bringing ankels to knees. Upon arrival, pt with left sock already on. Asked pt to put right sock on and he brought left foot up instead. Required increased time to realize he needed to bring up with right foot. Mod A for dyanmic standing balance Toilet Transfer: Moderate assistance, RW, Ambulation Functional mobility during ADLs: Moderate assistance, +2 for safety/equipment, Rolling walker General ADL Comments: Pt with decreased balance, coordination, cognition, and vision. Pt requiring Mod A for ADLs in standing due to decreased balance. Pt running into the wall, trash can, and chair during funcitonal mobility and required Max cues to change directions and avoid objects.  Cognition: Cognition Overall Cognitive Status: Impaired/Different from baseline Arousal/Alertness: Awake/alert Orientation Level: Disoriented to time, Oriented to person, Oriented to place, Oriented to situation Attention: Selective Selective Attention: Impaired Selective Attention Impairment: Verbal basic, Functional basic Memory: Impaired Memory Impairment: Storage deficit, Retrieval deficit Awareness: Impaired Awareness Impairment: Emergent impairment Problem Solving: Impaired Problem Solving Impairment: Verbal complex Executive Function: Sequencing Sequencing: Impaired Safety/Judgment: Impaired Cognition Arousal/Alertness: Awake/alert Behavior During  Therapy: Flat  affect Overall Cognitive Status: Impaired/Different from baseline Area of Impairment: Attention, Memory, Following commands, Safety/judgement, Awareness, Problem solving Current Attention Level: Sustained Memory: Decreased short-term memory Following Commands: Follows one step commands with increased time Safety/Judgement: Decreased awareness of safety, Decreased awareness of deficits Awareness: Emergent Problem Solving: Slow processing, Decreased initiation, Difficulty sequencing, Requires verbal cues, Requires tactile cues General Comments: Pt presenting with decreased processing and requiring increased time. Pt with decreased problem solving as seen by poor navigation around room. Noted left inattention. Pt walking straight into a wall and then required Max cues to turn away from wall to continue walking.  Blood pressure (!) 142/60, pulse 77, temperature 98.2 F (36.8 C), temperature source Oral, resp. rate 18, height 5' 10.5" (1.791 m), weight 108.4 kg (238 lb 15.7 oz), SpO2 98 %. Physical Exam  Nursing note and vitals reviewed. Constitutional: He is oriented to person, place, and time. He appears well-developed and well-nourished. No distress.  HENT:  Head: Normocephalic and atraumatic.  Eyes: Conjunctivae and EOM are normal. Pupils are equal, round, and reactive to light.  Neck: Normal range of motion. Neck supple.  Cardiovascular: Normal rate and regular rhythm.  Respiratory: Effort normal and breath sounds normal. No stridor. No respiratory distress.  GI: Soft. Bowel sounds are normal. He exhibits no distension. There is no tenderness.  Musculoskeletal: He exhibits no edema or tenderness.  Neurological: He is alert and oriented to person, place, and time. A cranial nerve deficit is present.  Speech clear.  Able to follow simple one and two step commands without difficulty.   Mild left inattention with lack of awareness of deficits.  Motor: RUE/RLE: 5/5 proximal to distal LUE:  4+/5 proximal to distal with mild dysmetria LLE: HF, KE 4-4+/5, ADF 3+/5  Skin: Skin is warm and dry. No rash noted. He is not diaphoretic. No erythema.  Psychiatric: He is agitated.    Results for orders placed or performed during the hospital encounter of 07/21/17 (from the past 24 hour(s))  Glucose, capillary     Status: None   Collection Time: 07/23/17  6:24 AM  Result Value Ref Range   Glucose-Capillary 84 65 - 99 mg/dL   Ct Angio Head W Or Wo Contrast  Result Date: 07/22/2017 CLINICAL DATA:  Stroke.  Diabetes and hyperlipidemia EXAM: CT ANGIOGRAPHY HEAD AND NECK TECHNIQUE: Multidetector CT imaging of the head and neck was performed using the standard protocol during bolus administration of intravenous contrast. Multiplanar CT image reconstructions and MIPs were obtained to evaluate the vascular anatomy. Carotid stenosis measurements (when applicable) are obtained utilizing NASCET criteria, using the distal internal carotid diameter as the denominator. CONTRAST:  42mL ISOVUE-370 IOPAMIDOL (ISOVUE-370) INJECTION 76% COMPARISON:  CT and MRI head 07/21/2017 FINDINGS: CT HEAD FINDINGS Brain: Ill-defined hypodensity right parietal lobe compatible with acute infarct, unchanged from prior studies. No acute hemorrhage identified. Chronic infarct right frontal lobe over the vertex. Chronic microvascular ischemic changes in the white matter. Negative for hemorrhage or mass. Negative for hydrocephalus. Vascular: Negative for hyperdense vessel Skull: Negative Sinuses: Negative Orbits: Negative Review of the MIP images confirms the above findings CTA NECK FINDINGS Aortic arch: Atherosclerotic disease in the aortic arch with moderate plaque present. No dissection or aneurysm. Atherosclerotic disease in the proximal great vessels without significant stenosis. Right carotid system: Mild atherosclerotic disease right common carotid artery. Extensive calcified plaque right carotid bifurcation narrowing the lumen to  2.4 mm, corresponding to 55% diameter stenosis. Tandem stenosis proximal right internal carotid artery  40% diameter stenosis. Severe stenosis origin of right external carotid artery Left carotid system: Mild atherosclerotic calcification left common carotid artery. Irregular calcified and noncalcified plaque proximal left internal carotid artery narrowing the lumen to 1.2 mm, 80% diameter stenosis. Vertebral arteries: Moderate stenosis origin of right vertebral artery. Mild atherosclerotic calcification mid right vertebral artery without additional stenosis Severe stenosis at the origin of the left vertebral artery. Mild scattered atherosclerotic disease throughout the left vertebral artery which is patent to the basilar. Skeleton: No acute skeletal abnormality.  Poor dentition. Other neck: Mild goiter with small 5 mm nodules on the left. Upper chest: Mild apical emphysema. Review of the MIP images confirms the above findings CTA HEAD FINDINGS Anterior circulation: Atherosclerotic calcification throughout the cavernous carotid bilaterally with mild to moderate stenosis bilaterally. Anterior cerebral arteries widely patent bilaterally. Mild stenosis distal left M1 segment. Right M1 segment mildly stenotic distally. Severe focal stenosis right M2 segment supplying the parietal lobe corresponding to acute infarction. Posterior circulation: Both vertebral arteries patent to the basilar. PICA patent bilaterally. Basilar widely patent. Superior cerebellar and posterior cerebral arteries patent bilaterally. Moderate stenosis in the left P2 segment. Venous sinuses: Patent Anatomic variants: None Delayed phase: Not performed Review of the MIP images confirms the above findings IMPRESSION: Calcified plaque right carotid bifurcation narrowing the internal carotid artery lumen by 55% diameter stenosis. Tandem 40% diameter stenosis proximal right internal carotid artery. Severe stenosis origin right external carotid artery.  Moderate stenosis right cavernous carotid. Irregular calcified and noncalcified plaque proximal left internal carotid artery with severe stenosis, 80% diameter stenosis. Moderate stenosis left cavernous carotid Severe stenosis origin of left vertebral artery and moderate stenosis origin of right vertebral artery Diffuse intracranial atherosclerotic disease as above. Severe stenosis right M2 segment supplying acute infarct in the right parietal lobe. Electronically Signed   By: Franchot Gallo M.D.   On: 07/22/2017 08:19   Ct Head Wo Contrast  Result Date: 07/22/2017 CLINICAL DATA:  Stroke.  Diabetes and hyperlipidemia EXAM: CT ANGIOGRAPHY HEAD AND NECK TECHNIQUE: Multidetector CT imaging of the head and neck was performed using the standard protocol during bolus administration of intravenous contrast. Multiplanar CT image reconstructions and MIPs were obtained to evaluate the vascular anatomy. Carotid stenosis measurements (when applicable) are obtained utilizing NASCET criteria, using the distal internal carotid diameter as the denominator. CONTRAST:  60mL ISOVUE-370 IOPAMIDOL (ISOVUE-370) INJECTION 76% COMPARISON:  CT and MRI head 07/21/2017 FINDINGS: CT HEAD FINDINGS Brain: Ill-defined hypodensity right parietal lobe compatible with acute infarct, unchanged from prior studies. No acute hemorrhage identified. Chronic infarct right frontal lobe over the vertex. Chronic microvascular ischemic changes in the white matter. Negative for hemorrhage or mass. Negative for hydrocephalus. Vascular: Negative for hyperdense vessel Skull: Negative Sinuses: Negative Orbits: Negative Review of the MIP images confirms the above findings CTA NECK FINDINGS Aortic arch: Atherosclerotic disease in the aortic arch with moderate plaque present. No dissection or aneurysm. Atherosclerotic disease in the proximal great vessels without significant stenosis. Right carotid system: Mild atherosclerotic disease right common carotid artery.  Extensive calcified plaque right carotid bifurcation narrowing the lumen to 2.4 mm, corresponding to 55% diameter stenosis. Tandem stenosis proximal right internal carotid artery 40% diameter stenosis. Severe stenosis origin of right external carotid artery Left carotid system: Mild atherosclerotic calcification left common carotid artery. Irregular calcified and noncalcified plaque proximal left internal carotid artery narrowing the lumen to 1.2 mm, 80% diameter stenosis. Vertebral arteries: Moderate stenosis origin of right vertebral artery. Mild atherosclerotic calcification mid right  vertebral artery without additional stenosis Severe stenosis at the origin of the left vertebral artery. Mild scattered atherosclerotic disease throughout the left vertebral artery which is patent to the basilar. Skeleton: No acute skeletal abnormality.  Poor dentition. Other neck: Mild goiter with small 5 mm nodules on the left. Upper chest: Mild apical emphysema. Review of the MIP images confirms the above findings CTA HEAD FINDINGS Anterior circulation: Atherosclerotic calcification throughout the cavernous carotid bilaterally with mild to moderate stenosis bilaterally. Anterior cerebral arteries widely patent bilaterally. Mild stenosis distal left M1 segment. Right M1 segment mildly stenotic distally. Severe focal stenosis right M2 segment supplying the parietal lobe corresponding to acute infarction. Posterior circulation: Both vertebral arteries patent to the basilar. PICA patent bilaterally. Basilar widely patent. Superior cerebellar and posterior cerebral arteries patent bilaterally. Moderate stenosis in the left P2 segment. Venous sinuses: Patent Anatomic variants: None Delayed phase: Not performed Review of the MIP images confirms the above findings IMPRESSION: Calcified plaque right carotid bifurcation narrowing the internal carotid artery lumen by 55% diameter stenosis. Tandem 40% diameter stenosis proximal right  internal carotid artery. Severe stenosis origin right external carotid artery. Moderate stenosis right cavernous carotid. Irregular calcified and noncalcified plaque proximal left internal carotid artery with severe stenosis, 80% diameter stenosis. Moderate stenosis left cavernous carotid Severe stenosis origin of left vertebral artery and moderate stenosis origin of right vertebral artery Diffuse intracranial atherosclerotic disease as above. Severe stenosis right M2 segment supplying acute infarct in the right parietal lobe. Electronically Signed   By: Franchot Gallo M.D.   On: 07/22/2017 08:19   Ct Angio Neck W Or Wo Contrast  Result Date: 07/22/2017 CLINICAL DATA:  Stroke.  Diabetes and hyperlipidemia EXAM: CT ANGIOGRAPHY HEAD AND NECK TECHNIQUE: Multidetector CT imaging of the head and neck was performed using the standard protocol during bolus administration of intravenous contrast. Multiplanar CT image reconstructions and MIPs were obtained to evaluate the vascular anatomy. Carotid stenosis measurements (when applicable) are obtained utilizing NASCET criteria, using the distal internal carotid diameter as the denominator. CONTRAST:  55mL ISOVUE-370 IOPAMIDOL (ISOVUE-370) INJECTION 76% COMPARISON:  CT and MRI head 07/21/2017 FINDINGS: CT HEAD FINDINGS Brain: Ill-defined hypodensity right parietal lobe compatible with acute infarct, unchanged from prior studies. No acute hemorrhage identified. Chronic infarct right frontal lobe over the vertex. Chronic microvascular ischemic changes in the white matter. Negative for hemorrhage or mass. Negative for hydrocephalus. Vascular: Negative for hyperdense vessel Skull: Negative Sinuses: Negative Orbits: Negative Review of the MIP images confirms the above findings CTA NECK FINDINGS Aortic arch: Atherosclerotic disease in the aortic arch with moderate plaque present. No dissection or aneurysm. Atherosclerotic disease in the proximal great vessels without significant  stenosis. Right carotid system: Mild atherosclerotic disease right common carotid artery. Extensive calcified plaque right carotid bifurcation narrowing the lumen to 2.4 mm, corresponding to 55% diameter stenosis. Tandem stenosis proximal right internal carotid artery 40% diameter stenosis. Severe stenosis origin of right external carotid artery Left carotid system: Mild atherosclerotic calcification left common carotid artery. Irregular calcified and noncalcified plaque proximal left internal carotid artery narrowing the lumen to 1.2 mm, 80% diameter stenosis. Vertebral arteries: Moderate stenosis origin of right vertebral artery. Mild atherosclerotic calcification mid right vertebral artery without additional stenosis Severe stenosis at the origin of the left vertebral artery. Mild scattered atherosclerotic disease throughout the left vertebral artery which is patent to the basilar. Skeleton: No acute skeletal abnormality.  Poor dentition. Other neck: Mild goiter with small 5 mm nodules on the left. Upper  chest: Mild apical emphysema. Review of the MIP images confirms the above findings CTA HEAD FINDINGS Anterior circulation: Atherosclerotic calcification throughout the cavernous carotid bilaterally with mild to moderate stenosis bilaterally. Anterior cerebral arteries widely patent bilaterally. Mild stenosis distal left M1 segment. Right M1 segment mildly stenotic distally. Severe focal stenosis right M2 segment supplying the parietal lobe corresponding to acute infarction. Posterior circulation: Both vertebral arteries patent to the basilar. PICA patent bilaterally. Basilar widely patent. Superior cerebellar and posterior cerebral arteries patent bilaterally. Moderate stenosis in the left P2 segment. Venous sinuses: Patent Anatomic variants: None Delayed phase: Not performed Review of the MIP images confirms the above findings IMPRESSION: Calcified plaque right carotid bifurcation narrowing the internal carotid  artery lumen by 55% diameter stenosis. Tandem 40% diameter stenosis proximal right internal carotid artery. Severe stenosis origin right external carotid artery. Moderate stenosis right cavernous carotid. Irregular calcified and noncalcified plaque proximal left internal carotid artery with severe stenosis, 80% diameter stenosis. Moderate stenosis left cavernous carotid Severe stenosis origin of left vertebral artery and moderate stenosis origin of right vertebral artery Diffuse intracranial atherosclerotic disease as above. Severe stenosis right M2 segment supplying acute infarct in the right parietal lobe. Electronically Signed   By: Franchot Gallo M.D.   On: 07/22/2017 08:19   Mr Brain Wo Contrast  Result Date: 07/21/2017 CLINICAL DATA:  CTA.  Loss of balance.  Fall. EXAM: MRI HEAD WITHOUT CONTRAST TECHNIQUE: Multiplanar, multiecho pulse sequences of the brain and surrounding structures were obtained without intravenous contrast. COMPARISON:  Head CT 07/21/2017 FINDINGS: Brain: The midline structures are normal. Large area of abnormal diffusion restriction within the posterior right MCA territory. There is a small area of diffusion restriction within the right PCA territory. Multiple old cerebellar infarcts and basal ganglia lacunar infarcts. No mass lesion, hydrocephalus, dural abnormality or extra-axial collection. Early confluent hyperintense T2-weighted signal of the periventricular and deep white matter, most commonly due to chronic ischemic microangiopathy. There is edema within the posterior right hemisphere in the distribution of diffusion abnormality. No age-advanced or lobar predominant atrophy. There is petechial hemorrhage within the posterior right MCA territory, but no space-occupying hematoma. Vascular: Major intracranial arterial and venous sinus flow voids are preserved. Skull and upper cervical spine: The visualized skull base, calvarium, upper cervical spine and extracranial soft tissues  are normal. Sinuses/Orbits: No fluid levels or advanced mucosal thickening. No mastoid or middle ear effusion. Normal orbits. IMPRESSION: 1. Large area of acute ischemia within the posterior right hemisphere, involving the posterior right MCA territory and a small portion of the right PCA territory. Involvement of 2 vascular territories may indicate an embolic source. 2. Petechial hemorrhage in the posterior right MCA distribution, but no space-occupying hematoma. No midline shift or other mass effect. 3. Old basal ganglia and cerebellar lacunar infarcts superimposed on chronic microvascular ischemia. Electronically Signed   By: Ulyses Jarred M.D.   On: 07/21/2017 22:59    Assessment/Plan: Diagnosis: right MCA and small portion right PCA infarct Labs and images independently reviewed.  Records reviewed and summated above. Stroke: Continue secondary stroke prophylaxis and Risk Factor Modification listed below:   Antiplatelet therapy:   Blood Pressure Management:  Continue current medication with prn's with permisive HTN per primary team Statin Agent:   Diabetes management:   Left sided hemiparesis Motor recovery: Fluoxetine  1. Does the need for close, 24 hr/day medical supervision in concert with the patient's rehab needs make it unreasonable for this patient to be served in a less intensive  setting? Yes  2. Co-Morbidities requiring supervision/potential complications: diastolic dysfunction (monitor for signs/symptoms of fluid overload), type 2 diabetes mellitus (Monitor in accordance with exercise and adjust meds as necessary), hyperlipidemia (cont meds), CKD (avoid nephrotoxic meds), ABLA (transfuse if necessary to ensure appropriate perfusion for increased activity tolerance), HTN (monitor and provide prns in accordance with increased physical exertion and pain) 3. Due to safety, disease management and patient education, does the patient require 24 hr/day rehab nursing? Yes 4. Does the patient  require coordinated care of a physician, rehab nurse, PT (1-2 hrs/day, 5 days/week) and OT (1-2 hrs/day, 5 days/week) to address physical and functional deficits in the context of the above medical diagnosis(es)? Yes Addressing deficits in the following areas: balance, endurance, locomotion, strength, transferring, bathing, dressing, toileting and psychosocial support 5. Can the patient actively participate in an intensive therapy program of at least 3 hrs of therapy per day at least 5 days per week? Yes 6. The potential for patient to make measurable gains while on inpatient rehab is excellent 7. Anticipated functional outcomes upon discharge from inpatient rehab are supervision  with PT, supervision with OT, n/a with SLP. 8. Estimated rehab length of stay to reach the above functional goals is: 10-14 days. 9. Anticipated D/C setting: Home 10. Anticipated post D/C treatments: HH therapy and Home excercise program 11. Overall Rehab/Functional Prognosis: good  RECOMMENDATIONS: This patient's condition is appropriate for continued rehabilitative care in the following setting: CIR Patient has agreed to participate in recommended program. Yes Note that insurance prior authorization may be required for reimbursement for recommended care.  Comment: Rehab Admissions Coordinator to follow up.  Delice Lesch, MD, ABPMR Bary Leriche, PA-C 07/23/2017

## 2017-07-23 NOTE — Evaluation (Signed)
Occupational Therapy Evaluation Patient Details Name: Eric Long MRN: [id number] DOB: [birth year] Today's Date: 07/23/2017    History of Present Illness 77 y.o.male with a history of diabetes, hyperlipidemiawho presents with unsteady gait. MRI: Large area of acute ischemia within the posterior right hemisphere, involving the posterior right MCA territory and a small portion of the right PCA territory.   Clinical Impression   PTA, pt was living with his wife and was independent with ADLs, IADLs, driving, and is a retired Chief Financial Officer.  Pt currently requiring Min Guard A for UB ADLs, Mod A for LB ADLs, and Mod A for functional mobility with RW and +2 for safety. Pt presenting with decreased balance, poor coordination in LUE/LLE, left visual field deficits and inattention, and decreased cognition as seen by poor problem solving, attention, and processing. Pt highly motivated to return to PLOF and participate in therapy and has good family support. Pt will require further acute OT to facilitate safe dc. Recommend dc to CIR for further intensive OT to optimize safety and independence with ADLs, IADLs, and funcitonal mobility as well as return to PLOF.      Follow Up Recommendations  CIR;Supervision/Assistance - 24 hour    Equipment Recommendations  Other (comment)(Defer to next venue)    Recommendations for Other Services Rehab consult;PT consult;Speech consult     Precautions / Restrictions Precautions Precautions: Fall;Other (comment) Precaution Comments: Left visual deficits Restrictions Weight Bearing Restrictions: No      Mobility Bed Mobility Overal bed mobility: Needs Assistance Bed Mobility: Supine to Sit     Supine to sit: Min guard     General bed mobility comments: Min Guard for safety demonstating WFL strength  Transfers Overall transfer level: Needs assistance Equipment used: Rolling walker (2 wheeled) Transfers: Sit to/from Stand Sit to Stand: Min assist;+2  safety/equipment         General transfer comment: Min A to steady in static standing. +2 for safety. Noted left lateral lean. Mod A for dynamic balance    Balance Overall balance assessment: Needs assistance Sitting-balance support: No upper extremity supported;Feet supported Sitting balance-Leahy Scale: Fair Sitting balance - Comments: Able to maintain static sitting. posterior lean noted with dyanmic movement Postural control: Posterior lean Standing balance support: Bilateral upper extremity supported;During functional activity Standing balance-Leahy Scale: Poor Standing balance comment: Reliant on UE support and physical A                            ADL either performed or assessed with clinical judgement   ADL Overall ADL's : Needs assistance/impaired Eating/Feeding: Set up;Sitting;Supervision/ safety   Grooming: Set up;Supervision/safety;Sitting   Upper Body Bathing: Sitting;Min guard   Lower Body Bathing: Sit to/from stand;Moderate assistance   Upper Body Dressing : Min guard;Sitting;Cueing for sequencing Upper Body Dressing Details (indicate cue type and reason): Pt donning second gown like jacket. Pt demosntrating poor problem solving and requiring Mod VCs to sequence steps.  Lower Body Dressing: Sit to/from stand;Moderate assistance Lower Body Dressing Details (indicate cue type and reason): Pt donning socks at EOB with Min guard due to posterior lean. Pt bringing ankels to knees. Upon arrival, pt with left sock already on. Asked pt to put right sock on and he brought left foot up instead. Required increased time to realize he needed to bring up with right foot. Mod A for dyanmic standing balance Toilet Transfer: Moderate assistance;RW;Ambulation           Functional  mobility during ADLs: Moderate assistance;+2 for safety/equipment;Rolling walker General ADL Comments: Pt with decreased balance, coordination, cognition, and vision. Pt requiring Mod A for  ADLs in standing due to decreased balance. Pt running into the wall, trash can, and chair during funcitonal mobility and required Max cues to change directions and avoid objects.     Vision Baseline Vision/History: Wears glasses Wears Glasses: At all times Patient Visual Report: No change from baseline Vision Assessment?: Yes;Vision impaired- to be further tested in functional context Eye Alignment: Impaired (comment)(Not aligned during convergence) Tracking/Visual Pursuits: Decreased smoothness of eye movement to LEFT superior field;Decreased smoothness of eye movement to RIGHT inferior field;Decreased smoothness of horizontal tracking;Decreased smoothness of vertical tracking;Requires cues, head turns, or add eye shifts to track Saccades: Decreased speed of saccadic movement;Impaired - to be further tested in functional context(Poor saccadic movement to left) Convergence: Impaired (comment)(L eye gazing inward while R eye maintains at midline) Visual Fields: Left visual field deficit Depth Perception: Overshoots;Undershoots Additional Comments: During visual field testing, pt WFL for see objects coming from right. From left, pt not seeing objects will near midline. Pt with decreased smooth tracking and delay to left side. Pt denies any blurry vision or dipliopia. Pt presenting with left inattention during funcitonal mobility and walking straight into wall, trash can, and chair. Pt able to gaze to left and turn his head during conversation.      Perception     Praxis      Pertinent Vitals/Pain Pain Assessment: No/denies pain     Hand Dominance Right   Extremity/Trunk Assessment Upper Extremity Assessment Upper Extremity Assessment: LUE deficits/detail LUE Deficits / Details: WFL strength. Decreased finger opposition and coordination as seen during ADLs and testing. Pt with undershoot and overshooting.  LUE Sensation: WNL(Reporting no sensation deficits) LUE Coordination: decreased  fine motor;decreased gross motor   Lower Extremity Assessment Lower Extremity Assessment: Defer to PT evaluation   Cervical / Trunk Assessment Cervical / Trunk Assessment: Other exceptions Cervical / Trunk Exceptions: Increased body habitus   Communication Communication Communication: Expressive difficulties   Cognition Arousal/Alertness: Awake/alert Behavior During Therapy: Flat affect Overall Cognitive Status: Impaired/Different from baseline Area of Impairment: Attention;Memory;Following commands;Safety/judgement;Awareness;Problem solving                   Current Attention Level: Sustained Memory: Decreased short-term memory Following Commands: Follows one step commands with increased time Safety/Judgement: Decreased awareness of safety;Decreased awareness of deficits Awareness: Emergent Problem Solving: Slow processing;Decreased initiation;Difficulty sequencing;Requires verbal cues;Requires tactile cues General Comments: Pt presenting with decreased processing and requiring increased time. Pt with decreased problem solving as seen by poor navigation around room. Noted left inattention. Pt walking straight into a wall and then required Max cues to turn away from wall to continue walking.   General Comments  son present throughout session. Daughter present at 68 and end of session    Exercises     Shoulder Instructions      Home Living Family/patient expects to be discharged to:: Private residence Living Arrangements: Spouse/significant other Available Help at Discharge: Family;Available 24 hours/day Type of Home: House Home Access: Stairs to enter [employer] of Steps: 2 Entrance Stairs-Rails: None Home Layout: Two level;Able to live on main level with bedroom/bathroom;Laundry or work area in Building surveyor of Steps: flight   Bathroom Shower/Tub: Occupational psychologist: Standard(Comfort height) Bathroom  Accessibility: Yes How Accessible: Accessible via walker Home Equipment: Kasandra Knudsen - single point  Prior Functioning/Environment Level of Independence: Independent        Comments: ADLs, IADLs, driving, and enjoys writing. 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         OT Problem List: Decreased range of motion;Decreased activity tolerance;Impaired balance (sitting and/or standing);Impaired vision/perception;Decreased coordination;Decreased cognition;Decreased safety awareness;Decreased knowledge of use of DME or AE;Decreased knowledge of precautions;Impaired UE functional use      OT Treatment/Interventions: Self-care/ADL training;Therapeutic exercise;Energy conservation;DME and/or AE instruction;Therapeutic activities;Patient/family education;Visual/perceptual remediation/compensation;Balance training;Cognitive remediation/compensation    OT Goals(Current goals can be found in the care plan section) Acute Rehab OT Goals Patient Stated Goal: Go home and return to PLOF OT Goal Formulation: With patient Time For Goal Achievement: 08/06/17 Potential to Achieve Goals: Good ADL Goals Pt Will Perform Grooming: with set-up;with supervision;standing Pt Will Perform Upper Body Dressing: with modified independence;sitting Pt Will Perform Lower Body Dressing: with set-up;with supervision;sit to/from stand Pt Will Transfer to Toilet: with set-up;with supervision;ambulating;regular height toilet Pt Will Perform Toileting - Clothing Manipulation and hygiene: with supervision;sit to/from stand;with set-up Additional ADL Goal #1: Pt will attend to 75% of objects in left visual field during ADLs  OT Frequency: Min 3X/week   Barriers to D/C:            Co-evaluation PT/OT/SLP Co-Evaluation/Treatment: Yes Reason for Co-Treatment: Complexity of the patient's impairments (multi-system involvement);To address functional/ADL transfers   OT goals addressed during session: ADL's and  self-care      AM-PAC PT "6 Clicks" Daily Activity     Outcome Measure Help from another person eating meals?: None Help from another person taking care of personal grooming?: A Little Help from another person toileting, which includes using toliet, bedpan, or urinal?: A Lot Help from another person bathing (including washing, rinsing, drying)?: A Lot Help from another person to put on and taking off regular upper body clothing?: A Little Help from another person to put on and taking off regular lower body clothing?: A Lot 6 Click Score: 16   End of Session Equipment Utilized During Treatment: Gait belt;Rolling walker Nurse Communication: Mobility status  Activity Tolerance: Patient tolerated treatment well Patient left: in chair;with call bell/phone within reach;with chair alarm set;with family/visitor present  OT Visit Diagnosis: Unsteadiness on feet (R26.81);Other abnormalities of gait and mobility (R26.89);Muscle weakness (generalized) (M62.81);Other symptoms and signs involving cognitive function;Hemiplegia and hemiparesis;Low vision, both eyes (H54.2);Ataxia, unspecified (R27.0) Hemiplegia - Right/Left: Left Hemiplegia - dominant/non-dominant: Non-Dominant Hemiplegia - caused by: Cerebral infarction                Time: 4825-0037 OT Time Calculation (min): 36 min Charges:  OT General Charges $OT Visit: 1 Visit OT Evaluation $OT Eval Moderate Complexity: 1 Mod G-Codes:     [place] MSOT, OTR/L Acute Rehab Pager: [phone] Office: [place] 07/23/2017, 2:33 PM

## 2017-07-23 NOTE — Evaluation (Signed)
Speech Language Pathology Evaluation Patient Details Name: Eric Long MRN: [id number] DOB: 04/17/1941 Today's Date: 07/23/2017 Time: 1224-8250 SLP Time Calculation (min) (ACUTE ONLY): 50 min  Problem List:  Patient Active Problem List   Diagnosis Date Noted  . Essential hypertension 07/22/2017  . Type 2 diabetes mellitus with renal manifestations ([place]) 07/22/2017  . Acute encephalopathy   . Acute CVA (cerebrovascular accident) ([place]) 07/21/2017  . Chronic kidney disease, stage III (moderate) ([place]) 02/19/2013  . Atherosclerotic peripheral vascular disease ([place]) 02/19/2013  . Pure hypercholesterolemia 02/19/2013  . Diabetes mellitus, stable ([place]) 02/05/2013   Past Medical History:  Past Medical History:  Diagnosis Date  . Diabetes mellitus without complication ([place])   . Hyperlipidemia    Past Surgical History:  Past Surgical History:  Procedure Laterality Date  . 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 MRI: Large area of acute ischemia within the posterior right hemisphere, involving the posterior right MCA territory and a small portion of the right PCA territory.   Assessment / Plan / Recommendation Clinical Impression  Pt achieved 20/30 per MOCA, with strengths in areas of language and abstraction, deficits specific to selective and alternating attention, visual-spatial construction, and lack of insight/awareness.  Pt very motivated for rehabilitation and has excellent family support.  PT/OT recommendations pending.     SLP Assessment  SLP Recommendation/Assessment: Patient needs continued Speech Lanaguage Pathology Services SLP Visit Diagnosis: Cognitive communication deficit (R41.841)    Follow Up Recommendations  Inpatient Rehab    Frequency and Duration min 2x/week  1 week      SLP Evaluation Cognition  Overall  Cognitive Status: Impaired/Different from baseline Arousal/Alertness: Awake/alert Orientation Level: Disoriented to time;Oriented to person;Oriented to place;Oriented to situation Attention: Selective Selective Attention: Impaired Selective Attention Impairment: Verbal basic;Functional basic Memory: Impaired Memory Impairment: Storage deficit;Retrieval deficit Awareness: Impaired Awareness Impairment: Emergent impairment Problem Solving: Impaired Problem Solving Impairment: Verbal complex Executive Function: Sequencing Sequencing: Impaired Safety/Judgment: Impaired       Comprehension  Auditory Comprehension Overall Auditory Comprehension: Appears within functional limits for tasks assessed Reading Comprehension Reading Status: Not tested    Expression Expression Primary Mode of Expression: Verbal Verbal Expression Overall Verbal Expression: Appears within functional limits for tasks assessed Written Expression Dominant Hand: Right Written Expression: Not tested   Oral / Motor  Oral Motor/Sensory Function Overall Oral Motor/Sensory Function: Mild impairment Facial ROM: Reduced right Facial Symmetry: Abnormal symmetry left Motor Speech Overall Motor Speech: Appears within functional limits for tasks assessed   GO                    Eric Long Eric Long 07/23/2017, 11:58 AM

## 2017-07-23 NOTE — Research (Signed)
[id number] Informed Consent   Subject Name: Eric Long  Subject met inclusion and exclusion criteria.  The informed consent form, study requirements and expectations were reviewed with the subject and questions and concerns were addressed prior to the signing of the consent form.  The subject verbalized understanding of the trail requirements.  The subject agreed to participate in the STROKE-AF trial and signed the informed consent.  The informed consent was obtained prior to performance of any protocol-specific procedures for the subject.  A copy of the signed informed consent was given to the subject and a copy was placed in the subject's medical record.  Hedrick,Tammy W 07/23/2017, 15:30

## 2017-07-23 NOTE — Progress Notes (Signed)
STROKE TEAM PROGRESS NOTE   HISTORY OF PRESENT ILLNESS (per record) Eric Long is a 77 y.o. male with a history of diabetes, hyperlipidemia who presents with unsteady gait that started on awakening Saturday morning.  He states that he is not certain why he has been unsteady, but just has not felt like he was walking normally.  He therefore presented to the emergency department today.  He denies left-sided weakness, left-sided discoordination, but he has noticed some possible difficulties with his vision, though the exact character of the problems seem to elude him.   LKW: Friday prior to bed tpa given?: no, outside of window   SUBJECTIVE (INTERVAL HISTORY) The patient's wife and son were in the room.  The patient  Is awake and interactive. He has very minimum left-sided weakness and peripheral vision loss and inattention   OBJECTIVE Temp:  [97.9 F (36.6 C)-98.3 F (36.8 C)] 98.2 F (36.8 C) (03/20 1148) Pulse Rate:  [77-90] 77 (03/20 1148) Cardiac Rhythm: Normal sinus rhythm (03/20 0749) Resp:  [18-19] 18 (03/20 1148) BP: (133-153)/(55-65) 142/60 (03/20 1148) SpO2:  [94 %-99 %] 98 % (03/20 1148)  CBC:  Recent Labs  Lab 07/21/17 1740 07/21/17 1745 07/22/17 0220  WBC 7.7  --  7.0  NEUTROABS 4.6  --   --   HGB 12.7* 12.9* 12.2*  HCT 38.6* 38.0* 37.5*  MCV 93.0  --  92.8  PLT 246  --  333    Basic Metabolic Panel:  Recent Labs  Lab 07/21/17 1740 07/21/17 1745 07/22/17 0220  NA 137 139 137  K 4.1 4.1 3.7  CL 101 103 102  CO2 26  --  26  GLUCOSE 128* 125* 114*  BUN 13 17 15   CREATININE 1.41* 1.40* 1.30*  1.34*  CALCIUM 9.2  --  9.1    Lipid Panel:     Component Value Date/Time   CHOL 274 (H) 07/22/2017 0220   TRIG 218 (H) 07/22/2017 0220   HDL 30 (L) 07/22/2017 0220   CHOLHDL 9.1 07/22/2017 0220   VLDL 44 (H) 07/22/2017 0220   LDLCALC 200 (H) 07/22/2017 0220   HgbA1c:  Lab Results  Component Value Date   HGBA1C 7.6 (H) 07/22/2017   Urine Drug  Screen: No results found for: LABOPIA, COCAINSCRNUR, LABBENZ, AMPHETMU, THCU, LABBARB  Alcohol Level No results found for: ETH  IMAGING  Ct Head Wo Contrast Ct Angio Head W Or Wo Contrast Ct Angio Neck W Or Wo Contrast 07/22/2017 IMPRESSION:  Calcified plaque right carotid bifurcation narrowing the internal carotid artery lumen by 55% diameter stenosis.  Tandem 40% diameter stenosis proximal right internal carotid artery.  Severe stenosis origin right external carotid artery.  Moderate stenosis right cavernous carotid. Irregular calcified and noncalcified plaque proximal left internal carotid artery with severe stenosis, 80% diameter stenosis.  Moderate stenosis left cavernous carotid  Severe stenosis origin of left vertebral artery and  moderate stenosis origin of right vertebral artery  Diffuse intracranial atherosclerotic disease as above.  Severe stenosis right M2 segment supplying acute infarct in the right parietal lobe.    Ct Head Wo Contrast 07/21/2017 IMPRESSION:  1. Hypodensity within the right parietal and temporal lobes consistent with acute to subacute infarct. No hemorrhage or significant midline shift at this time. MRI recommended for further evaluation  2. Mild small vessel ischemic changes of the white matter with atrophy.  Old small infarct in the right frontal lobe.    Mr Brain Wo Contrast 07/21/2017 IMPRESSION:  1.  Large area of acute ischemia within the posterior right hemisphere, involving the posterior right MCA territory and a small portion of the right PCA territory. Involvement of 2 vascular territories may indicate an embolic source.  2. Petechial hemorrhage in the posterior right MCA distribution, but no space-occupying hematoma. No midline shift or other mass effect.  3. Old basal ganglia and cerebellar lacunar infarcts superimposed on chronic microvascular ischemia.     Transthoracic Echocardiogram -  Left ventricle: The cavity size was normal.  Systolic function was normal. The estimated ejection fraction was in the range of 60% to 65%. Wall motion was normal; there were no regional wall motion abnormalities 07/22/2017     PHYSICAL EXAM Vitals:   07/23/17 0050 07/23/17 0521 07/23/17 0900 07/23/17 1148  BP: (!) 150/57 (!) 153/65 (!) 142/55 (!) 142/60  Pulse: 85 82 85 77  Resp: 19 18 18 18   Temp: 98.3 F (36.8 C) 98 F (36.7 C) 97.9 F (36.6 C) 98.2 F (36.8 C)  TempSrc: Oral Oral Oral Oral  SpO2: 96% 94% 96% 98%  Weight:      Height:      obese elderly male who is sedated but not in distress. . Afebrile. Head is nontraumatic. Neck is supple without bruit.    Cardiac exam no murmur or gallop. Lungs are clear to auscultation. Distal pulses are well felt. Neurological Exam ;  Patient is awake alert oriented 3. Speech is normal without aphasia or dysarthria. Extraocular moments are full range without nystagmus but he has saccadic dysmetria on left gaze. He has partial left peripheral vision loss. Mild inattention on the left. Face is symmetric without weakness tongue is midline. Motor system exam reveals no upper or lower eczema to drift but there is mild weakness of left grip and intrinsic hand muscles. Orbits right over left approximately. Minimum weakness of left hip flexors.. Deep tendon reflexes symmetric. Plantars are downgoing. Gait not tested.   ASSESSMENT/PLAN Mr. Eric Long is a 77 y.o. male with history of hyperlipidemia, previous strokes and diabetes mellitus presenting with unsteady gait. He did not receive IV t-PA due to late presentation.  Stroke: Right MCA territory infarcts -likely from intracranial and extracranial large vessel  atherosclerosis.  Resultant  Left hemiplegia  CT head - Hypodensity Rt parietal and temporal lobes c/w acute to subacute infarcts.  MRI head - Large area of acute ischemia within the posterior right hemisphere, involving the posterior right MCA territory and a small portion of  the right PCA territory. Etiology embolic source unknown  MRA head - not performed  CTA H&N -severe stenosis Rt ext carotid artery, Lt vertebral artery, and Rt M2.  Carotid Doppler - CTA neck 2D Echo - Left ventricle: The cavity size was normal. Systolic function was normal. The estimated ejection fraction was in the range of 60% to 65%. Wall motion was normal; there were no regional wall motion abnormalities  LDL 200  HgbA1c - 7.6  VTE prophylaxis - Lovenox Fall precautions Diet Heart Room service appropriate? Yes; Fluid consistency: Thin  No antithrombotic prior to admission, now on aspirin 325 mg daily  Patient counseled to be compliant with his antithrombotic medications  Ongoing aggressive stroke risk factor management  Therapy recommendations: CLR Disposition:  Inpatient rehab Hypertension  Stable  Permissive hypertension (OK if < 220/120) but gradually normalize in 5-7 days  Long-term BP goal normotensive  Hyperlipidemia  Lipid lowering medication PTA: Crestor 40 mg daily and Zetia 10 mg daily  LDL 200, goal <  70  Current lipid lowering medication: Crestor 40 mg daily and Zetia 10 mg daily  Verify patient compliance with medication.  Continue statin at discharge  Diabetes  HgbA1c 7.6, goal < 7.0  Uncontrolled  Other Stroke Risk Factors  Advanced age  Former cigarette smoker - quit  Obesity, Body mass index is 33.81 kg/m., recommend weight loss, diet and exercise as appropriate   Old basal ganglia and cerebellar lacunar infarcts by MRI   Other Active Problems  Mild anemia  Elevated creatinine  Severe intracranial and extracranial atherosclerosis as documented above on CT angiogram.  Petechial hemorrhage in the posterior right MCA distribution   Plan / Recommendations   Stroke workup - await  2D echo.  Add Plavix    Hospital day # 2    I have personally examined this patient, reviewed notes, independently viewed imaging studies,  participated in medical decision making and plan of care.ROS completed by me personally and pertinent positives fully documented  I have made any additions or clarifications directly to the above note.  ehe has presented with a right MCA infarct likely secondary to intracranial or extracranial large vessel atherosclerosis. Recommend dual antiplatelet therapy for 3 months and aggressive risk factor modification. physical occupational therapy and rehabilitation consults. Discussed with Dr Sarajane Jews.and patient's family at the bedside Greater than 50% time during this 25 minute visit was spent on counseling and coordination of care about his stroke and answered questions. He is interested in participation in the stroke atrial fibrillation trial. He will be given information to review and decide. No study specific procedure is completed before patient signed informed consent Antony Contras, MD Medical Director [facility] Pager: [phone] 07/23/2017 2:17 PM   To contact Stroke Continuity provider, please refer to [email or web address] After hours, contact General Neurology

## 2017-07-24 ENCOUNTER — Inpatient Hospital Stay (HOSPITAL_COMMUNITY): Payer: Medicare HMO

## 2017-07-24 DIAGNOSIS — I639 Cerebral infarction, unspecified: Secondary | ICD-10-CM

## 2017-07-24 LAB — GLUCOSE, CAPILLARY
GLUCOSE-CAPILLARY: 83 mg/dL (ref 65–99)
Glucose-Capillary: 104 mg/dL — ABNORMAL HIGH (ref 65–99)
Glucose-Capillary: 163 mg/dL — ABNORMAL HIGH (ref 65–99)
Glucose-Capillary: 86 mg/dL (ref 65–99)

## 2017-07-24 NOTE — Progress Notes (Addendum)
Rehab admissions - I have called [employer] and have opened the case requesting acute inpatient rehab admission.  I have faxed clinicals for review.  I will follow up with patient today to discuss rehab options.  Call me for questions.  #[phone]  I met with patient and his son at the bedside.  I gave them rehab booklets.  They would like in house rehab.  I will follow up once I hear back from insurance carrier.  #[phone]

## 2017-07-24 NOTE — Progress Notes (Signed)
PROGRESS NOTE  Eric Long NKN:[id number] DOB: [birth year] DOA: 07/21/2017 PCP: Lawerance Cruel, MD  Brief Narrative: 77 year old man who was noted to have some confusion as an outpatient as well as unsteady gait and therefore was taken to his primary care physician 3/18, CT head was obtained as an outpatient which was concerning for stroke and so the patient was sent to the emergency department. Initial examination in the emergency department showed some speech delay, dissymmetry and ataxia of the left arm and hesitancy following commands.  MRI revealed large right-sided stroke involving MCA and portions of PCA territory.  Admitted for neurology evaluation.  Initial recommendation was for stroke workup.  Subsequently the patient had a mental status change, became agitated, then nonverbal.  Assessment/Plan Right posterior MCA infarct.  No TPA secondary to late presentation.  Thought secondary to intracranial or extracranial large vessel atherosclerosis neurology evaluated the patient recommended further investigation.  EEG was normal.  CT angiogram showed moderate right middle cerebral artery stenosis with stable right MCA branch infarct without hemorrhagic transformation.  Also seen moderate proximal right common carotid artery and severe left internal carotid and vertebral artery stenosis.  LDL 200.  Hemoglobin A1c 7.6.  Carotid ultrasound: right ICA 40-59% stenosis.  ECA greater than 50% stenosis.  Left ICA 80-99% stenosis. --Remained stable.  Continue dual antiplatelet therapy for 3 months and aggressive risk factor modification.  Follow-up with outpatient stroke clinic in 6 weeks. --Pursuing inpatient rehab  Asymptomatic severe left extracranial carotid stenosis. --Outpatient referral to vascular surgery to consider revascularization.  Acute encephalopathy --Secondary to acute stroke.  Resolved.  Diabetes mellitus type 2 --Remained stable.  Essential hypertension --Continue  permissive hypertension in the short-term.  Long-term goal for normotension.  Hyperlipidemia, LDL 200.  Goal less than 70. --Continue Crestor, Zetia  Chronic kidney disease stage III --Appears to be at baseline.  Normocytic anemia --Stable.  Peripheral vascular disease  --Continue aspirin and statin  Stable today for transfer to inpatient rehab.  Await authorization, bed.  DVT prophylaxis: Enoxaparin Code Status: Full Family Communication: Son at bedside. Disposition Plan: CIR? Rec by ST,PT,OT   Murray Hodgkins, MD  [facility] Direct contact: [phone] --Via amion app OR  --[email or web address]; password [email or web address]  7PM-7AM contact night coverage as above 07/24/2017, 7:54 AM  LOS: 3 days   Consultants:  Neurology   Procedures:  EEG Impression: This predominantly drowsy and asleep EEG is normal.    Echo Study Conclusions  - Left ventricle: The cavity size was normal. Systolic function was   normal. The estimated ejection fraction was in the range of 60%   to 65%. Wall motion was normal; there were no regional wall   motion abnormalities. Doppler parameters are consistent with   abnormal left ventricular relaxation (grade 1 diastolic   dysfunction).  Interval history/Subjective: Feels okay.  No complaints.  Objective: Vitals:  Vitals:   07/24/17 0019 07/24/17 0400  BP: (!) 151/60 (!) 145/63  Pulse: 75 79  Resp: 18 18  Temp: 98.7 F (37.1 C) 97.7 F (36.5 C)  SpO2: 98% 99%    Exam: Constitutional:   . Appears calm and comfortable Respiratory:  . CTA bilaterally, no w/r/r.  . Respiratory effort normal Cardiovascular:  . RRR, no m/r/g Psychiatric:  . Mental status o Mood, affect appropriate  I have personally reviewed the following:   Labs:  CBG stable.  Imaging studies:  Carotid ultrasound noted.  Right ICA 40-59% stenosis.  Left ICA 80-99% stenosis.  Medical tests:  EKG  showed sinus rhythm with right bundle branch  block.  Scheduled Meds: . aspirin EC  325 mg Oral Daily  . clopidogrel  75 mg Oral Daily  . enoxaparin (LOVENOX) injection  40 mg Subcutaneous Daily  . ezetimibe  10 mg Oral Daily  . insulin aspart  0-9 Units Subcutaneous TID WC  . insulin glargine  20 Units Subcutaneous Daily  . metoprolol succinate  25 mg Oral Daily  . rosuvastatin  40 mg Oral Daily   Continuous Infusions:  Principal Problem:   Acute CVA (cerebrovascular accident) ([place]) Active Problems:   Chronic kidney disease, stage III (moderate) ([facility])   Essential hypertension   Type 2 diabetes mellitus with renal manifestations ([place])   Hyperlipidemia   Diastolic dysfunction   Acute blood loss anemia   LOS: 3 days

## 2017-07-24 NOTE — Progress Notes (Signed)
STROKE TEAM PROGRESS NOTE   HISTORY OF PRESENT ILLNESS (per record) Eric Long is a 77 y.o. male with a history of diabetes, hyperlipidemia who presents with unsteady gait that started on awakening Saturday morning.  He states that he is not certain why he has been unsteady, but just has not felt like he was walking normally.  He therefore presented to the emergency department today.  He denies left-sided weakness, left-sided discoordination, but he has noticed some possible difficulties with his vision, though the exact character of the problems seem to elude him.   LKW: Friday prior to bed tpa given?: no, outside of window   SUBJECTIVE (INTERVAL HISTORY) The patient's wife and son were in the room.  The patient  Is awake and interactive. He has very minimum left-sided weakness and peripheral vision loss and inattention.he has decided to participate in the stroke atrial fibrillation trial and was randomized to the standard of care.Repeat carotid ultrasound was done today which shows only 40-59% right ICA stenosis but 80- 99% left ICA stenosis which is asymptomatic   OBJECTIVE Temp:  [97.3 F (36.3 C)-98.7 F (37.1 C)] 98.2 F (36.8 C) (03/21 0852) Pulse Rate:  [72-87] 87 (03/21 0852) Cardiac Rhythm: Normal sinus rhythm;Bundle branch block (03/21 0405) Resp:  [18] 18 (03/21 0852) BP: (113-151)/(49-63) 141/62 (03/21 0852) SpO2:  [96 %-99 %] 97 % (03/21 0852)  CBC:  Recent Labs  Lab 07/21/17 1740 07/21/17 1745 07/22/17 0220  WBC 7.7  --  7.0  NEUTROABS 4.6  --   --   HGB 12.7* 12.9* 12.2*  HCT 38.6* 38.0* 37.5*  MCV 93.0  --  92.8  PLT 246  --  381    Basic Metabolic Panel:  Recent Labs  Lab 07/21/17 1740 07/21/17 1745 07/22/17 0220  NA 137 139 137  K 4.1 4.1 3.7  CL 101 103 102  CO2 26  --  26  GLUCOSE 128* 125* 114*  BUN 13 17 15   CREATININE 1.41* 1.40* 1.30*  1.34*  CALCIUM 9.2  --  9.1    Lipid Panel:     Component Value Date/Time   CHOL 274 (H)  07/22/2017 0220   TRIG 218 (H) 07/22/2017 0220   HDL 30 (L) 07/22/2017 0220   CHOLHDL 9.1 07/22/2017 0220   VLDL 44 (H) 07/22/2017 0220   LDLCALC 200 (H) 07/22/2017 0220   HgbA1c:  Lab Results  Component Value Date   HGBA1C 7.6 (H) 07/22/2017   Urine Drug Screen: No results found for: LABOPIA, COCAINSCRNUR, LABBENZ, AMPHETMU, THCU, LABBARB  Alcohol Level No results found for: ETH  IMAGING  Ct Head Wo Contrast Ct Angio Head W Or Wo Contrast Ct Angio Neck W Or Wo Contrast 07/22/2017 IMPRESSION:  Calcified plaque right carotid bifurcation narrowing the internal carotid artery lumen by 55% diameter stenosis.  Tandem 40% diameter stenosis proximal right internal carotid artery.  Severe stenosis origin right external carotid artery.  Moderate stenosis right cavernous carotid. Irregular calcified and noncalcified plaque proximal left internal carotid artery with severe stenosis, 80% diameter stenosis.  Moderate stenosis left cavernous carotid  Severe stenosis origin of left vertebral artery and  moderate stenosis origin of right vertebral artery  Diffuse intracranial atherosclerotic disease as above.  Severe stenosis right M2 segment supplying acute infarct in the right parietal lobe.    Ct Head Wo Contrast 07/21/2017 IMPRESSION:  1. Hypodensity within the right parietal and temporal lobes consistent with acute to subacute infarct. No hemorrhage or significant midline shift  at this time. MRI recommended for further evaluation  2. Mild small vessel ischemic changes of the white matter with atrophy.  Old small infarct in the right frontal lobe.    Mr Brain Wo Contrast 07/21/2017 IMPRESSION:  1. Large area of acute ischemia within the posterior right hemisphere, involving the posterior right MCA territory and a small portion of the right PCA territory. Involvement of 2 vascular territories may indicate an embolic source.  2. Petechial hemorrhage in the posterior right MCA  distribution, but no space-occupying hematoma. No midline shift or other mass effect.  3. Old basal ganglia and cerebellar lacunar infarcts superimposed on chronic microvascular ischemia.     Transthoracic Echocardiogram -  Left ventricle: The cavity size was normal. Systolic function was normal. The estimated ejection fraction was in the range of 60% to 65%. Wall motion was normal; there were no regional wall motion abnormalities 07/22/2017  Carotid Dopplers : right 40-59% ICA stenosis, mid scale, left 80-99% ICA stenosis     PHYSICAL EXAM Vitals:   07/23/17 1957 07/24/17 0019 07/24/17 0400 07/24/17 0852  BP: (!) 120/49 (!) 151/60 (!) 145/63 (!) 141/62  Pulse: 72 75 79 87  Resp: 18 18 18 18   Temp: (!) 97.3 F (36.3 C) 98.7 F (37.1 C) 97.7 F (36.5 C) 98.2 F (36.8 C)  TempSrc: Oral Oral Oral Oral  SpO2: 96% 98% 99% 97%  Weight:      Height:      obese elderly male who is sedated but not in distress. . Afebrile. Head is nontraumatic. Neck is supple without bruit.    Cardiac exam no murmur or gallop. Lungs are clear to auscultation. Distal pulses are well felt. Neurological Exam ;  Patient is awake alert oriented 3. Speech is normal without aphasia or dysarthria. Extraocular moments are full range without nystagmus but he has saccadic dysmetria on left gaze. He has partial left peripheral vision loss. Mild inattention on the left. Face is symmetric without weakness tongue is midline. Motor system exam reveals no upper or lower eczema to drift but there is mild weakness of left grip and intrinsic hand muscles. Orbits right over left approximately. Minimum weakness of left hip flexors.. Deep tendon reflexes symmetric. Plantars are downgoing. Gait not tested.   ASSESSMENT/PLAN Mr. Eric Long is a 77 y.o. male with history of hyperlipidemia, previous strokes and diabetes mellitus presenting with unsteady gait. He did not receive IV t-PA due to late presentation.  Stroke: Right  MCA territory infarcts -likely from intracranial and extracranial large vessel  atherosclerosis.  Resultant  Left hemiplegia  CT head - Hypodensity Rt parietal and temporal lobes c/w acute to subacute infarcts.  MRI head - Large area of acute ischemia within the posterior right hemisphere, involving the posterior right MCA territory and a small portion of the right PCA territory. Etiology embolic source unknown  MRA head - not performed  CTA H&N -severe stenosis Rt ext carotid artery, Lt vertebral artery, and Rt M2.  Carotid Doppler - CTA neck 2D Echo - Left ventricle: The cavity size was normal. Systolic function was normal. The estimated ejection fraction was in the range of 60% to 65%. Wall motion was normal; there were no regional wall motion abnormalities  LDL 200  HgbA1c - 7.6  VTE prophylaxis - Lovenox Fall precautions Diet Heart Room service appropriate? Yes; Fluid consistency: Thin  No antithrombotic prior to admission, now on aspirin 325 mg daily  Patient counseled to be compliant with his antithrombotic medications  Ongoing aggressive stroke risk factor management  Therapy recommendations: CLR Disposition:  Inpatient rehab Hypertension  Stable  Permissive hypertension (OK if < 220/120) but gradually normalize in 5-7 days  Long-term BP goal normotensive  Hyperlipidemia  Lipid lowering medication PTA: Crestor 40 mg daily and Zetia 10 mg daily  LDL 200, goal < 70  Current lipid lowering medication: Crestor 40 mg daily and Zetia 10 mg daily  Verify patient compliance with medication.  Continue statin at discharge  Diabetes  HgbA1c 7.6, goal < 7.0  Uncontrolled  Other Stroke Risk Factors  Advanced age  Former cigarette smoker - quit  Obesity, Body mass index is 33.81 kg/m., recommend weight loss, diet and exercise as appropriate   Old basal ganglia and cerebellar lacunar infarcts by MRI   Other Active Problems  Mild anemia  Elevated  creatinine  Severe intracranial and extracranial atherosclerosis as documented above on CT angiogram.  Petechial hemorrhage in the posterior right MCA distribution  Asymptomatic severe left extracranial carotid stenosis. Recommend outpatient referral to vascular surgery to consider revascularization   Plan / Recommendations    discharge from inpatient rehabilitation when bed available.  Outpatient referral to vascular surgery for severe left asymptomatic carotid revascularization  Follow-up as an outpatient in the stroke clinic in 6 weeks  Follow-up in cardiovascular research clinic for stroke atrial fibrillation trial follow-up as per study protocol   Hospital day # 3     .continue dual antiplatelet therapy for 3 months and aggressive risk factor modification. physical occupational therapy and rehabilitation   Discussed with Dr Sarajane Jews.and patient's family at the bedside   Stroke team will sign off. Follow-up in the stroke clinic in 6 weeks. Kindly call for questions. Antony Contras, MD Medical Director [facility] Pager: [phone] 07/24/2017 10:16 AM   To contact Stroke Continuity provider, please refer to [email or web address] After hours, contact General Neurology

## 2017-07-24 NOTE — Progress Notes (Signed)
Carotid duplex prelim: right 40-59% ICA stenosis, mid scale, left 80-99% ICA stenosis. Landry Mellow, RDMS, RVT

## 2017-07-24 NOTE — Progress Notes (Signed)
Physical Therapy Treatment Patient Details Name: Eric Long MRN: [id number] DOB: [birth year] Today's Date: 07/24/2017    History of Present Illness 77 y.o.male with a history of diabetes, hyperlipidemiawho presents with unsteady gait. MRI: Large area of acute ischemia within the posterior right hemisphere, involving the posterior right MCA territory and a small portion of the right PCA territory.    PT Comments    Pt is progressing towards his goals, however is limited in his safe mobility by decreased L sided attention and decreased L sided proprioception and coordination. Worked with pt in standing and seated on therex with attention to location of L LE, intensity of motion and visualization of movement. Pt has very supportive family and is very motivated to return to [facility] making him a perfect candidate for CIR level rehab.PT will continue to follow acutely until d/c.    Follow Up Recommendations  CIR;Supervision/Assistance - 24 hour     Equipment Recommendations  Rolling walker with 5" wheels       Precautions / Restrictions Precautions Precautions: Fall;Other (comment) Precaution Comments: Left visual deficits Restrictions Weight Bearing Restrictions: No    Mobility  Bed Mobility               General bed mobility comments: OOB in recliner  Transfers Overall transfer level: Needs assistance Equipment used: Rolling walker (2 wheeled) Transfers: Sit to/from Stand Sit to Stand: Min assist         General transfer comment: Min A to steady in static standing able to maintain   Ambulation/Gait             General Gait Details: did not attempt due to instability     Modified Rankin (Stroke Patients Only) Modified Rankin (Stroke Patients Only) Pre-Morbid Rankin Score: No symptoms Modified Rankin: Moderately severe disability     Balance Overall balance assessment: Needs assistance Sitting-balance support: No upper extremity supported;Feet  supported Sitting balance-Leahy Scale: Fair Sitting balance - Comments: Able to maintain static sitting. posterior lean noted with dyanmic movement   Standing balance support: Bilateral upper extremity supported;During functional activity Standing balance-Leahy Scale: Poor Standing balance comment: Reliant on UE support and physical A                             Cognition Arousal/Alertness: Awake/alert Behavior During Therapy: WFL for tasks assessed/performed Overall Cognitive Status: Impaired/Different from baseline Area of Impairment: Attention;Memory;Following commands;Safety/judgement;Awareness;Problem solving                   Current Attention Level: Sustained Memory: Decreased short-term memory Following Commands: Follows one step commands with increased time;Follows multi-step commands with increased time Safety/Judgement: Decreased awareness of safety;Decreased awareness of deficits Awareness: Emergent Problem Solving: Slow processing;Decreased initiation;Difficulty sequencing;Requires verbal cues;Requires tactile cues General Comments: Pt more aware of his deficits today however still has slow processing and decreased knowledge of L side.       Exercises General Exercises - Lower Extremity Long Arc Quad: AROM;Both;10 reps;Seated Hip ABduction/ADduction: AROM;Both;10 reps;Seated Hip Flexion/Marching: AROM;Both;Seated;10 reps;Standing Toe Raises: AROM;Left;10 reps;Seated Heel Raises: AROM;Both;10 reps;Seated        Pertinent Vitals/Pain Pain Assessment: No/denies pain           PT Goals (current goals can now be found in the care plan section) Acute Rehab PT Goals Patient Stated Goal: Go home and return to PLOF PT Goal Formulation: With patient/family Time For Goal Achievement: 08/06/17 Potential to Achieve Goals: Good  Progress towards PT goals: Progressing toward goals    Frequency    Min 4X/week      PT Plan Current plan remains  appropriate       AM-PAC PT "6 Clicks" Daily Activity  Outcome Measure  Difficulty turning over in bed (including adjusting bedclothes, sheets and blankets)?: A Little Difficulty moving from lying on back to sitting on the side of the bed? : A Lot Difficulty sitting down on and standing up from a chair with arms (e.g., wheelchair, bedside commode, etc,.)?: Unable Help needed moving to and from a bed to chair (including a wheelchair)?: A Little Help needed walking in hospital room?: A Little Help needed climbing 3-5 steps with a railing? : Total 6 Click Score: 13    End of Session Equipment Utilized During Treatment: Gait belt Activity Tolerance: Patient tolerated treatment well Patient left: in chair;with call bell/phone within reach;with chair alarm set;with family/visitor present Nurse Communication: Mobility status PT Visit Diagnosis: Unsteadiness on feet (R26.81);Other symptoms and signs involving the nervous system (R29.898);Other abnormalities of gait and mobility (R26.89)     Time: 7846-9629 PT Time Calculation (min) (ACUTE ONLY): 27 min  Charges:  $Therapeutic Exercise: 8-22 mins $Therapeutic Activity: 8-22 mins                    G Codes:       Rayaan Lorah B. Migdalia Dk PT, DPT Acute Rehabilitation  [phone] Pager [phone]     [place] 07/24/2017, 4:24 PM

## 2017-07-25 LAB — GLUCOSE, CAPILLARY
GLUCOSE-CAPILLARY: 73 mg/dL (ref 65–99)
GLUCOSE-CAPILLARY: 115 mg/dL — AB (ref 65–99)
GLUCOSE-CAPILLARY: 84 mg/dL (ref 65–99)
Glucose-Capillary: 130 mg/dL — ABNORMAL HIGH (ref 65–99)

## 2017-07-25 MED ORDER — ASPIRIN 325 MG PO TBEC: 325.0000 mg | DELAYED_RELEASE_TABLET | Freq: Every day | ORAL | 0 refills | Status: AC

## 2017-07-25 MED ORDER — CLOPIDOGREL BISULFATE 75 MG PO TABS: 75.0000 mg | ORAL_TABLET | Freq: Every day | ORAL | Status: DC

## 2017-07-25 NOTE — Care Management Important Message (Signed)
Important Message  Patient Details  Name: Eric Long MRN: [id number] Date of Birth: October 27, 1940   Medicare Important Message Given:  Yes    [name] 07/25/2017, 2:00 PM

## 2017-07-25 NOTE — Progress Notes (Signed)
Pt found on left side half way lying down on floor when walk in to check on telemetry lead. BP 177/61, Temp 98.7, P84, R18, O2 99 RA. Denies any pain. Denies hitting head. Wife states "he got out of bed and walked and slid down."  Pt states, "my leg give in." When asked why pt didn't call, pt states "that's a good question." Alert and oriented. Skin intact.  Pt had condom cath on. Urine on floor, gown wet. Yellow socks on. Left side weakness at baseline with left hemi drift. Wife at bedside. NP Gaetano Net) made aware with no new order.

## 2017-07-25 NOTE — Care Management Note (Signed)
Case Management Note  Patient Details  Name: Eric Long MRN: [id number] Date of Birth: May 02, 1941  Subjective/Objective:      Pt admitted with CVA. He is from home with spouse.              Action/Plan: Recommendations are for CIR. Per CIR we are awaiting [employer] authorization. CM following for d/c disposition.  Expected Discharge Date:                  Expected Discharge Plan:  IP Rehab Facility  In-House Referral:  Clinical Social Work  Discharge planning Services  CM Consult  Post Acute Care Choice:    Choice offered to:     DME Arranged:    DME Agency:     HH Arranged:    [facility] Agency:     Status of Service:  In process, will continue to follow  If discussed at Long Length of Stay Meetings, dates discussed:    Additional Comments:  Pollie Friar, RN 07/25/2017, 10:26 AM

## 2017-07-25 NOTE — Progress Notes (Signed)
Occupational Therapy Treatment Patient Details Name: JAVARIE CRISP MRN: [id number] DOB: [birth year] Today's Date: 07/25/2017    History of present illness 77 y.o.male with a history of diabetes, hyperlipidemiawho presents with unsteady gait. MRI: Large area of acute ischemia within the posterior right hemisphere, involving the posterior right MCA territory and a small portion of the right PCA territory.   OT comments  Pt requires mod verbal cues to scan fully to Lt.  Worked on weight shifting to Left and safety awareness.  He requires mod A for LB ADLs and min A for UB.  Mod A for functional transfers.  Pt with poor safety awareness.  Recommend CIR.   Follow Up Recommendations  CIR;Supervision/Assistance - 24 hour    Equipment Recommendations  None recommended by OT    Recommendations for Other Services      Precautions / Restrictions Precautions Precautions: Fall Precaution Comments: Lt inattention and Lt HH        Mobility Bed Mobility Overal bed mobility: Needs Assistance Bed Mobility: Supine to Sit     Supine to sit: Min guard     General bed mobility comments: mod cues to move Lt LE off bed   Transfers Overall transfer level: Needs assistance Equipment used: 1 person hand held assist Transfers: Sit to/from [employer] Sit to Stand: Min assist   Squat pivot transfers: Mod assist     General transfer comment: assist for balance, and mod cues and mod assist to safely pivot to chair - step by step cues provided     Balance Overall balance assessment: Needs assistance Sitting-balance support: No upper extremity supported;Feet supported Sitting balance-Leahy Scale: Good Sitting balance - Comments: able to don socks without LOB while sitting EOB    Standing balance support: Single extremity supported Standing balance-Leahy Scale: Poor Standing balance comment: UE support and min A for static standing                            ADL  either performed or assessed with clinical judgement   ADL Overall ADL's : Needs assistance/impaired                     Lower Body Dressing: Sit to/from stand;Moderate assistance Lower Body Dressing Details (indicate cue type and reason): assist for balance and to pull pants over hips  Toilet Transfer: Moderate assistance;RW;Ambulation           Functional mobility during ADLs: Moderate assistance       Vision   Additional Comments: Lt inattention and Lt HH    Perception     Praxis      Cognition Arousal/Alertness: Awake/alert Behavior During Therapy: Flat affect;WFL for tasks assessed/performed Overall Cognitive Status: Impaired/Different from baseline Area of Impairment: Attention;Following commands;Safety/judgement;Awareness;Problem solving                   Current Attention Level: Sustained Memory: Decreased short-term memory Following Commands: Follows one step commands consistently Safety/Judgement: Decreased awareness of safety Awareness: Emergent Problem Solving: Slow processing;Difficulty sequencing;Requires verbal cues;Requires tactile cues General Comments: Pt able to state his deficits and how they impact his functional performance, but 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   He also states he last practiced 5 years ago "when he was in the [employer] in 1968".   He required max cues to deduct that that time frame was 40 years ago,  and that due to Lt sided weakness, his body may not perform as he is accustomed to and his risk for injury is great         Exercises Exercises: Other exercises Other Exercises Other Exercises: worked on weight shifting to Lt, and cues to look to Lt during activity - mod facilitation    Shoulder Instructions       General Comments Discussed Lt inattention and visual deficits with pt and with wife     Pertinent Vitals/ Pain       Pain Assessment: Faces Faces Pain Scale: Hurts a  little bit Pain Location: Lt wrist when weight bearing with wrist in extension  Pain Descriptors / Indicators: Grimacing Pain Intervention(s): Monitored during session  Home Living                                          Prior Functioning/Environment              Frequency  Min 3X/week        Progress Toward Goals  OT Goals(current goals can now be found in the care plan section)  Progress towards OT goals: Progressing toward goals     Plan Discharge plan remains appropriate    Co-evaluation                 AM-PAC PT "6 Clicks" Daily Activity     Outcome Measure   Help from another person eating meals?: None Help from another person taking care of personal grooming?: A Little Help from another person toileting, which includes using toliet, bedpan, or urinal?: A Lot Help from another person bathing (including washing, rinsing, drying)?: A Lot Help from another person to put on and taking off regular upper body clothing?: A Little Help from another person to put on and taking off regular lower body clothing?: A Lot 6 Click Score: 16    End of Session    OT Visit Diagnosis: Unsteadiness on feet (R26.81);Other abnormalities of gait and mobility (R26.89);Muscle weakness (generalized) (M62.81);Other symptoms and signs involving cognitive function;Hemiplegia and hemiparesis;Low vision, both eyes (H54.2);Ataxia, unspecified (R27.0) Hemiplegia - Right/Left: Left Hemiplegia - dominant/non-dominant: Non-Dominant Hemiplegia - caused by: Cerebral infarction   Activity Tolerance Patient tolerated treatment well   Patient Left in chair;with call bell/phone within reach;with chair alarm set;with family/visitor present   Nurse Communication Mobility status        Time: 6834-1962 OT Time Calculation (min): 47 min  Charges: OT General Charges $OT Visit: 1 Visit OT Treatments $Neuromuscular Re-education: 38-52 mins  [facility],  OTR/L [phone]    Lucille Passy M 07/25/2017, 12:58 PM

## 2017-07-25 NOTE — Discharge Summary (Signed)
Physician Discharge Summary  Eric Long ZGY:[id number] DOB: [birth year] DOA: 07/21/2017  PCP: Lawerance Cruel, MD  Admit date: 07/21/2017 Discharge date: 07/25/2017  Recommendations for Outpatient Follow-up:   Right posterior MCA infarct  Asymptomatic severe left extracranial carotid stenosis. --Recommend outpatient referral to vascular surgery to consider revascularization.  Follow-up Information    Lawerance Cruel, MD. Schedule an appointment as soon as possible for a visit in 2 week(s).   Specialty:  Family Medicine Contact information: [place] [address] [phone]           Follow-up Information    Lawerance Cruel, MD. Schedule an appointment as soon as possible for a visit in 2 week(s).   Specialty:  Family Medicine Contact information: [place] [address] [phone]            Discharge Diagnoses:  1. Right posterior MCA infarct 2. Asymptomatic severe left extracranial carotid stenosis. 3. Acute encephalopathy 4. Diabetes mellitus type 2 5. Essential hypertension 6. Hyperlipidemia 7. Chronic kidney disease stage III 8. Normocytic anemia 9. Peripheral vascular disease    Discharge Condition: improved Disposition: CIR  Diet recommendation: heart healthy, diabetic diet  Filed Weights   07/21/17 1723 07/22/17 0207  Weight: 108.9 kg (240 lb) 108.4 kg (238 lb 15.7 oz)    History of present illness:  77 year old man who was noted to have some confusion as an outpatient as well as unsteady gait and therefore was taken to his primary care physician 3/18, CT head was obtained as an outpatient which was concerning for stroke and so the patient was sent to the emergency department. Initial examination in the emergency department showed some speech delay, dissymmetry and ataxia of the left arm and hesitancy following commands.  MRI revealed large right-sided stroke involving MCA and portions of PCA  territory.  Admitted for neurology evaluation.  Hospital Course:  Patient was seen by neurology and underwent extensive stroke evaluation.  Neurology recommendations as below.  Plan for inpatient rehab  Right posterior MCA infarct.  No TPA secondary to late presentation.  Thought secondary to intracranial or extracranial large vessel atherosclerosis neurology evaluated the patient recommended further investigation.  EEG was normal.  CT angiogram showed moderate right middle cerebral artery stenosis with stable right MCA branch infarct without hemorrhagic transformation.  Also seen moderate proximal right common carotid artery and severe left internal carotid and vertebral artery stenosis.  LDL 200.  Hemoglobin A1c 7.6.  Carotid ultrasound: right ICA 40-59% stenosis.  ECA greater than 50% stenosis.  Left ICA 80-99% stenosis. --Remained stable.  Continue dual antiplatelet therapy for 3 months and aggressive risk factor modification.  Follow-up with outpatient stroke clinic in 6 weeks. --Currently pursuing inpatient rehab  Asymptomatic severe left extracranial carotid stenosis. --Recommend outpatient referral to vascular surgery to consider revascularization.  Acute encephalopathy --Secondary to acute stroke.  Resolved.  Diabetes mellitus type 2 --Remains stable  Essential hypertension --Resume antihypertensives.  Hyperlipidemia, LDL 200.  Goal less than 70. --Continue Crestor, Zetia  Chronic kidney disease stage III --At baseline  Normocytic anemia --Remains stable.  Peripheral vascular disease  --Continue aspirin and statin   Consultants:  Neurology   Procedures:  EEG Impression: Thispredominantly drowsy and asleepEEG is normal.   Echo Study Conclusions  - Left ventricle: The cavity size was normal. Systolic function was normal. The estimated ejection fraction was in the range of 60% to 65%. Wall motion was normal; there were no regional wall motion  abnormalities. Doppler parameters are consistent with abnormal left ventricular relaxation (grade 1 diastolic dysfunction).   Today's assessment: S: Fell last night onto his left side, no apparent sequela.  No pain.  No head injury. O: Vitals:  Vitals:   07/25/17 0418 07/25/17 1250  BP: (!) 159/63 (!) 169/52  Pulse: 75 71  Resp: 18 18  Temp: (!) 97.5 F (36.4 C) 98.1 F (36.7 C)  SpO2: 97% 97%    Constitutional:  . Appears calm and comfortable Respiratory:  . CTA bilaterally, no w/r/r.  . Respiratory effort normal Cardiovascular:  . RRR, no m/r/g . No LE extremity edema   Musculoskeletal:  . Moves all extremities to command. Neurologic:  . No apparent changes Psychiatric:  . Mental status o Mood, affect appropriate    Discharge Instructions   Allergies as of 07/25/2017      Reactions   Aspirin    Diarrhea      Medication List    TAKE these medications   amlodipine-benazepril 2.5-10 MG capsule Commonly known as:  LOTREL Take 1 capsule by mouth daily.   aspirin 325 MG EC tablet Take 1 tablet (325 mg total) by mouth daily. Start taking on:  07/26/2017   clopidogrel 75 MG tablet Commonly known as:  PLAVIX Take 1 tablet (75 mg total) by mouth daily. Start taking on:  07/26/2017   ezetimibe 10 MG tablet Commonly known as:  ZETIA Take 1 tablet (10 mg total) by mouth daily.   glucose blood test strip Commonly known as:  ONETOUCH VERIO Use to check blood sugar 2 times per day by rotation of meals. Dx code: E11.9   insulin aspart 100 UNIT/ML FlexPen Commonly known as:  NOVOLOG FLEXPEN 5-7 units at main meal   liraglutide 18 MG/3ML Sopn Commonly known as:  VICTOZA INJECT 0.3 MLS  (1.8 MG TOTAL) INTO THE SKIN ONCE A DAY   metoprolol succinate 25 MG 24 hr tablet Commonly known as:  TOPROL-XL Take 25 mg by mouth daily.   RELION PEN NEEDLES 32G X 4 MM Misc Generic drug:  Insulin Pen Needle USE ONE PEN NEEDLE TWICE DAILY   rosuvastatin 40 MG  tablet Commonly known as:  CRESTOR Take 40 mg by mouth daily.   TRESIBA FLEXTOUCH 100 UNIT/ML Sopn FlexTouch Pen Generic drug:  insulin degludec INJECT 25 UNITS SUBCUTANEOUSLY ONCE DAILY      Allergies  Allergen Reactions  . Aspirin     Diarrhea    The results of significant diagnostics from this hospitalization (including imaging, microbiology, ancillary and laboratory) are listed below for reference.    Significant Diagnostic Studies: Ct Angio Head W Or Wo Contrast  Result Date: 07/22/2017 CLINICAL DATA:  Stroke.  Diabetes and hyperlipidemia EXAM: CT ANGIOGRAPHY HEAD AND NECK TECHNIQUE: Multidetector CT imaging of the head and neck was performed using the standard protocol during bolus administration of intravenous contrast. Multiplanar CT image reconstructions and MIPs were obtained to evaluate the vascular anatomy. Carotid stenosis measurements (when applicable) are obtained utilizing NASCET criteria, using the distal internal carotid diameter as the denominator. CONTRAST:  1mL ISOVUE-370 IOPAMIDOL (ISOVUE-370) INJECTION 76% COMPARISON:  CT and MRI head 07/21/2017 FINDINGS: CT HEAD FINDINGS Brain: Ill-defined hypodensity right parietal lobe compatible with acute infarct, unchanged from prior studies. No acute hemorrhage identified. Chronic infarct right frontal lobe over the vertex. Chronic microvascular ischemic changes in the white matter. Negative for hemorrhage or mass. Negative for hydrocephalus. Vascular: Negative for hyperdense vessel Skull: Negative Sinuses: Negative Orbits: Negative Review of  the MIP images confirms the above findings CTA NECK FINDINGS Aortic arch: Atherosclerotic disease in the aortic arch with moderate plaque present. No dissection or aneurysm. Atherosclerotic disease in the proximal great vessels without significant stenosis. Right carotid system: Mild atherosclerotic disease right common carotid artery. Extensive calcified plaque right carotid bifurcation  narrowing the lumen to 2.4 mm, corresponding to 55% diameter stenosis. Tandem stenosis proximal right internal carotid artery 40% diameter stenosis. Severe stenosis origin of right external carotid artery Left carotid system: Mild atherosclerotic calcification left common carotid artery. Irregular calcified and noncalcified plaque proximal left internal carotid artery narrowing the lumen to 1.2 mm, 80% diameter stenosis. Vertebral arteries: Moderate stenosis origin of right vertebral artery. Mild atherosclerotic calcification mid right vertebral artery without additional stenosis Severe stenosis at the origin of the left vertebral artery. Mild scattered atherosclerotic disease throughout the left vertebral artery which is patent to the basilar. Skeleton: No acute skeletal abnormality.  Poor dentition. Other neck: Mild goiter with small 5 mm nodules on the left. Upper chest: Mild apical emphysema. Review of the MIP images confirms the above findings CTA HEAD FINDINGS Anterior circulation: Atherosclerotic calcification throughout the cavernous carotid bilaterally with mild to moderate stenosis bilaterally. Anterior cerebral arteries widely patent bilaterally. Mild stenosis distal left M1 segment. Right M1 segment mildly stenotic distally. Severe focal stenosis right M2 segment supplying the parietal lobe corresponding to acute infarction. Posterior circulation: Both vertebral arteries patent to the basilar. PICA patent bilaterally. Basilar widely patent. Superior cerebellar and posterior cerebral arteries patent bilaterally. Moderate stenosis in the left P2 segment. Venous sinuses: Patent Anatomic variants: None Delayed phase: Not performed Review of the MIP images confirms the above findings IMPRESSION: Calcified plaque right carotid bifurcation narrowing the internal carotid artery lumen by 55% diameter stenosis. Tandem 40% diameter stenosis proximal right internal carotid artery. Severe stenosis origin right  external carotid artery. Moderate stenosis right cavernous carotid. Irregular calcified and noncalcified plaque proximal left internal carotid artery with severe stenosis, 80% diameter stenosis. Moderate stenosis left cavernous carotid Severe stenosis origin of left vertebral artery and moderate stenosis origin of right vertebral artery Diffuse intracranial atherosclerotic disease as above. Severe stenosis right M2 segment supplying acute infarct in the right parietal lobe. Electronically Signed   By: Franchot Gallo M.D.   On: 07/22/2017 08:19   Ct Head Wo Contrast  Result Date: 07/22/2017 CLINICAL DATA:  Stroke.  Diabetes and hyperlipidemia EXAM: CT ANGIOGRAPHY HEAD AND NECK TECHNIQUE: Multidetector CT imaging of the head and neck was performed using the standard protocol during bolus administration of intravenous contrast. Multiplanar CT image reconstructions and MIPs were obtained to evaluate the vascular anatomy. Carotid stenosis measurements (when applicable) are obtained utilizing NASCET criteria, using the distal internal carotid diameter as the denominator. CONTRAST:  62mL ISOVUE-370 IOPAMIDOL (ISOVUE-370) INJECTION 76% COMPARISON:  CT and MRI head 07/21/2017 FINDINGS: CT HEAD FINDINGS Brain: Ill-defined hypodensity right parietal lobe compatible with acute infarct, unchanged from prior studies. No acute hemorrhage identified. Chronic infarct right frontal lobe over the vertex. Chronic microvascular ischemic changes in the white matter. Negative for hemorrhage or mass. Negative for hydrocephalus. Vascular: Negative for hyperdense vessel Skull: Negative Sinuses: Negative Orbits: Negative Review of the MIP images confirms the above findings CTA NECK FINDINGS Aortic arch: Atherosclerotic disease in the aortic arch with moderate plaque present. No dissection or aneurysm. Atherosclerotic disease in the proximal great vessels without significant stenosis. Right carotid system: Mild atherosclerotic disease  right common carotid artery. Extensive calcified plaque right carotid bifurcation narrowing  the lumen to 2.4 mm, corresponding to 55% diameter stenosis. Tandem stenosis proximal right internal carotid artery 40% diameter stenosis. Severe stenosis origin of right external carotid artery Left carotid system: Mild atherosclerotic calcification left common carotid artery. Irregular calcified and noncalcified plaque proximal left internal carotid artery narrowing the lumen to 1.2 mm, 80% diameter stenosis. Vertebral arteries: Moderate stenosis origin of right vertebral artery. Mild atherosclerotic calcification mid right vertebral artery without additional stenosis Severe stenosis at the origin of the left vertebral artery. Mild scattered atherosclerotic disease throughout the left vertebral artery which is patent to the basilar. Skeleton: No acute skeletal abnormality.  Poor dentition. Other neck: Mild goiter with small 5 mm nodules on the left. Upper chest: Mild apical emphysema. Review of the MIP images confirms the above findings CTA HEAD FINDINGS Anterior circulation: Atherosclerotic calcification throughout the cavernous carotid bilaterally with mild to moderate stenosis bilaterally. Anterior cerebral arteries widely patent bilaterally. Mild stenosis distal left M1 segment. Right M1 segment mildly stenotic distally. Severe focal stenosis right M2 segment supplying the parietal lobe corresponding to acute infarction. Posterior circulation: Both vertebral arteries patent to the basilar. PICA patent bilaterally. Basilar widely patent. Superior cerebellar and posterior cerebral arteries patent bilaterally. Moderate stenosis in the left P2 segment. Venous sinuses: Patent Anatomic variants: None Delayed phase: Not performed Review of the MIP images confirms the above findings IMPRESSION: Calcified plaque right carotid bifurcation narrowing the internal carotid artery lumen by 55% diameter stenosis. Tandem 40% diameter  stenosis proximal right internal carotid artery. Severe stenosis origin right external carotid artery. Moderate stenosis right cavernous carotid. Irregular calcified and noncalcified plaque proximal left internal carotid artery with severe stenosis, 80% diameter stenosis. Moderate stenosis left cavernous carotid Severe stenosis origin of left vertebral artery and moderate stenosis origin of right vertebral artery Diffuse intracranial atherosclerotic disease as above. Severe stenosis right M2 segment supplying acute infarct in the right parietal lobe. Electronically Signed   By: Franchot Gallo M.D.   On: 07/22/2017 08:19   Ct Head Wo Contrast  Result Date: 07/21/2017 CLINICAL DATA:  Disorientation and unsteady gait for 2 days EXAM: CT HEAD WITHOUT CONTRAST TECHNIQUE: Contiguous axial images were obtained from the base of the skull through the vertex without intravenous contrast. COMPARISON:  None. FINDINGS: Brain: Hypodensity within the right parietal and superior temporal lobes consistent with acute to subacute infarct. No definitive hemorrhage is seen. Encephalomalacia within the right posterior frontal lobe consistent with prior infarct. Age indeterminate hypodensity within the right frontal parietal white matter. Mild small vessel ischemic changes of the white matter. Probable old lacunar infarcts in the bilateral caudate. Mild atrophy. Nonenlarged ventricles. No midline shift. Fourth ventricle is patent. Basilar cisterns are patent. Vascular: No hyperdense vessels.  Carotid vascular calcification. Skull: No fracture or suspicious lesion. Sinuses/Orbits: No acute finding. Other: None IMPRESSION: 1. Hypodensity within the right parietal and temporal lobes consistent with acute to subacute infarct. No hemorrhage or significant midline shift at this time. MRI recommended for further evaluation 2. Mild small vessel ischemic changes of the white matter with atrophy. Old small infarct in the right frontal lobe.  Critical Value/emergent results were called by telephone at the time of interpretation on 07/21/2017 at 4:09 pm to [facility] BRAKE , who verbally acknowledged these results. Electronically Signed   By: Donavan Foil M.D.   On: 07/21/2017 16:09   Ct Angio Neck W Or Wo Contrast  Result Date: 07/22/2017 CLINICAL DATA:  Stroke.  Diabetes and hyperlipidemia EXAM: CT ANGIOGRAPHY HEAD AND NECK  TECHNIQUE: Multidetector CT imaging of the head and neck was performed using the standard protocol during bolus administration of intravenous contrast. Multiplanar CT image reconstructions and MIPs were obtained to evaluate the vascular anatomy. Carotid stenosis measurements (when applicable) are obtained utilizing NASCET criteria, using the distal internal carotid diameter as the denominator. CONTRAST:  73mL ISOVUE-370 IOPAMIDOL (ISOVUE-370) INJECTION 76% COMPARISON:  CT and MRI head 07/21/2017 FINDINGS: CT HEAD FINDINGS Brain: Ill-defined hypodensity right parietal lobe compatible with acute infarct, unchanged from prior studies. No acute hemorrhage identified. Chronic infarct right frontal lobe over the vertex. Chronic microvascular ischemic changes in the white matter. Negative for hemorrhage or mass. Negative for hydrocephalus. Vascular: Negative for hyperdense vessel Skull: Negative Sinuses: Negative Orbits: Negative Review of the MIP images confirms the above findings CTA NECK FINDINGS Aortic arch: Atherosclerotic disease in the aortic arch with moderate plaque present. No dissection or aneurysm. Atherosclerotic disease in the proximal great vessels without significant stenosis. Right carotid system: Mild atherosclerotic disease right common carotid artery. Extensive calcified plaque right carotid bifurcation narrowing the lumen to 2.4 mm, corresponding to 55% diameter stenosis. Tandem stenosis proximal right internal carotid artery 40% diameter stenosis. Severe stenosis origin of right external carotid artery Left carotid  system: Mild atherosclerotic calcification left common carotid artery. Irregular calcified and noncalcified plaque proximal left internal carotid artery narrowing the lumen to 1.2 mm, 80% diameter stenosis. Vertebral arteries: Moderate stenosis origin of right vertebral artery. Mild atherosclerotic calcification mid right vertebral artery without additional stenosis Severe stenosis at the origin of the left vertebral artery. Mild scattered atherosclerotic disease throughout the left vertebral artery which is patent to the basilar. Skeleton: No acute skeletal abnormality.  Poor dentition. Other neck: Mild goiter with small 5 mm nodules on the left. Upper chest: Mild apical emphysema. Review of the MIP images confirms the above findings CTA HEAD FINDINGS Anterior circulation: Atherosclerotic calcification throughout the cavernous carotid bilaterally with mild to moderate stenosis bilaterally. Anterior cerebral arteries widely patent bilaterally. Mild stenosis distal left M1 segment. Right M1 segment mildly stenotic distally. Severe focal stenosis right M2 segment supplying the parietal lobe corresponding to acute infarction. Posterior circulation: Both vertebral arteries patent to the basilar. PICA patent bilaterally. Basilar widely patent. Superior cerebellar and posterior cerebral arteries patent bilaterally. Moderate stenosis in the left P2 segment. Venous sinuses: Patent Anatomic variants: None Delayed phase: Not performed Review of the MIP images confirms the above findings IMPRESSION: Calcified plaque right carotid bifurcation narrowing the internal carotid artery lumen by 55% diameter stenosis. Tandem 40% diameter stenosis proximal right internal carotid artery. Severe stenosis origin right external carotid artery. Moderate stenosis right cavernous carotid. Irregular calcified and noncalcified plaque proximal left internal carotid artery with severe stenosis, 80% diameter stenosis. Moderate stenosis left  cavernous carotid Severe stenosis origin of left vertebral artery and moderate stenosis origin of right vertebral artery Diffuse intracranial atherosclerotic disease as above. Severe stenosis right M2 segment supplying acute infarct in the right parietal lobe. Electronically Signed   By: Franchot Gallo M.D.   On: 07/22/2017 08:19   Mr Brain Wo Contrast  Result Date: 07/21/2017 CLINICAL DATA:  CTA.  Loss of balance.  Fall. EXAM: MRI HEAD WITHOUT CONTRAST TECHNIQUE: Multiplanar, multiecho pulse sequences of the brain and surrounding structures were obtained without intravenous contrast. COMPARISON:  Head CT 07/21/2017 FINDINGS: Brain: The midline structures are normal. Large area of abnormal diffusion restriction within the posterior right MCA territory. There is a small area of diffusion restriction within the right PCA territory.  Multiple old cerebellar infarcts and basal ganglia lacunar infarcts. No mass lesion, hydrocephalus, dural abnormality or extra-axial collection. Early confluent hyperintense T2-weighted signal of the periventricular and deep white matter, most commonly due to chronic ischemic microangiopathy. There is edema within the posterior right hemisphere in the distribution of diffusion abnormality. No age-advanced or lobar predominant atrophy. There is petechial hemorrhage within the posterior right MCA territory, but no space-occupying hematoma. Vascular: Major intracranial arterial and venous sinus flow voids are preserved. Skull and upper cervical spine: The visualized skull base, calvarium, upper cervical spine and extracranial soft tissues are normal. Sinuses/Orbits: No fluid levels or advanced mucosal thickening. No mastoid or middle ear effusion. Normal orbits. IMPRESSION: 1. Large area of acute ischemia within the posterior right hemisphere, involving the posterior right MCA territory and a small portion of the right PCA territory. Involvement of 2 vascular territories may indicate an  embolic source. 2. Petechial hemorrhage in the posterior right MCA distribution, but no space-occupying hematoma. No midline shift or other mass effect. 3. Old basal ganglia and cerebellar lacunar infarcts superimposed on chronic microvascular ischemia. Electronically Signed   By: Ulyses Jarred M.D.   On: 07/21/2017 22:59   Labs: Basic Metabolic Panel: Recent Labs  Lab 07/21/17 1740 07/21/17 1745 07/22/17 0220  NA 137 139 137  K 4.1 4.1 3.7  CL 101 103 102  CO2 26  --  26  GLUCOSE 128* 125* 114*  BUN 13 17 15   CREATININE 1.41* 1.40* 1.30*  1.34*  CALCIUM 9.2  --  9.1   Liver Function Tests: Recent Labs  Lab 07/21/17 1740 07/22/17 0220  AST 18 19  ALT 14* 15*  ALKPHOS 102 93  BILITOT 0.4 0.6  PROT 8.0 7.2  ALBUMIN 3.6 3.2*   CBC: Recent Labs  Lab 07/21/17 1740 07/21/17 1745 07/22/17 0220  WBC 7.7  --  7.0  NEUTROABS 4.6  --   --   HGB 12.7* 12.9* 12.2*  HCT 38.6* 38.0* 37.5*  MCV 93.0  --  92.8  PLT 246  --  227    CBG: Recent Labs  Lab 07/24/17 1135 07/24/17 1607 07/24/17 2151 07/25/17 0604 07/25/17 1120  GLUCAP 163* 86 83 73 115*    Principal Problem:   Acute CVA (cerebrovascular accident) ([place]) Active Problems:   Chronic kidney disease, stage III (moderate) (HCC)   Essential hypertension   Type 2 diabetes mellitus with renal manifestations (HCC)   Hyperlipidemia   Diastolic dysfunction   Acute blood loss anemia   Time coordinating discharge: 35 minutes  Signed:  Murray Hodgkins, MD [facility] 07/25/2017, 2:09 PM

## 2017-07-25 NOTE — Progress Notes (Signed)
Physical Therapy Treatment Patient Details Name: Eric Long MRN: [id number] DOB: 28-Jun-1940 Today's Date: 07/25/2017    History of Present Illness 77 y.o.male with a history of diabetes, hyperlipidemiawho presents with unsteady gait. MRI: Large area of acute ischemia within the posterior right hemisphere, involving the posterior right MCA territory and a small portion of the right PCA territory.    PT Comments    Pt continues to make progress towards his goals, however continues to be limited in his safe mobility by deficits with L sided weakness and proprioception. Pt currently requires min A for transfers to static standing in RW, requires modAx2 for ambulation of 2 bouts of 8 feet with close chair follow. Focus of ambulation on sequencing, and proper foot placement and weight shift for ambulation. Given pts PLOF and current deficits pt continues to be a perfect candidate for CIR level rehab at discharge.    Follow Up Recommendations  CIR;Supervision/Assistance - 24 hour     Equipment Recommendations  Rolling walker with 5" wheels       Precautions / Restrictions Precautions Precautions: Fall;Other (comment) Precaution Comments: Left visual deficits Restrictions Weight Bearing Restrictions: No    Mobility  Bed Mobility               General bed mobility comments: OOB in recliner  Transfers Overall transfer level: Needs assistance Equipment used: Rolling walker (2 wheeled) Transfers: Sit to/from Stand Sit to Stand: Min assist         General transfer comment: Min A to steady in static standing able to maintain   Ambulation/Gait Ambulation/Gait assistance: Mod assist;+2 physical assistance Ambulation Distance (Feet): 8 Feet(2 x 8 feet) Assistive device: Rolling walker (2 wheeled) Gait Pattern/deviations: Step-to pattern;Decreased weight shift to right;Decreased dorsiflexion - left;Ataxic;Staggering left;Trunk flexed Gait velocity: Decreased Gait velocity  interpretation: Below normal speed for age/gender General Gait Details: modAx2 for stabilizing, pt with very poor L sided coordination, difficulty with proprioception of L LE, requiring extensive verbal and tactile cues for movement of L LE, pt exhibited both overshoot and undershoot of foot placement, difficulty with weight shift for LE advancement. Pt also with difficulty attending to objects on his L.     Modified Rankin (Stroke Patients Only) Modified Rankin (Stroke Patients Only) Pre-Morbid Rankin Score: No symptoms Modified Rankin: Moderately severe disability     Balance Overall balance assessment: Needs assistance Sitting-balance support: No upper extremity supported;Feet supported Sitting balance-Leahy Scale: Good Sitting balance - Comments: able to balance edge of the recliner without UE support   Standing balance support: Bilateral upper extremity supported;During functional activity Standing balance-Leahy Scale: Poor Standing balance comment: Reliant on UE support and physical A                             Cognition Arousal/Alertness: Awake/alert Behavior During Therapy: Flat affect;WFL for tasks assessed/performed Overall Cognitive Status: Impaired/Different from baseline Area of Impairment: Attention;Following commands;Safety/judgement;Awareness;Problem solving                   Current Attention Level: Sustained Memory: Decreased short-term memory Following Commands: Follows one step commands consistently Safety/Judgement: Decreased awareness of safety Awareness: Emergent Problem Solving: Slow processing;Difficulty sequencing;Requires verbal cues;Requires tactile cues General Comments: Pt able to state his deficits and how they impact his functional performance, but 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   He also states he last practiced 5 years ago "when  he was in the [employer] in 1968".   He required max cues to deduct  that that time frame was 40 years ago, and that due to Lt sided weakness, his body may not perform as he is accustomed to and his risk for injury is great          General Comments General comments (skin integrity, edema, etc.): son present during session       Pertinent Vitals/Pain Pain Assessment: No/denies pain           PT Goals (current goals can now be found in the care plan section) Acute Rehab PT Goals Patient Stated Goal: Go home and return to PLOF PT Goal Formulation: With patient/family Time For Goal Achievement: 08/06/17 Potential to Achieve Goals: Good Progress towards PT goals: Progressing toward goals    Frequency    Min 4X/week      PT Plan Current plan remains appropriate       AM-PAC PT "6 Clicks" Daily Activity  Outcome Measure  Difficulty turning over in bed (including adjusting bedclothes, sheets and blankets)?: A Little Difficulty moving from lying on back to sitting on the side of the bed? : A Lot Difficulty sitting down on and standing up from a chair with arms (e.g., wheelchair, bedside commode, etc,.)?: Unable Help needed moving to and from a bed to chair (including a wheelchair)?: A Little Help needed walking in hospital room?: A Little Help needed climbing 3-5 steps with a railing? : Total 6 Click Score: 13    End of Session Equipment Utilized During Treatment: Gait belt Activity Tolerance: Patient tolerated treatment well Patient left: in chair;with call bell/phone within reach;with chair alarm set;with family/visitor present Nurse Communication: Mobility status PT Visit Diagnosis: Unsteadiness on feet (R26.81);Other symptoms and signs involving the nervous system (R29.898);Other abnormalities of gait and mobility (R26.89)     Time: 6063-0160 PT Time Calculation (min) (ACUTE ONLY): 28 min  Charges:  $Gait Training: 23-37 mins                    G Codes:       [place]ice Liz B. Migdalia Dk PT, DPT Acute Rehabilitation  [phone] Pager [phone]     [place] 07/25/2017, 4:39 PM

## 2017-07-25 NOTE — Progress Notes (Signed)
   07/25/17 0227  What Happened  Was fall witnessed? Yes  Who witnessed fall? Wife  Patients activity before fall bathroom-unassisted  Point of contact buttocks  Was patient injured? No  Follow Up  MD notified  Gaetano Net)  Time MD notified 92  Time family notified 0220 (wife at bedside)  Adult Fall Risk Assessment  Risk Factor Category (scoring not indicated) Fall has occurred during this admission (document High fall risk)  Patient's Fall Risk High Fall Risk (>13 points)  Adult Fall Risk Interventions  Required Bundle Interventions *See Row Information* High fall risk - low, moderate, and high requirements implemented  Additional Interventions Use of appropriate toileting equipment (bedpan, BSC, etc.);Family Supervision (floor mat)  Screening for Fall Injury Risk (To be completed on HIGH fall risk patients who do not meet crieteria for Low Bed) - Assessing Need for Floor Mats Only  Risk For Fall Injury- Criteria for Floor Mats Noncompliant with safety precautions  Will Implement Floor Mats Yes  Vitals  Temp 98.7 F (37.1 C)  Temp Source Oral  BP (!) 177/61  BP Location Right Arm  BP Method Automatic  Patient Position (if appropriate) Sitting  Pulse Rate 84  Pulse Rate Source Dinamap  Resp 18  Oxygen Therapy  SpO2 99 %  O2 Device Room Air

## 2017-07-25 NOTE — Progress Notes (Signed)
Rehab admissions - I have received approval for acute inpatient rehab admission.  I will plan to admit to inpatient rehab in am tomorrow.  I did not have a bed available for admit this afternoon.  Family and patient aware and are agreeable.  Will admit to CIR tomorrow.  Call me for questions.  #[phone]

## 2017-07-25 NOTE — Plan of Care (Signed)
  Problem: Education: Goal: Knowledge of General Education information will improve Outcome: Progressing   Problem: Health Behavior/Discharge Planning: Goal: Ability to manage health-related needs will improve Outcome: Progressing   Problem: Clinical Measurements: Goal: Ability to maintain clinical measurements within normal limits will improve Outcome: Progressing Goal: Will remain free from infection Outcome: Progressing Goal: Diagnostic test results will improve Outcome: Progressing Goal: Respiratory complications will improve Outcome: Progressing Goal: Cardiovascular complication will be avoided Outcome: Progressing   Problem: Activity: Goal: Risk for activity intolerance will decrease Outcome: Progressing   Problem: Nutrition: Goal: Adequate nutrition will be maintained Outcome: Progressing   Problem: Coping: Goal: Level of anxiety will decrease Outcome: Progressing   Problem: Elimination: Goal: Will not experience complications related to bowel motility Outcome: Progressing Goal: Will not experience complications related to urinary retention Outcome: Progressing   Problem: Pain Managment: Goal: General experience of comfort will improve Outcome: Progressing   Problem: Safety: Goal: Ability to remain free from injury will improve Outcome: Progressing   Problem: Skin Integrity: Goal: Risk for impaired skin integrity will decrease Outcome: Progressing   Problem: Education: Goal: Knowledge of disease or condition will improve Outcome: Progressing Goal: Knowledge of secondary prevention will improve Outcome: Progressing Goal: Knowledge of patient specific risk factors addressed and post discharge goals established will improve Outcome: Progressing   Problem: Coping: Goal: Will verbalize positive feelings about self Outcome: Progressing   Problem: Health Behavior/Discharge Planning: Goal: Ability to manage health-related needs will improve Outcome:  Progressing   Problem: Self-Care: Goal: Ability to participate in self-care as condition permits will improve Outcome: Progressing Goal: Verbalization of feelings and concerns over difficulty with self-care will improve Outcome: Progressing Goal: Ability to communicate needs accurately will improve Outcome: Progressing   Problem: Ischemic Stroke/TIA Tissue Perfusion: Goal: Complications of ischemic stroke/TIA will be minimized Outcome: Progressing

## 2017-07-25 NOTE — PMR Pre-admission (Signed)
PMR Admission Coordinator Pre-Admission Assessment  Patient: Eric Long is an 77 y.o., male MRN: [id number] DOB: May 25, 1940 Height: 5' 10.5" (179.1 cm) Weight: 108.4 kg (238 lb 15.7 oz)              Insurance Information HMO: Yes   PPO:       PCP:       IPA:       80/20:       OTHER:   PRIMARY:  [employer] medicare      Policy#:  [id number]      Subscriber: Bill Salinas CM Name: Vaughan Basta      Phone#: [phone]     Fax#: [phone] Pre-Cert#: [id number] for 5 days with update due on 07/29/17      Employer: Retired Benefits:  Phone #: [phone]     Name:  Deveron Furlong. Date: 05/06/17     Deduct:  $0      Out of Pocket Max: $5900 (met $35)      Life Max: N/A CIR: $350/ day with max $1400 per admission      SNF: $0 days 1-20; $164 days 21-100 Outpatient:  Medical necessity     Co-Pay: $40/visit Home Health: 100%      Co-Pay: none DME: 80%     Co-Pay: 20% Providers: in network  Emergency Contact Information Contact Information    Name Relation Home Work Mobile   Petros,[name] Spouse [phone]       Current Medical History  Patient Admitting Diagnosis:  R MCA/PCA infarct  History of Present Illness: A 77 y.o.malewith history of type 2 diabetes mellitus and hyperlipidemia who was admitted on 07/22/2017 with unsteady gait and left-sided weakness times 2 days.History taken from chart review, patient, and family.CT of headreviewed, suggesting right infarct. Per report,hypodensity parietal and temporal lobes consistent with acute to subacute infarct.CTA head neck showed 40-55% right ICA stenosis 80% left ICA stenosis severe stenosis origin left vertebral artery and moderate stenosis right vertebral artery. MRI of brain done showing large area of acute ischemia within the posterior right hemisphere involving right MCA and small portion right PCA question embolic petechial hemorrhage posterior right MCA distribution and old basal ganglia cerebellar lacunar infarcts.2D echo done showing  EF 60-65% no wall abnormalities and grade 1 diastolic dysfunction.EEG was normal study no epileptiform discharges noted.  Dr. Leonie Man feltright MCA territory infarct due to intracranial and extracranial large vessel vessel atherosclerosis--dual antiplatelet recommended and patient to participate in Stroke Afib trial.Carotid dopplers done reveling 40-50% R-ICA stenosis and Left 80-99% ICA stenosis. Patient with resultant left visual field deficits,poor safety awareness,balance deficits and cognitive deficits.CIR recommended due to functional deficits   Total: 4=NIH  Past Medical History  Past Medical History:  Diagnosis Date  . Diabetes mellitus without complication ([place])   . Hyperlipidemia     Family History  family history includes Diabetes in his sister.  Prior Rehab/Hospitalizations:  Has the patient had major surgery during 100 days prior to admission? No  Current Medications   Current Facility-Administered Medications:  .  acetaminophen (TYLENOL) tablet 650 mg, 650 mg, Oral, Q4H PRN **OR** acetaminophen (TYLENOL) solution 650 mg, 650 mg, Per Tube, Q4H PRN **OR** acetaminophen (TYLENOL) suppository 650 mg, 650 mg, Rectal, Q4H PRN, Rise Patience, MD .  aspirin EC tablet 325 mg, 325 mg, Oral, Daily, Rise Patience, MD, 325 mg at 07/25/17 2505 .  clopidogrel (PLAVIX) tablet 75 mg, 75 mg, Oral, Daily, Rinehuls, David L, PA-C, 75 mg at 07/25/17 0923 .  enoxaparin (LOVENOX) injection 40 mg, 40 mg, Subcutaneous, Daily, Rise Patience, MD, 40 mg at 07/25/17 5208 .  ezetimibe (ZETIA) tablet 10 mg, 10 mg, Oral, Daily, Rise Patience, MD, 10 mg at 07/25/17 0223 .  insulin aspart (novoLOG) injection 0-9 Units, 0-9 Units, Subcutaneous, TID WC, Rise Patience, MD, 2 Units at 07/24/17 1207 .  insulin glargine (LANTUS) injection 20 Units, 20 Units, Subcutaneous, Daily, Rise Patience, MD, 20 Units at 07/25/17 [phone] .  metoprolol succinate (TOPROL-XL) 24  hr tablet 25 mg, 25 mg, Oral, Daily, Rise Patience, MD, 25 mg at 07/25/17 2449 .  rosuvastatin (CRESTOR) tablet 40 mg, 40 mg, Oral, Daily, Rise Patience, MD, 40 mg at 07/25/17 7530  Patients Current Diet: Fall precautions Diet Heart Room service appropriate? Yes; Fluid consistency: Thin  Precautions / Restrictions Precautions Precautions: Fall Precaution Comments: Lt inattention and Lt HH  Restrictions Weight Bearing Restrictions: No   Has the patient had 2 or more falls or a fall with injury in the past year?Yes.  Patient reports 3 falls since current CVA.  Prior Activity Level Community (5-7x/wk): Went out 3-4 X a week.  Home Assistive Devices / Equipment Home Assistive Devices/Equipment: CBG Meter, Eyeglasses Home Equipment: Kasandra Knudsen - single point  Prior Device Use: Indicate devices/aids used by the patient prior to current illness, exacerbation or injury? Cane - but did not use it much.  Prior Functional Level Prior Function Level of Independence: Independent Comments: ADLs, IADLs, driving, and enjoys writing. 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  Independent  Indoor Mobility: Did the patient need assistance with walking from room to room (with or without device)? Independent  Stairs: Did the patient need assistance with internal or external stairs (with or without device)? Independent  Functional Cognition: Did the patient need help planning regular tasks such as shopping or remembering to take medications? 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   He also states he last practiced 5 years ago "when he was in the [employer] in 1968".   He required max cues to deduct that that time frame was 40 years ago, and that due to Lt sided weakness, his body may not perform as he is accustomed to and his risk for injury is great  Attention: Selective Selective Attention: Impaired Selective Attention Impairment: Verbal basic, Functional basic Memory: Impaired Memory Impairment: Storage deficit, Retrieval deficit Awareness: Impaired Awareness Impairment: Emergent impairment Problem Solving: Impaired Problem Solving Impairment: Verbal complex Executive Function: Sequencing Sequencing: Impaired Safety/Judgment: Impaired    Extremity Assessment (includes Sensation/Coordination)  Upper Extremity Assessment: Defer to OT evaluation LUE Deficits / Details: WFL strength. Decreased finger opposition and coordination as seen during ADLs and testing. Pt with undershoot and overshooting.  LUE Sensation: WNL(Reporting no sensation deficits) LUE Coordination: decreased fine motor, decreased gross motor  Lower Extremity Assessment: LLE deficits/detail LLE Deficits / Details: With MMT, noted decreased strength in quads, hamstrings and hip flexors.  LLE Sensation: history of peripheral neuropathy(bilaterally, L worse than R) LLE Coordination: decreased gross motor    ADLs  Overall ADL's : Needs assistance/impaired Eating/Feeding: Set up, Sitting, Supervision/ safety Grooming: Set up, Supervision/safety, Sitting Upper Body Bathing: Sitting, Min guard Lower Body Bathing: Sit to/from stand, Moderate assistance Upper Body Dressing : Min guard, Sitting, Cueing for sequencing Upper Body Dressing Details (indicate cue type and reason): Pt donning second gown like jacket. Pt demosntrating poor problem solving and requiring Mod VCs to sequence steps.   Lower Body Dressing: Sit to/from stand, Moderate assistance Lower Body Dressing Details (indicate cue type and reason): assist for balance and to pull pants over hips  Toilet Transfer: Moderate assistance, RW, Ambulation Functional mobility during ADLs: Moderate assistance General ADL Comments: Pt with decreased balance, coordination, cognition, and vision. Pt requiring Mod A for ADLs in standing due to decreased balance. Pt running into the wall, trash can, and chair during funcitonal mobility and required Max cues to change directions and avoid objects.    Mobility  Overal bed mobility: Needs Assistance Bed Mobility: Supine to Sit Supine to sit: Min guard General bed mobility comments: mod cues to move Lt LE off bed     Transfers  Overall transfer level: Needs assistance Equipment used: 1 person hand held assist Transfers: Sit to/from Stand, Squat Pivot Transfers Sit to Stand: Min assist Squat pivot transfers: Mod assist General transfer comment: assist for balance, and mod cues and mod assist to safely pivot to chair - step by step cues provided     Ambulation / Gait / Stairs / Wheelchair Mobility  Ambulation/Gait Ambulation/Gait assistance: Mod assist, +2 safety/equipment Ambulation Distance (Feet): 25 Feet Assistive device: Rolling walker (2 wheeled) Gait Pattern/deviations: Step-through pattern, Trunk flexed, Wide base of support, Antalgic General Gait Details: did not attempt due to instability  Gait velocity: Decreased Gait velocity interpretation: Below normal speed for age/gender    Posture / Balance Dynamic Sitting Balance Sitting balance - Comments: able to don socks without LOB while sitting EOB  Balance Overall balance assessment: Needs assistance Sitting-balance support: No upper extremity supported, Feet supported Sitting balance-Leahy Scale: Good Sitting balance - Comments: able to don socks without LOB while sitting EOB  Postural control: Posterior  lean Standing balance support: Single extremity supported Standing balance-Leahy Scale: Poor Standing balance comment: UE support and min A for static standing     Special needs/care consideration BiPAP/CPAP No CPM No Continuous Drip IV No Dialysis No      Life Vest No Oxygen No Special Bed No Trach Size No Wound Vac (area) No    Skin No                         Bowel mgmt: Last BM 07/24/17 Bladder mgmt: External catheter Diabetic mgmt Yes, on insulin at home    Previous Home Environment Living Arrangements: Spouse/significant other  Lives With: Spouse Available Help at Discharge: Family, Available 24 hours/day Type of Home: House Home Layout: Two level, Able to live on main level with bedroom/bathroom, Laundry or work area in basement Alternate Therapist, sports of Steps: flight Home Access: Stairs to enter Entrance Stairs-Rails: None Technical brewer of Steps: 2 Bathroom Shower/Tub: Multimedia programmer: Standard(Comfort height) Bathroom Accessibility: Yes How Accessible: Accessible via walker Home Care Services: No  Discharge Living Setting Plans for Discharge Living Setting: Patient's home, House, Lives with (comment)(Lives with wife.) Type of Home at Discharge: House Discharge Home Layout: Two level, Laundry or work area in basement, Able to live on main level with bedroom/bathroom Alternate Level Stairs-Number of Steps: Flight  Discharge Home Access: Stairs to enter Entrance Stairs-Number of Steps: 2 Does the patient have any problems obtaining your medications?: No  Social/Family/Support Systems Patient Roles: Spouse, Parent(Has a wife, 2 sons and a daughter.) Contact Information: Burlon Centrella - spouse Anticipated Caregiver: wife Anticipated Caregiver's Contact Information: [name] - wife - [phone] Ability/Limitations of Caregiver: Wife can assist. Caregiver Availability: 24/7 Discharge Plan Discussed with Primary Caregiver: Yes Is  Caregiver In Agreement with Plan?: Yes Does Caregiver/Family have Issues with Lodging/Transportation while Pt is in Rehab?: No  Goals/Additional Needs Patient/Family Goal for Rehab: PT/OT supervision goals Expected length of stay: 10-14 days Cultural Considerations: [employer] Dietary Needs: Heart diet, thin liquids Equipment Needs: TBD Pt/Family Agrees to Admission and willing to participate: Yes Program Orientation Provided & Reviewed with Pt/Caregiver Including Roles  & Responsibilities: Yes  Decrease burden of Care through IP rehab admission: N/A  Possible need for SNF placement upon discharge: Not planned  Patient Condition: This patient's medical and functional status has changed since the consult dated: 07/23/17 in which the Rehabilitation Physician determined and documented that the patient's condition is appropriate for intensive rehabilitative care in an inpatient rehabilitation facility. See "History of Present Illness" (above) for medical update. Functional changes are: Currently requiring min assist for transfers and mod assist for toilet transfer. Patient's medical and functional status update has been discussed with the Rehabilitation physician and patient remains appropriate for inpatient rehabilitation. Will admit to inpatient rehab today.  Preadmission Screen Completed By:  Retta Diones, 07/25/2017 3:29 PM ______________________________________________________________________   Discussed status with Dr. Naaman Plummer on 07/25/17 at 1529 and received telephone approval for admission today.  Admission Coordinator:  Retta Diones, time 1529/Date 07/25/17

## 2017-07-25 NOTE — Progress Notes (Signed)
   07/25/17 0227  What Happened  Was fall witnessed? Yes  Who witnessed fall? Wife  Patients activity before fall bathroom-unassisted  Point of contact buttocks  Was patient injured? No  Follow Up  MD notified  Gaetano Net)  Time MD notified 76  Time family notified 0220 (wife at bedside)  Adult Fall Risk Assessment  Risk Factor Category (scoring not indicated) Fall has occurred during this admission (document High fall risk)  Patient's Fall Risk High Fall Risk (>13 points)  Adult Fall Risk Interventions  Required Bundle Interventions *See Row Information* High fall risk - low, moderate, and high requirements implemented  Additional Interventions Use of appropriate toileting equipment (bedpan, BSC, etc.);Family Supervision (floor mat)  Screening for Fall Injury Risk (To be completed on HIGH fall risk patients who do not meet crieteria for Low Bed) - Assessing Need for Floor Mats Only  Risk For Fall Injury- Criteria for Floor Mats Noncompliant with safety precautions  Will Implement Floor Mats Yes  Vitals  Temp 98.7 F (37.1 C)  Temp Source Oral  BP (!) 177/61  BP Location Right Arm  BP Method Automatic  Patient Position (if appropriate) Sitting  Pulse Rate 84  Pulse Rate Source Dinamap  Resp 18  Oxygen Therapy  SpO2 99 %  O2 Device Room Air

## 2017-07-26 ENCOUNTER — Other Ambulatory Visit: Payer: Self-pay

## 2017-07-26 ENCOUNTER — Encounter (HOSPITAL_COMMUNITY): Payer: Self-pay | Admitting: *Deleted

## 2017-07-26 ENCOUNTER — Inpatient Hospital Stay (HOSPITAL_COMMUNITY)
Admission: RE | Admit: 2017-07-26 | Discharge: 2017-08-09 | DRG: 057 | Disposition: A | Discharge status: Home / Self Care | Payer: Medicare HMO | Source: Intra-hospital | Attending: Physical Medicine & Rehabilitation | Admitting: Physical Medicine & Rehabilitation

## 2017-07-26 DIAGNOSIS — E785 Hyperlipidemia, unspecified: Secondary | ICD-10-CM | POA: Diagnosis present

## 2017-07-26 DIAGNOSIS — I6522 Occlusion and stenosis of left carotid artery: Secondary | ICD-10-CM

## 2017-07-26 DIAGNOSIS — I129 Hypertensive chronic kidney disease with stage 1 through stage 4 chronic kidney disease, or unspecified chronic kidney disease: Secondary | ICD-10-CM | POA: Diagnosis present

## 2017-07-26 DIAGNOSIS — I63511 Cerebral infarction due to unspecified occlusion or stenosis of right middle cerebral artery: Secondary | ICD-10-CM | POA: Diagnosis present

## 2017-07-26 DIAGNOSIS — E1129 Type 2 diabetes mellitus with other diabetic kidney complication: Secondary | ICD-10-CM | POA: Diagnosis present

## 2017-07-26 DIAGNOSIS — G8114 Spastic hemiplegia affecting left nondominant side: Secondary | ICD-10-CM | POA: Diagnosis not present

## 2017-07-26 DIAGNOSIS — N183 Chronic kidney disease, stage 3 unspecified: Secondary | ICD-10-CM | POA: Diagnosis present

## 2017-07-26 DIAGNOSIS — I69354 Hemiplegia and hemiparesis following cerebral infarction affecting left non-dominant side: Principal | ICD-10-CM

## 2017-07-26 DIAGNOSIS — E1122 Type 2 diabetes mellitus with diabetic chronic kidney disease: Secondary | ICD-10-CM | POA: Diagnosis present

## 2017-07-26 DIAGNOSIS — D649 Anemia, unspecified: Secondary | ICD-10-CM | POA: Diagnosis present

## 2017-07-26 DIAGNOSIS — Z833 Family history of diabetes mellitus: Secondary | ICD-10-CM

## 2017-07-26 DIAGNOSIS — Z794 Long term (current) use of insulin: Secondary | ICD-10-CM | POA: Diagnosis not present

## 2017-07-26 DIAGNOSIS — Z79899 Other long term (current) drug therapy: Secondary | ICD-10-CM

## 2017-07-26 DIAGNOSIS — Z87891 Personal history of nicotine dependence: Secondary | ICD-10-CM

## 2017-07-26 DIAGNOSIS — Z886 Allergy status to analgesic agent status: Secondary | ICD-10-CM

## 2017-07-26 DIAGNOSIS — I639 Cerebral infarction, unspecified: Secondary | ICD-10-CM | POA: Diagnosis not present

## 2017-07-26 DIAGNOSIS — I69319 Unspecified symptoms and signs involving cognitive functions following cerebral infarction: Secondary | ICD-10-CM | POA: Diagnosis not present

## 2017-07-26 DIAGNOSIS — I69393 Ataxia following cerebral infarction: Secondary | ICD-10-CM | POA: Diagnosis not present

## 2017-07-26 DIAGNOSIS — I69312 Visuospatial deficit and spatial neglect following cerebral infarction: Secondary | ICD-10-CM | POA: Diagnosis not present

## 2017-07-26 DIAGNOSIS — I10 Essential (primary) hypertension: Secondary | ICD-10-CM | POA: Diagnosis present

## 2017-07-26 DIAGNOSIS — R2689 Other abnormalities of gait and mobility: Secondary | ICD-10-CM | POA: Diagnosis not present

## 2017-07-26 LAB — GLUCOSE, CAPILLARY
GLUCOSE-CAPILLARY: 84 mg/dL (ref 65–99)
GLUCOSE-CAPILLARY: 100 mg/dL — AB (ref 65–99)
Glucose-Capillary: 96 mg/dL (ref 65–99)
Glucose-Capillary: 126 mg/dL — ABNORMAL HIGH (ref 65–99)

## 2017-07-26 MED ORDER — POLYETHYLENE GLYCOL 3350 17 G PO PACK: 17.0000 g | PACK | Freq: Every day | ORAL | Status: DC | PRN

## 2017-07-26 MED ORDER — ACETAMINOPHEN 325 MG PO TABS: 325.0000 mg | ORAL_TABLET | ORAL | Status: DC | PRN

## 2017-07-26 MED ORDER — POLYSACCHARIDE IRON COMPLEX 150 MG PO CAPS
150.0000 mg | ORAL_CAPSULE | Freq: Every day | ORAL | Status: DC
  Administered 2017-07-26 – 2017-08-08 (×14): 150 mg via ORAL
  Filled 2017-07-26 (×14): qty 1

## 2017-07-26 MED ORDER — PROCHLORPERAZINE EDISYLATE 5 MG/ML IJ SOLN: 5.0000 mg | Freq: Four times a day (QID) | INTRAMUSCULAR | Status: DC | PRN

## 2017-07-26 MED ORDER — INSULIN ASPART 100 UNIT/ML ~~LOC~~ SOLN
0.0000 [IU] | Freq: Three times a day (TID) | SUBCUTANEOUS | Status: DC
Start: 2017-07-26 — End: 2017-08-09
  Administered 2017-08-04: 2 [IU] via SUBCUTANEOUS

## 2017-07-26 MED ORDER — ALUM & MAG HYDROXIDE-SIMETH 200-200-20 MG/5ML PO SUSP: 30.0000 mL | ORAL | Status: DC | PRN

## 2017-07-26 MED ORDER — NON FORMULARY: 1.8000 mg | Freq: Every day | Status: DC

## 2017-07-26 MED ORDER — TRAZODONE HCL 50 MG PO TABS: 25.0000 mg | ORAL_TABLET | Freq: Every evening | ORAL | Status: DC | PRN

## 2017-07-26 MED ORDER — LIRAGLUTIDE 18 MG/3ML ~~LOC~~ SOPN
1.8000 mg | PEN_INJECTOR | Freq: Every day | SUBCUTANEOUS | Status: DC
  Administered 2017-07-29 – 2017-08-08 (×11): 1.8 mg via SUBCUTANEOUS
  Filled 2017-07-26 (×2): qty 3

## 2017-07-26 MED ORDER — INSULIN GLARGINE 100 UNIT/ML ~~LOC~~ SOLN
20.0000 [IU] | Freq: Every day | SUBCUTANEOUS | Status: DC
  Administered 2017-07-27 – 2017-08-08 (×13): 20 [IU] via SUBCUTANEOUS
  Filled 2017-07-26 (×14): qty 0.2

## 2017-07-26 MED ORDER — INSULIN GLARGINE 100 UNIT/ML ~~LOC~~ SOLN
8.0000 [IU] | SUBCUTANEOUS | Status: DC
  Administered 2017-07-26 – 2017-07-30 (×5): 8 [IU] via SUBCUTANEOUS
  Filled 2017-07-26 (×6): qty 0.08

## 2017-07-26 MED ORDER — GUAIFENESIN-DM 100-10 MG/5ML PO SYRP: 5.0000 mL | ORAL_SOLUTION | Freq: Four times a day (QID) | ORAL | Status: DC | PRN

## 2017-07-26 MED ORDER — PROCHLORPERAZINE 25 MG RE SUPP: 12.5000 mg | Freq: Four times a day (QID) | RECTAL | Status: DC | PRN

## 2017-07-26 MED ORDER — PROCHLORPERAZINE MALEATE 5 MG PO TABS: 5.0000 mg | ORAL_TABLET | Freq: Four times a day (QID) | ORAL | Status: DC | PRN

## 2017-07-26 MED ORDER — DIPHENHYDRAMINE HCL 12.5 MG/5ML PO ELIX: 12.5000 mg | ORAL_SOLUTION | Freq: Four times a day (QID) | ORAL | Status: DC | PRN

## 2017-07-26 MED ORDER — INSULIN DEGLUDEC 100 UNIT/ML ~~LOC~~ SOPN: 8.0000 [IU] | PEN_INJECTOR | Freq: Every day | SUBCUTANEOUS | Status: DC

## 2017-07-26 MED ORDER — INSULIN ASPART 100 UNIT/ML ~~LOC~~ SOLN: 0.0000 [IU] | Freq: Every day | SUBCUTANEOUS | Status: DC

## 2017-07-26 MED ORDER — ROSUVASTATIN CALCIUM 40 MG PO TABS
40.0000 mg | ORAL_TABLET | Freq: Every day | ORAL | Status: DC
Start: 2017-07-27 — End: 2017-08-09
  Administered 2017-07-27 – 2017-08-09 (×14): 40 mg via ORAL
  Filled 2017-07-26 (×14): qty 1

## 2017-07-26 MED ORDER — BISACODYL 10 MG RE SUPP: 10.0000 mg | Freq: Every day | RECTAL | Status: DC | PRN

## 2017-07-26 MED ORDER — ASPIRIN EC 325 MG PO TBEC
325.0000 mg | DELAYED_RELEASE_TABLET | Freq: Every day | ORAL | Status: DC
  Administered 2017-07-27 – 2017-08-09 (×14): 325 mg via ORAL
  Filled 2017-07-26 (×14): qty 1

## 2017-07-26 MED ORDER — FLEET ENEMA 7-19 GM/118ML RE ENEM: 1.0000 | ENEMA | Freq: Once | RECTAL | Status: DC | PRN

## 2017-07-26 MED ORDER — EZETIMIBE 10 MG PO TABS
10.0000 mg | ORAL_TABLET | Freq: Every day | ORAL | Status: DC
  Administered 2017-07-27 – 2017-08-09 (×14): 10 mg via ORAL
  Filled 2017-07-26 (×15): qty 1

## 2017-07-26 MED ORDER — ENOXAPARIN SODIUM 40 MG/0.4ML ~~LOC~~ SOLN
40.0000 mg | SUBCUTANEOUS | Status: DC
  Administered 2017-07-27 – 2017-08-09 (×14): 40 mg via SUBCUTANEOUS
  Filled 2017-07-26 (×14): qty 0.4

## 2017-07-26 MED ORDER — METOPROLOL SUCCINATE ER 25 MG PO TB24
25.0000 mg | ORAL_TABLET | Freq: Every day | ORAL | Status: DC
  Administered 2017-07-27 – 2017-08-05 (×10): 25 mg via ORAL
  Filled 2017-07-26 (×10): qty 1

## 2017-07-26 MED ORDER — CLOPIDOGREL BISULFATE 75 MG PO TABS
75.0000 mg | ORAL_TABLET | Freq: Every day | ORAL | Status: DC
  Administered 2017-07-27 – 2017-08-09 (×14): 75 mg via ORAL
  Filled 2017-07-26 (×14): qty 1

## 2017-07-26 NOTE — Progress Notes (Signed)
Report called to 4W RN; patient will be ready for transfer to Rehab after eating his lunch.

## 2017-07-26 NOTE — Progress Notes (Signed)
Post Admission Physician Evaluation: 1. Functional deficits secondary  to right PCA/MCA infarct. 2. Patient is admitted to receive collaborative, interdisciplinary care between the physiatrist, rehab nursing staff, and therapy team. 3. Patient's level of medical complexity and substantial therapy needs in context of that medical necessity cannot be provided at a lesser intensity of care such as a SNF. 4. Patient has experienced substantial functional loss from his/her baseline which was documented above under the "Functional History" and "Functional Status" headings.  Judging by the patient's diagnosis, physical exam, and functional history, the patient has potential for functional progress which will result in measurable gains while on inpatient rehab.  These gains will be of substantial and practical use upon discharge  in facilitating mobility and self-care at the household level. 5. Physiatrist will provide 24 hour management of medical needs as well as oversight of the therapy plan/treatment and provide guidance as appropriate regarding the interaction of the two. 6. The Preadmission Screening has been reviewed and patient status is unchanged unless otherwise stated above. 7. 24 hour rehab nursing will assist with bladder management, bowel management, safety, skin/wound care, disease management, medication administration, pain management and patient education  and help integrate therapy concepts, techniques,education, etc. 8. PT will assess and treat for/with: Lower extremity strength, range of motion, stamina, balance, functional mobility, safety, adaptive techniques and equipment, NMR, visual-spatial awareness, family education.   Goals are: supervision . 9. OT will assess and treat for/with: ADL's, functional mobility, safety, upper extremity strength, adaptive techniques and equipment, NMR, visual-spatial awareness, family education.   Goals are: supervision. Therapy may proceed with showering this  patient.. 10. Case Management and Social Worker will assess and treat for psychological issues and discharge planning. 11. Team conference will be held weekly to assess progress toward goals and to determine barriers to discharge. 12. Patient will receive at least 3 hours of therapy per day at least 5 days per week. 13. ELOS: 10-14 days       14. Prognosis:  excellent   Meredith Staggers, MD, [facility] 07/26/2017

## 2017-07-26 NOTE — Progress Notes (Signed)
Patient is ready for transfer; transferred via bed to 4W21; all belongings returned.

## 2017-07-26 NOTE — H&P (Addendum)
Physical Medicine and Rehabilitation Admission H&P       Chief Complaint  Patient presents with  . Functional deficits due to unsteady gait.     HPI:  Eric BROUGHTON a 77 y.o.malewith history of type 2 diabetes mellitus and hyperlipidemia who was admitted on 07/22/2017 with unsteady gait and left-sided weakness times 2 days.CT of head revealed hypodensity parietal and temporal lobes consistent with acute to subacute infarct.CTA head neck showed 40-55% right ICA stenosis 80% left ICA stenosis severe stenosis origin left vertebral artery and moderate stenosis right vertebral artery. MRI of brain done showing large area of acute ischemia within the posterior right hemisphere involving right MCA and small portion right PCA question embolic petechial hemorrhage posterior right MCA distribution and old basal ganglia cerebellar lacunar infarcts.2D echo done showing EF 60-65% no wall abnormalities and grade 1 diastolic dysfunction.EEG was normal study no epileptiform discharges noted.  Dr. Leonie Man feltright MCA territory infarct due to intracranial and extracranial large vessel vessel atherosclerosis--dual antiplatelet recommended and patient to participate in Stroke Afib trial.Carotid dopplers done reveling 40-50% R-ICA stenosis and Left 80-99% ICA stenosis. Encephalopathy has resolved and with recommendations to follow up with vascular after discharge. Patient with resultant left visual field deficits,poor safety awareness,balance deficits and cognitive deficits.CIR recommended due to functional deficits   Review of Systems  Constitutional: Negative for chills and fever.  HENT: Negative for hearing loss and tinnitus.   Eyes: Negative for blurred vision and double vision.  Respiratory: Negative for cough and shortness of breath.   Cardiovascular: Negative for chest pain, palpitations and leg swelling.  Gastrointestinal: Negative for abdominal pain, constipation,  heartburn and nausea.  Genitourinary: Negative for dysuria and urgency.  Musculoskeletal: Negative for back pain and myalgias.  Skin: Negative for rash.  Neurological: Positive for speech change and focal weakness. Negative for dizziness and headaches.  Psychiatric/Behavioral: The patient has insomnia.           Past Medical History:  Diagnosis Date  . Diabetes mellitus without complication ([place])   . Hyperlipidemia          Past Surgical History:  Procedure Laterality Date  . FEMORAL-FEMORAL BYPASS GRAFT Bilateral 2007         Family History  Problem Relation Age of Onset  . Diabetes Sister     Social History:  Married was independent and driving prior to admission. Retired [employer] during the day.Reports that he has quit smoking. he has never used smokeless tobacco. He reports that he does not drink alcohol or use drugs.        Allergies  Allergen Reactions  . Aspirin     Diarrhea          Medications Prior to Admission  Medication Sig Dispense Refill  . amlodipine-benazepril (LOTREL) 2.5-10 MG per capsule Take 1 capsule by mouth daily.   0  . ezetimibe (ZETIA) 10 MG tablet Take 1 tablet (10 mg total) by mouth daily. 30 tablet 3  . insulin aspart (NOVOLOG FLEXPEN) 100 UNIT/ML FlexPen 5-7 units at main meal 15 mL 1  . liraglutide (VICTOZA) 18 MG/3ML SOPN INJECT 0.3 MLS  (1.8 MG TOTAL) INTO THE SKIN ONCE A DAY 9 mL 3  . metoprolol succinate (TOPROL-XL) 25 MG 24 hr tablet Take 25 mg by mouth daily.   0  . rosuvastatin (CRESTOR) 40 MG tablet Take 40 mg by mouth daily.     . TRESIBA FLEXTOUCH 100 UNIT/ML SOPN FlexTouch Pen INJECT 25 UNITS SUBCUTANEOUSLY  ONCE DAILY 15 pen 1  . glucose blood (ONETOUCH VERIO) test strip Use to check blood sugar 2 times per day by rotation of meals. Dx code: E11.9 (Patient not taking: Reported on 07/21/2017) 100 each 3  . RELION PEN NEEDLES 32G X 4 MM MISC USE ONE PEN NEEDLE TWICE DAILY (Patient not  taking: Reported on 07/21/2017) 200 each 2    Drug Regimen Review  Drug regimen was reviewed and remains appropriate with no significant issues identified  Home: Home Living Family/patient expects to be discharged to:: Private residence Living Arrangements: Spouse/significant other Available Help at Discharge: Family, Available 24 hours/day Type of Home: House Home Access: Stairs to enter [employer] of Steps: 2 Entrance Stairs-Rails: None Home Layout: Two level, Able to live on main level with bedroom/bathroom, Laundry or work area in basement Alternate Therapist, sports of Steps: flight Bathroom Shower/Tub: Multimedia programmer: Standard(Comfort height) Bathroom Accessibility: Yes Home Equipment: Radio producer - single point  Lives With: Spouse   Functional History: Prior Function Level of Independence: Independent Comments: ADLs, IADLs, driving, and enjoys writing. has written many books. retired Futures trader Status:  Mobility: Bed Mobility Overal bed mobility: Needs Assistance Bed Mobility: Supine to Sit Supine to sit: Min guard General bed mobility comments: OOB in recliner Transfers Overall transfer level: Needs assistance Equipment used: Rolling walker (2 wheeled) Transfers: Sit to/from Stand Sit to Stand: Min assist Squat pivot transfers: Mod assist General transfer comment: Min A to steady in static standing able to maintain  Ambulation/Gait Ambulation/Gait assistance: Mod assist, +2 physical assistance Ambulation Distance (Feet): 8 Feet(2 x 8 feet) Assistive device: Rolling walker (2 wheeled) Gait Pattern/deviations: Step-to pattern, Decreased weight shift to right, Decreased dorsiflexion - left, Ataxic, Staggering left, Trunk flexed General Gait Details: modAx2 for stabilizing, pt with very poor L sided coordination, difficulty with proprioception of L LE, requiring extensive verbal and tactile cues for movement of L LE, pt exhibited  both overshoot and undershoot of foot placement, difficulty with weight shift for LE advancement. Pt also with difficulty attending to objects on his L.  Gait velocity: Decreased Gait velocity interpretation: Below normal speed for age/gender  ADL: ADL Overall ADL's : Needs assistance/impaired Eating/Feeding: Set up, Sitting, Supervision/ safety Grooming: Set up, Supervision/safety, Sitting Upper Body Bathing: Sitting, Min guard Lower Body Bathing: Sit to/from stand, Moderate assistance Upper Body Dressing : Min guard, Sitting, Cueing for sequencing Upper Body Dressing Details (indicate cue type and reason): Pt donning second gown like jacket. Pt demosntrating poor problem solving and requiring Mod VCs to sequence steps.  Lower Body Dressing: Sit to/from stand, Moderate assistance Lower Body Dressing Details (indicate cue type and reason): assist for balance and to pull pants over hips  Toilet Transfer: Moderate assistance, RW, Ambulation Functional mobility during ADLs: Moderate assistance General ADL Comments: Pt with decreased balance, coordination, cognition, and vision. Pt requiring Mod A for ADLs in standing due to decreased balance. Pt running into the wall, trash can, and chair during funcitonal mobility and required Max cues to change directions and avoid objects.  Cognition: Cognition Overall Cognitive Status: Impaired/Different from baseline Arousal/Alertness: Awake/alert Orientation Level: Oriented X4 Attention: Selective Selective Attention: Impaired Selective Attention Impairment: Verbal basic, Functional basic Memory: Impaired Memory Impairment: Storage deficit, Retrieval deficit Awareness: Impaired Awareness Impairment: Emergent impairment Problem Solving: Impaired Problem Solving Impairment: Verbal complex Executive Function: Sequencing Sequencing: Impaired Safety/Judgment: Impaired Cognition Arousal/Alertness: Awake/alert Behavior During Therapy: Flat affect,  WFL for tasks assessed/performed Overall Cognitive Status: Impaired/Different from  baseline Area of Impairment: Attention, Following commands, Safety/judgement, Awareness, Problem solving Current Attention Level: Sustained Memory: Decreased short-term memory Following Commands: Follows one step commands consistently Safety/Judgement: Decreased awareness of safety Awareness: Emergent Problem Solving: Slow processing, Difficulty sequencing, Requires verbal cues, Requires tactile cues General Comments: Pt able to state his deficits and how they impact his functional performance, but 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   He also states he last practiced 5 years ago "when he was in the [employer] in 1968".   He required max cues to deduct that that time frame was 40 years ago, and that due to Lt sided weakness, his body may not perform as he is accustomed to and his risk for injury is great    Blood pressure (!) 137/57, pulse 78, temperature 98.9 F (37.2 C), temperature source Oral, resp. rate 16, height 5' 10.5" (1.791 m), weight 108.4 kg (238 lb 15.7 oz), SpO2 97 %. Physical Exam  Nursing note and vitals reviewed. Constitutional: He is oriented to person, place, and time. He appears well-developed and well-nourished. No distress.  HENT:  Head: Normocephalic and atraumatic.  Mouth/Throat: Oropharynx is clear and moist.  Eyes: Pupils are equal, round, and reactive to light. Conjunctivae and EOM are normal.  Neck: Normal range of motion. Neck supple.  Cardiovascular: Normal rate and regular rhythm.  Respiratory: Effort normal and breath sounds normal. No stridor. No respiratory distress. He has no wheezes.  GI: Soft. Bowel sounds are normal. He exhibits no distension. There is no tenderness.  Musculoskeletal: He exhibits no edema or tenderness.  Neurological: He is alert and oriented to person, place, and time.  Left field cut with left inattention.  Minimal left  facial weakness with mild dysarthria.  Motor: RUE/RLE: 5/5 proximal to distal LUE: 4+/5 proximal to distal with mild dysmetria LLE: HF, KE 4-4+/5, ADF 3+/5     Skin: Skin is warm and dry. No rash noted. He is not diaphoretic. No erythema.  Psychiatric: He has a normal mood and affect. His behavior is normal. Thought content normal.    LabResultsLast48Hours       Results for orders placed or performed during the hospital encounter of 07/21/17 (from the past 48 hour(s))  Glucose, capillary     Status: Abnormal   Collection Time: 07/23/17  9:42 PM  Result Value Ref Range   Glucose-Capillary 107 (H) 65 - 99 mg/dL  Glucose, capillary     Status: Abnormal   Collection Time: 07/24/17  6:29 AM  Result Value Ref Range   Glucose-Capillary 104 (H) 65 - 99 mg/dL   Comment 1 Notify RN    Comment 2 Document in Chart   Glucose, capillary     Status: Abnormal   Collection Time: 07/24/17 11:35 AM  Result Value Ref Range   Glucose-Capillary 163 (H) 65 - 99 mg/dL  Glucose, capillary     Status: None   Collection Time: 07/24/17  4:07 PM  Result Value Ref Range   Glucose-Capillary 86 65 - 99 mg/dL  Glucose, capillary     Status: None   Collection Time: 07/24/17  9:51 PM  Result Value Ref Range   Glucose-Capillary 83 65 - 99 mg/dL  Glucose, capillary     Status: None   Collection Time: 07/25/17  6:04 AM  Result Value Ref Range   Glucose-Capillary 73 65 - 99 mg/dL  Glucose, capillary     Status: Abnormal   Collection Time: 07/25/17  11:20 AM  Result Value Ref Range   Glucose-Capillary 115 (H) 65 - 99 mg/dL   Comment 1 Notify RN    Comment 2 Document in Chart   Glucose, capillary     Status: Abnormal   Collection Time: 07/25/17  4:34 PM  Result Value Ref Range   Glucose-Capillary 130 (H) 65 - 99 mg/dL   Comment 1 Notify RN    Comment 2 Document in Chart      ImagingResults(Last48hours)  No results found.       Medical Problem List and  Plan: 1.    Left hemiparesis secondary to right posterior MCA infarct.CIR 2.  DVT Prophylaxis/Anticoagulation: Pharmaceutical: Lovenox 3. Pain Management: N/A 4. Mood: LCSW to follow for evaluation and support.  5. Neuropsych: This patient is capable of making decisions on his own behalf. 6. Skin/Wound Care: routine pressure relief measures.  7. Fluids/Electrolytes/Nutrition: Monitor I/O. Offer supplements prn if intake poor.  8. HTN: Monitor BP bid. Continue toprol XL 9.T2DM: Hgb A1C-  7.6. Used Lantus with breakfast, Victoza with lunch and [place] with supper. Wife to bring in meds. . Monitor BS ac/hs and continue Lantus with SSI.  10. CKD stage III: Recheck lytes in am.  11. Normocytic anemia: Monitor --will add iron supplement.  12. Hyperlipidemia: On Crestor and zetia.

## 2017-07-27 ENCOUNTER — Other Ambulatory Visit: Payer: Self-pay

## 2017-07-27 ENCOUNTER — Inpatient Hospital Stay (HOSPITAL_COMMUNITY): Payer: Medicare HMO

## 2017-07-27 LAB — COMPREHENSIVE METABOLIC PANEL
ALBUMIN: 3.3 g/dL — AB (ref 3.5–5.0)
ALT: 22 U/L (ref 17–63)
ANION GAP: 9 (ref 5–15)
AST: 24 U/L (ref 15–41)
Alkaline Phosphatase: 105 U/L (ref 38–126)
BILIRUBIN TOTAL: 0.5 mg/dL (ref 0.3–1.2)
BUN: 15 mg/dL (ref 6–20)
CO2: 26 mmol/L (ref 22–32)
Calcium: 9.1 mg/dL (ref 8.9–10.3)
Chloride: 102 mmol/L (ref 101–111)
Creatinine, Ser: 1.51 mg/dL — ABNORMAL HIGH (ref 0.61–1.24)
GFR calc Af Amer: 49 mL/min — ABNORMAL LOW (ref >60)
GFR, EST NON AFRICAN AMERICAN: 42 mL/min — AB (ref >60)
GLUCOSE: 113 mg/dL — AB (ref 65–99)
POTASSIUM: 3.8 mmol/L (ref 3.5–5.1)
Sodium: 137 mmol/L (ref 135–145)
TOTAL PROTEIN: 7.4 g/dL (ref 6.5–8.1)

## 2017-07-27 LAB — CBC WITH DIFFERENTIAL/PLATELET
BASOS ABS: 0 10*3/uL (ref 0.0–0.1)
BASOS PCT: 0 %
EOS ABS: 0.3 10*3/uL (ref 0.0–0.7)
EOS PCT: 3 %
HEMATOCRIT: 37 % — AB (ref 39.0–52.0)
Hemoglobin: 11.8 g/dL — ABNORMAL LOW (ref 13.0–17.0)
Lymphocytes Relative: 32 %
Lymphs Abs: 2.8 10*3/uL (ref 0.7–4.0)
MCH: 29.7 pg (ref 26.0–34.0)
MCHC: 31.9 g/dL (ref 30.0–36.0)
MCV: 93.2 fL (ref 78.0–100.0)
MONO ABS: 0.9 10*3/uL (ref 0.1–1.0)
Monocytes Relative: 10 %
NEUTROS ABS: 4.7 10*3/uL (ref 1.7–7.7)
Neutrophils Relative %: 55 %
PLATELETS: 276 10*3/uL (ref 150–400)
RBC: 3.97 MIL/uL — ABNORMAL LOW (ref 4.22–5.81)
RDW: 14 % (ref 11.5–15.5)
WBC: 8.7 10*3/uL (ref 4.0–10.5)

## 2017-07-27 LAB — GLUCOSE, CAPILLARY
GLUCOSE-CAPILLARY: 97 mg/dL (ref 65–99)
GLUCOSE-CAPILLARY: 108 mg/dL — AB (ref 65–99)
GLUCOSE-CAPILLARY: 73 mg/dL (ref 65–99)
Glucose-Capillary: 107 mg/dL — ABNORMAL HIGH (ref 65–99)

## 2017-07-27 NOTE — Evaluation (Signed)
Physical Therapy Assessment and Plan  Patient Details  Name: Eric Long MRN: [id number] Date of Birth: [birth year]  PT Diagnosis: Abnormal posture, Ataxia, Coordination disorder, Difficulty walking and Impaired sensation Rehab Potential: Good ELOS: 12-14 days   Today's Date: 07/27/2017 PT Individual Time: 1101-1200 PT Individual Time Calculation (min): 59 min    Problem List:  Patient Active Problem List   Diagnosis Date Noted  . Acute ischemic right MCA stroke ([place]) 07/26/2017  . Hyperlipidemia 07/23/2017  . Diastolic dysfunction   . Acute blood loss anemia   . Essential hypertension 07/22/2017  . Type 2 diabetes mellitus with renal manifestations ([place]) 07/22/2017  . Acute CVA (cerebrovascular accident) ([place]) 07/21/2017  . Chronic kidney disease, stage III (moderate) ([place]) 02/19/2013  . Atherosclerotic peripheral vascular disease ([place]) 02/19/2013  . Pure hypercholesterolemia 02/19/2013  . Diabetes mellitus, stable ([place]) 02/05/2013    Past Medical History:  Past Medical History:  Diagnosis Date  . Diabetes mellitus without complication ([place])   . Hyperlipidemia    Past Surgical History:  Past Surgical History:  Procedure Laterality Date  . FEMORAL-FEMORAL BYPASS GRAFT Bilateral 2007    Assessment & Plan Clinical Impression: Patient is a 77 y.o. year old male with history of type 2 diabetes mellitus and hyperlipidemia who was admitted on 07/22/2017 with unsteady gait and left-sided weakness times 2 days.CT of headrevealed hypodensity parietal and temporal lobes consistent with acute to subacute infarct.CTA head neck showed 40-55% right ICA stenosis 80% left ICA stenosis severe stenosis origin left vertebral artery and moderate stenosis right vertebral artery. MRI of brain done showing large area of acute ischemia within the posterior right hemisphere involving right MCA and small portion right PCA question embolic petechial hemorrhage posterior right MCA distribution  and old basal ganglia cerebellar lacunar infarcts.2D echo done showing EF 60-65% no wall abnormalities and grade 1 diastolic dysfunction.EEG was normal study no epileptiform discharges noted.  Dr. Leonie Man feltright MCA territory infarct due to intracranial and extracranial large vessel vessel atherosclerosis--dual antiplatelet recommended and patient to participate in Stroke Afib trial.Carotid dopplers done reveling 40-50% R-ICA stenosis and Left 80-99% ICA stenosis.Encephalopathy has resolved and with recommendations to follow up with vascular after discharge. Patientwith resultant left visual field deficits,poor safety awareness,balance deficits and cognitive deficits.CIR recommended due to functional deficits. Patient transferred to CIR on 07/26/2017 .   Patient currently requires mod with mobility secondary to muscle weakness, ataxia and decreased coordination, decreased attention and decreased awareness and decreased standing balance, decreased postural control and decreased balance strategies.  Prior to hospitalization, patient was independent  with mobility and lived with Spouse in a House home.  Home access is 2Stairs to enter.  Patient will benefit from skilled PT intervention to maximize safe functional mobility, minimize fall risk and decrease caregiver burden for planned discharge home with 24 hour supervision.  Anticipate patient will benefit from follow up OP at discharge.  PT - End of Session Activity Tolerance: Improving;Tolerates 30+ min activity with multiple rests Endurance Deficit: Yes PT Assessment Rehab Potential (ACUTE/IP ONLY): Good PT Patient demonstrates impairments in the following area(s): Balance;Behavior;Endurance;Motor;Pain;Safety;Perception;Sensory PT Transfers Functional Problem(s): Bed Mobility;Bed to Chair;Car;Furniture;Floor PT Locomotion Functional Problem(s): Stairs;Wheelchair Mobility;Ambulation PT Plan PT Intensity: Minimum of 1-2 x/day ,45 to 90  minutes PT Frequency: 5 out of 7 days PT Duration Estimated Length of Stay: 12-14 days PT Treatment/Interventions: Ambulation/gait training;Discharge planning;Functional mobility training;Therapeutic Activities;Balance/vestibular training;Neuromuscular re-education;Therapeutic Exercise;Wheelchair propulsion/positioning;Cognitive remediation/compensation;DME/adaptive equipment instruction;Pain management;Splinting/orthotics;UE/LE Strength taining/ROM;Community reintegration;Functional electrical stimulation;Patient/family education;Stair training;UE/LE Coordination activities PT Transfers Anticipated  Outcome(s): supervision PT Locomotion Anticipated Outcome(s): supervision  PT Recommendation Follow Up Recommendations: Outpatient PT Patient destination: Home Equipment Recommended: To be determined  Skilled Therapeutic Intervention Evaluation completed (see details above and below) with education on PT POC and goals and individual treatment initiated with focus on functional transfers, safety, ambulation, and stairs. Pt propelled w/c from room>gym with instructions for B LE use, working on coordination and strength, x 150 ft. Pt ambulated x 80 ft with RW and mod assist, ataxic movement noted during gait with L LE, verbal cues for foot placement and awareness. Pt ascended/descended 1 step with B handrails and mod assist, verbal cues for techniques and placement. Pt transported to ortho gym, performed stand pivot transfer w/c<>car with RW and mod assist, verbal cues for techniques and RW management. Pt performed sit<>stands x 3 without RW, emphasis on techniques and body awareness. Pt performed bed mobility on mat with min assist, increased time and verbal cues for techniques. Pt transported back to room and left seated with needs in reach, family present, QRB in place.     PT Evaluation Precautions/Restrictions Precautions Precaution Comments: Left visual deficits Restrictions Weight Bearing  Restrictions: No Home Living/Prior Functioning Home Living Available Help at Discharge: Family;Available 24 hours/day Type of Home: House Home Access: Stairs to enter [employer] of Steps: 2 Entrance Stairs-Rails: None Home Layout: Two level;Able to live on main level with bedroom/bathroom;Laundry or work area in basement Alternate Therapist, sports of Steps: flight Alternate Level Stairs-Rails: Right;Left  Lives With: Spouse Prior Function Level of Independence: Independent with basic ADLs;Independent with gait  Able to Take Stairs?: Yes Driving: Yes Vocation: Retired Leisure: Hobbies-yes (Comment) Comments: Enjoys writing and building websites. has written many books. retired Technical brewer Overall Cognitive Status: Impaired/Different from baseline Arousal/Alertness: Awake/alert Orientation Level: Oriented X4 Attention: Selective Selective Attention: Impaired Memory: Impaired Safety/Judgment: Impaired Sensation Sensation Light Touch: Impaired by gross assessment Proprioception: Impaired by gross assessment(L LE) Additional Comments: B LE sensation impaired stocking distribution. Impaired proprioception L LE Coordination Gross Motor Movements are Fluid and Coordinated: No Fine Motor Movements are Fluid and Coordinated: No Motor  Motor Motor: Hemiplegia Motor - Skilled Clinical Observations: mild L hemiparesis  Trunk/Postural Assessment  Cervical Assessment Cervical Assessment: Exceptions to WFL(forward head posture) Thoracic Assessment Thoracic Assessment: Exceptions to WFL(rounded shoulder) Lumbar Assessment Lumbar Assessment: Exceptions to WFL(posterior pelvic tilt) Postural Control Postural Control: Deficits on evaluation  Balance Balance Balance Assessed: Yes Extremity Assessment  RLE Assessment RLE Assessment: Within Functional Limits LLE Assessment LLE Assessment: Exceptions to [facility] LLE Strength Left Hip Flexion: 4/5 Left Hip  Extension: 4/5 Left Knee Flexion: 4/5 Left Knee Extension: 4/5 Left Ankle Dorsiflexion: 3+/5 Left Ankle Plantar Flexion: 3+/5   See Function Navigator for Current Functional Status.   Refer to Care Plan for Long Term Goals  Recommendations for other services: Therapeutic Recreation  Outing/community reintegration  Discharge Criteria: Patient will be discharged from PT if patient refuses treatment 3 consecutive times without medical reason, if treatment goals not met, if there is a change in medical status, if patient makes no progress towards goals or if patient is discharged from hospital.  The above assessment, treatment plan, treatment alternatives and goals were discussed and mutually agreed upon: by patient and by family  Netta Corrigan, PT, DPT 07/27/2017, 12:10 PM

## 2017-07-27 NOTE — Evaluation (Signed)
Occupational Therapy Assessment and Plan  Patient Details  Name: Eric Long MRN: [id number] Date of Birth: July 27, 1940  OT Diagnosis: abnormal posture, apraxia, cognitive deficits, disturbance of vision, hemiplegia affecting non-dominant side, muscle weakness (generalized) and coordination disorder Rehab Potential: Rehab Potential (ACUTE ONLY): Good ELOS: 10-14   Today's Date: 07/27/2017 OT Individual Time: 0700-0800 OT Individual Time Calculation (min): 60 min     Problem List:  Patient Active Problem List   Diagnosis Date Noted  . Acute ischemic right MCA stroke ([place]) 07/26/2017  . Hyperlipidemia 07/23/2017  . Diastolic dysfunction   . Acute blood loss anemia   . Essential hypertension 07/22/2017  . Type 2 diabetes mellitus with renal manifestations ([place]) 07/22/2017  . Acute CVA (cerebrovascular accident) ([place]) 07/21/2017  . Chronic kidney disease, stage III (moderate) ([place]) 02/19/2013  . Atherosclerotic peripheral vascular disease ([place]) 02/19/2013  . Pure hypercholesterolemia 02/19/2013  . Diabetes mellitus, stable ([place]) 02/05/2013    Past Medical History:  Past Medical History:  Diagnosis Date  . Diabetes mellitus without complication ([place])   . Hyperlipidemia    Past Surgical History:  Past Surgical History:  Procedure Laterality Date  . FEMORAL-FEMORAL BYPASS GRAFT Bilateral 2007    Assessment & Plan Clinical Impression: 77 y.o. male with a history of diabetes, hyperlipidemia who presents with unsteady gait. MRI: Large area of acute ischemia within the posterior right hemisphere, involving the posterior right MCA territory and a small portion of the right PCA territory.    Patient currently requires min- mod with basic self-care skills secondary to muscle weakness, decreased cardiorespiratoy endurance, decreased coordination and decreased motor planning, decreased visual perceptual skills and field cut, decreased attention to left, decreased awareness, decreased  problem solving and decreased safety awareness and decreased standing balance, decreased postural control, hemiplegia and decreased balance strategies.  Prior to hospitalization, patient could complete BADL with independent .  Patient will benefit from skilled intervention to decrease level of assist with basic self-care skills and increase independence with basic self-care skills prior to discharge home with care partner.  Anticipate patient will require 24 hour supervision and follow up home health and follow up outpatient.  OT - End of Session Activity Tolerance: Tolerates 30+ min activity with multiple rests Endurance Deficit: Yes OT Assessment Rehab Potential (ACUTE ONLY): Good OT Patient demonstrates impairments in the following area(s): Balance;Cognition;Endurance;Motor;Perception;Safety;Sensory;Vision OT Basic ADL's Functional Problem(s): Grooming;Dressing;Bathing;Toileting OT Transfers Functional Problem(s): Toilet;Tub/Shower OT Additional Impairment(s): Fuctional Use of Upper Extremity OT Plan OT Intensity: Minimum of 1-2 x/day, 45 to 90 minutes OT Frequency: 5 out of 7 days OT Duration/Estimated Length of Stay: 10-14 OT Treatment/Interventions: Balance/vestibular training;Discharge planning;Pain management;Self Care/advanced ADL retraining;Therapeutic Activities;UE/LE Coordination activities;Cognitive remediation/compensation;Disease mangement/prevention;Functional mobility training;Patient/family education;Skin care/wound managment;Therapeutic Exercise;Visual/perceptual remediation/compensation;Community reintegration;DME/adaptive equipment instruction;Neuromuscular re-education;Psychosocial support;UE/LE Strength taining/ROM;Wheelchair propulsion/positioning OT Self Feeding Anticipated Outcome(s): MOD I OT Basic Self-Care Anticipated Outcome(s): S OT Toileting Anticipated Outcome(s): S OT Bathroom Transfers Anticipated Outcome(s): S OT Recommendation Patient destination:  Home Follow Up Recommendations: Home health OT Equipment Recommended: 3 in 1 bedside comode;To be determined;Tub/shower bench   Skilled Therapeutic Intervention 1:1. Pt awake upon arrival with no c/o pain. Pt demo L inattention throughout session as demonstrated by forgetting to doff gown on L side and neglecting to squeeze OT fingers during grip test with LUE until cued. Pt supine>sitting with touching A for trunk elevation. Pt stand pivot transfer from RW with MOD A for lifting from bed level with VC for hand placement. Pt bathes at sink at sit to stand  level with A to bathe back and buttocks. Pt unable to locate clothing hanging on walker without MOD VC to look to L visual field. Pt dons pull over shirt with S and pants with VC for orientation. Pt threads BLE into 1 pant leg and attempts to advance pants past hips. Pt unable to understand why pants not able to be pulled up without cueing from OT. Pt unable to reorient pants d/t visio spatial deficits. OT reorients clothing and Pt able to thread BLE and don socks in seated figure 4 with max cues. Exited session with pt seated in w/c, call light in reach, chair alarm on and all needs met.  OT Evaluation Precautions/Restrictions  Precautions Precautions: Fall;Other (comment) Precaution Comments: Left visual deficits Restrictions Weight Bearing Restrictions: No General Chart Reviewed: Yes Vital Signs Therapy Vitals Temp: (!) 97.4 F (36.3 C) Temp Source: Oral Pulse Rate: 71 Resp: 18 BP: (!) 162/67 Patient Position (if appropriate): Sitting Oxygen Therapy SpO2: 94 % O2 Device: Room Air Pain Pain Assessment Pain Score: 0-No pain Home Living/Prior Functioning Home Living Family/patient expects to be discharged to:: Private residence Living Arrangements: Spouse/significant other Available Help at Discharge: Family, Available 24 hours/day Type of Home: House Home Access: Stairs to enter Technical brewer of Steps: 2 Entrance  Stairs-Rails: None Home Layout: Two level, Able to live on main level with bedroom/bathroom, Laundry or work area in basement Alternate Therapist, sports of Steps: flight Bathroom Shower/Tub: Multimedia programmer: Associate Professor Accessibility: Yes  Lives With: Spouse IADL History Current License: Yes Education: Chief Financial Officer Occupation: Retired 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   Vision Baseline Vision/History: Wears glasses Wears Glasses: At all times Patient Visual Report: No change from baseline Vision Assessment?: Yes;Vision impaired- to be further tested in functional context Tracking/Visual Pursuits: Decreased smoothness of eye movement to LEFT superior field;Decreased smoothness of eye movement to RIGHT inferior field;Decreased smoothness of horizontal tracking;Decreased smoothness of vertical tracking;Requires cues, head turns, or add eye shifts to track Saccades: Decreased speed of saccadic movement;Impaired - to be further tested in functional context Convergence: Impaired (comment) Visual Fields: Left visual field deficit Additional Comments: L inattention and HH Perception  Perception: Impaired(L inattention) Praxis Praxis: Intact Cognition Overall Cognitive Status: Impaired/Different from baseline Arousal/Alertness: Awake/alert Orientation Level: Person;Place;Situation Year: 2019 Month: March Day of Week: Correct Memory: Impaired Immediate Memory Recall: Sock;Blue;Bed Memory Recall: Sock;Blue;Bed Sensation Sensation Light Touch: Impaired by gross assessment Proprioception: Impaired by gross assessment Coordination Gross Motor Movements are Fluid and Coordinated: No Fine Motor Movements are Fluid and Coordinated: No Motor  Motor Motor: Hemiplegia Motor - Skilled  Clinical Observations: mild Mobility  Transfers Transfers: Sit to Stand Sit to Stand: 3: Mod assist Sit to Stand Details: Verbal cues for technique;Verbal cues for precautions/safety;Verbal cues for sequencing;Visual cues/gestures for sequencing  Trunk/Postural Assessment  Cervical Assessment Cervical Assessment: Exceptions to WFL(head forward) Thoracic Assessment Thoracic Assessment: Exceptions to WFL(rounded shoulders) Lumbar Assessment Lumbar Assessment: Exceptions to WFL(flat back) Postural Control Postural Control: Deficits on evaluation(delayed)  Balance Balance Balance Assessed: Yes Dynamic Sitting Balance Sitting balance - Comments: able to balance edge of the recliner without UE support Dynamic Standing Balance Dynamic Standing - Level of Assistance: 4: Min assist Dynamic Standing - Comments: dressing Extremity/Trunk Assessment RUE Assessment RUE Assessment: Within Functional Limits LUE Assessment LUE Assessment: Exceptions to [facility] 3-/5; generalized weakness; decreased proprioception)  See Function Navigator for Current Functional Status.   Refer to Care Plan for Long Term Goals  Recommendations for other services: Therapeutic Recreation  Pet therapy, Stress management and Outing/community reintegration   Discharge Criteria: Patient will be discharged from OT if patient refuses treatment 3 consecutive times without medical reason, if treatment goals not met, if there is a change in medical status, if patient makes no progress towards goals or if patient is discharged from hospital.  The above assessment, treatment plan, treatment alternatives and goals were discussed and mutually agreed upon: by patient  Tonny Branch 07/27/2017, 8:02 AM

## 2017-07-27 NOTE — Progress Notes (Signed)
Eric Long is a 77 y.o. male Sep 13, 1940 [id number]  Subjective: No new complaints. No new problems. Slept well. Feeling OK.  Objective: Vital signs in last 24 hours: Temp:  [97.4 F (36.3 C)] 97.4 F (36.3 C) (03/24 0447) Pulse Rate:  [71] 71 (03/24 0447) Resp:  [18] 18 (03/24 0447) BP: (162)/(67) 162/67 (03/24 0447) SpO2:  [94 %] 94 % (03/24 0447) Weight:  [226 lb 4.8 oz (102.6 kg)] 226 lb 4.8 oz (102.6 kg) (03/23 1300) Weight change:  Last BM Date: 07/26/17  Intake/Output from previous day: 03/23 0701 - 03/24 0700 In: 240 [P.O.:240] Out: 1200 [Urine:1200] Last cbgs: CBG (last 3)  Recent Labs    07/26/17 2116 07/27/17 0629 07/27/17 1203  GLUCAP 126* 97 108*     Physical Exam General: No apparent distress   HEENT: not dry Lungs: Normal effort. Lungs clear to auscultation, no crackles or wheezes. Cardiovascular: Regular rate and rhythm, no edema Abdomen: S/NT/ND; BS(+) Musculoskeletal:  unchanged Neurological: No new neurological deficits Wounds: N/A    Skin: clear  Aging changes Mental state: Alert, oriented, cooperative In a wheelchair    Lab Results: BMET    Component Value Date/Time   NA 137 07/26/2017 2357   K 3.8 07/26/2017 2357   CL 102 07/26/2017 2357   CO2 26 07/26/2017 2357   GLUCOSE 113 (H) 07/26/2017 2357   BUN 15 07/26/2017 2357   CREATININE 1.51 (H) 07/26/2017 2357   CREATININE 1.48 (H) 03/08/2014 0818   CALCIUM 9.1 07/26/2017 2357   GFRNONAA 42 (L) 07/26/2017 2357   GFRAA 49 (L) 07/26/2017 2357   CBC    Component Value Date/Time   WBC 8.7 07/26/2017 2357   RBC 3.97 (L) 07/26/2017 2357   HGB 11.8 (L) 07/26/2017 2357   HCT 37.0 (L) 07/26/2017 2357   PLT 276 07/26/2017 2357   MCV 93.2 07/26/2017 2357   MCH 29.7 07/26/2017 2357   MCHC 31.9 07/26/2017 2357   RDW 14.0 07/26/2017 2357   LYMPHSABS 2.8 07/26/2017 2357   MONOABS 0.9 07/26/2017 2357   EOSABS 0.3 07/26/2017 2357   BASOSABS 0.0 07/26/2017 2357     Studies/Results: No results found.  Medications: I have reviewed the patient's current medications.  Assessment/Plan:  1.  Status post right posterior MCA infarct.  Left hemiparesis-CIR. 2.  DVT prophylaxis with Lovenox 3.  Pain management-Tylenol as needed 4.  Skin care-routine pressure relief measures 5.  Hypertension.  Continue Toprol-XL 6.  Type 2 diabetes.  Lantus with breakfast, Victoza with lunch, to receive with supper 7.  CKD stage III.  Monitoring labs 8.  Normocytic anemia.  Monitoring.  On iron supplements. Hyperlipidemia.  On Crestor and Zetia     Length of stay, days: 1  Walker Kehr , MD 07/27/2017, 12:45 PM

## 2017-07-28 ENCOUNTER — Inpatient Hospital Stay (HOSPITAL_COMMUNITY): Payer: Medicare HMO

## 2017-07-28 ENCOUNTER — Inpatient Hospital Stay (HOSPITAL_COMMUNITY): Payer: Medicare HMO | Admitting: Occupational Therapy

## 2017-07-28 ENCOUNTER — Inpatient Hospital Stay (HOSPITAL_COMMUNITY): Payer: Medicare HMO | Admitting: Speech Pathology

## 2017-07-28 DIAGNOSIS — I639 Cerebral infarction, unspecified: Secondary | ICD-10-CM

## 2017-07-28 DIAGNOSIS — G8114 Spastic hemiplegia affecting left nondominant side: Secondary | ICD-10-CM

## 2017-07-28 LAB — GLUCOSE, CAPILLARY
GLUCOSE-CAPILLARY: 78 mg/dL (ref 65–99)
Glucose-Capillary: 78 mg/dL (ref 65–99)
Glucose-Capillary: 89 mg/dL (ref 65–99)
Glucose-Capillary: 103 mg/dL — ABNORMAL HIGH (ref 65–99)

## 2017-07-28 NOTE — Progress Notes (Signed)
Retta Diones, RN  Rehab Admission Coordinator  Physical Medicine and Rehabilitation  PMR Pre-admission  Signed  Date of Service:  07/25/2017 3:13 PM       Related encounter: ED to Hosp-Admission (Discharged) from 07/21/2017 in [facility]      Signed            '[]'$ Hide copied text  '[]'$ Hover for details   PMR Admission Coordinator Pre-Admission Assessment  Patient: Eric Long is an 77 y.o., male MRN: [id number] DOB: [birth year] Height: 5' 10.5" (179.1 cm) Weight: 108.4 kg (238 lb 15.7 oz)                                                                                                                                                  Insurance Information HMO: Yes   PPO:       PCP:       IPA:       80/20:       OTHER:   PRIMARY:  [employer] medicare      Policy#:  [id number]      Subscriber: Bill Salinas CM Name: Vaughan Basta      Phone#: [phone]     Fax#: [phone] Pre-Cert#: [id number] for 5 days with update due on 07/29/17      Employer: Retired Benefits:  Phone #: [phone]     Name:  Deveron Furlong. Date: 05/06/17     Deduct:  $0      Out of Pocket Max: $5900 (met $35)      Life Max: N/A CIR: $350/ day with max $1400 per admission      SNF: $0 days 1-20; $164 days 21-100 Outpatient:  Medical necessity     Co-Pay: $40/visit Home Health: 100%      Co-Pay: none DME: 80%     Co-Pay: 20% Providers: in network  Emergency Contact Information         Contact Information    Name Relation Home Work Mobile   Rochelle,[name] Spouse [phone]       Current Medical History  Patient Admitting Diagnosis:  R MCA/PCA infarct  History of Present Illness: A 77 y.o.malewith history of type 2 diabetes mellitus and hyperlipidemia who was admitted on 07/22/2017 with unsteady gait and left-sided weakness times 2 days.History taken from chart review, patient, and family.CT of headreviewed, suggesting right infarct. Per report,hypodensity parietal and  temporal lobes consistent with acute to subacute infarct.CTA head neck showed 40-55% right ICA stenosis 80% left ICA stenosis severe stenosis origin left vertebral artery and moderate stenosis right vertebral artery. MRI of brain done showing large area of acute ischemia within the posterior right hemisphere involving right MCA and small portion right PCA question embolic petechial hemorrhage posterior right MCA distribution and old basal ganglia cerebellar lacunar infarcts.2D echo done showing EF 60-65% no wall abnormalities and  grade 1 diastolic dysfunction.EEG was normal study no epileptiform discharges noted.  Dr. Leonie Man feltright MCA territory infarct due to intracranial and extracranial large vessel vessel atherosclerosis--dual antiplatelet recommended and patient to participate in Stroke Afib trial.Carotid dopplers done reveling 40-50% R-ICA stenosis and Left 80-99% ICA stenosis.Patient with resultant left visual field deficits,poor safety awareness,balance deficits and cognitive deficits.CIR recommended due to functional deficits   Total: 4=NIH  Past Medical History      Past Medical History:  Diagnosis Date  . Diabetes mellitus without complication ([place])   . Hyperlipidemia     Family History  family history includes Diabetes in his sister.  Prior Rehab/Hospitalizations:  Has the patient had major surgery during 100 days prior to admission? No  Current Medications   Current Facility-Administered Medications:  .  acetaminophen (TYLENOL) tablet 650 mg, 650 mg, Oral, Q4H PRN **OR** acetaminophen (TYLENOL) solution 650 mg, 650 mg, Per Tube, Q4H PRN **OR** acetaminophen (TYLENOL) suppository 650 mg, 650 mg, Rectal, Q4H PRN, Rise Patience, MD .  aspirin EC tablet 325 mg, 325 mg, Oral, Daily, Rise Patience, MD, 325 mg at 07/25/17 7035 .  clopidogrel (PLAVIX) tablet 75 mg, 75 mg, Oral, Daily, Rinehuls, David L, PA-C, 75 mg at 07/25/17 0923 .   enoxaparin (LOVENOX) injection 40 mg, 40 mg, Subcutaneous, Daily, Rise Patience, MD, 40 mg at 07/25/17 0093 .  ezetimibe (ZETIA) tablet 10 mg, 10 mg, Oral, Daily, Rise Patience, MD, 10 mg at 07/25/17 8182 .  insulin aspart (novoLOG) injection 0-9 Units, 0-9 Units, Subcutaneous, TID WC, Rise Patience, MD, 2 Units at 07/24/17 1207 .  insulin glargine (LANTUS) injection 20 Units, 20 Units, Subcutaneous, Daily, Rise Patience, MD, 20 Units at 07/25/17 [phone] .  metoprolol succinate (TOPROL-XL) 24 hr tablet 25 mg, 25 mg, Oral, Daily, Rise Patience, MD, 25 mg at 07/25/17 1696 .  rosuvastatin (CRESTOR) tablet 40 mg, 40 mg, Oral, Daily, Rise Patience, MD, 40 mg at 07/25/17 7893  Patients Current Diet: Fall precautions Diet Heart Room service appropriate? Yes; Fluid consistency: Thin  Precautions / Restrictions Precautions Precautions: Fall Precaution Comments: Lt inattention and Lt HH  Restrictions Weight Bearing Restrictions: No   Has the patient had 2 or more falls or a fall with injury in the past year?Yes.  Patient reports 3 falls since current CVA.  Prior Activity Level Community (5-7x/wk): Went out 3-4 X a week.  Home Assistive Devices / Equipment Home Assistive Devices/Equipment: CBG Meter, Eyeglasses Home Equipment: Kasandra Knudsen - single point  Prior Device Use: Indicate devices/aids used by the patient prior to current illness, exacerbation or injury? Cane - but did not use it much.  Prior Functional Level Prior Function Level of Independence: Independent Comments: ADLs, IADLs, driving, and enjoys writing. 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  Independent  Indoor Mobility: Did the patient need assistance with walking from room to room (with or without device)? Independent  Stairs: Did the patient need assistance with internal or external stairs  (with or without device)? Independent  Functional Cognition: Did the patient need help planning regular tasks such as shopping or remembering to take medications? Independent  Current Functional Level Cognition  Arousal/Alertness: Awake/alert Overall Cognitive Status: Impaired/Different from baseline Current Attention Level: Sustained Orientation Level: Oriented X4 Following Commands: Follows one step commands consistently Safety/Judgement: Decreased awareness of safety General Comments: Pt able to state his deficits and how  they impact his functional performance, but 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   He also states he last practiced 5 years ago "when he was in the [employer] in 1968".   He required max cues to deduct that that time frame was 40 years ago, and that due to Lt sided weakness, his body may not perform as he is accustomed to and his risk for injury is great  Attention: Selective Selective Attention: Impaired Selective Attention Impairment: Verbal basic, Functional basic Memory: Impaired Memory Impairment: Storage deficit, Retrieval deficit Awareness: Impaired Awareness Impairment: Emergent impairment Problem Solving: Impaired Problem Solving Impairment: Verbal complex Executive Function: Sequencing Sequencing: Impaired Safety/Judgment: Impaired    Extremity Assessment (includes Sensation/Coordination)  Upper Extremity Assessment: Defer to OT evaluation LUE Deficits / Details: WFL strength. Decreased finger opposition and coordination as seen during ADLs and testing. Pt with undershoot and overshooting.  LUE Sensation: WNL(Reporting no sensation deficits) LUE Coordination: decreased fine motor, decreased gross motor  Lower Extremity Assessment: LLE deficits/detail LLE Deficits / Details: With MMT, noted decreased strength in quads, hamstrings and hip flexors.  LLE Sensation: history of peripheral neuropathy(bilaterally, L worse than  R) LLE Coordination: decreased gross motor    ADLs  Overall ADL's : Needs assistance/impaired Eating/Feeding: Set up, Sitting, Supervision/ safety Grooming: Set up, Supervision/safety, Sitting Upper Body Bathing: Sitting, Min guard Lower Body Bathing: Sit to/from stand, Moderate assistance Upper Body Dressing : Min guard, Sitting, Cueing for sequencing Upper Body Dressing Details (indicate cue type and reason): Pt donning second gown like jacket. Pt demosntrating poor problem solving and requiring Mod VCs to sequence steps.  Lower Body Dressing: Sit to/from stand, Moderate assistance Lower Body Dressing Details (indicate cue type and reason): assist for balance and to pull pants over hips  Toilet Transfer: Moderate assistance, RW, Ambulation Functional mobility during ADLs: Moderate assistance General ADL Comments: Pt with decreased balance, coordination, cognition, and vision. Pt requiring Mod A for ADLs in standing due to decreased balance. Pt running into the wall, trash can, and chair during funcitonal mobility and required Max cues to change directions and avoid objects.    Mobility  Overal bed mobility: Needs Assistance Bed Mobility: Supine to Sit Supine to sit: Min guard General bed mobility comments: mod cues to move Lt LE off bed     Transfers  Overall transfer level: Needs assistance Equipment used: 1 person hand held assist Transfers: Sit to/from Stand, Squat Pivot Transfers Sit to Stand: Min assist Squat pivot transfers: Mod assist General transfer comment: assist for balance, and mod cues and mod assist to safely pivot to chair - step by step cues provided     Ambulation / Gait / Stairs / Wheelchair Mobility  Ambulation/Gait Ambulation/Gait assistance: Mod assist, +2 safety/equipment Ambulation Distance (Feet): 25 Feet Assistive device: Rolling walker (2 wheeled) Gait Pattern/deviations: Step-through pattern, Trunk flexed, Wide base of support,  Antalgic General Gait Details: did not attempt due to instability  Gait velocity: Decreased Gait velocity interpretation: Below normal speed for age/gender    Posture / Balance Dynamic Sitting Balance Sitting balance - Comments: able to don socks without LOB while sitting EOB  Balance Overall balance assessment: Needs assistance Sitting-balance support: No upper extremity supported, Feet supported Sitting balance-Leahy Scale: Good Sitting balance - Comments: able to don socks without LOB while sitting EOB  Postural control: Posterior lean Standing balance support: Single extremity supported Standing balance-Leahy Scale: Poor Standing balance comment: UE support and min A  for static standing     Special needs/care consideration BiPAP/CPAP No CPM No Continuous Drip IV No Dialysis No      Life Vest No Oxygen No Special Bed No Trach Size No Wound Vac (area) No    Skin No                         Bowel mgmt: Last BM 07/24/17 Bladder mgmt: External catheter Diabetic mgmt Yes, on insulin at home    Previous Home Environment Living Arrangements: Spouse/significant other  Lives With: Spouse Available Help at Discharge: Family, Available 24 hours/day Type of Home: House Home Layout: Two level, Able to live on main level with bedroom/bathroom, Laundry or work area in basement Alternate Therapist, sports of Steps: flight Home Access: Stairs to enter Entrance Stairs-Rails: None Technical brewer of Steps: 2 Bathroom Shower/Tub: Multimedia programmer: Standard(Comfort height) Bathroom Accessibility: Yes How Accessible: Accessible via walker Beacon: No  Discharge Living Setting Plans for Discharge Living Setting: Patient's home, House, Lives with (comment)(Lives with wife.) Type of Home at Discharge: House Discharge Home Layout: Two level, Laundry or work area in basement, Able to live on main level with bedroom/bathroom Alternate Level  Stairs-Number of Steps: Flight Discharge Home Access: Stairs to enter Technical brewer of Steps: 2 Does the patient have any problems obtaining your medications?: No  Social/Family/Support Systems Patient Roles: Spouse, Parent(Has a wife, 2 sons and a daughter.) Contact Information: Brian Zeitlin - spouse Anticipated Caregiver: wife Anticipated Caregiver's Contact Information: [name] - wife - [phone] Ability/Limitations of Caregiver: Wife can assist. Caregiver Availability: 24/7 Discharge Plan Discussed with Primary Caregiver: Yes Is Caregiver In Agreement with Plan?: Yes Does Caregiver/Family have Issues with Lodging/Transportation while Pt is in Rehab?: No  Goals/Additional Needs Patient/Family Goal for Rehab: PT/OT supervision goals Expected length of stay: 10-14 days Cultural Considerations: Episcopal Dietary Needs: Heart diet, thin liquids Equipment Needs: TBD Pt/Family Agrees to Admission and willing to participate: Yes Program Orientation Provided & Reviewed with Pt/Caregiver Including Roles  & Responsibilities: Yes  Decrease burden of Care through IP rehab admission: N/A  Possible need for SNF placement upon discharge: Not planned  Patient Condition: This patient's medical and functional status has changed since the consult dated: 07/23/17 in which the Rehabilitation Physician determined and documented that the patient's condition is appropriate for intensive rehabilitative care in an inpatient rehabilitation facility. See "History of Present Illness" (above) for medical update. Functional changes are: Currently requiring min assist for transfers and mod assist for toilet transfer. Patient's medical and functional status update has been discussed with the Rehabilitation physician and patient remains appropriate for inpatient rehabilitation. Will admit to inpatient rehab today.  Preadmission Screen Completed By:  Retta Diones, 07/25/2017 3:29  PM ______________________________________________________________________   Discussed status with Dr. Naaman Plummer on 07/25/17 at 1529 and received telephone approval for admission today.  Admission Coordinator:  Retta Diones, time 1529/Date 07/25/17             Cosigned by: Meredith Staggers, MD at 07/25/2017 3:47 PM  Revision History

## 2017-07-28 NOTE — Progress Notes (Signed)
Physical Therapy Session Note  Patient Details  Name: Eric Long MRN: [id number] Date of Birth: [birth year]  Today's Date: 07/28/2017 PT Individual Time: 1301-1415 PT Individual Time Calculation (min): 74 min   Short Term Goals: Week 1:  PT Short Term Goal 1 (Week 1): pt will ambulate with LRAD and min assist 150 ft PT Short Term Goal 2 (Week 1): Pt will ascend/descend 4 steps with min assist PT Short Term Goal 3 (Week 1): Pt will perform bed<>chair transfer with min assist  Skilled Therapeutic Interventions/Progress Updates:    Pt seated in w/c upon PT arrival, agreeable to therapy tx and denies pain. Pt transported from room>gym. Pt worked on dynamic standing balance and coordination to step to target with each LE, x 1 trial with RW for UE support, x 1 trial with 1.5# ankle weight added and x 1 trial with ankle weight and mirror for visual feedback. Pt ambulated 3 x 30 ft with R handrail and mirror for visual feedback, verbal cues for foot placement and tactile cues for upright posture, ataxic gait. Pt worked on squat pivot transfers from w/c<>mat x 3 with verbal cues for techniques and manual facilitation for anterior weightshift. Therapist recommending this method for transfers with nursing secondary to poor awareness and coordination in standing. Pt propelled w/c x 50 ft using B LEs and with supervision, verbal cues for obstacle avoidance and techniques. Pt performed squat pivot transfers from w/c<>bed x 2 with emphasis on weightshifting and scooting techniques. Pt transferred from w/c>recliner lateral scoot with min assist and increased time. Pt left seated in recliner with family present and needs in reach, QRB in place and chair alarm set.   Therapy Documentation Precautions:  Precautions Precautions: Fall, Other (comment) Precaution Comments: Left visual deficits Restrictions Weight Bearing Restrictions: No   See Function Navigator for Current Functional  Status.   Therapy/Group: Individual Therapy  Netta Corrigan, PT, DPT  07/28/2017, 7:55 AM

## 2017-07-28 NOTE — Progress Notes (Signed)
Jamse Arn, MD  Physician  Physical Medicine and Rehabilitation  Consult Note  Signed  Date of Service:  07/23/2017 3:13 PM       Related encounter: ED to Hosp-Admission (Discharged) from 07/21/2017 in [facility]      Signed      Expand All Collapse All       [] Hide copied text  [] Hover for details        Physical Medicine and Rehabilitation Consult   Reason for Consult: Functional deficits due to unsteady gait Referring Physician: Dr. Leonie Man   HPI: Eric Long is a 77 y.o. male with history of type 2 diabetes mellitus and hyperlipidemia who was admitted on 07/22/2017 with unsteady gait and left-sided weakness times 2 days.  History taken from chart review, patient, and family.  CT of head reviewed, suggesting right infarct. Per report, hypodensity parietal and temporal lobes consistent with acute to subacute infarct.  CTA head neck showed 40-55% right ICA stenosis 80% left ICA stenosis severe stenosis origin left vertebral artery and moderate stenosis right vertebral artery.  MRI of brain done showing large area of acute ischemia within the posterior right hemisphere involving right MCA and small portion right PCA question embolic petechial hemorrhage posterior right MCA distribution and old basal ganglia cerebellar lacunar infarcts.  2D echo done showing EF 60-65% no wall abnormalities and grade 1 diastolic dysfunction.  EEG was normal study no epileptiform discharges noted.  Dr. Leonie Man felt right MCA territory infarct due to intracranial and extracranial large vessel vessel atherosclerosis --dual antiplatelet recommended and patient to participate in  Stroke Afib trial.  Patient with resultant left visual field deficits, poor safety awareness, balance deficits and cognitive deficits.  CIR recommended due to functional deficits   Review of Systems  Constitutional: Negative for chills and fever.  HENT: Negative for hearing loss and  tinnitus.   Eyes: Negative for blurred vision and double vision.  Respiratory: Negative for cough and shortness of breath.   Cardiovascular: Negative for chest pain and palpitations.  Gastrointestinal: Negative for heartburn and nausea.  Genitourinary: Negative for dysuria.  Musculoskeletal: Negative for back pain and myalgias.  Skin: Negative for itching and rash.  Neurological: Negative for dizziness, sensory change, speech change, focal weakness and headaches.  Psychiatric/Behavioral: The patient does not have insomnia.   All other systems reviewed and are negative.         Past Medical History:  Diagnosis Date  . Diabetes mellitus without complication ([place])   . Hyperlipidemia          Past Surgical History:  Procedure Laterality Date  . FEMORAL-FEMORAL BYPASS GRAFT Bilateral 2007         Family History  Problem Relation Age of Onset  . Diabetes Sister     Social History: Married was independent and driving prior to admission.  Retired [employer] during the day.  Reports that he has quit smoking. he has never used smokeless tobacco. He reports that he does not drink alcohol or use drugs.          Allergies  Allergen Reactions  . Aspirin     Diarrhea          Medications Prior to Admission  Medication Sig Dispense Refill  . amlodipine-benazepril (LOTREL) 2.5-10 MG per capsule Take 1 capsule by mouth daily.   0  . ezetimibe (ZETIA) 10 MG tablet Take 1 tablet (10 mg total) by mouth daily. 30 tablet 3  . insulin  aspart (NOVOLOG FLEXPEN) 100 UNIT/ML FlexPen 5-7 units at main meal 15 mL 1  . liraglutide (VICTOZA) 18 MG/3ML SOPN INJECT 0.3 MLS  (1.8 MG TOTAL) INTO THE SKIN ONCE A DAY 9 mL 3  . metoprolol succinate (TOPROL-XL) 25 MG 24 hr tablet Take 25 mg by mouth daily.   0  . rosuvastatin (CRESTOR) 40 MG tablet Take 40 mg by mouth daily.     . TRESIBA FLEXTOUCH 100 UNIT/ML SOPN FlexTouch Pen INJECT 25 UNITS SUBCUTANEOUSLY ONCE DAILY  15 pen 1  . glucose blood (ONETOUCH VERIO) test strip Use to check blood sugar 2 times per day by rotation of meals. Dx code: E11.9 (Patient not taking: Reported on 07/21/2017) 100 each 3  . RELION PEN NEEDLES 32G X 4 MM MISC USE ONE PEN NEEDLE TWICE DAILY (Patient not taking: Reported on 07/21/2017) 200 each 2    Home: [place] expects to be discharged to:: Private residence Living Arrangements: Spouse/significant other Available Help at Discharge: Family, Available 24 hours/day Type of Home: House Home Access: Stairs to enter [employer] of Steps: 2 Entrance Stairs-Rails: None Home Layout: Two level, Able to live on main level with bedroom/bathroom, Laundry or work area in basement Alternate Therapist, sports of Steps: flight Bathroom Shower/Tub: Multimedia programmer: Standard(Comfort height) Bathroom Accessibility: Yes Home Equipment: Radio producer - single point  Lives With: Spouse  Functional History: Prior Function Level of Independence: Independent Comments: ADLs, IADLs, driving, and enjoys writing. has written many books. retired Immunologist Status:  Mobility: Bed Mobility Overal bed mobility: Needs Assistance Bed Mobility: Supine to Sit Supine to sit: Min guard General bed mobility comments: Min Guard for safety demonstating WFL strength Transfers Overall transfer level: Needs assistance Equipment used: Rolling walker (2 wheeled) Transfers: Sit to/from Stand Sit to Stand: Min assist, +2 safety/equipment General transfer comment: Min A to steady in static standing. +2 for safety. Noted left lateral lean. Mod A for dynamic balance Ambulation/Gait Ambulation/Gait assistance: Mod assist, +2 safety/equipment Ambulation Distance (Feet): 25 Feet Assistive device: Rolling walker (2 wheeled) Gait Pattern/deviations: Step-through pattern, Trunk flexed, Wide base of support, Antalgic General Gait Details: Antalgic with wide BOS and LLE  external rotation. Pt taking very large steps and at times outside of the walker. Pt running into the walls and objects on the left side and required max cues to correct.  Gait velocity: Decreased Gait velocity interpretation: Below normal speed for age/gender  ADL: ADL Overall ADL's : Needs assistance/impaired Eating/Feeding: Set up, Sitting, Supervision/ safety Grooming: Set up, Supervision/safety, Sitting Upper Body Bathing: Sitting, Min guard Lower Body Bathing: Sit to/from stand, Moderate assistance Upper Body Dressing : Min guard, Sitting, Cueing for sequencing Upper Body Dressing Details (indicate cue type and reason): Pt donning second gown like jacket. Pt demosntrating poor problem solving and requiring Mod VCs to sequence steps.  Lower Body Dressing: Sit to/from stand, Moderate assistance Lower Body Dressing Details (indicate cue type and reason): Pt donning socks at EOB with Min guard due to posterior lean. Pt bringing ankels to knees. Upon arrival, pt with left sock already on. Asked pt to put right sock on and he brought left foot up instead. Required increased time to realize he needed to bring up with right foot. Mod A for dyanmic standing balance Toilet Transfer: Moderate assistance, RW, Ambulation Functional mobility during ADLs: Moderate assistance, +2 for safety/equipment, Rolling walker General ADL Comments: Pt with decreased balance, coordination, cognition, and vision. Pt requiring Mod A for ADLs in  standing due to decreased balance. Pt running into the wall, trash can, and chair during funcitonal mobility and required Max cues to change directions and avoid objects.  Cognition: Cognition Overall Cognitive Status: Impaired/Different from baseline Arousal/Alertness: Awake/alert Orientation Level: Disoriented to time, Oriented to person, Oriented to place, Oriented to situation Attention: Selective Selective Attention: Impaired Selective Attention Impairment: Verbal  basic, Functional basic Memory: Impaired Memory Impairment: Storage deficit, Retrieval deficit Awareness: Impaired Awareness Impairment: Emergent impairment Problem Solving: Impaired Problem Solving Impairment: Verbal complex Executive Function: Sequencing Sequencing: Impaired Safety/Judgment: Impaired Cognition Arousal/Alertness: Awake/alert Behavior During Therapy: Flat affect Overall Cognitive Status: Impaired/Different from baseline Area of Impairment: Attention, Memory, Following commands, Safety/judgement, Awareness, Problem solving Current Attention Level: Sustained Memory: Decreased short-term memory Following Commands: Follows one step commands with increased time Safety/Judgement: Decreased awareness of safety, Decreased awareness of deficits Awareness: Emergent Problem Solving: Slow processing, Decreased initiation, Difficulty sequencing, Requires verbal cues, Requires tactile cues General Comments: Pt presenting with decreased processing and requiring increased time. Pt with decreased problem solving as seen by poor navigation around room. Noted left inattention. Pt walking straight into a wall and then required Max cues to turn away from wall to continue walking.  Blood pressure (!) 142/60, pulse 77, temperature 98.2 F (36.8 C), temperature source Oral, resp. rate 18, height 5' 10.5" (1.791 m), weight 108.4 kg (238 lb 15.7 oz), SpO2 98 %. Physical Exam  Nursing note and vitals reviewed. Constitutional: He is oriented to person, place, and time. He appears well-developed and well-nourished. No distress.  HENT:  Head: Normocephalic and atraumatic.  Eyes: Conjunctivae and EOM are normal. Pupils are equal, round, and reactive to light.  Neck: Normal range of motion. Neck supple.  Cardiovascular: Normal rate and regular rhythm.  Respiratory: Effort normal and breath sounds normal. No stridor. No respiratory distress.  GI: Soft. Bowel sounds are normal. He exhibits no  distension. There is no tenderness.  Musculoskeletal: He exhibits no edema or tenderness.  Neurological: He is alert and oriented to person, place, and time. A cranial nerve deficit is present.  Speech clear.  Able to follow simple one and two step commands without difficulty.   Mild left inattention with lack of awareness of deficits.  Motor: RUE/RLE: 5/5 proximal to distal LUE: 4+/5 proximal to distal with mild dysmetria LLE: HF, KE 4-4+/5, ADF 3+/5  Skin: Skin is warm and dry. No rash noted. He is not diaphoretic. No erythema.  Psychiatric: He is agitated.             Assessment/Plan: Diagnosis: right MCA and small portion right PCA infarct Labs and images independently reviewed.  Records reviewed and summated above. Stroke: Continue secondary stroke prophylaxis and Risk Factor Modification listed below:   Antiplatelet therapy:   Blood Pressure Management:  Continue current medication with prn's with permisive HTN per primary team Statin Agent:   Diabetes management:   Left sided hemiparesis Motor recovery: Fluoxetine  1. Does the need for close, 24 hr/day medical supervision in concert with the patient's rehab needs make it unreasonable for this patient to be served in a less intensive setting? Yes  2. Co-Morbidities requiring supervision/potential complications: diastolic dysfunction (monitor for signs/symptoms of fluid overload), type 2 diabetes mellitus (Monitor in accordance with exercise and adjust meds as necessary), hyperlipidemia (cont meds), CKD (avoid nephrotoxic meds), ABLA (transfuse if necessary to ensure appropriate perfusion for increased activity tolerance), HTN (monitor and provide prns in accordance with increased physical exertion and pain) 3. Due to safety, disease  management and patient education, does the patient require 24 hr/day rehab nursing? Yes 4. Does the patient require coordinated care of a physician, rehab nurse, PT (1-2 hrs/day, 5 days/week) and  OT (1-2 hrs/day, 5 days/week) to address physical and functional deficits in the context of the above medical diagnosis(es)? Yes Addressing deficits in the following areas: balance, endurance, locomotion, strength, transferring, bathing, dressing, toileting and psychosocial support 5. Can the patient actively participate in an intensive therapy program of at least 3 hrs of therapy per day at least 5 days per week? Yes 6. The potential for patient to make measurable gains while on inpatient rehab is excellent 7. Anticipated functional outcomes upon discharge from inpatient rehab are supervision  with PT, supervision with OT, n/a with SLP. 8. Estimated rehab length of stay to reach the above functional goals is: 10-14 days. 9. Anticipated D/C setting: Home 10. Anticipated post D/C treatments: HH therapy and Home excercise program 11. Overall Rehab/Functional Prognosis: good  RECOMMENDATIONS: This patient's condition is appropriate for continued rehabilitative care in the following setting: CIR Patient has agreed to participate in recommended program. Yes Note that insurance prior authorization may be required for reimbursement for recommended care.  Comment: Rehab Admissions Coordinator to follow up.  Delice Lesch, MD, ABPMR Bary Leriche, PA-C 07/23/2017          Revision History                        Routing History

## 2017-07-28 NOTE — Progress Notes (Signed)
Patient information reviewed and entered into eRehab system by Clayden Withem, RN, CRRN, PPS Coordinator.  Information including medical coding and functional independence measure will be reviewed and updated through discharge.     Per nursing patient was given "Data Collection Information Summary for Patients in Inpatient Rehabilitation Facilities with attached "Privacy Act Statement-Health Care Records" upon admission.  

## 2017-07-28 NOTE — Progress Notes (Signed)
Social Work  Social Work Assessment and Plan  Patient Details  Name: AMI THORNSBERRY MRN: [id number] Date of Birth: [birth year]  Today's Date: 07/28/2017  Problem List:  Patient Active Problem List   Diagnosis Date Noted  . Acute ischemic right MCA stroke ([place]) 07/26/2017  . Hyperlipidemia 07/23/2017  . Diastolic dysfunction   . Acute blood loss anemia   . Essential hypertension 07/22/2017  . Type 2 diabetes mellitus with renal manifestations ([place]) 07/22/2017  . Acute CVA (cerebrovascular accident) ([place]) 07/21/2017  . Chronic kidney disease, stage III (moderate) ([place]) 02/19/2013  . Atherosclerotic peripheral vascular disease ([place]) 02/19/2013  . Pure hypercholesterolemia 02/19/2013  . Diabetes mellitus, stable ([place]) 02/05/2013   Past Medical History:  Past Medical History:  Diagnosis Date  . Diabetes mellitus without complication ([place])   . Hyperlipidemia    Past Surgical History:  Past Surgical History:  Procedure Laterality Date  . FEMORAL-FEMORAL BYPASS GRAFT Bilateral 2007   Social History:  reports that he has quit smoking. He has never used smokeless tobacco. He reports that he does not drink alcohol or use drugs.  Family / Support Systems Marital Status: Married Patient Roles: Spouse, Parent Spouse/Significant Other: [name] [phone] Children: Two son's and one daughter Other Supports: Friends and church members Anticipated Caregiver: Wife Ability/Limitations of Caregiver: No issues Caregiver Availability: 24/7 Family Dynamics: Close knit family who are very involved and supportive of one another. Kids and wife have been here for his therapies today to see his progress and therapies. Wife voiced they have good supports and feel very blessed to have everyone around them.  Social History Preferred language: English Religion: African [employer] Cultural Background: No issues Education: Archivist Read: Yes Write: Yes Employment  Status: Retired Freight forwarder Issues: No issues Guardian/Conservator: None-according to MD pt is capable of making his own decisions while here. Wife plans to be here daily and watch his progress in therapies.   Abuse/Neglect Abuse/Neglect Assessment Can Be Completed: Yes Physical Abuse: Denies Verbal Abuse: Denies Sexual Abuse: Denies Exploitation of patient/patient's resources: Denies Self-Neglect: Denies  Emotional Status Pt's affect, behavior adn adjustment status: Pt is motivated to do well and recover from this stroke. He has made progress already and feels positive about this and hopes it will continue while here. He would like to go home soon and get more therapies then.  Recent Psychosocial Issues: Other health issues was doing well before this Pyschiatric History: No history deferred depression screen due to doing well and able to verbalize his concerns and feelings. He feels he will not be here long due to high level. Will get input from team if neuro-psych needed while here. Substance Abuse History: No issues  Patient / Family Perceptions, Expectations & Goals Pt/Family understanding of illness & functional limitations: Pt and family have a good understanding of his stroke and deficits. They have all talked to the MD's and feel their questions have been answered and they are pleased with how well he is doing here. Premorbid pt/family roles/activities: husband, father, grandfather, retiree, church member, friend, etc Anticipated changes in roles/activities/participation: resume Pt/family expectations/goals: Pt states: " I want to be able to do what I was before and not have to rely upon my wife to do for me."  Wife states: " I hope he continues to make progress and do well here."  [employer]: None Premorbid Home Care/DME Agencies: None Transportation available at discharge: Wife and children Resource referrals recommended: Support group  (specify)  Discharge Planning Living Arrangements: Spouse/significant other Support Systems: Spouse/significant other, Children, Friends/neighbors, Church/faith community Type of Residence: Private residence Insurance Resources: Multimedia programmer (specify)([employer] Medicare) Financial Resources: Social Security Financial Screen Referred: No Living Expenses: Own Money Management: Spouse, Patient Does the patient have any problems obtaining your medications?: No Home Management: Both mostly wife Patient/Family Preliminary Plans: Return home with wife who is able to assist with his care, she is in good health. Both she and their children have been here and attended therapies with pt, to see his progress. Aware of team conference and are looking forward to this meeting to find out when he is able to go home.  Social Work Anticipated Follow Up Needs: HH/OP, Support Group  Clinical Impression Pleasant gentleman who is doing well and motivated to recover from his stroke. His wife and children are very involved and supportive and will assist at discharge. Will await Wed team conference and work on discharge needs.  Elease Hashimoto 07/28/2017, 1:59 PM

## 2017-07-28 NOTE — Progress Notes (Signed)
Occupational Therapy Session Note  Patient Details  Name: Eric Long MRN: [id number] Date of Birth: 07/21/1940  Today's Date: 07/28/2017 OT Individual Time: 0947-0962 OT Individual Time Calculation (min): 75 min    Short Term Goals: Week 1:  OT Short Term Goal 1 (Week 1): Pt will stand at isnk for 2 grooming items to demo improved endurance OT Short Term Goal 2 (Week 1): Pt will complete toilet transfer wiht touching A OT Short Term Goal 3 (Week 1): Pt will thread BLE into pants wiht no more than 1 VC for orientation to demonstrate improved L attention/spatial awareness OT Short Term Goal 4 (Week 1): Pt will locate 4/5 objects in far L visual field with no more than min question cues  Skilled Therapeutic Interventions/Progress Updates:    Pt seen for BADL training with a focus on L side awareness, L proprioception, and balance.  Pt received in bed and worked on LLE AROM control, rolling and sidelying to sit. In sitting, LUE coordination exercises to increase proprioception. In his line of sight he has improved awareness of L hand.  Standing ex. With wt shifts,  With mod A and cues due to LLE incoordination/ decreased proprioception.   Pt agreeable to a shower. Attempted a few steps with RW, but it was soon determined it would be safest for a stand pivot to shower.  Getting into his L side he needed max cues for L foot placement/ direction. Once on bench, he was able to actively use L hand to wash R arm. Standing with grab bar using L hand to wash bottom with R hand.  He completed shaving with close S at the sink.  No verbal cues needed to orient shirt and he donned it with s/u only.  For the pants he did initiate the L leg first but needed A to orient pants and then A and cues for orienting pants to don R leg in. He was able to pull pants up in standing 75% of the way and then needed A to align pants.  His daughter was present during the entire session and assisted him with donning his socks  and shoes at the end of the session.  Chair alarm on and dtr in the room with the patient.    Therapy Documentation Precautions:  Precautions Precautions: Fall, Other (comment) Precaution Comments: Left visual deficits Restrictions Weight Bearing Restrictions: No     Pain: Pain Assessment Pain Score: 0-No pain ADL:   See Function Navigator for Current Functional Status.   Therapy/Group: Individual Therapy  SAGUIER,JULIA 07/28/2017, 11:12 AM

## 2017-07-28 NOTE — Discharge Instructions (Signed)
Inpatient Rehab Discharge Instructions  OMAREE FUQUA Discharge date and time:    Activities/Precautions/ Functional Status: Activity: no lifting, driving, or strenuous exercise  till cleared by MD Diet: cardiac diet Wound Care:   Functional status:  ___ No restrictions     ___ Walk up steps independently ___ 24/7 supervision/assistance   ___ Walk up steps with assistance ___ Intermittent supervision/assistance  ___ Bathe/dress independently ___ Walk with walker     ___ Bathe/dress with assistance ___ Walk Independently    ___ Shower independently ___ Walk with assistance    ___ Shower with assistance ___ No alcohol     ___ Return to work/school ________  Special Instructions:  STROKE/TIA DISCHARGE INSTRUCTIONS SMOKING Cigarette smoking nearly doubles your risk of having a stroke & is the single most alterable risk factor  If you smoke or have smoked in the last 12 months, you are advised to quit smoking for your health.  Most of the excess cardiovascular risk related to smoking disappears within a year of stopping.  Ask you doctor about anti-smoking medications  [facility] Quit Line: 1-800-QUIT NOW  Free Smoking Cessation Classes [phone]  CHOLESTEROL Know your levels; limit fat & cholesterol in your diet  Lipid Panel     Component Value Date/Time   CHOL 274 (H) 07/22/2017 0220   TRIG 218 (H) 07/22/2017 0220   HDL 30 (L) 07/22/2017 0220   CHOLHDL 9.1 07/22/2017 0220   VLDL 44 (H) 07/22/2017 0220   LDLCALC 200 (H) 07/22/2017 0220      Many patients benefit from treatment even if their cholesterol is at goal.  Goal: Total Cholesterol (CHOL) less than 160  Goal:  Triglycerides (TRIG) less than 150  Goal:  HDL greater than 40  Goal:  LDL (LDLCALC) less than 100   BLOOD PRESSURE American Stroke Association blood pressure target is less that 120/80 mm/Hg  Your discharge blood pressure is:  BP: (!) 132/56  Monitor your blood pressure  Limit your salt and alcohol  intake  Many individuals will require more than one medication for high blood pressure  DIABETES (A1c is a blood sugar average for last 3 months) Goal HGBA1c is under 7% (HBGA1c is blood sugar average for last 3 months)  Diabetes:     Lab Results  Component Value Date   HGBA1C 7.6 (H) 07/22/2017     Your HGBA1c can be lowered with medications, healthy diet, and exercise.  Check your blood sugar as directed by your physician  Call your physician if you experience unexplained or low blood sugars.  PHYSICAL ACTIVITY/REHABILITATION Goal is 30 minutes at least 4 days per week  Activity: No driving, Therapies: see above Return to work: N/A  Activity decreases your risk of heart attack and stroke and makes your heart stronger.  It helps control your weight and blood pressure; helps you relax and can improve your mood.  Participate in a regular exercise program.  Talk with your doctor about the best form of exercise for you (dancing, walking, swimming, cycling).  DIET/WEIGHT Goal is to maintain a healthy weight  Your discharge diet is:   liquids Your height is:  Height: 5' 10.5" (179.1 cm) Your current weight is: Weight: 108.4 kg (238 lb 15.7 oz) Your Body Mass Index (BMI) is:  BMI (Calculated): 33.79  Following the type of diet specifically designed for you will help prevent another stroke.  Your goal weight is:    Your goal Body Mass Index (BMI) is 19-24.  Healthy  food habits can help reduce 3 risk factors for stroke:  High cholesterol, hypertension, and excess weight.  RESOURCES Stroke/Support Group:  Call [phone]   STROKE EDUCATION PROVIDED/REVIEWED AND GIVEN TO PATIENT Stroke warning signs and symptoms How to activate emergency medical system (call 911). Medications prescribed at discharge. Need for follow-up after discharge. Personal risk factors for stroke. Pneumonia vaccine given:  Flu vaccine given:  My questions have been answered, the writing is legible, and I  understand these instructions.  I will adhere to these goals & educational materials that have been provided to me after my discharge from the hospital.     My questions have been answered and I understand these instructions. I will adhere to these goals and the provided educational materials after my discharge from the hospital.  Patient/Caregiver Signature _______________________________ Date __________  Clinician Signature _______________________________________ Date __________  Please bring this form and your medication list with you to all your follow-up doctor's appointments.

## 2017-07-28 NOTE — Progress Notes (Signed)
Subjective/Complaints:  No issues overnite   ROS- neg CP, SOB, N/V/D  Objective: Vital Signs: Blood pressure (!) 128/98, pulse 69, temperature 97.9 F (36.6 C), temperature source Oral, resp. rate 16, height 5' 10.5" (1.791 m), weight 102.6 kg (226 lb 4.8 oz), SpO2 99 %. No results found. Results for orders placed or performed during the hospital encounter of 07/26/17 (from the past 72 hour(s))  Glucose, capillary     Status: Abnormal   Collection Time: 07/26/17  5:44 PM  Result Value Ref Range   Glucose-Capillary 100 (H) 65 - 99 mg/dL  Glucose, capillary     Status: Abnormal   Collection Time: 07/26/17  9:16 PM  Result Value Ref Range   Glucose-Capillary 126 (H) 65 - 99 mg/dL  CBC WITH DIFFERENTIAL     Status: Abnormal   Collection Time: 07/26/17 11:57 PM  Result Value Ref Range   WBC 8.7 4.0 - 10.5 K/uL   RBC 3.97 (L) 4.22 - 5.81 MIL/uL   Hemoglobin 11.8 (L) 13.0 - 17.0 g/dL   HCT 37.0 (L) 39.0 - 52.0 %   MCV 93.2 78.0 - 100.0 fL   MCH 29.7 26.0 - 34.0 pg   MCHC 31.9 30.0 - 36.0 g/dL   RDW 14.0 11.5 - 15.5 %   Platelets 276 150 - 400 K/uL   Neutrophils Relative % 55 %   Neutro Abs 4.7 1.7 - 7.7 K/uL   Lymphocytes Relative 32 %   Lymphs Abs 2.8 0.7 - 4.0 K/uL   Monocytes Relative 10 %   Monocytes Absolute 0.9 0.1 - 1.0 K/uL   Eosinophils Relative 3 %   Eosinophils Absolute 0.3 0.0 - 0.7 K/uL   Basophils Relative 0 %   Basophils Absolute 0.0 0.0 - 0.1 K/uL    Comment: Performed at [facility], [address] [place], [place] [address]  Comprehensive metabolic panel     Status: Abnormal   Collection Time: 07/26/17 11:57 PM  Result Value Ref Range   Sodium 137 135 - 145 mmol/L   Potassium 3.8 3.5 - 5.1 mmol/L   Chloride 102 101 - 111 mmol/L   CO2 26 22 - 32 mmol/L   Glucose, Bld 113 (H) 65 - 99 mg/dL   BUN 15 6 - 20 mg/dL   Creatinine, Ser 1.51 (H) 0.61 - 1.24 mg/dL   Calcium 9.1 8.9 - 10.3 mg/dL   Total Protein 7.4 6.5 - 8.1 g/dL   Albumin 3.3 (L) 3.5 -  5.0 g/dL   AST 24 15 - 41 U/L   ALT 22 17 - 63 U/L   Alkaline Phosphatase 105 38 - 126 U/L   Total Bilirubin 0.5 0.3 - 1.2 mg/dL   GFR calc non Af Amer 42 (L) >60 mL/min   GFR calc Af Amer 49 (L) >60 mL/min    Comment: (NOTE) The eGFR has been calculated using the CKD EPI equation. This calculation has not been validated in all clinical situations. eGFR's persistently <60 mL/min signify possible Chronic Kidney Disease.    Anion gap 9 5 - 15    Comment: Performed at [facility] [address] [place], [place] [address]  Glucose, capillary     Status: None   Collection Time: 07/27/17  6:29 AM  Result Value Ref Range   Glucose-Capillary 97 65 - 99 mg/dL  Glucose, capillary     Status: Abnormal   Collection Time: 07/27/17 12:03 PM  Result Value Ref Range   Glucose-Capillary 108 (H)  65 - 99 mg/dL  Glucose, capillary     Status: Abnormal   Collection Time: 07/27/17  4:41 PM  Result Value Ref Range   Glucose-Capillary 107 (H) 65 - 99 mg/dL  Glucose, capillary     Status: None   Collection Time: 07/27/17 10:40 PM  Result Value Ref Range   Glucose-Capillary 73 65 - 99 mg/dL  Glucose, capillary     Status: None   Collection Time: 07/28/17  6:19 AM  Result Value Ref Range   Glucose-Capillary 78 65 - 99 mg/dL     HEENT: normal Cardio: RRR and no murmur Resp: CTA B/L and unlabored GI: BS positive and NT, ND Extremity:  No Edema Skin:   Intact Neuro: Alert/Oriented, Abnormal Sensory Reduced sensation LT, Left foot, Abnormal Motor 4/5 Left delt, Bi, Tri, grip, HF, KE, ADF, Abnormal FMC Ataxic/ dec FMC and Inattention Musc/Skel:  Other no pain with UE or LE ROM Gen NAD Left homonymous hemianopsia  Assessment/Plan: 1. Functional deficits secondary to Left hemiparesis and Left homonymous hemianopsia due to R MCA infarct which require 3+ hours per day of interdisciplinary therapy in a comprehensive inpatient rehab setting. Physiatrist is providing close team supervision and 24  hour management of active medical problems listed below. Physiatrist and rehab team continue to assess barriers to discharge/monitor patient progress toward functional and medical goals. FIM: Function - Bathing Position: Wheelchair/chair at sink Body parts bathed by patient: Right arm, Left arm, Chest, Abdomen, Front perineal area, Right upper leg, Left upper leg, Right lower leg, Left lower leg Body parts bathed by helper: Buttocks, Back Assist Level: Touching or steadying assistance(Pt > 75%)  Function- Upper Body Dressing/Undressing What is the patient wearing?: Pull over shirt/dress Pull over shirt/dress - Perfomed by patient: Thread/unthread right sleeve, Thread/unthread left sleeve, Put head through opening, Pull shirt over trunk Function - Lower Body Dressing/Undressing What is the patient wearing?: Non-skid slipper socks, Pants Pants- Performed by patient: Thread/unthread right pants leg, Thread/unthread left pants leg Pants- Performed by helper: Pull pants up/down Non-skid slipper socks- Performed by patient: Don/doff right sock, Don/doff left sock Assist for footwear: Supervision/touching assist Assist for lower body dressing: Touching or steadying assistance (Pt > 75%)  Function - Toileting Toileting activity did not occur: No continent bowel/bladder event  Function - Air cabin crew transfer assistive device: Elevated toilet seat/BSC over toilet(simulated to w/c) Assist level to toilet: Moderate assist (Pt 50 - 74%/lift or lower) Assist level from toilet: Moderate assist (Pt 50 - 74%/lift or lower)  Function - Chair/bed transfer Chair/bed transfer method: Stand pivot Chair/bed transfer assist level: Moderate assist (Pt 50 - 74%/lift or lower) Chair/bed transfer assistive device: Armrests, Walker Chair/bed transfer details: Verbal cues for precautions/safety, Verbal cues for technique, Verbal cues for sequencing, Tactile cues for posture  Function - Locomotion:  Wheelchair Will patient use wheelchair at discharge?: Yes Type: Manual Max wheelchair distance: 150 ft Assist Level: Supervision or verbal cues(B LEs) Assist Level: Supervision or verbal cues Assist Level: Supervision or verbal cues Turns around,maneuvers to table,bed, and toilet,negotiates 3% grade,maneuvers on rugs and over doorsills: No Function - Locomotion: Ambulation Assistive device: Walker-rolling Max distance: 80 ft Assist level: Moderate assist (Pt 50 - 74%) Assist level: Moderate assist (Pt 50 - 74%) Assist level: Moderate assist (Pt 50 - 74%) Walk 150 feet activity did not occur: Safety/medical concerns Walk 10 feet on uneven surfaces activity did not occur: Safety/medical concerns  Function - Comprehension Comprehension: Auditory Comprehension assist level: Follows basic conversation/direction with  no assist  Function - Expression Expression: Verbal Expression assist level: Expresses basic needs/ideas: With no assist  Function - Social Interaction Social Interaction assist level: Interacts appropriately with others - No medications needed.  Function - Problem Solving Problem solving assist level: Solves basic problems with no assist  Function - Memory Patient normally able to recall (first 3 days only): Current season, Staff names and faces, That he or she is in a hospital  Medical Problem List and Plan: 1.   Left hemiparesis, Left homonymous hemianopsia secondary to right posterior MCA infarct.CIR CIR PT, OT,SLP 2. DVT Prophylaxis/Anticoagulation: Pharmaceutical:Lovenox 3. Pain Management:N/A 4. Mood:LCSW to follow for evaluation and support. 5. Neuropsych: This patientiscapable of making decisions on hisown behalf. 6. Skin/Wound Care:routine pressure relief measures. 7. Fluids/Electrolytes/Nutrition:Monitor I/O. Offer supplements prn if intake poor. 8. HTN: Monitor BP bid. Continuetoprol XL Vitals:   07/27/17 1610 07/28/17 0445  BP: (!)  152/52 (!) 128/98  Pulse: 68 69  Resp: 18 16  Temp: (!) 97.5 F (36.4 C) 97.9 F (36.6 C)  SpO2: 97% 99%   9.T2DM: Hgb A1C-7.6. Used Lantus with breakfast,Victozawith lunchand [place] with supper. Wife to bring in meds..Monitor BS ac/hs and continue Lantus with SSI. CBG (last 3)  Recent Labs    07/27/17 1641 07/27/17 2240 07/28/17 0619  GLUCAP 107* 73 78  Controlled 3/25 10. CKDstage LTR:VUYEBXI lytes in am. 11. Normocytic anemia:Monitor --will add iron supplement. 12. Hyperlipidemia: On Crestor and zetia.    LOS (Days) 2 A FACE TO FACE EVALUATION WAS PERFORMED  Charlett Blake 07/28/2017, 8:02 AM

## 2017-07-28 NOTE — Significant Event (Signed)
Patient missed his dose of victoza at lunch today due to medication not being available.  Victoza is a non-formulary medication and since our pharmacy doesn't carry it, they suggested the patient have his family bring their home medication in for him to use during his hospitalization.  Family brought in medication as requested.  According to family, patient self-administered medication yesterday at lunch, then a staff member took possession of the insulin pen and put it in the sharps box by mistake.  I notified Heather Roberts, AD of incident, of which she already knew some about it.  Pharmacy has been contacted to inquire about how we can obtain a replacement insulin pen for the patient since it was lost on our watch.  Awaiting an answer.  Brita Romp, RN

## 2017-07-28 NOTE — Care Management Note (Signed)
[facility] Individual Statement of Services  Patient Name:  Eric Long  Date:  07/28/2017  Welcome to the [facility].  Our goal is to provide you with an individualized program based on your diagnosis and situation, designed to meet your specific needs.  With this comprehensive rehabilitation program, you will be expected to participate in at least 3 hours of rehabilitation therapies Monday-Friday, with modified therapy programming on the weekends.  Your rehabilitation program will include the following services:  Physical Therapy (PT), Occupational Therapy (OT), Speech Therapy (ST), 24 hour per day rehabilitation nursing, Neuropsychology, Case Management (Social Worker), Rehabilitation Medicine, Nutrition Services and Pharmacy Services  Weekly team conferences will be held on Wednesday to discuss your progress.  Your Social Worker will talk with you frequently to get your input and to update you on team discussions.  Team conferences with you and your family in attendance may also be held.  Expected length of stay: 12-14 days  Overall anticipated outcome: supervision set- up  Depending on your progress and recovery, your program may change. Your Social Worker will coordinate services and will keep you informed of any changes. Your Social Worker's name and contact numbers are listed  below.  The following services may also be recommended but are not provided by the [facility] will be made to provide these services after discharge if needed.  Arrangements include referral to agencies that provide these services.  Your insurance has been verified to be:  [employer] Your primary doctor is:  Lona Kettle  Pertinent information will be shared with your doctor and your insurance company.  Social Worker:  Ovidio Kin, [place] or ([phone]  Information discussed with and copy given to patient by: Elease Hashimoto, 07/28/2017, 12:06 PM

## 2017-07-28 NOTE — Evaluation (Signed)
Speech Language Pathology Assessment and Plan  Patient Details  Name: Eric Long MRN: [id number] Date of Birth: 03-Dec-1940  SLP Diagnosis: Cognitive Impairments  Rehab Potential: Good ELOS: 12 to 14 days    Today's Date: 07/28/2017 SLP Individual Time: 1030-1130 SLP Individual Time Calculation (min): 60 min   Problem List:  Patient Active Problem List   Diagnosis Date Noted  . Acute ischemic right MCA stroke ([place]) 07/26/2017  . Hyperlipidemia 07/23/2017  . Diastolic dysfunction   . Acute blood loss anemia   . Essential hypertension 07/22/2017  . Type 2 diabetes mellitus with renal manifestations ([place]) 07/22/2017  . Acute CVA (cerebrovascular accident) ([place]) 07/21/2017  . Chronic kidney disease, stage III (moderate) ([place]) 02/19/2013  . Atherosclerotic peripheral vascular disease ([place]) 02/19/2013  . Pure hypercholesterolemia 02/19/2013  . Diabetes mellitus, stable ([place]) 02/05/2013   Past Medical History:  Past Medical History:  Diagnosis Date  . Diabetes mellitus without complication ([place])   . Hyperlipidemia    Past Surgical History:  Past Surgical History:  Procedure Laterality Date  . FEMORAL-FEMORAL BYPASS GRAFT Bilateral 2007    Assessment / Plan / Recommendation Clinical Impression Eric Long a 77 y.o.malewith history of type 2 diabetes mellitus and hyperlipidemia who was admitted on 07/22/2017 with unsteady gait and left-sided weakness times 2 days.CT of headrevealed hypodensity parietal and temporal lobes consistent with acute to subacute infarct.CTA head neck showed 40-55% right ICA stenosis 80% left ICA stenosis severe stenosis origin left vertebral artery and moderate stenosis right vertebral artery. MRI of brain done showing large area of acute ischemia within the posterior right hemisphere involving right MCA and small portion right PCA question embolic petechial hemorrhage posterior right MCA distribution and old basal ganglia cerebellar lacunar  infarcts.2D echo done showing EF 60-65% no wall abnormalities and grade 1 diastolic dysfunction.EEG was normal study no epileptiform discharges noted. Dr. Leonie Man feltright MCA territory infarct due to intracranial and extracranial large vessel vessel therosclerosis--dual antiplatelet recommended and patient to participate in Stroke Afib trial.Carotid dopplers done reveling 40-50% R-ICA stenosis and Left 80-99% ICA stenosis.Encephalopathy has resolved and with recommendations to follow up with vascular after discharge. Patientwith resultant left visual field deficits,poor safety awareness,balance deficits and cognitive deficits.CIR recommended due to functional deficits with pt admited to CIR on 07/26/17.   Comprehensive cognitive linguistic evaluation completed on 07/28/17. Pt presents with high level cognitive deficits impacting complex problem solving, selective attention, left inattention and overall decreased awareness. Skilled ST required to address the above mentioned deficits, increase functional independence and reduce caregiver burden. Anticipate that pt will need intermittent 24 hour supervision at discharge. Don't anticipate that pt will need follow up ST services.    Skilled Therapeutic Interventions          Skilled treatment session focused on completion of cognitive linguistic evaluation, see above. SLP further facilitated session by providing Max A to Mod A cues to complete daily scheduling task and simple deductive reasoning task. Pt's visual deficits appeared to be most impactful on daily scheduling task. Despite heavy cues, pt was not able to understand higher level deductive reasoning questions. Pt's family present, results of evaluation, POC etc was discussed and all questions answered to their satisfaction.    SLP Assessment  Patient will need skilled [facility] Pathology Services during CIR admission    Recommendations  Patient destination: Home Follow up  Recommendations: None Equipment Recommended: None recommended by SLP    SLP Frequency 3 to 5 out of 7 days  SLP Duration  SLP Intensity  SLP Treatment/Interventions 12 to 14 days  Minumum of 1-2 x/day, 30 to 90 minutes  Cognitive remediation/compensation;Cueing hierarchy;Internal/external aids;Therapeutic Activities;Patient/family education;Functional tasks;Medication managment    Pain Pain Assessment Pain Score: 0-No pain  Prior Functioning Cognitive/Linguistic Baseline: Within functional limits Type of Home: House  Lives With: Spouse Available Help at Discharge: Family;Available 24 hours/day Education: Chief Financial Officer Vocation: Retired  Function:    Cognition Comprehension Comprehension assist level: Understands complex 90% of the time/cues 10% of the time  Expression   Expression assist level: Expresses basic needs/ideas: With no assist;Expresses complex 90% of the time/cues < 10% of the time  Technical sales engineer Social Interaction assist level: Interacts appropriately with others - No medications needed.  Problem Solving Problem solving assist level: Solves basic problems with no assist;Solves complex 90% of the time/cues < 10% of the time  Memory Memory assist level: More than reasonable amount of time   Short Term Goals: Week 1: SLP Short Term Goal 1 (Week 1): Pt will demonstrate selective attention in moderately distracting environment for ~ 45 minutes with supervision level cues.  SLP Short Term Goal 2 (Week 1): Pt will attend to left side of environment in moderately distracting task for ~ 40mnutes with Min A cues.  SLP Short Term Goal 3 (Week 1): Pt will solve complex problem solving tasks with Mod A cues.  SLP Short Term Goal 4 (Week 1): Pt will demonstrate anticipatory awareness by listing 3 activites that are safe and 3 activites that are not safe to participate in within home environment with Mod A cues.  SLP Short Term Goal 5 (Week 1): Pt willself-reflect on task  to gain awareness of deficits and rank task Min, Mod or Max difficult for him to complete with Min A cues for accuracy.   Refer to Care Plan for Long Term Goals  Recommendations for other services: None   Discharge Criteria: Patient will be discharged from SLP if patient refuses treatment 3 consecutive times without medical reason, if treatment goals not met, if there is a change in medical status, if patient makes no progress towards goals or if patient is discharged from hospital.  The above assessment, treatment plan, treatment alternatives and goals were discussed and mutually agreed upon: by patient and by family  Lauryl Seyer 07/28/2017, 1:13 PM

## 2017-07-29 ENCOUNTER — Inpatient Hospital Stay (HOSPITAL_COMMUNITY): Payer: Medicare HMO | Admitting: *Deleted

## 2017-07-29 ENCOUNTER — Inpatient Hospital Stay (HOSPITAL_COMMUNITY): Payer: Medicare HMO

## 2017-07-29 ENCOUNTER — Inpatient Hospital Stay (HOSPITAL_COMMUNITY): Payer: Medicare HMO | Admitting: Occupational Therapy

## 2017-07-29 ENCOUNTER — Inpatient Hospital Stay (HOSPITAL_COMMUNITY): Payer: Medicare HMO | Admitting: Physical Therapy

## 2017-07-29 LAB — GLUCOSE, CAPILLARY
GLUCOSE-CAPILLARY: 89 mg/dL (ref 65–99)
Glucose-Capillary: 81 mg/dL (ref 65–99)
Glucose-Capillary: 91 mg/dL (ref 65–99)
Glucose-Capillary: 107 mg/dL — ABNORMAL HIGH (ref 65–99)

## 2017-07-29 MED ORDER — BLOOD PRESSURE CONTROL BOOK
Freq: Once | Status: AC
  Administered 2017-07-29: 18:00:00
  Filled 2017-07-29: qty 1

## 2017-07-29 MED ORDER — LIVING WELL WITH DIABETES BOOK
Freq: Once | Status: AC
  Administered 2017-07-29: 18:00:00
  Filled 2017-07-29: qty 1

## 2017-07-29 NOTE — Plan of Care (Signed)
  Problem: RH BOWEL ELIMINATION Goal: RH STG MANAGE BOWEL WITH ASSISTANCE Description STG Manage Bowel with  Mod Assistance.  Outcome: Progressing   Problem: RH BLADDER ELIMINATION Goal: RH STG MANAGE BLADDER WITH ASSISTANCE Description STG Manage Bladder With  Mod Assistance  Outcome: Progressing   Problem: RH SAFETY Goal: RH STG ADHERE TO SAFETY PRECAUTIONS W/ASSISTANCE/DEVICE Description STG Adhere to Safety Precautions With Assistance/Device. Outcome: Progressing

## 2017-07-29 NOTE — Progress Notes (Signed)
Occupational Therapy Session Note  Patient Details  Name: Eric Long MRN: [id number] Date of Birth: [birth year]  Today's Date: 07/29/2017 OT Individual Time: 1000-1100 OT Individual Time Calculation (min): 60 min   Short Term Goals: Week 1:  OT Short Term Goal 1 (Week 1): Pt will stand at isnk for 2 grooming items to demo improved endurance OT Short Term Goal 2 (Week 1): Pt will complete toilet transfer wiht touching A OT Short Term Goal 3 (Week 1): Pt will thread BLE into pants wiht no more than 1 VC for orientation to demonstrate improved L attention/spatial awareness OT Short Term Goal 4 (Week 1): Pt will locate 4/5 objects in far L visual field with no more than min question cues  Skilled Therapeutic Interventions/Progress Updates:    OT treatment session focused on modified bathing/dressing, functional use of L UE, sit<>stand, and transfers. Pt greeted in wc and brought to wardrobe to pick out clothing for the day using B UEs. Stand-pivot into shower with min A 2/2 discoordination of LLE. Bathing completed with min cues to integrate L UE into bathing tasks. Pt was able to maintain grasp on wash cloth for 75% of the time, but did drop items on occasion. Standing balance while brushing teeth at the sink. Incorporated L fine motor control with opening containers. Dressing completed with set-up A and min A to pull up pants. Incorporated forced use of L UE. Pt able to don shoes today but needed assistance to fasten 2/2 L hand ataxia. Pt left seated in wc with safety belt on, chair alarm on, and family present.   Therapy Documentation Precautions:  Precautions Precautions: Fall, Other (comment) Precaution Comments: Left visual deficits Restrictions Weight Bearing Restrictions: No Pain: Pain Assessment Pain Scale: 0-10 Pain Score: 0-No pain  See Function Navigator for Current Functional Status.   Therapy/Group: Individual Therapy  Valma Cava 07/29/2017, 10:45 AM

## 2017-07-29 NOTE — IPOC Note (Signed)
Overall Plan of Care [facility]) Patient Details Name: AVEREY TROMPETER MRN: [id number] DOB: 09/10/1940  Admitting Diagnosis: <principal problem not specified>  Hospital Problems: Active Problems:   Acute ischemic right MCA stroke [facility])     Functional Problem List: Nursing    PT Balance, Behavior, Endurance, Motor, Pain, Safety, Perception, Sensory  OT Balance, Cognition, Endurance, Motor, Perception, Safety, Sensory, Vision  SLP Cognition, Perception  TR Endurance, Motor, Safety       Basic ADL's: OT Grooming, Dressing, Bathing, Toileting     Advanced  ADL's: OT       Transfers: PT Bed Mobility, Bed to Chair, Car, [employer], Futures trader, Metallurgist: PT Stairs, Emergency planning/management officer, Ambulation     Additional Impairments: OT Fuctional Use of Upper Extremity  SLP Social Cognition   Problem Solving, Memory, Attention, Awareness  TR      Anticipated Outcomes Item Anticipated Outcome  Self Feeding MOD I  Swallowing      Basic self-care  S  Toileting  S   Bathroom Transfers S  Bowel/Bladder     Transfers  supervision  Locomotion  supervision   Communication     Cognition  Supervision  Pain     Safety/Judgment      Therapy Plan: PT Intensity: Minimum of 1-2 x/day ,45 to 90 minutes PT Frequency: 5 out of 7 days PT Duration Estimated Length of Stay: 12-14 days OT Intensity: Minimum of 1-2 x/day, 45 to 90 minutes OT Frequency: 5 out of 7 days OT Duration/Estimated Length of Stay: 10-14 SLP Intensity: Minumum of 1-2 x/day, 30 to 90 minutes SLP Frequency: 3 to 5 out of 7 days SLP Duration/Estimated Length of Stay: 12 to 14 days    Team Interventions: Nursing Interventions    PT interventions Ambulation/gait training, Discharge planning, Functional mobility training, Therapeutic Activities, Balance/vestibular training, Neuromuscular re-education, Therapeutic Exercise, Wheelchair propulsion/positioning, Cognitive remediation/compensation,  DME/adaptive equipment instruction, Pain management, Splinting/orthotics, UE/LE Strength taining/ROM, Community reintegration, Technical sales engineer stimulation, Patient/family education, IT trainer, UE/LE Coordination activities  OT Interventions Training and development officer, Discharge planning, Pain management, Self Care/advanced ADL retraining, Therapeutic Activities, UE/LE Coordination activities, Cognitive remediation/compensation, Disease mangement/prevention, Functional mobility training, Patient/family education, Skin care/wound managment, Therapeutic Exercise, Visual/perceptual remediation/compensation, Academic librarian, Engineer, drilling, Neuromuscular re-education, Psychosocial support, UE/LE Strength taining/ROM, Wheelchair propulsion/positioning  SLP Interventions Cognitive remediation/compensation, English as a second language teacher, Internal/external aids, Therapeutic Activities, Patient/family education, Functional tasks, Medication managment  TR Interventions Adaptive equipment instruction, Cognitive remediation/compensation, Group participation (Comment), Therapeutic exercise, 1:1 session, Academic librarian, Recreation/leisure participation, UE/LE Coordination activities, Training and development officer, Functional mobility training, Patient/family education, Therapeutic activities, Visual/perceptual remediation/compensation  SW/CM Interventions Discharge Planning, Psychosocial Support, Patient/Family Education   Barriers to Discharge MD  Medical stability  Nursing      PT      OT      SLP      SW       Team Discharge Planning: Destination: PT-Home ,OT- Home , SLP-Home Projected Follow-up: PT-Outpatient PT, OT-  Home health OT, SLP-None Projected Equipment Needs: PT-To be determined, OT- 3 in 1 bedside comode, To be determined, Tub/shower bench, SLP-None recommended by SLP Equipment Details: PT- , OT-  Patient/family involved in discharge planning: PT- Patient,  Family member/caregiver,  OT-Patient, SLP-Patient, Family member/caregiver  MD ELOS: 10-14d Medical Rehab Prognosis:  Excellent Assessment:  77 y.o.malewith history of type 2 diabetes mellitus and hyperlipidemia who was admitted on 07/22/2017 with unsteady gait and left-sided weakness times 2 days.CT of headrevealed hypodensity parietal and  temporal lobes consistent with acute to subacute infarct.CTA head neck showed 40-55% right ICA stenosis 80% left ICA stenosis severe stenosis origin left vertebral artery and moderate stenosis right vertebral artery. MRI of brain done showing large area of acute ischemia within the posterior right hemisphere involving right MCA and small portion right PCA question embolic petechial hemorrhage posterior right MCA distribution and old basal ganglia cerebellar lacunar infarcts.2D echo done showing EF 60-65% no wall abnormalities and grade 1 diastolic dysfunction.EEG was normal study no epileptiform discharges noted    Now requiring 24/7 Rehab RN,MD, as well as CIR level PT, OT and SLP.  Treatment team will focus on ADLs and mobility with goals set at Supervision  See Team Conference Notes for weekly updates to the plan of care

## 2017-07-29 NOTE — Progress Notes (Signed)
Physical Therapy Session Note  Patient Details  Name: Eric Long MRN: [id number] Date of Birth: 09/26/1940  Today's Date: 07/29/2017 PT Individual Time: 0762-2633 PT Individual Time Calculation (min): 70 min   Short Term Goals: Week 1:  PT Short Term Goal 1 (Week 1): pt will ambulate with LRAD and min assist 150 ft PT Short Term Goal 2 (Week 1): Pt will ascend/descend 4 steps with min assist PT Short Term Goal 3 (Week 1): Pt will perform bed<>chair transfer with min assist  Skilled Therapeutic Interventions/Progress Updates:   Pt in w/c and agreeable to therapy, denies pain at rest. Pt self-propelled w/c to therapy gym w/ BLEs to work on LE reciprocal movement pattern. Worked on gait training first half of session including ambulating at rail in hallway and w/ RW. Pt w/ significant wide stepping, excessive L external rotation, and mild ataxic gait w/ both devices. He required many verbal, manual, and tactile cues to attend to LLE during gait, causing decreased safety and increased risk of falling. Practiced gait w/ patient holding onto therapist's elbows while therapist was walking backwards in front of patient providing upright assist at gait belt and visual/verbal cues for reciprocal foot placement, step length, and neutral foot alignment. Pt w/ decreased BOS, decreased step length, and decreased LLE ER when performing this way. He also was able to mimic more reciprocal and fluid gait pattern when following therapist's feet. Mod assist overall for all gait 2/2 cues required for safety and gait pattern. Seated rests in between gait bouts and worked on performing sit<>stands w/ equal LE weight distribution using mirror feedback and targets on ground for foot placement. Min guard for sit<>stands to RW. Performed NuStep 5 min @ L1 for reciprocal movement pattern and limb dissociation. Manual and tactile cues for technique throughout the 5 minutes. Returned to room via w/c and ended session in w/c,  in care of wife/son and all needs met. Educated on spending time w/ both LEs planted on ground in sitting when a caregiver is present for safety. Discussed benefits of increased sensory input to bottom of feet for neuro re-ed.   Therapy Documentation Precautions:  Precautions Precautions: Fall, Other (comment) Precaution Comments: Left visual deficits Restrictions Weight Bearing Restrictions: No  See Function Navigator for Current Functional Status.   Therapy/Group: Individual Therapy  Asyia Hornung K Arnette 07/29/2017, 5:09 PM

## 2017-07-29 NOTE — Evaluation (Signed)
Recreational Therapy Assessment and Plan  Patient Details  Name: Eric Long MRN: [id number] Date of Birth: 1941-03-02 Today's Date: 07/29/2017  Rehab Potential: Poor ELOS: 2 weeks   Assessment  Problem List:      Patient Active Problem List   Diagnosis Date Noted  . Acute ischemic right MCA stroke ([place]) 07/26/2017  . Hyperlipidemia 07/23/2017  . Diastolic dysfunction   . Acute blood loss anemia   . Essential hypertension 07/22/2017  . Type 2 diabetes mellitus with renal manifestations ([place]) 07/22/2017  . Acute CVA (cerebrovascular accident) ([place]) 07/21/2017  . Chronic kidney disease, stage III (moderate) ([place]) 02/19/2013  . Atherosclerotic peripheral vascular disease ([place]) 02/19/2013  . Pure hypercholesterolemia 02/19/2013  . Diabetes mellitus, stable ([place]) 02/05/2013    Past Medical History:      Past Medical History:  Diagnosis Date  . Diabetes mellitus without complication ([place])   . Hyperlipidemia    Past Surgical History:       Past Surgical History:  Procedure Laterality Date  . FEMORAL-FEMORAL BYPASS GRAFT Bilateral 2007    Assessment & Plan Clinical Impression: 77 y.o. male with a history of diabetes, hyperlipidemia who presents with unsteady gait. MRI: Large area of acute ischemia within the posterior right hemisphere, involving the posterior right MCA territory and a small portion of the right PCA territory.    Pt presents with decreased activity tolerance, decreased functional mobility, decreased balance, decreased coordination, decreased vision, left inattention, decreased awareness, decreased problem solving and decreased safety Limiting pt's independence with leisure/community pursuits.   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) Other Leisure Interests:  Teaching laboratory technician Leisure Participation Style: Alone;With Family/Friends Awareness of Community Resources: Excellent Psychosocial / Spiritual Spiritual Interests: Church Does patient have pets?: No Social interaction - Mood/Behavior: Cooperative Engineer, drilling for Education?: Yes Patient Agreeable to Outing?: Yes Recreational Therapy Orientation Orientation -Reviewed with patient: Available activity resources Strengths/Weaknesses Patient Strengths/Abilities: Willingness to participate;Active premorbidly Patient weaknesses: Physical limitations TR Patient demonstrates impairments in the following area(s): Endurance;Motor;Safety  Plan Rec Therapy Plan Is patient appropriate for Therapeutic Recreation?: Yes Rehab Potential: Poor Treatment times per week: MIn 1 TR session/group >20 minutes during LOS Estimated Length of Stay: 2 weeks TR Treatment/Interventions: Adaptive equipment instruction;Cognitive remediation/compensation;Group participation (Comment);Therapeutic exercise;1:1 session;Community reintegration;Recreation/leisure participation;UE/LE Coordination activities;Balance/vestibular training;Functional mobility training;Patient/family education;Therapeutic activities;Visual/perceptual remediation/compensation Recommendations for other services: Neuropsych  Recommendations for other services: Neuropsych  Discharge Criteria: Patient will be discharged from TR if patient refuses treatment 3 consecutive times without medical reason.  If treatment goals not met, if there is a change in medical status, if patient makes no progress towards goals or if patient is discharged from hospital.  The above assessment, treatment plan, treatment alternatives and goals were discussed and mutually agreed upon: by patient  [name] 07/29/2017, 3:31 PM

## 2017-07-29 NOTE — Progress Notes (Addendum)
Subjective/Complaints:  No issues overnite No issues reported by RN  ROS- neg CP, SOB, N/V/D  Objective: Vital Signs: Blood pressure 140/74, pulse 74, temperature 98.9 F (37.2 C), temperature source Oral, resp. rate 16, height 5' 10.5" (1.791 m), weight 102.6 kg (226 lb 4.8 oz), SpO2 100 %. No results found. Results for orders placed or performed during the hospital encounter of 07/26/17 (from the past 72 hour(s))  Glucose, capillary     Status: Abnormal   Collection Time: 07/26/17  5:44 PM  Result Value Ref Range   Glucose-Capillary 100 (H) 65 - 99 mg/dL  Glucose, capillary     Status: Abnormal   Collection Time: 07/26/17  9:16 PM  Result Value Ref Range   Glucose-Capillary 126 (H) 65 - 99 mg/dL  CBC WITH DIFFERENTIAL     Status: Abnormal   Collection Time: 07/26/17 11:57 PM  Result Value Ref Range   WBC 8.7 4.0 - 10.5 K/uL   RBC 3.97 (L) 4.22 - 5.81 MIL/uL   Hemoglobin 11.8 (L) 13.0 - 17.0 g/dL   HCT 37.0 (L) 39.0 - 52.0 %   MCV 93.2 78.0 - 100.0 fL   MCH 29.7 26.0 - 34.0 pg   MCHC 31.9 30.0 - 36.0 g/dL   RDW 14.0 11.5 - 15.5 %   Platelets 276 150 - 400 K/uL   Neutrophils Relative % 55 %   Neutro Abs 4.7 1.7 - 7.7 K/uL   Lymphocytes Relative 32 %   Lymphs Abs 2.8 0.7 - 4.0 K/uL   Monocytes Relative 10 %   Monocytes Absolute 0.9 0.1 - 1.0 K/uL   Eosinophils Relative 3 %   Eosinophils Absolute 0.3 0.0 - 0.7 K/uL   Basophils Relative 0 %   Basophils Absolute 0.0 0.0 - 0.1 K/uL    Comment: Performed at [facility], [address], [place], [place] [address]  Comprehensive metabolic panel     Status: Abnormal   Collection Time: 07/26/17 11:57 PM  Result Value Ref Range   Sodium 137 135 - 145 mmol/L   Potassium 3.8 3.5 - 5.1 mmol/L   Chloride 102 101 - 111 mmol/L   CO2 26 22 - 32 mmol/L   Glucose, Bld 113 (H) 65 - 99 mg/dL   BUN 15 6 - 20 mg/dL   Creatinine, Ser 1.51 (H) 0.61 - 1.24 mg/dL   Calcium 9.1 8.9 - 10.3 mg/dL   Total Protein 7.4 6.5 - 8.1 g/dL   Albumin 3.3 (L) 3.5 - 5.0 g/dL   AST 24 15 - 41 U/L   ALT 22 17 - 63 U/L   Alkaline Phosphatase 105 38 - 126 U/L   Total Bilirubin 0.5 0.3 - 1.2 mg/dL   GFR calc non Af Amer 42 (L) >60 mL/min   GFR calc Af Amer 49 (L) >60 mL/min    Comment: (NOTE) The eGFR has been calculated using the CKD EPI equation. This calculation has not been validated in all clinical situations. eGFR's persistently <60 mL/min signify possible Chronic Kidney Disease.    Anion gap 9 5 - 15    Comment: Performed at [facility] [address], [place], [place] [address]  Glucose, capillary     Status: None   Collection Time: 07/27/17  6:29 AM  Result Value Ref Range   Glucose-Capillary 97 65 - 99 mg/dL  Glucose, capillary     Status: Abnormal   Collection Time: 07/27/17 12:03 PM  Result Value Ref Range   Glucose-Capillary  108 (H) 65 - 99 mg/dL  Glucose, capillary     Status: Abnormal   Collection Time: 07/27/17  4:41 PM  Result Value Ref Range   Glucose-Capillary 107 (H) 65 - 99 mg/dL  Glucose, capillary     Status: None   Collection Time: 07/27/17 10:40 PM  Result Value Ref Range   Glucose-Capillary 73 65 - 99 mg/dL  Glucose, capillary     Status: None   Collection Time: 07/28/17  6:19 AM  Result Value Ref Range   Glucose-Capillary 78 65 - 99 mg/dL  Glucose, capillary     Status: None   Collection Time: 07/28/17 12:06 PM  Result Value Ref Range   Glucose-Capillary 89 65 - 99 mg/dL  Glucose, capillary     Status: Abnormal   Collection Time: 07/28/17  4:54 PM  Result Value Ref Range   Glucose-Capillary 103 (H) 65 - 99 mg/dL  Glucose, capillary     Status: None   Collection Time: 07/28/17  9:20 PM  Result Value Ref Range   Glucose-Capillary 78 65 - 99 mg/dL  Glucose, capillary     Status: None   Collection Time: 07/29/17  6:27 AM  Result Value Ref Range   Glucose-Capillary 81 65 - 99 mg/dL     HEENT: normal Cardio: RRR and no murmur Resp: CTA B/L and unlabored GI: BS positive and NT,  ND Extremity:  No Edema Skin:   Intact Neuro: Alert/Oriented, Abnormal Sensory Reduced sensation LT, Left foot, Abnormal Motor 4/5 Left delt, Bi, Tri, grip, HF, KE, ADF, Abnormal FMC Ataxic/ dec FMC and Inattention Musc/Skel:  Other no pain with UE or LE ROM Gen NAD Left homonymous hemianopsia  Assessment/Plan: 1. Functional deficits secondary to Left hemiparesis and Left homonymous hemianopsia due to R MCA infarct which require 3+ hours per day of interdisciplinary therapy in a comprehensive inpatient rehab setting. Physiatrist is providing close team supervision and 24 hour management of active medical problems listed below. Physiatrist and rehab team continue to assess barriers to discharge/monitor patient progress toward functional and medical goals. FIM: Function - Bathing Position: Shower Body parts bathed by patient: Right arm, Left arm, Chest, Abdomen, Front perineal area, Right upper leg, Left upper leg, Right lower leg, Left lower leg, Buttocks Body parts bathed by helper: Back Assist Level: Touching or steadying assistance(Pt > 75%)  Function- Upper Body Dressing/Undressing What is the patient wearing?: Pull over shirt/dress Pull over shirt/dress - Perfomed by patient: Thread/unthread right sleeve, Thread/unthread left sleeve, Put head through opening, Pull shirt over trunk Function - Lower Body Dressing/Undressing What is the patient wearing?: Pants, Socks, Shoes Position: Wheelchair/chair at sink Pants- Performed by patient: Thread/unthread right pants leg, Thread/unthread left pants leg Pants- Performed by helper: Pull pants up/down Non-skid slipper socks- Performed by patient: Don/doff right sock, Don/doff left sock Socks - Performed by helper: Don/doff right sock, Don/doff left sock Shoes - Performed by helper: Don/doff right shoe, Don/doff left shoe, Fasten right, Fasten left Assist for footwear: Supervision/touching assist Assist for lower body dressing: Touching or  steadying assistance (Pt > 75%)  Function - Toileting Toileting activity did not occur: No continent bowel/bladder event  Function - Air cabin crew transfer assistive device: Grab bar Assist level to toilet: Moderate assist (Pt 50 - 74%/lift or lower) Assist level from toilet: Moderate assist (Pt 50 - 74%/lift or lower)  Function - Chair/bed transfer Chair/bed transfer method: Squat pivot Chair/bed transfer assist level: Touching or steadying assistance (Pt > 75%) Chair/bed transfer assistive  device: Armrests Chair/bed transfer details: Verbal cues for precautions/safety, Verbal cues for technique, Verbal cues for sequencing, Tactile cues for posture  Function - Locomotion: Wheelchair Will patient use wheelchair at discharge?: Yes Type: Manual Max wheelchair distance: 150 ft Assist Level: Supervision or verbal cues(B LEs) Assist Level: Supervision or verbal cues Assist Level: Supervision or verbal cues Turns around,maneuvers to table,bed, and toilet,negotiates 3% grade,maneuvers on rugs and over doorsills: No Function - Locomotion: Ambulation Assistive device: Rail in hallway Max distance: 30 ft Assist level: Moderate assist (Pt 50 - 74%) Assist level: Moderate assist (Pt 50 - 74%) Assist level: Moderate assist (Pt 50 - 74%) Walk 150 feet activity did not occur: Safety/medical concerns Walk 10 feet on uneven surfaces activity did not occur: Safety/medical concerns  Function - Comprehension Comprehension: Auditory Comprehension assist level: Understands complex 90% of the time/cues 10% of the time  Function - Expression Expression: Verbal Expression assist level: Expresses basic needs/ideas: With no assist, Expresses complex 90% of the time/cues < 10% of the time  Function - Social Interaction Social Interaction assist level: Interacts appropriately with others - No medications needed.  Function - Problem Solving Problem solving assist level: Solves basic problems  with no assist, Solves complex 90% of the time/cues < 10% of the time  Function - Memory Memory assist level: More than reasonable amount of time Patient normally able to recall (first 3 days only): Current season, Staff names and faces, That he or she is in a hospital, Location of own room  Medical Problem List and Plan: 1.   Left hemiparesis, Left homonymous hemianopsia secondary to right posterior MCA infarct.CIR CIR PT, OT,SLP, team conf in am 2. DVT Prophylaxis/Anticoagulation: Pharmaceutical:Lovenox 3. Pain Management:N/A 4. Mood:LCSW to follow for evaluation and support. 5. Neuropsych: This patientiscapable of making decisions on hisown behalf. 6. Skin/Wound Care:routine pressure relief measures. 7. Fluids/Electrolytes/Nutrition:Monitor I/O. Offer supplements prn if intake poor. 8. HTN: Monitor BP bid. Continuetoprol XL Vitals:   07/28/17 1444 07/29/17 0552  BP: (!) 119/99 140/74  Pulse: 84 74  Resp: 18 16  Temp: 97.7 F (36.5 C) 98.9 F (37.2 C)  SpO2: 100% 100%  controlled 3/26 9.T2DM: Hgb A1C-7.6. Used Lantus with breakfast,[place]ith lunchand [place] with supper. Wife to bring in meds..Monitor BS ac/hs and continue Lantus with SSI. CBG (last 3)  Recent Labs    07/28/17 1654 07/28/17 2120 07/29/17 0627  GLUCAP 103* 78 81  Controlled 3/26 10. CKDstage III:GFR stable  11. Normocytic anemia:Monitor --will add iron supplement. 12. Hyperlipidemia: On Crestor and zetia.    LOS (Days) 3 A FACE TO FACE EVALUATION WAS PERFORMED  Charlett Blake 07/29/2017, 8:00 AM

## 2017-07-29 NOTE — Progress Notes (Signed)
Physical Therapy Session Note  Patient Details  Name: Eric Long MRN: [id number] Date of Birth: [birth year]  Today's Date: 07/29/2017 PT Individual Time: 0801-0900 PT Individual Time Calculation (min): 59 min   Short Term Goals: Week 1:  PT Short Term Goal 1 (Week 1): pt will ambulate with LRAD and min assist 150 ft PT Short Term Goal 2 (Week 1): Pt will ascend/descend 4 steps with min assist PT Short Term Goal 3 (Week 1): Pt will perform bed<>chair transfer with min assist  Skilled Therapeutic Interventions/Progress Updates:    Pt supine in bed upon PT arrival, agreeable to therapy tx and denies pain. Pt transferred from supine>sitting EOB with min assist and verbal cues for body awareness. Pt seated EOB performed upper and lower body dressing, pt requiring assist for coordination to find L sleeve with L UE. Pt performed sit<>stand with min assist and able to maintain standing balance min assist without UE support in order to pull shorts over hips. Pt transferred from bed>w/c with min assist, squat pivot transfer, verbal cues for set up, techniques and awareness. Pt transported from room>gym. Pt ambulated x 65 ft with RW and min assist when walking in a straight path, verbal cues for body awareness and foot placement, mod assist when turning to sit on the mat with max verbal cues for foot placement and RW management with turn. Pt worked on stepping to/back from target on the floor working on [employer], Geologist, engineering for [employer], no AD, mod assist. Pt ambulated x 40 ft without AD, mod assist, verbal cues for foot placement, pt continues with wide BOS and ataxic gait, improved from previous session. Pt transported back to room and left seated in w/c with needs in reach, son present, QRB in place and chair alarm set.   Therapy Documentation Precautions:  Precautions Precautions: Fall, Other (comment) Precaution Comments: Left visual deficits Restrictions Weight Bearing Restrictions:  No   See Function Navigator for Current Functional Status.   Therapy/Group: Individual Therapy  Netta Corrigan, PT, DPT 07/29/2017, 7:45 AM

## 2017-07-30 ENCOUNTER — Inpatient Hospital Stay (HOSPITAL_COMMUNITY): Payer: Medicare HMO | Admitting: Occupational Therapy

## 2017-07-30 ENCOUNTER — Inpatient Hospital Stay (HOSPITAL_COMMUNITY): Payer: Medicare HMO

## 2017-07-30 LAB — GLUCOSE, CAPILLARY
Glucose-Capillary: 77 mg/dL (ref 65–99)
Glucose-Capillary: 81 mg/dL (ref 65–99)
Glucose-Capillary: 79 mg/dL (ref 65–99)
Glucose-Capillary: 100 mg/dL — ABNORMAL HIGH (ref 65–99)

## 2017-07-30 LAB — BASIC METABOLIC PANEL
Anion gap: 11 (ref 5–15)
BUN: 13 mg/dL (ref 6–20)
CHLORIDE: 99 mmol/L — AB (ref 101–111)
CO2: 27 mmol/L (ref 22–32)
Calcium: 9.1 mg/dL (ref 8.9–10.3)
Creatinine, Ser: 1.2 mg/dL (ref 0.61–1.24)
GFR calc Af Amer: >60 mL/min (ref >60)
GFR calc non Af Amer: 56 mL/min — ABNORMAL LOW (ref >60)
GLUCOSE: 83 mg/dL (ref 65–99)
POTASSIUM: 3.4 mmol/L — AB (ref 3.5–5.1)
Sodium: 137 mmol/L (ref 135–145)

## 2017-07-30 LAB — CBC
HEMATOCRIT: 37.5 % — AB (ref 39.0–52.0)
Hemoglobin: 12 g/dL — ABNORMAL LOW (ref 13.0–17.0)
MCH: 29.8 pg (ref 26.0–34.0)
MCHC: 32 g/dL (ref 30.0–36.0)
MCV: 93.1 fL (ref 78.0–100.0)
Platelets: 266 10*3/uL (ref 150–400)
RBC: 4.03 MIL/uL — ABNORMAL LOW (ref 4.22–5.81)
RDW: 13.8 % (ref 11.5–15.5)
WBC: 8.7 10*3/uL (ref 4.0–10.5)

## 2017-07-30 NOTE — Plan of Care (Signed)
  Problem: RH BOWEL ELIMINATION Goal: RH STG MANAGE BOWEL WITH ASSISTANCE Description STG Manage Bowel with  Mod Assistance.  Outcome: Progressing   Problem: RH BLADDER ELIMINATION Goal: RH STG MANAGE BLADDER WITH ASSISTANCE Description STG Manage Bladder With  Mod Assistance  Outcome: Progressing

## 2017-07-30 NOTE — Progress Notes (Signed)
Pt resting in bed with 2 SR up. Denies any pain at present. Call light in reach. Pt educated to fall precautions , calling for assistance, what to expect on the first day of therapy. Pt's lungs clear BS positive. Pt's questions answered.

## 2017-07-30 NOTE — Progress Notes (Signed)
Occupational Therapy Session Note  Patient Details  Name: Eric Long MRN: [id number] Date of Birth: November 17, 1940  Today's Date: 07/30/2017  Session 1 OT Individual Time: 0900-1000 OT Individual Time Calculation (min): 60 min  Session 2 OT Individual Time: 1500-1540 OT Individual Time Calculation (min): 40 min    Short Term Goals: Week 1:  OT Short Term Goal 1 (Week 1): Pt will stand at isnk for 2 grooming items to demo improved endurance OT Short Term Goal 2 (Week 1): Pt will complete toilet transfer wiht touching A OT Short Term Goal 3 (Week 1): Pt will thread BLE into pants wiht no more than 1 VC for orientation to demonstrate improved L attention/spatial awareness OT Short Term Goal 4 (Week 1): Pt will locate 4/5 objects in far L visual field with no more than min question cues  Skilled Therapeutic Interventions/Progress Updates:  Session 1   Pt greeted semi-reclined in bed and agreeable to OT. Stand-pivot transfer bed>wc with min/mod A and verbal cues for foot placement. Stand-pivot into shower with multimodal cues for head-hips relationship and LLE placement. Bathing completed using BUE's and min cues for use of L UE. Worked on fine motor coordination with opening and closing containers. Pt with sensation and proprioception deficits and worked on pressure judegement within BADLs. Dynamic balance with standing toothbrushing task. Pt needed assistance to orient clothing today, then was able to thread extremities. Forced use of UE throughout ADLs. Pt left seated in wc at end of session with needs met and call bell in reach.   Session 2 Pt donned shoes seated in recliner and worked on fine motor coordination to tie shoes. Stand-pivot to wc with min/mod A. Pt transferred in similar fashion to therapy mat. Placed 1lb weight on L wrist to increase proprioceptive input while completing fine motor task of beading. Utilized different size and shaped beads to find just right challenge. Isolated  shoulder ER and L attention with placing beads into bin placed behind pt on L side. Pt provided with foam cubes to continue practiced L fine motor control. Pt returned to room and left seated in wc with safety belt on, needs met, and family present.  Therapy Documentation Precautions:  Precautions Precautions: Fall, Other (comment) Precaution Comments: Left visual deficits Restrictions Weight Bearing Restrictions: No Pain: None/denies pain See Function Navigator for Current Functional Status.  Therapy/Group: Individual Therapy  Valma Cava 07/30/2017, 3:52 PM

## 2017-07-30 NOTE — Patient Care Conference (Signed)
Inpatient RehabilitationTeam Conference and Plan of Care Update Date: 07/30/2017   Time: 11:00 AM    Patient Name: Eric Long      Medical Record Number: [id number]  Date of Birth: 30-Jun-1940 Sex: Male         Room/Bed: 4W21C/4W21C-01 Payor Info: Payor: [employer] MEDICARE / Plan: [employer] MEDICARE HMO/PPO / Product Type: *No Product type* /    Admitting Diagnosis: r CVA  Admit Date/Time:  07/26/2017 12:32 PM Admission Comments: No comment available   Primary Diagnosis:  <principal problem not specified> Principal Problem: <principal problem not specified>  Patient Active Problem List   Diagnosis Date Noted  . Acute ischemic right MCA stroke ([place]) 07/26/2017  . Hyperlipidemia 07/23/2017  . Diastolic dysfunction   . Acute blood loss anemia   . Essential hypertension 07/22/2017  . Type 2 diabetes mellitus with renal manifestations ([place]) 07/22/2017  . Acute CVA (cerebrovascular accident) ([place]) 07/21/2017  . Chronic kidney disease, stage III (moderate) ([place]) 02/19/2013  . Atherosclerotic peripheral vascular disease ([place]) 02/19/2013  . Pure hypercholesterolemia 02/19/2013  . Diabetes mellitus, stable ([place]) 02/05/2013    Expected Discharge Date: Expected Discharge Date: 08/09/17  Team Members Present: Physician leading conference: Dr. Alysia Penna Nurse Present: Dorthula Nettles, RN PT Present: Michaelene Song, PT OT Present: Cherylynn Ridges, OT SLP Present: Weston Anna, SLP PPS Coordinator present : Daiva Nakayama, RN, CRRN     Current Status/Progress Goal Weekly Team Focus  Medical   CBG control, pt with neglect, using LUE , has ataxia LLE and reduced sensation  maintain diabetic control  Initiate rehab program, increase attention to left    Bowel/Bladder   Continent of B/B  Maintain continence  Timed toileting   Swallow/Nutrition/ Hydration             ADL's   Min/mod A overall for BADL tasks  Supervision  L fine motor control, L attention, L field cut, pt/family  education, modified bathing/dressing   Mobility   min-mod overall for all mobility, gait >50' w/ RW, rail, or HHA; requires max cues for LLE awareness, attending to L side of body, and sequencing of all functional tasks  supervision overall  L NMR, L attention and body awareness, transfers and gait, family education on impairments   Communication             Safety/Cognition/ Behavioral Observations  Mod A  Supervision  complex problem solving, attention and awareness    Pain   Denies Pain  Pain < 3  Assess Qshift and PRN   Skin   Skin Intact  Prevent breakd/infection  Assess Qshift and PRN      *See Care Plan and progress notes for long and short-term goals.     Barriers to Discharge  Current Status/Progress Possible Resolutions Date Resolved   Physician    Medical stability;Weight;Home environment access/layout  steps at home without rail  progressing well  Family adding rails      Nursing                  PT  Home environment access/layout  2 steps to enter home w/o rails  Family planning to install rails for stairs, working on home measurements           OT                  SLP                SW  Discharge Planning/Teaching Needs:  Wife is here to observe in therapies when able trying to work now and will try to take off when he comes home      Team Discussion:  Goals of supervision level-needs to work on where his body is in space and left side awareness and neglect. Needs cues to attend to left side of his body. Diabetes controlled according to MD. Family members here daily to attend therapies with pt. Putting up rails at home.  Revisions to Treatment Plan:  DC 4/6    Continued Need for Acute Rehabilitation Level of Care: The patient requires daily medical management by a physician with specialized training in physical medicine and rehabilitation for the following conditions: Daily direction of a multidisciplinary physical rehabilitation program to  ensure safe treatment while eliciting the highest outcome that is of practical value to the patient.: Yes Daily medical management of patient stability for increased activity during participation in an intensive rehabilitation regime.: Yes Daily analysis of laboratory values and/or radiology reports with any subsequent need for medication adjustment of medical intervention for : Neurological problems;Mood/behavior problems  Domenik Trice, Gardiner Rhyme 07/30/2017, 1:05 PM

## 2017-07-30 NOTE — Progress Notes (Signed)
Physical Therapy Session Note  Patient Details  Name: Eric Long MRN: [id number] Date of Birth: June 26, 1940  Today's Date: 07/30/2017 PT Individual Time: 1001-1100 and 1301-1400 PT Individual Time Calculation (min): 59 min and 59 min  Short Term Goals: Week 1:  PT Short Term Goal 1 (Week 1): pt will ambulate with LRAD and min assist 150 ft PT Short Term Goal 2 (Week 1): Pt will ascend/descend 4 steps with min assist PT Short Term Goal 3 (Week 1): Pt will perform bed<>chair transfer with min assist  Skilled Therapeutic Interventions/Progress Updates:    Session 1: Pt seated in w/c upon PT arrival, agreeable to therapy tx and denies pain. Pt transported from room>gym. Attempted to perform gait with pt holding on to therapists elbows, pt unsteady and taking wide/uncoordinated steps even with verbal cues. Pt ambulated x 65 ft with RW and min assist, max verbal cues for foot placement, visual cues using a target to aim for when stepping with L LE. Pt ambulated x 65 with RW and ankle weights 3# for increased sensory input, max verbal cues for foot placement and RW management, more cues needed with turns. Pt ascended/descended 4 steps x 2 with B handrails, max verbal cues for foot placement and step to techniques, min to mod assist. Pt ambulated x 30 ft in the hall with RW and min assist, mirror for visual feedback and using green line to target each step on each side. Pt transported back to room, transferred to recliner with RW and min assist. Pt left seated with needs in reach and family present.   Session 2: Pt seated in recliner upon PT arrival, agreeable to therapy tx and denies pain. Pt transferred to w/c stand pivot using RW and min assist, verbal and visual cues for foot placement and RW management. Pt ambulated x 100 ft this session with RW and min-mod assist, ankle weights 3# for increased sensory input, max verbal cues for foot placement and RW management, using therapist's foot as a target  for pt to step to with L LE. Pt worked on coordination with LEs performing toe taps to colored dots on the floor, no AD, mod assist for balance and use of mirror for visual feedback. Pt performed toe taps to target (numbers) on 4 inch step with RW for UE support x 2 trials, min assist. Pt transported back to room and performed stand pivot transfer to recliner with min assist and RW. Left seated in recliner with needs in reach and family present.   Therapy Documentation Precautions:  Precautions Precautions: Fall, Other (comment) Precaution Comments: Left visual deficits Restrictions Weight Bearing Restrictions: No   See Function Navigator for Current Functional Status.   Therapy/Group: Individual Therapy  Netta Corrigan, PT, DPT 07/30/2017, 7:57 AM

## 2017-07-30 NOTE — Progress Notes (Signed)
Subjective/Complaints:  No issues overnite , son is visiting  ROS- neg CP, SOB, N/V/D  Objective: Vital Signs: Blood pressure 140/74, pulse 74, temperature 98.9 F (37.2 C), temperature source Oral, resp. rate 16, height 5' 10.5" (1.791 m), weight 103 kg (227 lb 1.2 oz), SpO2 100 %. No results found. Results for orders placed or performed during the hospital encounter of 07/26/17 (from the past 72 hour(s))  Glucose, capillary     Status: Abnormal   Collection Time: 07/27/17 12:03 PM  Result Value Ref Range   Glucose-Capillary 108 (H) 65 - 99 mg/dL  Glucose, capillary     Status: Abnormal   Collection Time: 07/27/17  4:41 PM  Result Value Ref Range   Glucose-Capillary 107 (H) 65 - 99 mg/dL  Glucose, capillary     Status: None   Collection Time: 07/27/17 10:40 PM  Result Value Ref Range   Glucose-Capillary 73 65 - 99 mg/dL  Glucose, capillary     Status: None   Collection Time: 07/28/17  6:19 AM  Result Value Ref Range   Glucose-Capillary 78 65 - 99 mg/dL  Glucose, capillary     Status: None   Collection Time: 07/28/17 12:06 PM  Result Value Ref Range   Glucose-Capillary 89 65 - 99 mg/dL  Glucose, capillary     Status: Abnormal   Collection Time: 07/28/17  4:54 PM  Result Value Ref Range   Glucose-Capillary 103 (H) 65 - 99 mg/dL  Glucose, capillary     Status: None   Collection Time: 07/28/17  9:20 PM  Result Value Ref Range   Glucose-Capillary 78 65 - 99 mg/dL  Glucose, capillary     Status: None   Collection Time: 07/29/17  6:27 AM  Result Value Ref Range   Glucose-Capillary 81 65 - 99 mg/dL  Glucose, capillary     Status: None   Collection Time: 07/29/17 11:43 AM  Result Value Ref Range   Glucose-Capillary 89 65 - 99 mg/dL  Glucose, capillary     Status: None   Collection Time: 07/29/17  4:43 PM  Result Value Ref Range   Glucose-Capillary 91 65 - 99 mg/dL  Glucose, capillary     Status: Abnormal   Collection Time: 07/29/17  8:42 PM  Result Value Ref Range    Glucose-Capillary 107 (H) 65 - 99 mg/dL  Glucose, capillary     Status: None   Collection Time: 07/30/17  6:29 AM  Result Value Ref Range   Glucose-Capillary 77 65 - 99 mg/dL  Basic metabolic panel     Status: Abnormal   Collection Time: 07/30/17  7:05 AM  Result Value Ref Range   Sodium 137 135 - 145 mmol/L   Potassium 3.4 (L) 3.5 - 5.1 mmol/L   Chloride 99 (L) 101 - 111 mmol/L   CO2 27 22 - 32 mmol/L   Glucose, Bld 83 65 - 99 mg/dL   BUN 13 6 - 20 mg/dL   Creatinine, Ser 1.20 0.61 - 1.24 mg/dL   Calcium 9.1 8.9 - 10.3 mg/dL   GFR calc non Af Amer 56 (L) >60 mL/min   GFR calc Af Amer >60 >60 mL/min    Comment: (NOTE) The eGFR has been calculated using the CKD EPI equation. This calculation has not been validated in all clinical situations. eGFR's persistently <60 mL/min signify possible Chronic Kidney Disease.    Anion gap 11 5 - 15    Comment: Performed at [facility] [place],  [place] [address]  CBC     Status: Abnormal   Collection Time: 07/30/17  7:05 AM  Result Value Ref Range   WBC 8.7 4.0 - 10.5 K/uL   RBC 4.03 (L) 4.22 - 5.81 MIL/uL   Hemoglobin 12.0 (L) 13.0 - 17.0 g/dL   HCT 37.5 (L) 39.0 - 52.0 %   MCV 93.1 78.0 - 100.0 fL   MCH 29.8 26.0 - 34.0 pg   MCHC 32.0 30.0 - 36.0 g/dL   RDW 13.8 11.5 - 15.5 %   Platelets 266 150 - 400 K/uL    Comment: Performed at [facility], [place] [address], [place], [place] [address]     HEENT: normal Cardio: RRR and no murmur Resp: CTA B/L and unlabored GI: BS positive and NT, ND Extremity:  No Edema Skin:   Intact Neuro: Alert/Oriented, Abnormal Sensory Reduced sensation LT, Left foot, Abnormal Motor 4/5 Left delt, Bi, Tri, grip, HF, KE, ADF, Abnormal FMC Ataxic/ dec FMC and Inattention Musc/Skel:  Other no pain with UE or LE ROM Gen NAD Left homonymous hemianopsia  Assessment/Plan: 1. Functional deficits secondary to Left hemiparesis and Left homonymous hemianopsia due to R MCA infarct which  require 3+ hours per day of interdisciplinary therapy in a comprehensive inpatient rehab setting. Physiatrist is providing close team supervision and 24 hour management of active medical problems listed below. Physiatrist and rehab team continue to assess barriers to discharge/monitor patient progress toward functional and medical goals. FIM: Function - Bathing Position: Shower Body parts bathed by patient: Right arm, Left arm, Chest, Abdomen, Front perineal area, Right upper leg, Left upper leg, Right lower leg, Left lower leg, Buttocks Body parts bathed by helper: Back Assist Level: Touching or steadying assistance(Pt > 75%)  Function- Upper Body Dressing/Undressing What is the patient wearing?: Pull over shirt/dress Pull over shirt/dress - Perfomed by patient: Thread/unthread right sleeve, Put head through opening, Pull shirt over trunk Pull over shirt/dress - Perfomed by helper: Thread/unthread left sleeve Assist Level: Touching or steadying assistance(Pt > 75%) Function - Lower Body Dressing/Undressing What is the patient wearing?: Pants, Socks, Shoes Position: Wheelchair/chair at sink Pants- Performed by patient: Thread/unthread right pants leg, Thread/unthread left pants leg Pants- Performed by helper: Pull pants up/down Non-skid slipper socks- Performed by patient: Don/doff right sock, Don/doff left sock Socks - Performed by patient: Don/doff right sock, Don/doff left sock Socks - Performed by helper: Don/doff right sock, Don/doff left sock Shoes - Performed by patient: Don/doff left shoe, Don/doff right shoe Shoes - Performed by helper: Fasten right, Fasten left Assist for footwear: Supervision/touching assist Assist for lower body dressing: Touching or steadying assistance (Pt > 75%)  Function - Toileting Toileting steps completed by patient: Adjust clothing prior to toileting Toileting steps completed by helper: Performs perineal hygiene, Adjust clothing after  toileting Assist level: Two helpers  Function - Air cabin crew transfer assistive device: Grab bar Assist level to toilet: Moderate assist (Pt 50 - 74%/lift or lower) Assist level from toilet: Moderate assist (Pt 50 - 74%/lift or lower)  Function - Chair/bed transfer Chair/bed transfer method: Squat pivot Chair/bed transfer assist level: Touching or steadying assistance (Pt > 75%) Chair/bed transfer assistive device: Armrests Chair/bed transfer details: Verbal cues for precautions/safety, Verbal cues for technique, Verbal cues for sequencing, Tactile cues for posture  Function - Locomotion: Wheelchair Will patient use wheelchair at discharge?: Yes Type: Manual Max wheelchair distance: 150 ft Assist Level: Supervision or verbal cues Assist Level: Supervision or verbal cues Assist  Level: Supervision or verbal cues Turns around,maneuvers to table,bed, and toilet,negotiates 3% grade,maneuvers on rugs and over doorsills: No Function - Locomotion: Ambulation Assistive device: Hand held assist Max distance: 19' Assist level: Moderate assist (Pt 50 - 74%) Assist level: Moderate assist (Pt 50 - 74%) Assist level: Moderate assist (Pt 50 - 74%) Walk 150 feet activity did not occur: Safety/medical concerns Walk 10 feet on uneven surfaces activity did not occur: Safety/medical concerns  Function - Comprehension Comprehension: Auditory Comprehension assist level: Understands complex 90% of the time/cues 10% of the time  Function - Expression Expression: Verbal Expression assist level: Expresses basic needs/ideas: With no assist, Expresses complex 90% of the time/cues < 10% of the time  Function - Social Interaction Social Interaction assist level: Interacts appropriately with others - No medications needed.  Function - Problem Solving Problem solving assist level: Solves basic problems with no assist, Solves complex 90% of the time/cues < 10% of the time  Function -  Memory Memory assist level: More than reasonable amount of time Patient normally able to recall (first 3 days only): Current season, Staff names and faces, That he or she is in a hospital, Location of own room  Medical Problem List and Plan: 1.   Left hemiparesis, Left homonymous hemianopsia secondary to right posterior MCA infarct.CIR CIR PT, OT,SLP, Team conference today please see physician documentation under team conference tab, met with team face-to-face to discuss problems,progress, and goals. Formulized individual treatment plan based on medical history, underlying problem and comorbidities. 2. DVT Prophylaxis/Anticoagulation: Pharmaceutical:Lovenox 3. Pain Management:N/A 4. Mood:LCSW to follow for evaluation and support. 5. Neuropsych: This patientiscapable of making decisions on hisown behalf. 6. Skin/Wound Care:routine pressure relief measures. 7. Fluids/Electrolytes/Nutrition:Monitor I/O. Offer supplements prn if intake poor. 8. HTN: Monitor BP bid. Continuetoprol XL Vitals:   07/28/17 1444 07/29/17 0552  BP: (!) 119/99 140/74  Pulse: 84 74  Resp: 18 16  Temp: 97.7 F (36.5 C) 98.9 F (37.2 C)  SpO2: 100% 100%  controlled 3/27 9.T2DM: Hgb A1C-7.6. Used Lantus with breakfast,[place]ith lunchand [place] with supper. Wife to bring in meds..Monitor BS ac/hs and continue Lantus with SSI. CBG (last 3)  Recent Labs    07/29/17 1643 07/29/17 2042 07/30/17 0629  GLUCAP 91 107* 77  Controlled 3/27 10. CKDstage III:GFR stable  11. Normocytic anemia:Monitor --will add iron supplement. 12. Hyperlipidemia: On Crestor and zetia.    LOS (Days) 4 A FACE TO FACE EVALUATION WAS PERFORMED  Charlett Blake 07/30/2017, 8:56 AM

## 2017-07-30 NOTE — Progress Notes (Signed)
Social Work Patient ID: Eric Long, male   DOB: Nov 09, 1940, 77 y.o.   MRN: [id number]  Met with pt and son to discuss team conference goals supervision level and target discharge 4/6. Both are pleased with how well he is doing and feel they can see his progress already. Aware will be seen by speech today. Son reports rails are being put up at home and gave measurement sheet to therapists. Will continue to work toward discharge date and will find out needs early next week.

## 2017-07-30 NOTE — Progress Notes (Signed)
Speech Language Pathology Daily Session Note  Patient Details  Name: Eric Long MRN: [id number] Date of Birth: 02/28/1941  Today's Date: 07/30/2017 SLP Individual Time: 1130-1200 SLP Individual Time Calculation (min): 30 min  Short Term Goals: Week 1: SLP Short Term Goal 1 (Week 1): Pt will demonstrate selective attention in moderately distracting environment for ~ 45 minutes with supervision level cues.  SLP Short Term Goal 2 (Week 1): Pt will attend to left side of environment in moderately distracting task for ~ 20minutes with Min A cues.  SLP Short Term Goal 3 (Week 1): Pt will solve complex problem solving tasks with Mod A cues.  SLP Short Term Goal 4 (Week 1): Pt will demonstrate anticipatory awareness by listing 3 activites that are safe and 3 activites that are not safe to participate in within home environment with Mod A cues.  SLP Short Term Goal 5 (Week 1): Pt willself-reflect on task to gain awareness of deficits and rank task Min, Mod or Max difficult for him to complete with Min A cues for accuracy.   Skilled Therapeutic Interventions: Skilled treatment session focused on cognition goals. SLP facilitated session by providing Mod A multimodal cues to play novel card game in its simplistic form (Blink). Pt required cues to turn cards over to reveal design, rotating card to reveal same design on card (raindrop was the same on each card). Pt able to acknowledge that task was very difficult for him. Pt with significantly increased processing times. Pt was returned to room, left upright in recliner with safety belt donned and all needs within reach. Son present. Education provided to son on difficulty with task. Continue per current plan of care.      Function:    Cognition Comprehension Comprehension assist level: Follows basic conversation/direction with no assist  Expression   Expression assist level: Expresses basic needs/ideas: With no assist;Expresses complex 90% of the  time/cues < 10% of the time  Social Interaction Social Interaction assist level: Interacts appropriately with others - No medications needed.  Problem Solving Problem solving assist level: Solves basic 90% of the time/requires cueing < 10% of the time  Memory Memory assist level: Recognizes or recalls 75 - 89% of the time/requires cueing 10 - 24% of the time;Recognizes or recalls 90% of the time/requires cueing < 10% of the time    Pain Pain Assessment Pain Score: 0-No pain  Therapy/Group: Individual Therapy  Eric Long 07/30/2017, 12:44 PM

## 2017-07-31 ENCOUNTER — Inpatient Hospital Stay (HOSPITAL_COMMUNITY): Payer: Medicare HMO | Admitting: Physical Therapy

## 2017-07-31 ENCOUNTER — Inpatient Hospital Stay (HOSPITAL_COMMUNITY): Payer: Medicare HMO | Admitting: Occupational Therapy

## 2017-07-31 ENCOUNTER — Inpatient Hospital Stay (HOSPITAL_COMMUNITY): Payer: Medicare HMO | Admitting: Speech Pathology

## 2017-07-31 LAB — GLUCOSE, CAPILLARY
GLUCOSE-CAPILLARY: 77 mg/dL (ref 65–99)
GLUCOSE-CAPILLARY: 117 mg/dL — AB (ref 65–99)
Glucose-Capillary: 68 mg/dL (ref 65–99)
Glucose-Capillary: 104 mg/dL — ABNORMAL HIGH (ref 65–99)
Glucose-Capillary: 64 mg/dL — ABNORMAL LOW (ref 65–99)
Glucose-Capillary: 93 mg/dL (ref 65–99)
Glucose-Capillary: 64 mg/dL — ABNORMAL LOW (ref 65–99)

## 2017-07-31 NOTE — Progress Notes (Signed)
Occupational Therapy Session Note  Patient Details  Name: Eric Long MRN: [id number] Date of Birth: 01-01-1941  Today's Date: 07/31/2017 OT Individual Time: 1400-1445 OT Individual Time Calculation (min): 45 min    Short Term Goals: Week 1:  OT Short Term Goal 1 (Week 1): Pt will stand at isnk for 2 grooming items to demo improved endurance OT Short Term Goal 2 (Week 1): Pt will complete toilet transfer wiht touching A OT Short Term Goal 3 (Week 1): Pt will thread BLE into pants wiht no more than 1 VC for orientation to demonstrate improved L attention/spatial awareness OT Short Term Goal 4 (Week 1): Pt will locate 4/5 objects in far L visual field with no more than min question cues Week 2:     Skilled Therapeutic Interventions/Progress Updates:    NMR with focus on transfers and functional mobility with attention to left body with multimodal cues. Sit to stands with min A. Performed stepping patterns (simulating dancing- step forwards and then backwards) with mod A for balance and increased success and safety with targets to step on. Transitioned to walking 3 musketeers with increased left UE associative reactions and extreme left LE abduction at the hip with decr awareness with narrow BOS- improved with cues to point toes forward. Also ambulated backwards 10 steps for balance, sequencing and increased body awareness with mod A +2. Transitioned back to RW to walk in a circle requiring max multimodal cues for sequencing and directionally.  Pt very challenged with turning and coordination of steps to back up to a chair. Left pt with SLP and osn.   Therapy Documentation Precautions:  Precautions Precautions: Fall, Other (comment) Precaution Comments: Left visual deficits Restrictions Weight Bearing Restrictions: No Pain: No c/o pain  See Function Navigator for Current Functional Status.   Therapy/Group: Individual Therapy  Willeen Cass [facility] 07/31/2017, 2:50 PM

## 2017-07-31 NOTE — Progress Notes (Signed)
Hypoglycemic Event  CBG: 64  Treatment: dinner  Symptoms: none  Follow-up CBG: [id number] CBG Result:77  Possible Reasons for Event: medications; diet  Comments/MD notified:Pam Love PA ; new orders noted; pt waiting for new tray     Jillyn Ledger

## 2017-07-31 NOTE — Progress Notes (Signed)
Hypoglycemic Event  CBG: 64  Treatment: lunch  Symptoms: none  Follow-up CBG: Time:1250 CBG Result:94  Possible Reasons for Event: medication; diet  Comments/MD notified:followed protocol    Jillyn Ledger

## 2017-07-31 NOTE — Progress Notes (Signed)
Occupational Therapy Session Note  Patient Details  Name: Eric Long MRN: [id number] Date of Birth: 03/11/1941  Today's Date: 07/31/2017 OT Individual Time: 1100-1200 OT Individual Time Calculation (min): 60 min    Short Term Goals: Week 1:  OT Short Term Goal 1 (Week 1): Pt will stand at isnk for 2 grooming items to demo improved endurance OT Short Term Goal 2 (Week 1): Pt will complete toilet transfer wiht touching A OT Short Term Goal 3 (Week 1): Pt will thread BLE into pants wiht no more than 1 VC for orientation to demonstrate improved L attention/spatial awareness OT Short Term Goal 4 (Week 1): Pt will locate 4/5 objects in far L visual field with no more than min question cues  Skilled Therapeutic Interventions/Progress Updates:    OT treatment session focused on modified bathing/dressing, L NMR, and L attention. Pt declined to shower today but agreeable to bathing/dressing wc level at the sink. Worked on problem solving and functional use of L UE within dressing tasks. Pt needed min cues to problem solve how to orient clothing. Min cues for forced use of L UE to pull up pants and socks. Fine motor coordination with shoe tying. B UE strengthening/coordination with wc propulsion. Hand over hand A initially to use LUE.  Dynavision activity focused on L visual scanning using head turns, functional use of L UE and L attention. Began activity sitting down with pt needing increased time to locate red lights on L side. Then had pt stand without UE support. Pt needed min A for balance but took 2x as long to locate items on L side 2/2 increased task demand of standing. Worked on integrated R and L brain with criss crossing sides with dynavision.  Therapy Documentation Precautions:  Precautions Precautions: Fall, Other (comment) Precaution Comments: Left visual deficits Restrictions Weight Bearing Restrictions: No Pain:   none/denies pain Other Treatments:    See Function Navigator  for Current Functional Status.   Therapy/Group: Individual Therapy  Valma Cava 07/31/2017, 11:39 AM

## 2017-07-31 NOTE — Progress Notes (Signed)
Speech Language Pathology Daily Session Note  Patient Details  Name: Eric Long MRN: [id number] Date of Birth: 1940/09/27  Today's Date: 07/31/2017   Skilled treatment session #1 SLP Individual Time: 1015-1100 SLP Individual Time Calculation (min): 45 min   Skilled treatment session #2 SLP Individual time: 1445-1500 SLP Individual Time Calculation (min): 15 min  Short Term Goals: Week 1: SLP Short Term Goal 1 (Week 1): Pt will demonstrate selective attention in moderately distracting environment for ~ 45 minutes with supervision level cues.  SLP Short Term Goal 2 (Week 1): Pt will attend to left side of environment in moderately distracting task for ~ 64minutes with Min A cues.  SLP Short Term Goal 3 (Week 1): Pt will solve complex problem solving tasks with Mod A cues.  SLP Short Term Goal 4 (Week 1): Pt will demonstrate anticipatory awareness by listing 3 activites that are safe and 3 activites that are not safe to participate in within home environment with Mod A cues.  SLP Short Term Goal 5 (Week 1): Pt willself-reflect on task to gain awareness of deficits and rank task Min, Mod or Max difficult for him to complete with Min A cues for accuracy.   Skilled Therapeutic Interventions:  Skilled treatment session #1 focused on cognition goals. SLP facilitated session by providing Max A multimodal cues to play Blink in its simplist form. Pt able to state sequence of thought process but unable to implement. Given significant Max A cognitive support overt last 2 sessions, SLP began administering CLQT to further assess pt's cognitive function. Pt able to complete several subtest but will continue administering for full score on next day. Pt with ability to recall directions but unable to implement during design and symbol cancellation tasks. SLP provided education to pt, his son and his wife on severity of cognitive deficits despite functional appearance.   Skilled treatment session #2 was  requested by pt's son for activities to help facilitate cognitive abilities over this evening. In light of the fact that didn't receive ST services on 3/26, SLP facilitated session with son regarding card game war. Instruction given to pt's son to allow time for pt to respond, play on pt's left and allow pt to deal. SLP played pt with intermittent cues after initial introduction of game. Pt was returned to room, left upright in wheelchair, seat alarm on and safety belt donned. Son present.      Function:  Eating Eating   Modified Consistency Diet: No Eating Assist Level: More than reasonable amount of time           Cognition Comprehension Comprehension assist level: Understands basic 90% of the time/cues < 10% of the time;Follows basic conversation/direction with extra time/assistive device  Expression   Expression assist level: Expresses basic needs/ideas: With no assist  Social Interaction Social Interaction assist level: Interacts appropriately with others - No medications needed.  Problem Solving Problem solving assist level: Solves basic 75 - 89% of the time/requires cueing 10 - 24% of the time;Solves basic 50 - 74% of the time/requires cueing 25 - 49% of the time  Memory Memory assist level: Recognizes or recalls 75 - 89% of the time/requires cueing 10 - 24% of the time;Recognizes or recalls 50 - 74% of the time/requires cueing 25 - 49% of the time    Pain    Therapy/Group: Individual Therapy  Mikeyla Music 07/31/2017, 3:02 PM

## 2017-07-31 NOTE — Progress Notes (Signed)
Patient with poor intake due to food choices--only eats beef, does not like our food, etc. Will liberalize d/c heart healthy restrictions. Will d/c pm dose Lantus due to ongoing issue with low BS

## 2017-07-31 NOTE — Plan of Care (Signed)
  Problem: RH BOWEL ELIMINATION Goal: RH STG MANAGE BOWEL WITH ASSISTANCE Description STG Manage Bowel with  Mod Assistance.  Outcome: Progressing   Problem: RH BLADDER ELIMINATION Goal: RH STG MANAGE BLADDER WITH ASSISTANCE Description STG Manage Bladder With  Mod Assistance  Outcome: Progressing   Problem: RH SAFETY Goal: RH STG ADHERE TO SAFETY PRECAUTIONS W/ASSISTANCE/DEVICE Description STG Adhere to Safety Precautions With Assistance/Device. Outcome: Progressing   Problem: Consults Goal: RH STROKE PATIENT EDUCATION Description See Patient Education module for education specifics  Outcome: Progressing

## 2017-07-31 NOTE — Progress Notes (Addendum)
Hypoglycemic Event  CBG: 68  Treatment: breakfast  Symptoms: none  Follow-up CBG: Time:0733 CBG Result:104  Possible Reasons for Event: medications; meal  Comments/MD notified:followed protocol    Jillyn Ledger

## 2017-07-31 NOTE — Progress Notes (Addendum)
Physical Therapy Session Note  Patient Details  Name: Eric Long MRN: [id number] Date of Birth: 06-10-1940  Today's Date: 07/31/2017 PT Individual Time: 0800-0900 PT Individual Time Calculation (min): 60 min   Short Term Goals: Week 1:  PT Short Term Goal 1 (Week 1): pt will ambulate with LRAD and min assist 150 ft PT Short Term Goal 2 (Week 1): Pt will ascend/descend 4 steps with min assist PT Short Term Goal 3 (Week 1): Pt will perform bed<>chair transfer with min assist  Skilled Therapeutic Interventions/Progress Updates:   Pt in supine and agreeable to therapy, denies pain. Transferred to EOB w/ supervision and provided set-up assist to don shoes w/ increased time, total assist to tie shoes for time management. Transferred to w/c via squat pivot w/ min assist and pt self-propelled w/c to gym w/ supervision using BLEs to facilitate reciprocal movement patterned. Practiced pre-gait stepping tasks in gym w/ mirror visual feedback and UE support on RW. Visual targets on ground for stepping front-left and front-right w/ LLE. Manual cues for step placement ~50% of time. Focused on consciously thinking about where LLE is in space when stepping. Ambulated 74' x2 w/ RW w/ improved LLE step placement compared to previous sessions with this therapist. Min guard for safety, mod verbal and manual cues for LLE step placement, including keeping foot pointed forward (tends to externally rotate in WB) and step length. Verbal cues for looking upright. Most verbal and manual assist required when turning to sit w/ RW. Performed 15' of backwards walking at rail in hallway w/ same verbal/manual cues required for LLE step length and foot placement. Pt ambulates w/ very slow speed w/ all walking for safety and concentration. Returned to room total assist in w/c for time management, ended session in w/c and in care of son, all needs met.   Therapy Documentation Precautions:  Precautions Precautions: Fall, Other  (comment) Precaution Comments: Left visual deficits Restrictions Weight Bearing Restrictions: No  See Function Navigator for Current Functional Status.   Therapy/Group: Individual Therapy  Davontae Prusinski K Arnette 07/31/2017, 9:27 AM

## 2017-08-01 ENCOUNTER — Inpatient Hospital Stay (HOSPITAL_COMMUNITY): Payer: Medicare HMO | Admitting: Physical Therapy

## 2017-08-01 ENCOUNTER — Inpatient Hospital Stay (HOSPITAL_COMMUNITY): Payer: Medicare HMO | Admitting: Speech Pathology

## 2017-08-01 ENCOUNTER — Inpatient Hospital Stay (HOSPITAL_COMMUNITY): Payer: Medicare HMO | Admitting: Occupational Therapy

## 2017-08-01 ENCOUNTER — Inpatient Hospital Stay (HOSPITAL_COMMUNITY): Payer: Medicare HMO

## 2017-08-01 LAB — GLUCOSE, CAPILLARY
GLUCOSE-CAPILLARY: 96 mg/dL (ref 65–99)
GLUCOSE-CAPILLARY: 77 mg/dL (ref 65–99)
GLUCOSE-CAPILLARY: 179 mg/dL — AB (ref 65–99)
Glucose-Capillary: 86 mg/dL (ref 65–99)

## 2017-08-01 NOTE — Progress Notes (Signed)
Physical Therapy Session Note  Patient Details  Name: Eric Long MRN: [id number] Date of Birth: 06-09-1940  Today's Date: 08/01/2017 PT Individual Time: 0800-0900 AND 1545-1611 PT Individual Time Calculation (min): 60 min AND 26 min  Short Term Goals: Week 1:  PT Short Term Goal 1 (Week 1): pt will ambulate with LRAD and min assist 150 ft PT Short Term Goal 2 (Week 1): Pt will ascend/descend 4 steps with min assist PT Short Term Goal 3 (Week 1): Pt will perform bed<>chair transfer with min assist  Skilled Therapeutic Interventions/Progress Updates:   Session 1:  Pt in supine and agreeable to therapy, denies pain. Transferred to EOB w/ supervision and provided set-up assist to don shoes. Max cues to find shoes sitting on L side of body. Total assist to tie shoes for time management. Transferred to w/c via squat pivot w/ min assist. Session focused on LLE NMR during functional tasks, gait training, L body awareness, and overall sequencing and motor planning. Performed side-stepping at rail in hallway, 30' w/ and 30' w/o green TB providing resistance at ankles. Focused on engaging L hip abductor musculature and decreasing L hip ER w/ stepping. Mod-max verbal, visual, and tactile cues for step placement and sequencing, utilized targets on ground and color differences in floor tiles for visual targets. Ambulated forward, 30', w/ L rail w/ visual step targets, decreased cues required for sequencing w/ forward walking compared to previous sessions w/ this therapist. Min assist overall w/ gait and side-stepping at rail to maintain upright balance. Ambulated max assist x2, 3-musketeers technique, 50' w/ visual target to increase BOS and to decrease L hip ER compensation during weight acceptance. Verbal, tactile, and manual cues for L foot placement and to inhibit L hip ER. Worked on sit<>stands at end of session w/ emphasis on technique/sequencing including foot placement, scooting forward in chair,  equal LE weight distribution during transitioning, and eccentric control when returning to sit. Mod-max tactile, verbal, manual, and visual cues for technique/sequencing. Performed multiple w/ UE push up on armrests and while holding ball in front to facilitate anterior weight shifting, both w/ min guard. Increased assistance and cues w/ fatigue. Handed off to SLP at end of session.   Session 2:  Pt in w/c and agreeable to therapy, denies pain. Total assist w/c transport to day room. Focused on LE reciprocal movement this session. Performed NuStep 10 min @ L4 to facilitate reciprocal movement and balance muscle activation. Required decreased cues for technique and attention to task than previous session w/ this therapist while performing NuStep. Pt self-propelled w/c back to room w/ BLEs to work on functional reciprocal movement pattern. Occasional verbal cues for obstacle avoidance and attention to L. Ended session in w/c, call bell within reach and all needs met. QRB and chair alarm engaged.   Therapy Documentation Precautions:  Precautions Precautions: Fall, Other (comment) Precaution Comments: Left visual deficits Restrictions Weight Bearing Restrictions: No  See Function Navigator for Current Functional Status.   Therapy/Group: Individual Therapy  Adrin Julian K Arnette 08/01/2017, 12:54 PM

## 2017-08-01 NOTE — Progress Notes (Signed)
Speech Language Pathology Weekly Progress and Session Note  Patient Details  Name: Eric Long MRN: [id number] Date of Birth: 11/24/1940  Beginning of progress report period: July 28, 2017 End of progress report period: August 01, 2017  Today's Date: 08/01/2017 SLP Individual Time: 0900-1000 SLP Individual Time Calculation (min): 60 min  Short Term Goals: Week 1: SLP Short Term Goal 1 (Week 1): Pt will demonstrate selective attention in moderately distracting environment for ~ 45 minutes with supervision level cues.  SLP Short Term Goal 1 - Progress (Week 1): Not met SLP Short Term Goal 2 (Week 1): Pt will attend to left side of environment in moderately distracting task for ~ 34mnutes with Min A cues.  SLP Short Term Goal 2 - Progress (Week 1): Not met SLP Short Term Goal 3 (Week 1): Pt will solve complex problem solving tasks with Mod A cues.  SLP Short Term Goal 3 - Progress (Week 1): Not met SLP Short Term Goal 4 (Week 1): Pt will demonstrate anticipatory awareness by listing 3 activites that are safe and 3 activites that are not safe to participate in within home environment with Mod A cues.  SLP Short Term Goal 4 - Progress (Week 1): Not met SLP Short Term Goal 5 (Week 1): Pt willself-reflect on task to gain awareness of deficits and rank task Min, Mod or Max difficult for him to complete with Min A cues for accuracy.  SLP Short Term Goal 5 - Progress (Week 1): Not met    New Short Term Goals: Week 2: SLP Short Term Goal 1 (Week 2): Pt will demonstrate selective attention in moderately distracting environment for ~ 45 minutes with Mod level cues.  SLP Short Term Goal 2 (Week 2): Pt will attend to left side of environment in moderately distracting task for ~ 30 minutes with Mod A cues.  SLP Short Term Goal 3 (Week 2): Pt will solve semi- complex problem solving tasks with Mod A cues.  SLP Short Term Goal 4 (Week 2): Pt will demonstrate intellectual awareness by listing 3  physical and 3 cognitive deficits related to acute CVA with Mod A cues.   Weekly Progress Updates: Pt with minimal progress this reporting period and as a result he has not met any of his STGs. Goals have also been downgraded to [place] d/t decreased progress. Cognitive Linguistic Quick Test Plus completed and pt's overall severity rating is MODERATE with severe deficits in attention, executive functions, moderate in clock drawing with mild deficits in language and visuospatial skills. Pt's severe deficits with attention impact his ability to complete basic tasks and demonstrate safety awareness as well as complicated by decreased attention to left. Ongoing education is being provided to pt and his family.      Intensity: Minumum of 1-2 x/day, 30 to 90 minutes Frequency: 3 to 5 out of 7 days Duration/Length of Stay: 12 to 14 days Treatment/Interventions: Cognitive remediation/compensation;Cueing hierarchy;Internal/external aids;Therapeutic Activities;Patient/family education;Functional tasks;Medication managment   Daily Session  Skilled Therapeutic Interventions: Skilled treatment session focused on cognition goals. SLP facilitated session by providing Max A cues to cognitively related on locking vs. Unlocking brakes etc. CLQT completed and scores reviewed with pt. He remains largely unaffected by results. Pt required Total A to perform scanning task. Pt was returned to room, left upright in wheelchair, chair alarm on and all needs within his reach. Extra sheets given per son's request.       Function:  Cognition Comprehension Comprehension assist level: Follows basic conversation/direction with extra time/assistive device  Expression   Expression assist level: Expresses basic needs/ideas: With no assist;Expresses basic needs/ideas: With extra time/assistive device  Social Interaction Social Interaction assist level: Interacts appropriately 75 - 89% of the time - Needs  redirection for appropriate language or to initiate interaction.;Interacts appropriately 90% of the time - Needs monitoring or encouragement for participation or interaction.  Problem Solving Problem solving assist level: Solves basic 50 - 74% of the time/requires cueing 25 - 49% of the time;Solves basic 25 - 49% of the time - needs direction more than half the time to initiate, plan or complete simple activities  Memory Memory assist level: Recognizes or recalls 25 - 49% of the time/requires cueing 50 - 75% of the time   General    Pain    Therapy/Group: Individual Therapy  Rozella Servello 08/01/2017, 12:08 PM

## 2017-08-01 NOTE — Progress Notes (Signed)
Occupational Therapy Session Note  Patient Details  Name: Eric Long MRN: [id number] Date of Birth: [birth year]  Today's Date: 08/01/2017 OT Individual Time: 1000-1100 OT Individual Time Calculation (min): 60 min   Short Term Goals: Week 1:  OT Short Term Goal 1 (Week 1): Pt will stand at isnk for 2 grooming items to demo improved endurance OT Short Term Goal 2 (Week 1): Pt will complete toilet transfer wiht touching A OT Short Term Goal 3 (Week 1): Pt will thread BLE into pants wiht no more than 1 VC for orientation to demonstrate improved L attention/spatial awareness OT Short Term Goal 4 (Week 1): Pt will locate 4/5 objects in far L visual field with no more than min question cues  Skilled Therapeutic Interventions/Progress Updates:    OT treatment session focused on modified bathing/dressing, standing balance, and functional use of L UE. Pt greeted sitting in wc and agreeable to OT. Pt request to shower. Stand-pivot into shower with min A and multimodal cues for LLE positioning when stepping. Forced use of L UE throughout bathing/dressing tasks. Addressed fine motor coordination with opening and closing containers, and tying shoes. Grip strength and coordination with wringing out wash cloths. Min questioning cues for L visual scanning to locating grooming items at the sink. Pt needed instructional cues to orient briefs and shirt today, but then was able to don clothing with supervision. Pt left seated in wc at end of session with safety belt on, chair alarm, and call bell in reach.   Therapy Documentation Precautions:  Precautions Precautions: Fall, Other (comment) Precaution Comments: Left visual deficits Restrictions Weight Bearing Restrictions: No Pain:   none/denies pain See Function Navigator for Current Functional Status.   Therapy/Group: Individual Therapy  Valma Cava 08/01/2017, 10:32 AM

## 2017-08-01 NOTE — Progress Notes (Signed)
Subjective/Complaints:  No new issues overnite, amb without walker with therapist yesterday  ROS- neg CP, SOB, N/V/D  Objective: Vital Signs: Blood pressure 139/65, pulse 85, temperature 98 F (36.7 C), temperature source Oral, resp. rate 17, height 5' 10.5" (1.791 m), weight 103 kg (227 lb 1.2 oz), SpO2 98 %. No results found. Results for orders placed or performed during the hospital encounter of 07/26/17 (from the past 72 hour(s))  Glucose, capillary     Status: None   Collection Time: 07/29/17 11:43 AM  Result Value Ref Range   Glucose-Capillary 89 65 - 99 mg/dL  Glucose, capillary     Status: None   Collection Time: 07/29/17  4:43 PM  Result Value Ref Range   Glucose-Capillary 91 65 - 99 mg/dL  Glucose, capillary     Status: Abnormal   Collection Time: 07/29/17  8:42 PM  Result Value Ref Range   Glucose-Capillary 107 (H) 65 - 99 mg/dL  Glucose, capillary     Status: None   Collection Time: 07/30/17  6:29 AM  Result Value Ref Range   Glucose-Capillary 77 65 - 99 mg/dL  Basic metabolic panel     Status: Abnormal   Collection Time: 07/30/17  7:05 AM  Result Value Ref Range   Sodium 137 135 - 145 mmol/L   Potassium 3.4 (L) 3.5 - 5.1 mmol/L   Chloride 99 (L) 101 - 111 mmol/L   CO2 27 22 - 32 mmol/L   Glucose, Bld 83 65 - 99 mg/dL   BUN 13 6 - 20 mg/dL   Creatinine, Ser 1.20 0.61 - 1.24 mg/dL   Calcium 9.1 8.9 - 10.3 mg/dL   GFR calc non Af Amer 56 (L) >60 mL/min   GFR calc Af Amer >60 >60 mL/min    Comment: (NOTE) The eGFR has been calculated using the CKD EPI equation. This calculation has not been validated in all clinical situations. eGFR's persistently <60 mL/min signify possible Chronic Kidney Disease.    Anion gap 11 5 - 15    Comment: Performed at [facility] [address] [place], [place] [address]  CBC     Status: Abnormal   Collection Time: 07/30/17  7:05 AM  Result Value Ref Range   WBC 8.7 4.0 - 10.5 K/uL   RBC 4.03 (L) 4.22 - 5.81 MIL/uL    Hemoglobin 12.0 (L) 13.0 - 17.0 g/dL   HCT 37.5 (L) 39.0 - 52.0 %   MCV 93.1 78.0 - 100.0 fL   MCH 29.8 26.0 - 34.0 pg   MCHC 32.0 30.0 - 36.0 g/dL   RDW 13.8 11.5 - 15.5 %   Platelets 266 150 - 400 K/uL    Comment: Performed at [facility], [place] [address] [place], [place] [address]  Glucose, capillary     Status: None   Collection Time: 07/30/17 11:37 AM  Result Value Ref Range   Glucose-Capillary 81 65 - 99 mg/dL  Glucose, capillary     Status: None   Collection Time: 07/30/17  4:41 PM  Result Value Ref Range   Glucose-Capillary 79 65 - 99 mg/dL  Glucose, capillary     Status: Abnormal   Collection Time: 07/30/17  8:34 PM  Result Value Ref Range   Glucose-Capillary 100 (H) 65 - 99 mg/dL  Glucose, capillary     Status: None   Collection Time: 07/31/17  6:55 AM  Result Value Ref Range   Glucose-Capillary 68 65 - 99 mg/dL  Glucose,  capillary     Status: Abnormal   Collection Time: 07/31/17  7:33 AM  Result Value Ref Range   Glucose-Capillary 104 (H) 65 - 99 mg/dL   Comment 1 Notify RN   Glucose, capillary     Status: Abnormal   Collection Time: 07/31/17 12:02 PM  Result Value Ref Range   Glucose-Capillary 64 (L) 65 - 99 mg/dL   Comment 1 Notify RN   Glucose, capillary     Status: None   Collection Time: 07/31/17 12:50 PM  Result Value Ref Range   Glucose-Capillary 93 65 - 99 mg/dL   Comment 1 Notify RN   Glucose, capillary     Status: Abnormal   Collection Time: 07/31/17  4:46 PM  Result Value Ref Range   Glucose-Capillary 64 (L) 65 - 99 mg/dL   Comment 1 Notify RN   Glucose, capillary     Status: None   Collection Time: 07/31/17  6:12 PM  Result Value Ref Range   Glucose-Capillary 77 65 - 99 mg/dL   Comment 1 Notify RN   Glucose, capillary     Status: Abnormal   Collection Time: 07/31/17  9:32 PM  Result Value Ref Range   Glucose-Capillary 117 (H) 65 - 99 mg/dL   Comment 1 Notify RN   Glucose, capillary     Status: None   Collection Time: 08/01/17   6:35 AM  Result Value Ref Range   Glucose-Capillary 86 65 - 99 mg/dL   Comment 1 Notify RN      HEENT: normal Cardio: RRR and no murmur Resp: CTA B/L and unlabored GI: BS positive and NT, ND Extremity:  No Edema Skin:   Intact Neuro: Alert/Oriented, Abnormal Sensory Reduced sensation LT, Left foot, Abnormal Motor 4/5 Left delt, Bi, Tri, grip, HF, KE, ADF, Abnormal FMC Ataxic/ dec FMC and Inattention , Left side Musc/Skel:  Other no pain with UE or LE ROM Gen NAD Left homonymous hemianopsia  Assessment/Plan: 1. Functional deficits secondary to Left hemiparesis and Left homonymous hemianopsia due to R MCA infarct which require 3+ hours per day of interdisciplinary therapy in a comprehensive inpatient rehab setting. Physiatrist is providing close team supervision and 24 hour management of active medical problems listed below. Physiatrist and rehab team continue to assess barriers to discharge/monitor patient progress toward functional and medical goals. FIM: Function - Bathing Position: Shower Body parts bathed by patient: Right arm, Left arm, Chest, Abdomen, Front perineal area, Right upper leg, Left upper leg, Right lower leg, Left lower leg, Buttocks Body parts bathed by helper: Back Assist Level: Touching or steadying assistance(Pt > 75%)  Function- Upper Body Dressing/Undressing What is the patient wearing?: Pull over shirt/dress Pull over shirt/dress - Perfomed by patient: Thread/unthread right sleeve, Put head through opening, Pull shirt over trunk, Thread/unthread left sleeve Pull over shirt/dress - Perfomed by helper: Thread/unthread left sleeve Assist Level: Supervision or verbal cues Function - Lower Body Dressing/Undressing What is the patient wearing?: Pants, Underwear, Shoes, Socks Position: Wheelchair/chair at [employer] - Performed by patient: Thread/unthread right underwear leg, Thread/unthread left underwear leg, Pull underwear up/down Pants- Performed by  patient: Thread/unthread right pants leg, Thread/unthread left pants leg, Pull pants up/down Pants- Performed by helper: Pull pants up/down Non-skid slipper socks- Performed by patient: Don/doff right sock, Don/doff left sock Socks - Performed by patient: Don/doff right sock, Don/doff left sock Socks - Performed by helper: Don/doff right sock, Don/doff left sock Shoes - Performed by patient: Don/doff right shoe, Don/doff left shoe,  Fasten right, Fasten left Shoes - Performed by helper: Fasten right, Fasten left Assist for footwear: Supervision/touching assist Assist for lower body dressing: Touching or steadying assistance (Pt > 75%)  Function - Toileting Toileting steps completed by patient: Adjust clothing prior to toileting, Performs perineal hygiene, Adjust clothing after toileting Toileting steps completed by helper: Performs perineal hygiene, Adjust clothing after toileting Assist level: Touching or steadying assistance (Pt.75%)  Function - Toilet Transfers Toilet transfer assistive device: Grab bar Assist level to toilet: Moderate assist (Pt 50 - 74%/lift or lower) Assist level from toilet: Moderate assist (Pt 50 - 74%/lift or lower)  Function - Chair/bed transfer Chair/bed transfer method: Stand pivot Chair/bed transfer assist level: Touching or steadying assistance (Pt > 75%) Chair/bed transfer assistive device: Walker, Armrests Chair/bed transfer details: Verbal cues for precautions/safety, Verbal cues for technique, Verbal cues for sequencing, Tactile cues for posture  Function - Locomotion: Wheelchair Will patient use wheelchair at discharge?: Yes Type: Manual Max wheelchair distance: 150 ft Assist Level: Supervision or verbal cues Assist Level: Supervision or verbal cues Assist Level: Supervision or verbal cues Turns around,maneuvers to table,bed, and toilet,negotiates 3% grade,maneuvers on rugs and over doorsills: No Function - Locomotion: Ambulation Assistive device:  Walker-rolling Max distance: 19' Assist level: Touching or steadying assistance (Pt > 75%) Assist level: Touching or steadying assistance (Pt > 75%) Assist level: Touching or steadying assistance (Pt > 75%) Walk 150 feet activity did not occur: Safety/medical concerns Walk 10 feet on uneven surfaces activity did not occur: Safety/medical concerns  Function - Comprehension Comprehension: Auditory Comprehension assist level: Follows basic conversation/direction with no assist  Function - Expression Expression: Verbal Expression assist level: Expresses basic needs/ideas: With no assist  Function - Social Interaction Social Interaction assist level: Interacts appropriately with others - No medications needed.  Function - Problem Solving Problem solving assist level: Solves basic 75 - 89% of the time/requires cueing 10 - 24% of the time, Solves basic 50 - 74% of the time/requires cueing 25 - 49% of the time  Function - Memory Memory assist level: Recognizes or recalls 75 - 89% of the time/requires cueing 10 - 24% of the time, Recognizes or recalls 50 - 74% of the time/requires cueing 25 - 49% of the time Patient normally able to recall (first 3 days only): Current season, Staff names and faces, That he or she is in a hospital, Location of own room  Medical Problem List and Plan: 1.   Left hemiparesis, Left homonymous hemianopsia secondary to right posterior MCA infarct.CIR CIR PT, OT,SLP,2. DVT Prophylaxis/Anticoagulation: Pharmaceutical:Lovenox 3. Pain Management:N/A 4. Mood:LCSW to follow for evaluation and support. 5. Neuropsych: This patientiscapable of making decisions on hisown behalf. 6. Skin/Wound Care:routine pressure relief measures. 7. Fluids/Electrolytes/Nutrition:Monitor I/O. Offer supplements prn if intake poor. 8. HTN: Monitor BP bid. Continuetoprol XL Vitals:   07/31/17 1546 08/01/17 0500  BP: 139/79 139/65  Pulse: 75 85  Resp: 19 17  Temp: 97.9 F  (36.6 C) 98 F (36.7 C)  SpO2: 99% 98%  controlled 3/29 9.T2DM: Hgb A1C-7.6. Used Lantus with breakfast,Victoz[place]ith lunchand [place] with supper. Wife to bring in meds..Monitor BS ac/hs and continue Lantus with SSI. CBG (last 3)  Recent Labs    07/31/17 1812 07/31/17 2132 08/01/17 0635  GLUCAP 77 117* 86  Controlled 3/29 10. CKDstage III:GFR stable  11. Normocytic anemia:Monitor --will add iron supplement. 12. Hyperlipidemia: On Crestor and zetia.    LOS (Days) 6 A FACE TO FACE EVALUATION WAS PERFORMED  Luanna Salk Hanzel Pizzo  08/01/2017, 7:40 AM

## 2017-08-02 ENCOUNTER — Inpatient Hospital Stay (HOSPITAL_COMMUNITY): Payer: Medicare HMO | Admitting: Speech Pathology

## 2017-08-02 ENCOUNTER — Inpatient Hospital Stay (HOSPITAL_COMMUNITY): Payer: Medicare HMO | Admitting: Occupational Therapy

## 2017-08-02 LAB — GLUCOSE, CAPILLARY
GLUCOSE-CAPILLARY: 82 mg/dL (ref 65–99)
GLUCOSE-CAPILLARY: 83 mg/dL (ref 65–99)
Glucose-Capillary: 83 mg/dL (ref 65–99)
Glucose-Capillary: 77 mg/dL (ref 65–99)
Glucose-Capillary: 92 mg/dL (ref 65–99)

## 2017-08-02 MED ORDER — AMLODIPINE BESYLATE 2.5 MG PO TABS
2.5000 mg | ORAL_TABLET | Freq: Every day | ORAL | Status: DC
  Administered 2017-08-02 – 2017-08-09 (×8): 2.5 mg via ORAL
  Filled 2017-08-02 (×8): qty 1

## 2017-08-02 NOTE — Progress Notes (Signed)
Subjective/Complaints:  No issues overnite  ROS- neg CP, SOB, N/V/D  Objective: Vital Signs: Blood pressure (!) 143/79, pulse 85, temperature (!) 97.5 F (36.4 C), temperature source Oral, resp. rate 18, height 5' 10.5" (1.791 m), weight 103 kg (227 lb 1.2 oz), SpO2 99 %. No results found. Results for orders placed or performed during the hospital encounter of 07/26/17 (from the past 72 hour(s))  Glucose, capillary     Status: None   Collection Time: 07/30/17 11:37 AM  Result Value Ref Range   Glucose-Capillary 81 65 - 99 mg/dL  Glucose, capillary     Status: None   Collection Time: 07/30/17  4:41 PM  Result Value Ref Range   Glucose-Capillary 79 65 - 99 mg/dL  Glucose, capillary     Status: Abnormal   Collection Time: 07/30/17  8:34 PM  Result Value Ref Range   Glucose-Capillary 100 (H) 65 - 99 mg/dL  Glucose, capillary     Status: None   Collection Time: 07/31/17  6:55 AM  Result Value Ref Range   Glucose-Capillary 68 65 - 99 mg/dL  Glucose, capillary     Status: Abnormal   Collection Time: 07/31/17  7:33 AM  Result Value Ref Range   Glucose-Capillary 104 (H) 65 - 99 mg/dL   Comment 1 Notify RN   Glucose, capillary     Status: Abnormal   Collection Time: 07/31/17 12:02 PM  Result Value Ref Range   Glucose-Capillary 64 (L) 65 - 99 mg/dL   Comment 1 Notify RN   Glucose, capillary     Status: None   Collection Time: 07/31/17 12:50 PM  Result Value Ref Range   Glucose-Capillary 93 65 - 99 mg/dL   Comment 1 Notify RN   Glucose, capillary     Status: Abnormal   Collection Time: 07/31/17  4:46 PM  Result Value Ref Range   Glucose-Capillary 64 (L) 65 - 99 mg/dL   Comment 1 Notify RN   Glucose, capillary     Status: None   Collection Time: 07/31/17  6:12 PM  Result Value Ref Range   Glucose-Capillary 77 65 - 99 mg/dL   Comment 1 Notify RN   Glucose, capillary     Status: Abnormal   Collection Time: 07/31/17  9:32 PM  Result Value Ref Range   Glucose-Capillary 117  (H) 65 - 99 mg/dL   Comment 1 Notify RN   Glucose, capillary     Status: None   Collection Time: 08/01/17  6:35 AM  Result Value Ref Range   Glucose-Capillary 86 65 - 99 mg/dL   Comment 1 Notify RN   Glucose, capillary     Status: None   Collection Time: 08/01/17 11:27 AM  Result Value Ref Range   Glucose-Capillary 96 65 - 99 mg/dL  Glucose, capillary     Status: None   Collection Time: 08/01/17  4:35 PM  Result Value Ref Range   Glucose-Capillary 77 65 - 99 mg/dL  Glucose, capillary     Status: Abnormal   Collection Time: 08/01/17  9:35 PM  Result Value Ref Range   Glucose-Capillary 179 (H) 65 - 99 mg/dL  Glucose, capillary     Status: None   Collection Time: 08/02/17  6:22 AM  Result Value Ref Range   Glucose-Capillary 82 65 - 99 mg/dL     HEENT: normal Cardio: RRR and no murmur Resp: CTA B/L and unlabored GI: BS positive and NT, ND Extremity:  No Edema Skin:   Intact  Neuro: Alert/Oriented, Abnormal Sensory Reduced sensation LT, Left foot, Abnormal Motor 4/5 Left delt, Bi, Tri, grip, HF, KE, ADF, Abnormal FMC Ataxic/ dec FMC and Inattention , Left side Musc/Skel:  Other no pain with UE or LE ROM Gen NAD Left homonymous hemianopsia  Assessment/Plan: 1. Functional deficits secondary to Left hemiparesis and Left homonymous hemianopsia due to R MCA infarct which require 3+ hours per day of interdisciplinary therapy in a comprehensive inpatient rehab setting. Physiatrist is providing close team supervision and 24 hour management of active medical problems listed below. Physiatrist and rehab team continue to assess barriers to discharge/monitor patient progress toward functional and medical goals. FIM: Function - Bathing Position: Shower Body parts bathed by patient: Right arm, Left arm, Chest, Abdomen, Front perineal area, Right upper leg, Left upper leg, Right lower leg, Left lower leg, Buttocks Body parts bathed by helper: Back Assist Level: Touching or steadying  assistance(Pt > 75%)  Function- Upper Body Dressing/Undressing What is the patient wearing?: Pull over shirt/dress Pull over shirt/dress - Perfomed by patient: Thread/unthread right sleeve, Put head through opening, Pull shirt over trunk, Thread/unthread left sleeve Pull over shirt/dress - Perfomed by helper: Thread/unthread left sleeve Assist Level: Supervision or verbal cues Function - Lower Body Dressing/Undressing What is the patient wearing?: Pants, Underwear, Shoes, Socks Position: Wheelchair/chair at [employer] - Performed by patient: Thread/unthread right underwear leg, Thread/unthread left underwear leg, Pull underwear up/down Pants- Performed by patient: Thread/unthread right pants leg, Thread/unthread left pants leg, Pull pants up/down Pants- Performed by helper: Pull pants up/down Non-skid slipper socks- Performed by patient: Don/doff right sock, Don/doff left sock Socks - Performed by patient: Don/doff right sock, Don/doff left sock Socks - Performed by helper: Don/doff right sock, Don/doff left sock Shoes - Performed by patient: Don/doff left shoe, Don/doff right shoe, Fasten right, Fasten left Shoes - Performed by helper: Fasten right, Fasten left Assist for footwear: Supervision/touching assist Assist for lower body dressing: Touching or steadying assistance (Pt > 75%)  Function - Toileting Toileting steps completed by patient: Adjust clothing prior to toileting, Performs perineal hygiene, Adjust clothing after toileting Toileting steps completed by helper: Performs perineal hygiene, Adjust clothing after toileting Assist level: Touching or steadying assistance (Pt.75%)  Function - Toilet Transfers Toilet transfer assistive device: Grab bar Assist level to toilet: Moderate assist (Pt 50 - 74%/lift or lower) Assist level from toilet: Moderate assist (Pt 50 - 74%/lift or lower)  Function - Chair/bed transfer Chair/bed transfer method: Stand pivot Chair/bed transfer  assist level: Touching or steadying assistance (Pt > 75%) Chair/bed transfer assistive device: Armrests Chair/bed transfer details: Verbal cues for precautions/safety, Verbal cues for technique, Verbal cues for sequencing, Tactile cues for posture  Function - Locomotion: Wheelchair Will patient use wheelchair at discharge?: Yes Type: Manual Max wheelchair distance: 150 ft Assist Level: Supervision or verbal cues Assist Level: Supervision or verbal cues Assist Level: Supervision or verbal cues Turns around,maneuvers to table,bed, and toilet,negotiates 3% grade,maneuvers on rugs and over doorsills: No Function - Locomotion: Ambulation Assistive device: Other (comment)("3 musketeers") Max distance: 10' Assist level: 2 helpers Assist level: 2 helpers Assist level: 2 helpers Walk 150 feet activity did not occur: Safety/medical concerns Walk 10 feet on uneven surfaces activity did not occur: Safety/medical concerns  Function - Comprehension Comprehension: Auditory Comprehension assist level: Follows basic conversation/direction with no assist  Function - Expression Expression: Verbal Expression assist level: Expresses basic needs/ideas: With no assist  Function - Social Interaction Social Interaction assist level: Interacts appropriately with others -  No medications needed.  Function - Problem Solving Problem solving assist level: Solves basic 90% of the time/requires cueing < 10% of the time  Function - Memory Memory assist level: Recognizes or recalls 75 - 89% of the time/requires cueing 10 - 24% of the time, Recognizes or recalls 90% of the time/requires cueing < 10% of the time Patient normally able to recall (first 3 days only): Current season, Staff names and faces, That he or she is in a hospital, Location of own room  Medical Problem List and Plan: 1.   Left hemiparesis, Left homonymous hemianopsia secondary to right posterior MCA infarct.CIR CIR PT, OT,SLP,2. DVT  Prophylaxis/Anticoagulation: Pharmaceutical:Lovenox 3. Pain Management:N/A 4. Mood:LCSW to follow for evaluation and support. 5. Neuropsych: This patientiscapable of making decisions on hisown behalf. 6. Skin/Wound Care:routine pressure relief measures. 7. Fluids/Electrolytes/Nutrition:Monitor I/O. Offer supplements prn if intake poor. 8. HTN: Monitor BP bid. Continuetoprol XL Vitals:   08/01/17 1615 08/02/17 0454  BP: (!) 151/79 (!) 143/79  Pulse: 86 85  Resp:  18  Temp: 98.3 F (36.8 C) (!) 97.5 F (36.4 C)  SpO2: 100% 99%  uncontrolled 3/30, on amlodipine /benzapril at home, restart low dose amlodipine 9.T2DM: Hgb A1C-7.6. Used Lantus with breakfast,[place]oz[place]ith lunchand [place] with supper. Wife to bring in meds..Monitor BS ac/hs and continue Lantus with SSI. CBG (last 3)  Recent Labs    08/01/17 1635 08/01/17 2135 08/02/17 0622  GLUCAP 77 179* 82  Controlled 3/30 10. CKDstage III:GFR stable  11. Normocytic anemia:Monitor --will add iron supplement. 12. Hyperlipidemia: On Crestor and zetia.    LOS (Days) 7 A FACE TO FACE EVALUATION WAS PERFORMED  Charlett Blake 08/02/2017, 9:33 AM

## 2017-08-02 NOTE — Plan of Care (Signed)
  Problem: RH BOWEL ELIMINATION Goal: RH STG MANAGE BOWEL WITH ASSISTANCE Description STG Manage Bowel with  Mod Assistance.  Outcome: Progressing Goal: RH STG MANAGE BOWEL W/MEDICATION W/ASSISTANCE Description STG Manage Bowel with Medication with Mod Assistance.  Outcome: Progressing Goal: RH OTHER STG BOWEL ELIMINATION GOALS W/ASSIST Description Other STG Bowel Elimination Goals With Mod Assistance.  Outcome: Progressing   Problem: RH BLADDER ELIMINATION Goal: RH STG MANAGE BLADDER WITH ASSISTANCE Description STG Manage Bladder With  Mod Assistance  Outcome: Progressing Goal: RH STG MANAGE BLADDER WITH MEDICATION WITH ASSISTANCE Description STG Manage Bladder With Medication With  Mod Assistance.  Outcome: Progressing Goal: RH OTHER STG BLADDER ELIMINATION GOALS W/ASSIST Description Other STG Bladder Elimination Goals With  Mod Assistance  Outcome: Progressing   Problem: RH SAFETY Goal: RH STG ADHERE TO SAFETY PRECAUTIONS W/ASSISTANCE/DEVICE Description STG Adhere to Safety Precautions With mod Assistance/Device.  Outcome: Progressing Goal: RH STG DECREASED RISK OF FALL WITH ASSISTANCE Description STG Decreased Risk of Fall With  modAssistance.  Outcome: Progressing Goal: RH STG DEMO UNDERSTANDING HOME SAFETY PRECAUTIONS Outcome: Progressing Goal: RH OTHER STG SAFETY GOALS W/ASSIST Description Other STG Safety Goals With mod Assistance.  Outcome: Progressing   Problem: Consults Goal: RH STROKE PATIENT EDUCATION Description See Patient Education module for education specifics  Outcome: Progressing

## 2017-08-02 NOTE — Progress Notes (Signed)
Speech Language Pathology Daily Session Note  Patient Details  Name: Eric Long MRN: [id number] Date of Birth: 02-May-1941  Today's Date: 08/02/2017 SLP Individual Time: 0947-1020 SLP Individual Time Calculation (min): 33 min  Short Term Goals: Week 2: SLP Short Term Goal 1 (Week 2): Pt will demonstrate selective attention in moderately distracting environment for ~ 45 minutes with Mod level cues.  SLP Short Term Goal 2 (Week 2): Pt will attend to left side of environment in moderately distracting task for ~ 30 minutes with Mod A cues.  SLP Short Term Goal 3 (Week 2): Pt will solve semi- complex problem solving tasks with Mod A cues.  SLP Short Term Goal 4 (Week 2): Pt will demonstrate intellectual awareness by listing 3 physical and 3 cognitive deficits related to acute CVA with Mod A cues.   Skilled Therapeutic Interventions:   Pt was seen for skilled ST targeting cognitive goals.  Upon arrival, pt had one leg hanging over the left side of his bed and multiple items had been pushed off the right side of his bed including the call bell.  Pt was repositioned safely in bed.    SLP facilitated the session with therapeutic use of self to facilitate scanning to the left of midline by sitting on pt's left.  During functional conversations with therapist, pt was able to identify at least 3 physical and cognitive deficits post CVA with supervision question cues but needed mod assist to identify how those deficits will impact independence in the home environment.  Pt specifically reports decreased attention to task post CVA and was able to identify 3 distraction management techniques with mod assist verbal cues.  Pt was noted to become tangential throughout conversations and needed min-mod verbal cues for redirection to topic at hand.  Pt's daughter arrived at the end of today's therapy session and was updated regarding pt's current goals and progress in therapies as well as provided with education  regarding distraction managemnet and visual scanning strategies.  Pt was left in bed with call bell within reach and bed alarm set.  Continue per current plan of care.     Function:  Eating Eating                 Cognition Comprehension Comprehension assist level: Follows basic conversation/direction with no assist  Expression   Expression assist level: Expresses basic needs/ideas: With no assist  Social Interaction Social Interaction assist level: Interacts appropriately 90% of the time - Needs monitoring or encouragement for participation or interaction.  Problem Solving Problem solving assist level: Solves basic 50 - 74% of the time/requires cueing 25 - 49% of the time  Memory Memory assist level: Recognizes or recalls 75 - 89% of the time/requires cueing 10 - 24% of the time    Pain Pain Assessment Pain Scale: 0-10 Pain Score: 0-No pain  Therapy/Group: Individual Therapy  Bronislaus Verdell, Selinda Orion 08/02/2017, 12:51 PM

## 2017-08-02 NOTE — Progress Notes (Signed)
Occupational Therapy Session Note  Patient Details  Name: Eric Long MRN: [id number] Date of Birth: 12/06/1940  Today's Date: 08/02/2017 OT Group Time: 1300-1400 OT Group Time Calculation (min): 60 min  Short Term Goals: Week 1:  OT Short Term Goal 1 (Week 1): Pt will stand at isnk for 2 grooming items to demo improved endurance OT Short Term Goal 2 (Week 1): Pt will complete toilet transfer wiht touching A OT Short Term Goal 3 (Week 1): Pt will thread BLE into pants wiht no more than 1 VC for orientation to demonstrate improved L attention/spatial awareness OT Short Term Goal 4 (Week 1): Pt will locate 4/5 objects in far L visual field with no more than min question cues  Skilled Therapeutic Interventions/Progress Updates:    Pt participated in w/c level therapeutic dance group. Tx focus on Lt NMR/attention, UE coordination, trunk control, social participation and activity tolerance. Pt attending to therapist providing instruction on his Lt side. Worked on [employer] via bilaterally integrative clapping and alternating thigh taps. Increased balance/trunk challenges when sitting unsupported and clapping overhead. Pt initiating conversations with other members in group, engaged and actively participating throughout. At end of tx spouse escorted him back to room.     Therapy Documentation Precautions:  Precautions Precautions: Fall, Other (comment) Precaution Comments: Left visual deficits Restrictions Weight Bearing Restrictions: No Vital Signs: Therapy Vitals Temp: 98.1 F (36.7 C) Temp Source: Oral Pulse Rate: 70 Resp: 19 BP: (!) 145/44 Patient Position (if appropriate): Sitting Oxygen Therapy SpO2: 100 % O2 Device: Room Air     See Function Navigator for Current Functional Status.   Therapy/Group: Group Therapy  Alita Waldren A Neco Kling 08/02/2017, 5:14 PM

## 2017-08-03 ENCOUNTER — Inpatient Hospital Stay (HOSPITAL_COMMUNITY): Payer: Medicare HMO

## 2017-08-03 LAB — GLUCOSE, CAPILLARY
GLUCOSE-CAPILLARY: 79 mg/dL (ref 65–99)
Glucose-Capillary: 90 mg/dL (ref 65–99)
Glucose-Capillary: 75 mg/dL (ref 65–99)
Glucose-Capillary: 103 mg/dL — ABNORMAL HIGH (ref 65–99)

## 2017-08-03 NOTE — Progress Notes (Signed)
Physical Therapy Session Note  Patient Details  Name: Eric Long MRN: [id number] Date of Birth: April 29, 1941  Today's Date: 08/03/2017 PT Individual Time: 0915-1015 PT Individual Time Calculation (min): 60 min   Short Term Goals: Week 1:  PT Short Term Goal 1 (Week 1): pt will ambulate with LRAD and min assist 150 ft PT Short Term Goal 2 (Week 1): Pt will ascend/descend 4 steps with min assist PT Short Term Goal 3 (Week 1): Pt will perform bed<>chair transfer with min assist  Skilled Therapeutic Interventions/Progress Updates:    Pt seated in w/c working on dressing with NT upon PT arrival, agreeable to therapy tx and denies pain. Pt performed upper body dressing with supervision and verbal cues for orientation, pt performed lower body dressing with supervision, performed sit<>stand using grab bar and min assist, min assist for balance in standing to pull pants over hips. Pt donned shoes and socks with supervision, pt transported to gym in w/c. Pt participated in gait training this session using the litegait for BWS overground training. Pt ambulated x232 ft and x300 ft using litegait, therapist providing HHA and verbal cues for increased gait speed, decreased L hip external rotation, and cues for foot placement. While in [place] pt also worked on 360 degree turns in each direction x 3, verbal cues for foot placement and techniques. Pt ambulated x 200 ft, 3 musketeers back to his room, assessing for carry over, verbal cues for gait speed and foot placement. Once in the room pt performed 360 degree turn x 1 in each direction with mod assist from therapist. Pt left seated in recliner with needs in reach, QRB in place and chair alarm set.   Therapy Documentation Precautions:  Precautions Precautions: Fall, Other (comment) Precaution Comments: Left visual deficits Restrictions Weight Bearing Restrictions: No  See Function Navigator for Current Functional Status.   Therapy/Group: Individual  Therapy  Netta Corrigan, PT, DPT 08/03/2017, 7:59 AM

## 2017-08-03 NOTE — Plan of Care (Signed)
  Problem: RH BOWEL ELIMINATION Goal: RH STG MANAGE BOWEL WITH ASSISTANCE Description STG Manage Bowel with  Mod Assistance.  Outcome: Progressing Goal: RH STG MANAGE BOWEL W/MEDICATION W/ASSISTANCE Description STG Manage Bowel with Medication with Mod Assistance.  Outcome: Progressing Goal: RH OTHER STG BOWEL ELIMINATION GOALS W/ASSIST Description Other STG Bowel Elimination Goals With Mod Assistance.  Outcome: Progressing   Problem: RH BLADDER ELIMINATION Goal: RH STG MANAGE BLADDER WITH ASSISTANCE Description STG Manage Bladder With  Mod Assistance  Outcome: Progressing Goal: RH STG MANAGE BLADDER WITH MEDICATION WITH ASSISTANCE Description STG Manage Bladder With Medication With  Mod Assistance.  Outcome: Progressing Goal: RH OTHER STG BLADDER ELIMINATION GOALS W/ASSIST Description Other STG Bladder Elimination Goals With  Mod Assistance  Outcome: Progressing   Problem: RH SAFETY Goal: RH STG ADHERE TO SAFETY PRECAUTIONS W/ASSISTANCE/DEVICE Description STG Adhere to Safety Precautions With mod Assistance/Device.  Outcome: Progressing Goal: RH STG DECREASED RISK OF FALL WITH ASSISTANCE Description STG Decreased Risk of Fall With  modAssistance.  Outcome: Progressing Goal: RH STG DEMO UNDERSTANDING HOME SAFETY PRECAUTIONS Outcome: Progressing Goal: RH OTHER STG SAFETY GOALS W/ASSIST Description Other STG Safety Goals With mod Assistance.  Outcome: Progressing   Problem: Consults Goal: RH STROKE PATIENT EDUCATION Description See Patient Education module for education specifics  Outcome: Progressing

## 2017-08-04 ENCOUNTER — Inpatient Hospital Stay (HOSPITAL_COMMUNITY): Payer: Medicare HMO

## 2017-08-04 ENCOUNTER — Inpatient Hospital Stay (HOSPITAL_COMMUNITY): Payer: Medicare HMO | Admitting: Speech Pathology

## 2017-08-04 ENCOUNTER — Inpatient Hospital Stay (HOSPITAL_COMMUNITY): Payer: Medicare HMO | Admitting: Occupational Therapy

## 2017-08-04 LAB — GLUCOSE, CAPILLARY
GLUCOSE-CAPILLARY: 85 mg/dL (ref 65–99)
Glucose-Capillary: 128 mg/dL — ABNORMAL HIGH (ref 65–99)
Glucose-Capillary: 87 mg/dL (ref 65–99)
Glucose-Capillary: 81 mg/dL (ref 65–99)

## 2017-08-04 NOTE — Progress Notes (Signed)
Physical Therapy Weekly Progress Note  Patient Details  Name: Eric Long MRN: [id number] Date of Birth: 10/28/1940  Beginning of progress report period: July 27, 2017 End of progress report period: August 04, 2017  Today's Date: 08/04/2017 PT Individual Time: 1100-1155, 1505-1600 PT Individual Time Calculation (min): 55 min , 55 min   Patient has met 2 of 3 short term goals.  Pt is progressing towards goals and is ambulating at a min assist level with RW, mod assist level without RW. Pt performed sit<>stands with supervision and use of RW, pt performs transfers with min assist. Pt continues to be limited by ataxia, poor proprioception, L visual field cut and L inattention, requiring max verbal and tactile cues for all functional mobility.   Patient continues to demonstrate the following deficits ataxia, decreased coordination and decreased motor planning, decreased awareness and decreased standing balance, decreased postural control and decreased balance strategies and therefore will continue to benefit from skilled PT intervention to increase functional independence with mobility.  Patient progressing toward long term goals..  Continue plan of care.   PT Short Term Goals Week 1:  PT Short Term Goal 1 (Week 1): pt will ambulate with LRAD and min assist 150 ft PT Short Term Goal 1 - Progress (Week 1): Met PT Short Term Goal 2 (Week 1): Pt will ascend/descend 4 steps with min assist PT Short Term Goal 2 - Progress (Week 1): Progressing toward goal PT Short Term Goal 3 (Week 1): Pt will perform bed<>chair transfer with min assist PT Short Term Goal 3 - Progress (Week 1): Met Week 2:  PT Short Term Goal 1 (Week 2): STG=LTG due to ELOS  Skilled Therapeutic Interventions/Progress Updates:    Session 1: Pt seated in w/c upon PT arrival, agreeable to therapy tx and denies pain. Pt transported from room>gym. Pt ambulated x 100 ft without AD mod assist, holding onto therapists elbows for UE  support, mod assist, verbal cues for foot placement and decreased L LE external rotation. Pt ambulated x 150 ft with RW and min assist, max verbal cues for foot placement, step length, gait speed, L sided awareness and obstacle avoidance. Pt performed sit<>stands throughout session with supervision and use of RW/armrests. Pt worked on Diet[employer] with UE support holding therapist elbows to perform side stepping and backwards stepping, mod assist. Pt ascended/descended 4 steps x 2 with both hands on L handrail, verbal cues for techniques and foot placement, mod assist. Pt transported back to room and left seated in w/c with needs in reach, QRB in place.   Session 2: Pt seated  in w/c upon PT arrival, agreeable to therapy tx and denies pain. Pt transported from room>gym. Session focused on gait training with quad cane and with RW. Wife present this session to observe, therapist also providing education regarding pt progress, d/c planning and recommendations, will set up family education towards the end of the week. Pt ambulated 2 x 200 ft with quad cane and mod assist, improved step length and foot placement this session, with decreased LOB, pt still requiring verbal cues for obstacel avoidance and turning. Pt ambulated x 200 ft with RW and min assist, improved gait overall with more fluid gait pattern and improved steps compared to this morning. Pt ascended/descended 4 steps with B UE support on single handrail min assist, pt able to verbalize techniques from this mornings session, continues to require cues for foot placement and L awareness. Pt transported back to room and left seated  with needs in reach, wife present.   Therapy Documentation Precautions:  Precautions Precautions: Fall, Other (comment) Precaution Comments: Left visual deficits Restrictions Weight Bearing Restrictions: No   See Function Navigator for Current Functional Status.  Therapy/Group: Individual Therapy  Netta Corrigan, PT, DPT 08/04/2017, 7:46 AM

## 2017-08-04 NOTE — Progress Notes (Signed)
Occupational Therapy Weekly Progress Note  Patient Details  Name: Eric Long MRN: [id number] Date of Birth: June 11, 1940  Beginning of progress report period: July 27, 2017 End of progress report period: August 04, 2017  Today's Date: 08/04/2017 OT Individual Time: 0800-0900 OT Individual Time Calculation (min): 60 min   Patient has met 4 of 4 short term goals.  Pt is making steady progress with OT treatments at this time.  LUE function and L attention continues to improve to a supervision level for bathing and dressing tasks.  He continues to need increased time for fastening belt and pants as well as for tying shoes.  Supervision for sit to to stand with min instructional cueing for hand placement.  Min assist for stand pivot transfers without assistive device.  Will continue with current OT treatment POC.    Patient continues to demonstrate the following deficits: muscle weakness, impaired timing and sequencing, abnormal tone, unbalanced muscle activation, ataxia, decreased coordination and decreased motor planning, decreased attention to left, decreased initiation, decreased attention, decreased awareness, decreased problem solving, decreased safety awareness, decreased memory and delayed processing and decreased sitting balance, decreased standing balance, decreased postural control, hemiplegia and decreased balance strategies and therefore will continue to benefit from skilled OT intervention to enhance overall performance with BADL.  Patient progressing toward long term goals..  Continue plan of care.  OT Short Term Goals Week 1:  OT Short Term Goal 1 (Week 1): Pt will stand at isnk for 2 grooming items to demo improved endurance OT Short Term Goal 1 - Progress (Week 1): Met OT Short Term Goal 2 (Week 1): Pt will complete toilet transfer wiht touching A OT Short Term Goal 2 - Progress (Week 1): Met OT Short Term Goal 3 (Week 1): Pt will thread BLE into pants wiht no more than 1 VC for  orientation to demonstrate improved L attention/spatial awareness OT Short Term Goal 3 - Progress (Week 1): Met OT Short Term Goal 4 (Week 1): Pt will locate 4/5 objects in far L visual field with no more than min question cues OT Short Term Goal 4 - Progress (Week 1): Met Week 2:  OT Short Term Goal 1 (Week 2): STG=LTG 2/2 ELOS  Skilled Therapeutic Interventions/Progress Updates:    OT treatment session focused on modified bathing/dressing, L attention, functional use of  LUE, and standing balance. Pt greeted semi-reclined in bed finishing breakfast and agreeable to OT. Pt reported need for bathroom prior to shower. Stand-pivot bec>wc>toilet>shower chair with min A and decreased cues needed for LLE placement. Pt with successful BM and worked on hip hike to complete peri-care. Pt then transferred into shower in similar fashion.Bathing completed with overall supervision and min guard A for balance when reaching to wash buttocks. Standing balance and L fine motor control with standing grooming tasks at the sink for  ~ 2 mins. Mod questioning cues to orient clothing today, and educated on strategy to help ensure shirt is on the right way. Pt able to don without physical assist. L fine motor control with hair brushing task with improved control. Pt left seated in wc at end of session with needs met.  Therapy Documentation Precautions:  Precautions Precautions: Fall, Other (comment) Precaution Comments: Left visual deficits Restrictions Weight Bearing Restrictions: No Pain:  none/denies pain   See Function Navigator for Current Functional Status.  Therapy/Group: Individual Therapy  Valma Cava 08/04/2017, 8:34 AM

## 2017-08-04 NOTE — Progress Notes (Signed)
Speech Language Pathology Daily Session Note  Patient Details  Name: Eric Long MRN: [id number] Date of Birth: 05-24-1940  Today's Date: 08/04/2017 SLP Individual Time: 0915-1000 SLP Individual Time Calculation (min): 45 min  Short Term Goals: Week 2: SLP Short Term Goal 1 (Week 2): Pt will demonstrate selective attention in moderately distracting environment for ~ 45 minutes with Mod level cues.  SLP Short Term Goal 2 (Week 2): Pt will attend to left side of environment in moderately distracting task for ~ 30 minutes with Mod A cues.  SLP Short Term Goal 3 (Week 2): Pt will solve semi- complex problem solving tasks with Mod A cues.  SLP Short Term Goal 4 (Week 2): Pt will demonstrate intellectual awareness by listing 3 physical and 3 cognitive deficits related to acute CVA with Mod A cues.   Skilled Therapeutic Interventions: Skilled treatment session focused on cognition goals. SLP received pt in room, upright in wheelchair and homework from previous session completed but not accurate. SLP further facilitated session by providing Max A cues to problem solve simple money math problems, Max A to recall amount of change just stated, Max A to use compensatory memory strategies, Max A to count correct money after writing down amount needed. Pt with no insight into errors and no indication that task was difficult. Pt was returned to room, left upright in wheelchair with safety belt donned and all needs within reach. Continue per current plan of care.      Function:    Cognition Comprehension Comprehension assist level: Understands basic 90% of the time/cues < 10% of the time;Understands basic 75 - 89% of the time/ requires cueing 10 - 24% of the time  Expression   Expression assist level: Expresses basic needs/ideas: With extra time/assistive device;Expresses basic 90% of the time/requires cueing < 10% of the time.  Social Interaction Social Interaction assist level: Interacts appropriately  90% of the time - Needs monitoring or encouragement for participation or interaction.  Problem Solving Problem solving assist level: Solves basic 50 - 74% of the time/requires cueing 25 - 49% of the time  Memory Memory assist level: Recognizes or recalls 75 - 89% of the time/requires cueing 10 - 24% of the time    Pain    Therapy/Group: Individual Therapy  Endrit Gittins 08/04/2017, 9:59 AM

## 2017-08-04 NOTE — Progress Notes (Signed)
Subjective/Complaints:  No new issues overnight  ROS- neg CP, SOB, N/V/D  Objective: Vital Signs: Blood pressure 140/76, pulse 90, temperature 98.7 F (37.1 C), temperature source Oral, resp. rate 16, height 5' 10.5" (1.791 m), weight 103 kg (227 lb 1.2 oz), SpO2 100 %. No results found. Results for orders placed or performed during the hospital encounter of 07/26/17 (from the past 72 hour(s))  Glucose, capillary     Status: None   Collection Time: 08/01/17 11:27 AM  Result Value Ref Range   Glucose-Capillary 96 65 - 99 mg/dL  Glucose, capillary     Status: None   Collection Time: 08/01/17  4:35 PM  Result Value Ref Range   Glucose-Capillary 77 65 - 99 mg/dL  Glucose, capillary     Status: Abnormal   Collection Time: 08/01/17  9:35 PM  Result Value Ref Range   Glucose-Capillary 179 (H) 65 - 99 mg/dL  Glucose, capillary     Status: None   Collection Time: 08/02/17  6:22 AM  Result Value Ref Range   Glucose-Capillary 82 65 - 99 mg/dL  Glucose, capillary     Status: None   Collection Time: 08/02/17 12:03 PM  Result Value Ref Range   Glucose-Capillary 83 65 - 99 mg/dL  Glucose, capillary     Status: None   Collection Time: 08/02/17  4:45 PM  Result Value Ref Range   Glucose-Capillary 83 65 - 99 mg/dL  Glucose, capillary     Status: None   Collection Time: 08/02/17  9:24 PM  Result Value Ref Range   Glucose-Capillary 77 65 - 99 mg/dL  Glucose, capillary     Status: None   Collection Time: 08/02/17 10:42 PM  Result Value Ref Range   Glucose-Capillary 92 65 - 99 mg/dL  Glucose, capillary     Status: None   Collection Time: 08/03/17  6:43 AM  Result Value Ref Range   Glucose-Capillary 79 65 - 99 mg/dL  Glucose, capillary     Status: None   Collection Time: 08/03/17 11:35 AM  Result Value Ref Range   Glucose-Capillary 90 65 - 99 mg/dL  Glucose, capillary     Status: None   Collection Time: 08/03/17  4:53 PM  Result Value Ref Range   Glucose-Capillary 75 65 - 99 mg/dL   Glucose, capillary     Status: Abnormal   Collection Time: 08/03/17  8:58 PM  Result Value Ref Range   Glucose-Capillary 103 (H) 65 - 99 mg/dL  Glucose, capillary     Status: Abnormal   Collection Time: 08/04/17  6:22 AM  Result Value Ref Range   Glucose-Capillary 128 (H) 65 - 99 mg/dL     HEENT: normal Cardio: RRR and no murmur Resp: CTA B/L and unlabored GI: BS positive and NT, ND Extremity:  No Edema Skin:   Intact Neuro: Alert/Oriented, Abnormal Sensory Reduced sensation LT, Left foot, Abnormal Motor 4/5 Left delt, Bi, Tri, grip, HF, KE, ADF, Abnormal FMC Ataxic/ dec FMC and Inattention , Left side Musc/Skel:  Other no pain with UE or LE ROM Gen NAD Left homonymous hemianopsia  Assessment/Plan: 1. Functional deficits secondary to Left hemiparesis and Left homonymous hemianopsia due to R MCA infarct which require 3+ hours per day of interdisciplinary therapy in a comprehensive inpatient rehab setting. Physiatrist is providing close team supervision and 24 hour management of active medical problems listed below. Physiatrist and rehab team continue to assess barriers to discharge/monitor patient progress toward functional and medical goals. FIM: Function -  Bathing Position: Shower Body parts bathed by patient: Right arm, Left arm, Chest, Abdomen, Front perineal area, Right upper leg, Left upper leg, Right lower leg, Left lower leg, Buttocks Body parts bathed by helper: Back Assist Level: Touching or steadying assistance(Pt > 75%)  Function- Upper Body Dressing/Undressing What is the patient wearing?: Pull over shirt/dress Pull over shirt/dress - Perfomed by patient: Thread/unthread right sleeve, Put head through opening, Pull shirt over trunk, Thread/unthread left sleeve Pull over shirt/dress - Perfomed by helper: Thread/unthread left sleeve Assist Level: Supervision or verbal cues Function - Lower Body Dressing/Undressing What is the patient wearing?: Pants, Underwear,  Socks, Shoes Position: Wheelchair/chair at [employer] - Performed by patient: Thread/unthread right underwear leg, Thread/unthread left underwear leg, Pull underwear up/down Pants- Performed by patient: Thread/unthread right pants leg, Thread/unthread left pants leg, Pull pants up/down Pants- Performed by helper: Pull pants up/down Non-skid slipper socks- Performed by patient: Don/doff right sock, Don/doff left sock Socks - Performed by patient: Don/doff right sock, Don/doff left sock Socks - Performed by helper: Don/doff right sock, Don/doff left sock Shoes - Performed by patient: Don/doff right shoe, Don/doff left shoe Shoes - Performed by helper: Fasten right, Fasten left Assist for footwear: Supervision/touching assist Assist for lower body dressing: Touching or steadying assistance (Pt > 75%)  Function - Toileting Toileting steps completed by patient: Adjust clothing prior to toileting, Performs perineal hygiene, Adjust clothing after toileting Toileting steps completed by helper: Adjust clothing after toileting Assist level: Touching or steadying assistance (Pt.75%)  Function - Toilet Transfers Toilet transfer assistive device: Grab bar Assist level to toilet: Touching or steadying assistance (Pt > 75%) Assist level from toilet: Touching or steadying assistance (Pt > 75%)  Function - Chair/bed transfer Chair/bed transfer method: Stand pivot Chair/bed transfer assist level: Touching or steadying assistance (Pt > 75%) Chair/bed transfer assistive device: Armrests Chair/bed transfer details: Verbal cues for precautions/safety, Verbal cues for technique, Verbal cues for sequencing, Tactile cues for posture  Function - Locomotion: Wheelchair Will patient use wheelchair at discharge?: Yes Type: Manual Max wheelchair distance: 150 ft Assist Level: Supervision or verbal cues Assist Level: Supervision or verbal cues Assist Level: Supervision or verbal cues Turns around,maneuvers  to table,bed, and toilet,negotiates 3% grade,maneuvers on rugs and over doorsills: No Function - Locomotion: Ambulation Assistive device: Lite gait Max distance: 300 ft Assist level: 2 helpers Assist level: 2 helpers Assist level: 2 helpers Walk 150 feet activity did not occur: Safety/medical concerns Assist level: 2 helpers Walk 10 feet on uneven surfaces activity did not occur: Safety/medical concerns  Function - Comprehension Comprehension: Auditory Comprehension assist level: Follows basic conversation/direction with no assist  Function - Expression Expression: Verbal Expression assist level: Expresses basic needs/ideas: With no assist  Function - Social Interaction Social Interaction assist level: Interacts appropriately 90% of the time - Needs monitoring or encouragement for participation or interaction.  Function - Problem Solving Problem solving assist level: Solves basic 50 - 74% of the time/requires cueing 25 - 49% of the time  Function - Memory Memory assist level: Recognizes or recalls 75 - 89% of the time/requires cueing 10 - 24% of the time Patient normally able to recall (first 3 days only): Current season, Staff names and faces, That he or she is in a hospital, Location of own room  Medical Problem List and Plan: 1.   Left hemiparesis, Left homonymous hemianopsia secondary to right posterior MCA infarct.CIR CIR PT, OT,SLP,2. DVT Prophylaxis/Anticoagulation: Pharmaceutical:Lovenox 3. Pain Management:N/A 4. Mood:LCSW to follow for  evaluation and support. 5. Neuropsych: This patientiscapable of making decisions on hisown behalf. 6. Skin/Wound Care:routine pressure relief measures. 7. Fluids/Electrolytes/Nutrition:Monitor I/O. Offer supplements prn if intake poor. 8. HTN: Monitor BP bid. Continuetoprol XL Vitals:   08/03/17 2124 08/04/17 0615  BP: (!) 159/64 140/76  Pulse: 73 90  Resp:  16  Temp:  98.7 F (37.1 C)  SpO2: 100% 100%  controlled/1,  on amlodipine /benzapril at home, continue amlodipine 2.5mg  9.T2DM: Hgb A1C-7.6. Used Lantus with breakfast,[place] lunchand [place] with supper. Wife to bring in meds..Monitor BS ac/hs and continue Lantus with SSI. CBG (last 3)  Recent Labs    08/03/17 1653 08/03/17 2058 08/04/17 0622  GLUCAP 75 103* 128*  Controlled 4/1 10. CKDstage III:GFR stable  11. Normocytic anemia:Monitor --will add iron supplement. 12. Hyperlipidemia: On Crestor and zetia.    LOS (Days) 9 A FACE TO FACE EVALUATION WAS PERFORMED  Charlett Blake 08/04/2017, 8:28 AM

## 2017-08-05 ENCOUNTER — Inpatient Hospital Stay (HOSPITAL_COMMUNITY): Payer: Medicare HMO

## 2017-08-05 ENCOUNTER — Encounter (HOSPITAL_COMMUNITY): Payer: Medicare HMO | Admitting: Psychology

## 2017-08-05 ENCOUNTER — Inpatient Hospital Stay (HOSPITAL_COMMUNITY): Payer: Medicare HMO | Admitting: Speech Pathology

## 2017-08-05 ENCOUNTER — Inpatient Hospital Stay (HOSPITAL_COMMUNITY): Payer: Medicare HMO | Admitting: Physical Therapy

## 2017-08-05 ENCOUNTER — Other Ambulatory Visit: Payer: Self-pay

## 2017-08-05 ENCOUNTER — Inpatient Hospital Stay (HOSPITAL_COMMUNITY): Payer: Medicare HMO | Admitting: Occupational Therapy

## 2017-08-05 LAB — GLUCOSE, CAPILLARY
GLUCOSE-CAPILLARY: 90 mg/dL (ref 65–99)
GLUCOSE-CAPILLARY: 80 mg/dL (ref 65–99)
GLUCOSE-CAPILLARY: 75 mg/dL (ref 65–99)
Glucose-Capillary: 82 mg/dL (ref 65–99)

## 2017-08-05 MED ORDER — BENAZEPRIL HCL 5 MG PO TABS
5.0000 mg | ORAL_TABLET | Freq: Every day | ORAL | Status: DC
  Administered 2017-08-06 – 2017-08-09 (×4): 5 mg via ORAL
  Filled 2017-08-05 (×4): qty 1

## 2017-08-05 MED ORDER — METOPROLOL SUCCINATE ER 50 MG PO TB24
50.0000 mg | ORAL_TABLET | Freq: Every day | ORAL | Status: DC
  Administered 2017-08-06 – 2017-08-09 (×4): 50 mg via ORAL
  Filled 2017-08-05 (×4): qty 1

## 2017-08-05 NOTE — Progress Notes (Signed)
Occupational Therapy Session Note  Patient Details  Name: Eric Long MRN: [id number] Date of Birth: Feb 15, 1941  Today's Date: 08/05/2017 OT Individual Time: 0900-1000 OT Individual Time Calculation (min): 60 min    Short Term Goals: Week 2:  OT Short Term Goal 1 (Week 2): STG=LTG 2/2 ELOS  Skilled Therapeutic Interventions/Progress Updates:    Pt greeted sitting in wc and agreeable to OT.  Pt requests to shower this am. Worked on functional ambulation in room to ambulate into shower. Min A overall with cues for safe RW position when turning to sit onto tub bench.Bathing completed with set-up A and overall supervision-intermittent min guard A for dynamic balance when reaching to wash buttocks. L fine motor coordination with opening zip lock bags, zippers, and containers. Increased time to screw on lid to small tube of hair product. Pt ambulated in room with RW to dresser drawers with min guard A and was educated on RW placement to access drawers and collect clothing- Max multimodal cues for safe RW and body positioning. Pt returned to standing at the sink with verbal cues to line up midline of sink and RW. Pt donned pants backwards and needed cues to take pants off and find the middle before trying again. Poor carryover from yesterday for technique to orient shirt requiring max multimodal cues to locate the back and front of shirt. Fine motor coordination with shoe tying task and overall min A. Cues to apply appropriate amount of pressure to pull shoe strings, but not pull them out. Pt left seated in wc with safety belt on, chair alarm, and needs met.  Therapy Documentation Precautions:  Precautions Precautions: Fall, Other (comment) Precaution Comments: Left visual deficits Restrictions Weight Bearing Restrictions: No Pain: Pain Assessment Pain Scale: 0-10 Pain Score: 0-No pain  See Function Navigator for Current Functional Status.   Therapy/Group: Individual Therapy  Valma Cava 08/05/2017, 9:29 AM

## 2017-08-05 NOTE — Progress Notes (Signed)
Physical Therapy Session Note  Patient Details  Name: Eric Long MRN: [id number] Date of Birth: Jan 30, 1941  Today's Date: 08/05/2017 PT Individual Time: 1100-1159 PT Individual Time Calculation (min): 59 min   Short Term Goals: Week 2:  PT Short Term Goal 1 (Week 2): STG=LTG due to ELOS  Skilled Therapeutic Interventions/Progress Updates:    Pt seated in w/c upon PT arrival, agreeable to therapy tx and denies pain. Pt transported from room>gym. Pt ambulated with RW from gym>throughout unit>rehab apartment x200 ft with min assist, verbal cues for foot placement and RW management. Pt ambulated throughout rehab unit working on navigating around furniture with RW and sitting on lower surfaces, min assist. Pt worked on gait training with quad cane x 200 ft with 2 turns and without AD x 100 ft with 2 turns, both requiring min assist overall with occasional mod assist secondary to LOB, verbal cues for sequencing of quad cane and for foot placement. Pt worked on dynamic standing balance and coordination to perform toe taps to targets on 4 inch step, no UE support, min to mod assit, x 2 trials. Pt worked on dynamic standing balance and coordination to perform toe taps on colored cones, no UE support. Pt transported back to room and left seated in w/c with QRB in place and needs in reach, RN present.   Therapy Documentation Precautions:  Precautions Precautions: Fall, Other (comment) Precaution Comments: Left visual deficits Restrictions Weight Bearing Restrictions: No   See Function Navigator for Current Functional Status.   Therapy/Group: Individual Therapy  Netta Corrigan, PT, DPT 08/05/2017, 11:24 AM

## 2017-08-05 NOTE — Progress Notes (Signed)
Speech Language Pathology Daily Session Note  Patient Details  Name: Eric Long MRN: [id number] Date of Birth: [birth year]  Today's Date: 08/05/2017 SLP Individual Time: 0805-0830 SLP Individual Time Calculation (min): 25 min  Short Term Goals: Week 2: SLP Short Term Goal 1 (Week 2): Pt will demonstrate selective attention in moderately distracting environment for ~ 45 minutes with Mod level cues.  SLP Short Term Goal 2 (Week 2): Pt will attend to left side of environment in moderately distracting task for ~ 30 minutes with Mod A cues.  SLP Short Term Goal 3 (Week 2): Pt will solve semi- complex problem solving tasks with Mod A cues.  SLP Short Term Goal 4 (Week 2): Pt will demonstrate intellectual awareness by listing 3 physical and 3 cognitive deficits related to acute CVA with Mod A cues.   Skilled Therapeutic Interventions:  Pt was seen for skilled ST targeting cognitive goals.  Pt needed min question cues to recall activities from previous therapy sessions, including distraction management techniques covered on Saturday.  Pt's wife was present and updated regarding current goals and progress.  SLP also provided skilled education regarding cognitive deficits s/p R brain dysfunction, including that pt's verbal abilities will often exceed his functional abilities.  SLP facilitated the session with a novel card game to address selective attention and problem solving.  Pt selectively attended to task in a mildly distracting environment for ~15 minutes with no cues needed for redirection.  Min assist verbal cues were needed for problem solving due to decreased task organization.  Pt was left at nursing station in wheelchair with quick release belt donned.  Continue per current plan of care.    Function:  Eating Eating                 Cognition Comprehension Comprehension assist level: Follows basic conversation/direction with no assist  Expression   Expression assist level: Expresses  basic needs/ideas: With no assist  Social Interaction Social Interaction assist level: Interacts appropriately 90% of the time - Needs monitoring or encouragement for participation or interaction.  Problem Solving Problem solving assist level: Solves basic 75 - 89% of the time/requires cueing 10 - 24% of the time  Memory Memory assist level: Recognizes or recalls 75 - 89% of the time/requires cueing 10 - 24% of the time    Pain Pain Assessment Pain Scale: 0-10 Pain Score: 0-No pain  Therapy/Group: Individual Therapy  Jeanne Diefendorf, Selinda Orion 08/05/2017, 8:43 AM

## 2017-08-05 NOTE — Progress Notes (Signed)
Physical Therapy Session Note  Patient Details  Name: Eric Long MRN: [id number] Date of Birth: 04-21-1941  Today's Date: 08/05/2017 PT Individual Time: 1510-1555 PT Individual Time Calculation (min): 45 min   Short Term Goals: Week 2:  PT Short Term Goal 1 (Week 2): STG=LTG due to ELOS  Skilled Therapeutic Interventions/Progress Updates:   Pt in w/c and agreeable to therapy, no c/o pain. Total assist w/c transport to/from gym for time management. Session focused on transfer training w/ quad cane w/ emphasis on safety, L: body awareness, and overall sequencing and stand pivot transfer. Performed blocked practice of stand pivot transfers using R quad cane going to both R and L sides repeatedly. Min guard to close supervision for safety, no LOB occurred. Increased difficulty sequencing stand pivot towards L, requiring occasional verbal, visual, and tactile cues for step pivot technique. Occasional verbal cues for increased LLE awareness when turning, had a few bouts of overstepping foot, creating increased difficulty w/ transitioning stand>sit. Practiced stand pivots in ADL apartment, turning to/from recliner and to/from EOB. Pt w/ increased L body awareness and L attention this session compared to previous sessions with this therapist. Returned to room in w/c, ended session in w/c, call bell within reach and all needs met. Chair alarm and quick release belt engaged.   Therapy Documentation Precautions:  Precautions Precautions: Fall, Other (comment) Precaution Comments: Left visual deficits Restrictions Weight Bearing Restrictions: No Vital Signs: Therapy Vitals Temp: 98.4 F (36.9 C) Temp Source: Oral Pulse Rate: 83 BP: (!) 127/109 Patient Position (if appropriate): Sitting Oxygen Therapy SpO2: 97 % O2 Device: Room Air  See Function Navigator for Current Functional Status.   Therapy/Group: Individual Therapy  Raeley Gilmore K Arnette 08/05/2017, 3:59 PM

## 2017-08-05 NOTE — Progress Notes (Signed)
Subjective/Complaints:  Slept well last noc  ROS- neg CP, SOB, N/V/D  Objective: Vital Signs: Blood pressure (!) 155/74, pulse 81, temperature 97.9 F (36.6 C), temperature source Oral, resp. rate 18, height 5' 10.5" (1.791 m), weight 103 kg (227 lb 1.2 oz), SpO2 98 %. No results found. Results for orders placed or performed during the hospital encounter of 07/26/17 (from the past 72 hour(s))  Glucose, capillary     Status: None   Collection Time: 08/02/17 12:03 PM  Result Value Ref Range   Glucose-Capillary 83 65 - 99 mg/dL  Glucose, capillary     Status: None   Collection Time: 08/02/17  4:45 PM  Result Value Ref Range   Glucose-Capillary 83 65 - 99 mg/dL  Glucose, capillary     Status: None   Collection Time: 08/02/17  9:24 PM  Result Value Ref Range   Glucose-Capillary 77 65 - 99 mg/dL  Glucose, capillary     Status: None   Collection Time: 08/02/17 10:42 PM  Result Value Ref Range   Glucose-Capillary 92 65 - 99 mg/dL  Glucose, capillary     Status: None   Collection Time: 08/03/17  6:43 AM  Result Value Ref Range   Glucose-Capillary 79 65 - 99 mg/dL  Glucose, capillary     Status: None   Collection Time: 08/03/17 11:35 AM  Result Value Ref Range   Glucose-Capillary 90 65 - 99 mg/dL  Glucose, capillary     Status: None   Collection Time: 08/03/17  4:53 PM  Result Value Ref Range   Glucose-Capillary 75 65 - 99 mg/dL  Glucose, capillary     Status: Abnormal   Collection Time: 08/03/17  8:58 PM  Result Value Ref Range   Glucose-Capillary 103 (H) 65 - 99 mg/dL  Glucose, capillary     Status: Abnormal   Collection Time: 08/04/17  6:22 AM  Result Value Ref Range   Glucose-Capillary 128 (H) 65 - 99 mg/dL  Glucose, capillary     Status: None   Collection Time: 08/04/17 11:55 AM  Result Value Ref Range   Glucose-Capillary 87 65 - 99 mg/dL  Glucose, capillary     Status: None   Collection Time: 08/04/17  4:32 PM  Result Value Ref Range   Glucose-Capillary 85 65 - 99  mg/dL  Glucose, capillary     Status: None   Collection Time: 08/04/17  8:34 PM  Result Value Ref Range   Glucose-Capillary 81 65 - 99 mg/dL  Glucose, capillary     Status: None   Collection Time: 08/05/17  6:42 AM  Result Value Ref Range   Glucose-Capillary 90 65 - 99 mg/dL     HEENT: normal Cardio: RRR and no murmur Resp: CTA B/L and unlabored GI: BS positive and NT, ND Extremity:  No Edema Skin:   Intact Neuro: Alert/Oriented, Abnormal Sensory Reduced sensation LT, Left foot, Abnormal Motor 4/5 Left delt, Bi, Tri, grip, HF, KE, ADF, Abnormal FMC Ataxic/ dec FMC and Inattention , Left side Musc/Skel:  Other no pain with UE or LE ROM Gen NAD Left homonymous hemianopsia  Assessment/Plan: 1. Functional deficits secondary to Left hemiparesis and Left homonymous hemianopsia due to R MCA infarct which require 3+ hours per day of interdisciplinary therapy in a comprehensive inpatient rehab setting. Physiatrist is providing close team supervision and 24 hour management of active medical problems listed below. Physiatrist and rehab team continue to assess barriers to discharge/monitor patient progress toward functional and medical goals. FIM: Function -  Bathing Position: Shower Body parts bathed by patient: Right arm, Left arm, Chest, Abdomen, Front perineal area, Right upper leg, Left upper leg, Right lower leg, Left lower leg, Buttocks Body parts bathed by helper: Back Assist Level: Touching or steadying assistance(Pt > 75%)  Function- Upper Body Dressing/Undressing What is the patient wearing?: Pull over shirt/dress Pull over shirt/dress - Perfomed by patient: Thread/unthread right sleeve, Put head through opening, Pull shirt over trunk, Thread/unthread left sleeve Pull over shirt/dress - Perfomed by helper: Thread/unthread left sleeve Assist Level: Supervision or verbal cues Function - Lower Body Dressing/Undressing What is the patient wearing?: Pants, Underwear, Socks,  Shoes Position: Wheelchair/chair at [employer] - Performed by patient: Thread/unthread right underwear leg, Thread/unthread left underwear leg, Pull underwear up/down Pants- Performed by patient: Thread/unthread right pants leg, Thread/unthread left pants leg, Pull pants up/down Pants- Performed by helper: Pull pants up/down Non-skid slipper socks- Performed by patient: Don/doff right sock, Don/doff left sock Socks - Performed by patient: Don/doff right sock, Don/doff left sock Socks - Performed by helper: Don/doff right sock, Don/doff left sock Shoes - Performed by patient: Don/doff right shoe, Don/doff left shoe Shoes - Performed by helper: Fasten right, Fasten left Assist for footwear: Supervision/touching assist Assist for lower body dressing: Touching or steadying assistance (Pt > 75%)  Function - Toileting Toileting steps completed by patient: Adjust clothing prior to toileting, Performs perineal hygiene, Adjust clothing after toileting Toileting steps completed by helper: Adjust clothing after toileting Assist level: Touching or steadying assistance (Pt.75%)  Function - Toilet Transfers Toilet transfer assistive device: Grab bar Assist level to toilet: Moderate assist (Pt 50 - 74%/lift or lower) Assist level from toilet: Moderate assist (Pt 50 - 74%/lift or lower)  Function - Chair/bed transfer Chair/bed transfer method: Stand pivot Chair/bed transfer assist level: Touching or steadying assistance (Pt > 75%) Chair/bed transfer assistive device: Armrests Chair/bed transfer details: Verbal cues for precautions/safety, Verbal cues for technique, Verbal cues for sequencing, Tactile cues for posture  Function - Locomotion: Wheelchair Will patient use wheelchair at discharge?: Yes Type: Manual Max wheelchair distance: 150 ft Assist Level: Supervision or verbal cues Assist Level: Supervision or verbal cues Assist Level: Supervision or verbal cues Turns around,maneuvers to  table,bed, and toilet,negotiates 3% grade,maneuvers on rugs and over doorsills: No Function - Locomotion: Ambulation Assistive device: Walker-rolling Max distance: 150 ft Assist level: Touching or steadying assistance (Pt > 75%) Assist level: Touching or steadying assistance (Pt > 75%) Assist level: Touching or steadying assistance (Pt > 75%) Walk 150 feet activity did not occur: Safety/medical concerns Assist level: 2 helpers Walk 10 feet on uneven surfaces activity did not occur: Safety/medical concerns  Function - Comprehension Comprehension: Auditory Comprehension assist level: Understands basic 90% of the time/cues < 10% of the time, Understands basic 75 - 89% of the time/ requires cueing 10 - 24% of the time  Function - Expression Expression: Verbal Expression assist level: Expresses basic needs/ideas: With extra time/assistive device, Expresses basic 90% of the time/requires cueing < 10% of the time.  Function - Social Interaction Social Interaction assist level: Interacts appropriately 90% of the time - Needs monitoring or encouragement for participation or interaction.  Function - Problem Solving Problem solving assist level: Solves basic 50 - 74% of the time/requires cueing 25 - 49% of the time  Function - Memory Memory assist level: Recognizes or recalls 75 - 89% of the time/requires cueing 10 - 24% of the time Patient normally able to recall (first 3 days only): Current season,  Staff names and faces, That he or she is in a hospital, Location of own room  Medical Problem List and Plan: 1.   Left hemiparesis, Left homonymous hemianopsia secondary to right posterior MCA infarct.CIR CIR PT, OT,SLP Team conference today please see physician documentation under team conference tab, met with team face-to-face to discuss problems,progress, and goals. Formulized individual treatment plan based on medical history, underlying problem and comorbidities.,2. DVT  Prophylaxis/Anticoagulation: Pharmaceutical:Lovenox 3. Pain Management:N/A 4. Mood:LCSW to follow for evaluation and support. 5. Neuropsych: This patientiscapable of making decisions on hisown behalf. 6. Skin/Wound Care:routine pressure relief measures. 7. Fluids/Electrolytes/Nutrition:Monitor I/O. Offer supplements prn if intake poor. 8. HTN: Monitor BP bid. Continuetoprol XL Vitals:   08/04/17 1341 08/04/17 2200  BP: (!) 145/71 (!) 155/74  Pulse: 87 81  Resp: 18   Temp: 97.9 F (36.6 C)   SpO2: 97% 98%  elevated , on amlodipine /benzapril at home, continue amlodipine 2.13m increase toprol to 575mfirst dose 4/3 9.T2DM: Hgb A1C-7.6. Used Lantus with breakfast,[place]ith lunchand [place] supper. Wife to bring in meds..Monitor BS ac/hs and continue Lantus with SSI. CBG (last 3)  Recent Labs    08/04/17 1632 08/04/17 2034 08/05/17 0642  GLUCAP 85 81 90  Controlled 4/3 10. CKDstage III:GFR stable  11. Normocytic anemia:Monitor --will add iron supplement. 12. Hyperlipidemia: On Crestor and zetia.    LOS (Days) 10 A FACE TO FACE EVALUATION WAS PERFORMED  AnCharlett Blake/06/2017, 8:35 AM

## 2017-08-05 NOTE — Consult Note (Signed)
Neuropsychological Consultation   Patient:   JIRAIYA MCEWAN   DOB:   25-Aug-1940  MR Number:  [id number]  Location:  [facility] [id number] [place] [address] Dept: [place]: [phone]           Date of Service:   08/05/2017  Start Time:   2 PM End Time:   3 PM  Provider/Observer:  Ilean Skill, Psy.D.       Clinical Neuropsychologist       Billing Code/Service: [id number] 4 Units  Chief Complaint:    ERIQ HUFFORD is a 77 year old male with history of diabetes and hyperlipidemia.  Admitted on 07/22/2017 with changes in gait and left sided weakness over 2 days.  CT revealed finding consistent with acute to subacute infarct.  Right MCA territory infarct due to intracranial and extracranial large vessel vessel atherosclerosis--dual antiplatelet recommended and patient to participate in Stroke Afib trial.Left sided weakness continues.  Reason for Service:  Mr. Valliant referred for neuropsychological consultion due to adjustment and coping issues following Right MCA infarct and old basal ganglia cerebellar lacunar infarcts.    CWC:BJSEGB E Newtonis a 77 y.o.malewith history of type 2 diabetes mellitus and hyperlipidemia who was admitted on 07/22/2017 with unsteady gait and left-sided weakness times 2 days.CT of headrevealed hypodensity parietal and temporal lobes consistent with acute to subacute infarct.CTA head neck showed 40-55% right ICA stenosis 80% left ICA stenosis severe stenosis origin left vertebral artery and moderate stenosis right vertebral artery. MRI of brain done showing large area of acute ischemia within the posterior right hemisphere involving right MCA and small portion right PCA question embolic petechial hemorrhage posterior right MCA distribution and old basal ganglia cerebellar lacunar infarcts.2D echo done showing EF 60-65% no wall abnormalities and  grade 1 diastolic dysfunction.EEG was normal study no epileptiform discharges noted.  Dr. Leonie Man feltright MCA territory infarct due to intracranial and extracranial large vessel vessel atherosclerosis--dual antiplatelet recommended and patient to participate in Stroke Afib trial.Carotid dopplers done reveling 40-50% R-ICA stenosis and Left 80-99% ICA stenosis.Encephalopathy has resolved and with recommendations to follow up with vascular after discharge. Patientwith resultant left visual field deficits,poor safety awareness,balance deficits and cognitive deficits.CIR recommended due to functional deficits  Current Status:  Patient reports that he is doing better as he sees improvement in his symptoms.  Mental Status is good and mood has improved.  Worked on coping skills and adaption issues.  Behavioral Observation: ERAN MISTRY  presents as a 77 y.o.-year-old Right African American Male who appeared his stated age. his dress was Appropriate and he was Well Groomed and his manners were Appropriate to the situation.  his participation was indicative of Appropriate and Attentive behaviors.  There were any physical disabilities noted.  he displayed an appropriate level of cooperation and motivation.     Interactions:    Active Appropriate and Attentive  Attention:   within normal limits and attention span and concentration were age appropriate  Memory:   within normal limits; recent and remote memory intact  Visuo-spatial:  not examined  Speech (Volume):  normal  Speech:   normal; slurred mild due to right hemisphere infarct and not language centers.  Thought Process:  Coherent  Though Content:  WNL; not suicidal and not homicidal  Orientation:   person, place, time/date and situation  Judgment:   Good  Planning:   Good  Affect:    Appropriate  Mood:    Good mood state  Insight:   Good  Intelligence:   very high  Medical History:   Past Medical History:   Diagnosis Date  . Diabetes mellitus without complication ([place])   . Hyperlipidemia    Psychiatric History:  No prior psychiatric history  Family Med/Psych History:  Family History  Problem Relation Age of Onset  . Diabetes Sister       Impression/DX:  ILIAS STCHARLES is a 77 year old male with history of diabetes and hyperlipidemia.  Admitted on 07/22/2017 with changes in gait and left sided weakness over 2 days.  CT revealed finding consistent with acute to subacute infarct.  Right MCA territory infarct due to intracranial and extracranial large vessel vessel atherosclerosis--dual antiplatelet recommended and patient to participate in Stroke Afib trial.Left sided weakness continues.  Patient reports that he is doing better as he sees improvement in his symptoms.  Mental Status is good and mood has improved.  Worked on coping skills and adaption issues.   Diagnosis:    Carotid artery stenosis, unilateral, left - Plan: Ambulatory referral to Vascular Surgery  Acute CVA (cerebrovascular accident) ([place]) - Plan: clopidogrel (PLAVIX) tablet 75 mg  Acute ischemic right MCA stroke ([place]) - Plan: Ambulatory referral to Physical Medicine Rehab, Ambulatory referral to Vascular Surgery         Electronically Signed   _______________________ Ilean Skill, Psy.D.

## 2017-08-05 NOTE — Progress Notes (Addendum)
Subjective/Complaints:  Slept well. Eating better.   ROS- neg CP, SOB, N/V/D  Objective: Vital Signs: Blood pressure (!) 127/109, pulse 83, temperature 98.4 F (36.9 C), temperature source Oral, resp. rate 18, height 5' 10.5" (1.791 m), weight 103 kg (227 lb 1.2 oz), SpO2 97 %. No results found. Results for orders placed or performed during the hospital encounter of 07/26/17 (from the past 72 hour(s))  Glucose, capillary     Status: None   Collection Time: 08/02/17  9:24 PM  Result Value Ref Range   Glucose-Capillary 77 65 - 99 mg/dL  Glucose, capillary     Status: None   Collection Time: 08/02/17 10:42 PM  Result Value Ref Range   Glucose-Capillary 92 65 - 99 mg/dL  Glucose, capillary     Status: None   Collection Time: 08/03/17  6:43 AM  Result Value Ref Range   Glucose-Capillary 79 65 - 99 mg/dL  Glucose, capillary     Status: None   Collection Time: 08/03/17 11:35 AM  Result Value Ref Range   Glucose-Capillary 90 65 - 99 mg/dL  Glucose, capillary     Status: None   Collection Time: 08/03/17  4:53 PM  Result Value Ref Range   Glucose-Capillary 75 65 - 99 mg/dL  Glucose, capillary     Status: Abnormal   Collection Time: 08/03/17  8:58 PM  Result Value Ref Range   Glucose-Capillary 103 (H) 65 - 99 mg/dL  Glucose, capillary     Status: Abnormal   Collection Time: 08/04/17  6:22 AM  Result Value Ref Range   Glucose-Capillary 128 (H) 65 - 99 mg/dL  Glucose, capillary     Status: None   Collection Time: 08/04/17 11:55 AM  Result Value Ref Range   Glucose-Capillary 87 65 - 99 mg/dL  Glucose, capillary     Status: None   Collection Time: 08/04/17  4:32 PM  Result Value Ref Range   Glucose-Capillary 85 65 - 99 mg/dL  Glucose, capillary     Status: None   Collection Time: 08/04/17  8:34 PM  Result Value Ref Range   Glucose-Capillary 81 65 - 99 mg/dL  Glucose, capillary     Status: None   Collection Time: 08/05/17  6:42 AM  Result Value Ref Range   Glucose-Capillary  90 65 - 99 mg/dL  Glucose, capillary     Status: None   Collection Time: 08/05/17 11:59 AM  Result Value Ref Range   Glucose-Capillary 80 65 - 99 mg/dL  Glucose, capillary     Status: None   Collection Time: 08/05/17  4:52 PM  Result Value Ref Range   Glucose-Capillary 75 65 - 99 mg/dL     HEENT: AT, [place], EOMI, teeth in good repair.  Cardio: RRR and no murmur Resp: Clear B/L. No wheezes or rales.  GI: soft, NT, + BS.  Extremity:  No Edema Skin:   Intact Neuro: Alert/Oriented, Abnormal Sensory Reduced sensation LT, Left foot, Abnormal Motor 4/5 Left delt, Bi, Tri, grip, HF, KE, ADF, Abnormal FMC Ataxic/ dec FMC and Inattention , Left side Musc/Skel:  Other no pain with UE or LE ROM Gen NAD Left homonymous hemianopsia  Assessment/Plan: 1. Functional deficits secondary to Left hemiparesis and Left homonymous hemianopsia due to R MCA infarct which require 3+ hours per day of interdisciplinary therapy in a comprehensive inpatient rehab setting. Physiatrist is providing close team supervision and 24 hour management of active medical problems listed below. Physiatrist and rehab team continue to assess barriers  to discharge/monitor patient progress toward functional and medical goals. FIM: Function - Bathing Position: Shower Body parts bathed by patient: Right arm, Left arm, Chest, Abdomen, Front perineal area, Right upper leg, Left upper leg, Right lower leg, Left lower leg, Buttocks Body parts bathed by helper: Back Assist Level: Touching or steadying assistance(Pt > 75%)  Function- Upper Body Dressing/Undressing What is the patient wearing?: Pull over shirt/dress Pull over shirt/dress - Perfomed by patient: Thread/unthread right sleeve, Put head through opening, Pull shirt over trunk, Thread/unthread left sleeve Pull over shirt/dress - Perfomed by helper: Thread/unthread left sleeve Assist Level: Supervision or verbal cues Function - Lower Body Dressing/Undressing What is the patient  wearing?: Pants, Underwear, Socks, Shoes Position: Wheelchair/chair at [employer] - Performed by patient: Thread/unthread right underwear leg, Thread/unthread left underwear leg, Pull underwear up/down Pants- Performed by patient: Thread/unthread right pants leg, Thread/unthread left pants leg, Pull pants up/down Pants- Performed by helper: Pull pants up/down Non-skid slipper socks- Performed by patient: Don/doff right sock, Don/doff left sock Socks - Performed by patient: Don/doff right sock, Don/doff left sock Socks - Performed by helper: Don/doff right sock, Don/doff left sock Shoes - Performed by patient: Don/doff right shoe, Don/doff left shoe Shoes - Performed by helper: Fasten right, Fasten left Assist for footwear: Supervision/touching assist Assist for lower body dressing: Touching or steadying assistance (Pt > 75%)  Function - Toileting Toileting steps completed by patient: Adjust clothing prior to toileting, Performs perineal hygiene, Adjust clothing after toileting Toileting steps completed by helper: Adjust clothing after toileting Assist level: Touching or steadying assistance (Pt.75%)  Function - Toilet Transfers Toilet transfer assistive device: Grab bar Assist level to toilet: Moderate assist (Pt 50 - 74%/lift or lower) Assist level from toilet: Moderate assist (Pt 50 - 74%/lift or lower)  Function - Chair/bed transfer Chair/bed transfer method: Stand pivot, Ambulatory Chair/bed transfer assist level: Touching or steadying assistance (Pt > 75%) Chair/bed transfer assistive device: Armrests, Walker Chair/bed transfer details: Verbal cues for precautions/safety, Verbal cues for technique, Verbal cues for sequencing, Tactile cues for posture  Function - Locomotion: Wheelchair Will patient use wheelchair at discharge?: Yes Type: Manual Max wheelchair distance: 150 ft Assist Level: Supervision or verbal cues Assist Level: Supervision or verbal cues Assist Level:  Supervision or verbal cues Turns around,maneuvers to table,bed, and toilet,negotiates 3% grade,maneuvers on rugs and over doorsills: No Function - Locomotion: Ambulation Assistive device: Walker-rolling Max distance: 200 ft Assist level: Touching or steadying assistance (Pt > 75%) Assist level: Touching or steadying assistance (Pt > 75%) Assist level: Touching or steadying assistance (Pt > 75%) Walk 150 feet activity did not occur: Safety/medical concerns Assist level: Touching or steadying assistance (Pt > 75%) Walk 10 feet on uneven surfaces activity did not occur: Safety/medical concerns  Function - Comprehension Comprehension: Auditory Comprehension assist level: Follows basic conversation/direction with no assist  Function - Expression Expression: Verbal Expression assist level: Expresses basic needs/ideas: With no assist  Function - Social Interaction Social Interaction assist level: Interacts appropriately 90% of the time - Needs monitoring or encouragement for participation or interaction.  Function - Problem Solving Problem solving assist level: Solves basic 75 - 89% of the time/requires cueing 10 - 24% of the time  Function - Memory Memory assist level: Recognizes or recalls 75 - 89% of the time/requires cueing 10 - 24% of the time Patient normally able to recall (first 3 days only): Current season, Staff names and faces, That he or she is in a hospital, Location of own  room  Medical Problem List and Plan: 1.   Left hemiparesis, Left homonymous hemianopsia secondary to right posterior MCA infarct.CIR CIR PT, OT,SLP,2. DVT Prophylaxis/Anticoagulation: Pharmaceutical:Lovenox 3. Pain Management:N/A 4. Mood:LCSW to follow for evaluation and support. 5. Neuropsych: This patientiscapable of making decisions on hisown behalf. 6. Skin/Wound Care:routine pressure relief measures. 7. Fluids/Electrolytes/Nutrition:Monitor I/O. Offer supplements prn if intake poor. 8.  HTN: Monitor BP bid. Continuetoprol XL. Amlodipine added 3/30. Resume low dose benazepril today to help with better control.  Vitals:   08/04/17 2200 08/05/17 1415  BP: (!) 155/74 (!) 127/109  Pulse: 81 83  Resp:    Temp:  98.4 F (36.9 C)  SpO2: 98% 97%   9.T2DM: Hgb A1C-7.6. Well controlled on Lantus and home dose Victoza with lunch.  Will continue to monitor  BS ac/hs with SSI. CBG (last 3)  Recent Labs    08/05/17 0642 08/05/17 1159 08/05/17 1652  GLUCAP 90 80 75  Controlled 4/1 10. CKDstage III:GFR stable  11. Normocytic anemia: Continue to monitor. On iron supplement. 12. Hyperlipidemia: On Crestor and zetia.    LOS (Days) 10 A FACE TO FACE EVALUATION WAS PERFORMED  Bary Leriche 08/05/2017, 7:00 PM

## 2017-08-06 ENCOUNTER — Inpatient Hospital Stay (HOSPITAL_COMMUNITY): Payer: Medicare HMO | Admitting: Speech Pathology

## 2017-08-06 ENCOUNTER — Inpatient Hospital Stay (HOSPITAL_COMMUNITY): Payer: Medicare HMO

## 2017-08-06 ENCOUNTER — Inpatient Hospital Stay (HOSPITAL_COMMUNITY): Payer: Medicare HMO | Admitting: Occupational Therapy

## 2017-08-06 LAB — GLUCOSE, CAPILLARY
GLUCOSE-CAPILLARY: 86 mg/dL (ref 65–99)
GLUCOSE-CAPILLARY: 84 mg/dL (ref 65–99)
GLUCOSE-CAPILLARY: 81 mg/dL (ref 65–99)
GLUCOSE-CAPILLARY: 71 mg/dL (ref 65–99)

## 2017-08-06 LAB — CBC
HCT: 35.5 % — ABNORMAL LOW (ref 39.0–52.0)
Hemoglobin: 11.2 g/dL — ABNORMAL LOW (ref 13.0–17.0)
MCH: 29.2 pg (ref 26.0–34.0)
MCHC: 31.5 g/dL (ref 30.0–36.0)
MCV: 92.7 fL (ref 78.0–100.0)
PLATELETS: 256 10*3/uL (ref 150–400)
RBC: 3.83 MIL/uL — AB (ref 4.22–5.81)
RDW: 13.8 % (ref 11.5–15.5)
WBC: 9.9 10*3/uL (ref 4.0–10.5)

## 2017-08-06 LAB — BASIC METABOLIC PANEL
ANION GAP: 10 (ref 5–15)
BUN: 14 mg/dL (ref 6–20)
CALCIUM: 9 mg/dL (ref 8.9–10.3)
CO2: 26 mmol/L (ref 22–32)
CREATININE: 1.31 mg/dL — AB (ref 0.61–1.24)
Chloride: 101 mmol/L (ref 101–111)
GFR calc non Af Amer: 50 mL/min — ABNORMAL LOW (ref >60)
GFR, EST AFRICAN AMERICAN: 58 mL/min — AB (ref >60)
Glucose, Bld: 93 mg/dL (ref 65–99)
Potassium: 3.9 mmol/L (ref 3.5–5.1)
SODIUM: 137 mmol/L (ref 135–145)

## 2017-08-06 NOTE — Patient Care Conference (Signed)
Inpatient RehabilitationTeam Conference and Plan of Care Update Date: 08/06/2017   Time: 11:10 AM    Patient Name: Eric Long      Medical Record Number: [id number]  Date of Birth: 08-Jan-1941 Sex: Male         Room/Bed: 4W21C/4W21C-01 Payor Info: Payor: [employer] MEDICARE / Plan: [employer] MEDICARE HMO/PPO / Product Type: *No Product type* /    Admitting Diagnosis: r CVA  Admit Date/Time:  07/26/2017 12:32 PM Admission Comments: No comment available   Primary Diagnosis:  <principal problem not specified> Principal Problem: <principal problem not specified>  Patient Active Problem List   Diagnosis Date Noted  . Acute ischemic right MCA stroke ([place]) 07/26/2017  . Hyperlipidemia 07/23/2017  . Diastolic dysfunction   . Acute blood loss anemia   . Essential hypertension 07/22/2017  . Type 2 diabetes mellitus with renal manifestations ([place]) 07/22/2017  . Acute CVA (cerebrovascular accident) ([place]) 07/21/2017  . Chronic kidney disease, stage III (moderate) ([place]) 02/19/2013  . Atherosclerotic peripheral vascular disease ([place]) 02/19/2013  . Pure hypercholesterolemia 02/19/2013  . Diabetes mellitus, stable ([place]) 02/05/2013    Expected Discharge Date: Expected Discharge Date: 08/09/17  Team Members Present: Physician leading conference: Dr. Alysia Penna Social Worker Present: Ovidio Kin, LCSW Nurse Present: Leonette Nutting, RN PT Present: Michaelene Song, PT OT Present: Cherylynn Ridges, OT SLP Present: Weston Anna, SLP PPS Coordinator present : Daiva Nakayama, RN, CRRN     Current Status/Progress Goal Weekly Team Focus  Medical   CBG control improved, blood pressure control overall improving, left lower extremity ataxia reduced sensation  Reduce fall risk, reduce risk of recurrent CVA  Continue rehabilitation program start discharge planning   Bowel/Bladder   Continent of B/B occassional incont. episode during night shift  Maintain continence  Timed toileting continue    Swallow/Nutrition/ Hydration             ADL's   Supervision/min A overall  Supervision  L fine motor control, L visual field cut, pt/family education, modified bathing/dressing   Mobility   min assist overall for mobility, gait >150' w/ RW or quad cane (trial-ing both), min cues for L body awareness, L attention, and sequencing of all functional tasks  supervision overall  gait and transfers w/ LRAD, family education and discharge planning   Communication             Safety/Cognition/ Behavioral Observations  Overall Mod A   Min A  complex problem solving, attention and awareness    Pain   denies pain   Pain < 3  Assess QS and prn    Skin   Skin intact  Prevent breakd/infection  Assess QShift and prn       *See Care Plan and progress notes for long and short-term goals.     Barriers to Discharge  Current Status/Progress Possible Resolutions Date Resolved   Physician    Medical stability;Home environment access/layout  Steps at home without rail  Progressing towards goals  Home environment modification      Nursing                  PT  Home environment access/layout  2 steps to enter home w/o rails, requires cues for sequencing of all functional tasks              OT                  SLP  SW                Discharge Planning/Teaching Needs:  Wife and son here daily to observe in therapies and see pt's progress. Aware will require 24 hr supervision at discharge      Team Discussion:  Progressing toward his goals of supervision level. Needs cueing for attention to the left and his field cut. Continent of B & B. Family training scheduled for tomorrow with wife. Attention is better and attends more to the left side.  Revisions to Treatment Plan:  DC 4/6    Continued Need for Acute Rehabilitation Level of Care: The patient requires daily medical management by a physician with specialized training in physical medicine and rehabilitation for the following  conditions: Daily direction of a multidisciplinary physical rehabilitation program to ensure safe treatment while eliciting the highest outcome that is of practical value to the patient.: Yes Daily medical management of patient stability for increased activity during participation in an intensive rehabilitation regime.: Yes Daily analysis of laboratory values and/or radiology reports with any subsequent need for medication adjustment of medical intervention for : Mood/behavior problems;Neurological problems  Elease Hashimoto 08/06/2017, 1:34 PM

## 2017-08-06 NOTE — Progress Notes (Signed)
Social Work Patient ID: Eric Long, male   DOB: Aug 25, 1940, 77 y.o.   MRN: [id number]  Met with pt and wife to discuss team conference progress toward his goals and discharge 4/6. Wife to come ion tomorrow at 2:00-4:00 for family training. Still awaiting equipment needs and will make referral for OP therapies. Pt is pleased with his progress here and hopeful it will continue. See tomorrow to address questions.

## 2017-08-06 NOTE — Plan of Care (Signed)
  Problem: RH SAFETY Goal: RH STG DEMO UNDERSTANDING HOME SAFETY PRECAUTIONS Outcome: Progressing  Continues bed alarm, proper foot wear, call light within reach

## 2017-08-06 NOTE — Progress Notes (Signed)
Occupational Therapy Session Note  Patient Details  Name: Eric Long MRN: [id number] Date of Birth: [birth year]  Today's Date: 08/06/2017 OT Individual Time: 0900-1000 OT Individual Time Calculation (min): 60 min    Short Term Goals: Week 2:  OT Short Term Goal 1 (Week 2): STG=LTG 2/2 ELOS  Skilled Therapeutic Interventions/Progress Updates:    OT treatment session focused on modified bathing/dressing, L visual scanning, L fine motor control, and functional ambulation. Pt greeted semi-reclined in bed and requesting to shower this am. Pt ambulated in room with quad cane with overall min guard A and min cues for LLE placement and to avoid objects on L side. Educated pt on positioning for access to [employer] to collect clothing. Had pt utilize L UE to open drawers and collect clothing. Bathing completed with overall set-up A/supervision for balance when reaching to wash buttocks. Pt ambulated out of bathroom in similar fashion with min cued to locate wc on L side once out of bathroom. Pt sat in wc for shaving, but utilized L UE to don shaving cream without cues. Provided less cues for dressing today to see if pt could problem solve errors. Pt unable to recognize shirt was on backwards and needed cues to start over. Pt left seated in wc at end of session with chair alarm and safety belt on.   Therapy Documentation Precautions:  Precautions Precautions: Fall, Other (comment) Precaution Comments: Left visual deficits Restrictions Weight Bearing Restrictions: No Pain:  none/denies pain See Function Navigator for Current Functional Status.   Therapy/Group: Individual Therapy  Valma Cava 08/06/2017, 9:21 AM

## 2017-08-06 NOTE — Progress Notes (Signed)
Speech Language Pathology Daily Session Note  Patient Details  Name: DACARI BECKSTRAND MRN: [id number] Date of Birth: [birth year]  Today's Date: 08/06/2017 SLP Individual Time: 7672-0947 SLP Individual Time Calculation (min): 26 min  Short Term Goals: Week 2: SLP Short Term Goal 1 (Week 2): Pt will demonstrate selective attention in moderately distracting environment for ~ 45 minutes with Mod level cues.  SLP Short Term Goal 2 (Week 2): Pt will attend to left side of environment in moderately distracting task for ~ 30 minutes with Mod A cues.  SLP Short Term Goal 3 (Week 2): Pt will solve semi- complex problem solving tasks with Mod A cues.  SLP Short Term Goal 4 (Week 2): Pt will demonstrate intellectual awareness by listing 3 physical and 3 cognitive deficits related to acute CVA with Mod A cues.   Skilled Therapeutic Interventions:   Pt was seen for skilled ST targeting cognitive goals  SLP facilitated the session with a semi-complex scheduling task to address selective attention and semi-complex problem solving.  Pt completed task with min-mod assist assist for task organization and planning.  He selectively attended to task for its duration (~20 minutes) in a moderately distracting environment with no cues needed for redirection.    Pt was handed off to wife while seated in wheelchair.  Wife to transport pt back to room.  Continue per current plan of care.  '   Function:  Eating Eating                 Cognition Comprehension Comprehension assist level: Follows basic conversation/direction with no assist  Expression   Expression assist level: Expresses basic needs/ideas: With extra time/assistive device  Social Interaction Social Interaction assist level: Interacts appropriately 90% of the time - Needs monitoring or encouragement for participation or interaction.  Problem Solving Problem solving assist level: Solves basic 75 - 89% of the time/requires cueing 10 - 24% of the time   Memory Memory assist level: Recognizes or recalls 75 - 89% of the time/requires cueing 10 - 24% of the time    Pain Pain Assessment Pain Scale: 0-10 Pain Score: 0-No pain  Therapy/Group: Individual Therapy  Ikea Demicco, Selinda Orion 08/06/2017, 3:24 PM

## 2017-08-06 NOTE — Progress Notes (Signed)
Physical Therapy Session Note  Patient Details  Name: Eric Long MRN: [id number] Date of Birth: [birth year]  Today's Date: 08/06/2017 PT Individual Time: 1301-1400, 1950-9326 PT Individual Time Calculation (min): 59 min and 43 min    Short Term Goals: Week 2:  PT Short Term Goal 1 (Week 2): STG=LTG due to ELOS  Skilled Therapeutic Interventions/Progress Updates:    Session 1: Pt seated in w/c upon PT arrival, agreeable to therapy tx and denies pain. Pt transported from room>gym. Pt ambulated x 200 ft with quad cane and min assist, verbal cues for obstacle avoidance, decreased L LE external rotation, and awareness. Discussed scanning entire environment secondary to field cut to minimize running into obstacles on the L. Pt ambulated x 200 ft with RW and min assist, verbal cues for RW management and L LE awareness. Pt used quad cane to ambulate while working on Furniture conservator/restorer, worked on Best boy through [employer] and navigating around Hydrographic surveyor in rehab apartment. Pt ambulated to ortho gym x 100 ft with quad cane and min assist, performed car transfer with min assist. Discussed with pt and wife about using w/c for community access secondary to increases in LOB noted with busy environment/distractions, pt and wife agreeable. Pt ambulated back to gym x 150 ft with quad cane and min assist. Pt worked on standing balance while standing on foam to toss bean bags, no UE support, pt with increased anterior lean, requiring min assist for balance. Pt transferred w/c<>mat without AD, stand pivot and min assist. Pt performed mat<>floor transfer with min assist and max verbal cues for sequencing/techniques. Pt left seated in w/c in room at end of session with QRB in place and wife present.  Session 2: Pt seated in w/c upon PT arrival, agreeable to therapy tx and denies pain. Pt transported to dayroom. Pt ambulated from w/c>nustep with min assist using quad cane x 20 ft. Pt used nustep x 5 minutes of workload 5  for global strengthening and reciprocal movement. Pt worked on dynamic standing balance and coordination to perform toe taps to target numbers on 4 inch step, min to mod assist, x 2 trials. Pt worked on standing balance and coordination to step laterally over object, x 2 trials with mirror for feedback. Pt transported back to room and left seated with needs in reach and wife present.   Therapy Documentation Precautions:  Precautions Precautions: Fall, Other (comment) Precaution Comments: Left visual deficits Restrictions Weight Bearing Restrictions: No   See Function Navigator for Current Functional Status.   Therapy/Group: Individual Therapy  Netta Corrigan, PT, DPT 08/06/2017, 7:53 AM

## 2017-08-07 ENCOUNTER — Ambulatory Visit (HOSPITAL_COMMUNITY): Payer: Medicare HMO | Admitting: Physical Therapy

## 2017-08-07 ENCOUNTER — Encounter (HOSPITAL_COMMUNITY): Payer: Medicare HMO | Admitting: Speech Pathology

## 2017-08-07 ENCOUNTER — Inpatient Hospital Stay (HOSPITAL_COMMUNITY): Payer: Medicare HMO | Admitting: Physical Therapy

## 2017-08-07 ENCOUNTER — Encounter (HOSPITAL_COMMUNITY): Payer: Medicare HMO | Admitting: Occupational Therapy

## 2017-08-07 ENCOUNTER — Inpatient Hospital Stay (HOSPITAL_COMMUNITY): Payer: Medicare HMO | Admitting: Occupational Therapy

## 2017-08-07 LAB — GLUCOSE, CAPILLARY
Glucose-Capillary: 88 mg/dL (ref 65–99)
Glucose-Capillary: 114 mg/dL — ABNORMAL HIGH (ref 65–99)
Glucose-Capillary: 103 mg/dL — ABNORMAL HIGH (ref 65–99)
Glucose-Capillary: 109 mg/dL — ABNORMAL HIGH (ref 65–99)

## 2017-08-07 NOTE — Progress Notes (Deleted)
Occupational Therapy Session Note  Patient Details  Name: Eric Long MRN: [id number] Date of Birth: [birth year]  Today's Date: 08/07/2017 OT Individual Time: 1400-1430 OT Individual Time Calculation (min): 30 min   OT treatment session focused on pt/family education. Provided pt with home fine motor program and reviewed with pt and family. Simulated walk-in shower transfer and had family practice stepping over shower ledge pt. Pt needed min guard A for stepping over. Discussed home set-up and modifications for safe BADL participation in home environment. Pt left seated in wc at end of session with family present and needs met.    See Function Navigator for Current Functional Status.  [name] [name] 08/07/2017, 3:09 PM

## 2017-08-07 NOTE — Progress Notes (Signed)
Occupational Therapy Session Note  Patient Details  Name: Eric Long MRN: [id number] Date of Birth: 07-Sep-1940  Today's Date: 08/07/2017 OT Individual Time: 8677-3736 OT Individual Time Calculation (min): 59 min   Short Term Goals: Week 2:  OT Short Term Goal 1 (Week 2): STG=LTG 2/2 ELOS  Skilled Therapeutic Interventions/Progress Updates:    Pt greeted sitting in wc with son present and agreeable to OT. OT educated pt's son on body position to assist with functional ambulation in the room to collect clothing and get to the bathroom. Pt ambulated with cane with son providing supervision/CGA for intermittent LOB when turning to sit onto tub bench. Bathing completed with supervision. Educated pt's son on how to cue pt to avoid objects on L side, for LLE placement, and how to cue pt for clothing management as pt has tendency to don clothing backwards. Pt left seated in wc at end of session with safety blt on, son present, and needs met.   Therapy Documentation Precautions:  Precautions Precautions: Fall, Other (comment) Precaution Comments: Left visual deficits Restrictions Weight Bearing Restrictions: No Pain: Pain Assessment Pain Scale: 0-10 Pain Score: 0-No pain  See Function Navigator for Current Functional Status.   Therapy/Group: Individual Therapy  Valma Cava 08/07/2017, 10:00 AM

## 2017-08-07 NOTE — Progress Notes (Addendum)
Physical Therapy Session Note  Patient Details  Name: Eric Long MRN: [id number] Date of Birth: 06/15/1940  Today's Date: 08/07/2017 PT Individual Time: 0802-0833 AND 1515-1545 PT Individual Time Calculation (min): 31 min AND 30 min   Short Term Goals: Week 2:  PT Short Term Goal 1 (Week 2): STG=LTG due to ELOS  Skilled Therapeutic Interventions/Progress Updates:   Session 1:  Pt in supine and agreeable to therapy, 2/10 pain in L knee - MD present discussing it w/ pt. Session focused on gait training to assess AD that will be safest for pt to use w/ family upon d/c. Ambulated 100' w/ RW, min guard, verbal and visual cues for obstacle avoidance and L foot management as he tended to catch L foot on RW 2/2 excessive hip ER. Ambulated 100' w/ quad cane, min guard, and decreased verbal/visual cues required for safety. Pt also reports decreased L knee pain w/ cane as he is able to offload WB on L side more easily to compensate for pain. Pt additionally w/ more reciprocal gait pattern w/ quad cane. Returned to room and ended session in w/c, call bell within reach and all needs met. Quick release belt and chair alarm engaged. Ice applied to lateral L knee for pain relief.   Session 2:  Pt in w/c and agreeable to therapy, no c/o pain. Received from speech therapist and family present for family education. Family included daughter, both sons, and wife. Focused on gait and stairs for household mobility. Educated all family members on safe guarding for gait on L hemi side and hand on waist band and while ascending and descending stairs w/ R handrail as per home set-up. Pt has 2-steps to enter home w/ bedroom on 1st floor. All family members verbalized understanding, children returned demonstration of guarding w/ gait and stairs safely. Educated family on verbal cues pt requires w/ all functional mobility including obstacle avoidance on L side, occasional L body awareness, and for safety awareness. Discussed  pt's motor planning and sequencing impairments that can cause him to freeze as he can be unsure of the next step. All family members provided appropriate and safe verbal cues when this happened with them. Cautioned w/ providing too many verbal cues and to stick with cues for safety and obstacle avoidance only, educated that too many verbal cues at once can be overwhelming for pt. All in agreement and verbalized understanding. Educated on general energy conservation strategies when at home or in community (I.e. Use of transport chair) and gradually building up endurance for household tasks. Returned to room and family requesting to end session as they needed to speak w/ social worker prior to when family needed to leave. Ended session in w/c and in care of family, all needs met.   Therapy Documentation Precautions:  Precautions Precautions: Fall Precaution Comments: L visual field cut Restrictions Weight Bearing Restrictions: No General: PT Amount of Missed Time (min): 30 Minutes PT Missed Treatment Reason: Unavailable (Comment)(pt and family requesting to speak with SW) Vital Signs: Therapy Vitals Temp: 98.4 F (36.9 C) Temp Source: Oral Pulse Rate: 87 Resp: 18 BP: 129/60 Patient Position (if appropriate): Sitting Oxygen Therapy SpO2: 96 % O2 Device: Room Air Mobility: Transfers Sit to Stand: 5: Supervision Balance: Balance Balance Assessed: Yes Dynamic Standing Balance Dynamic Standing - Balance Support: During functional activity Dynamic Standing - Level of Assistance: 5: Stand by assistance  See Function Navigator for Current Functional Status.   Therapy/Group: Individual Therapy  Katey Barrie K Arnette 08/07/2017,  6:35 PM

## 2017-08-07 NOTE — Progress Notes (Signed)
Occupational Therapy Session Note  Patient Details  Name: Eric Long MRN: [id number] Date of Birth: Oct 29, 1940  Today's Date: 08/07/2017 OT Individual Time: 1400-1430 OT Individual Time Calculation (min): 30 min   OT treatment session focused on pt/family education. Provided pt with home fine motor program and reviewed with pt and family. Simulated walk-in shower transfer and had family practice stepping over shower ledge pt. Pt needed min guard A for stepping over. Discussed home set-up and modifications for safe BADL participation in home environment. Pt left seated in wc at end of session with family present and needs met.    See Function Navigator for Current Functional Status.  Therapy/Group: Individual Therapy  Valma Cava 08/07/2017, 3:31 PM

## 2017-08-07 NOTE — Progress Notes (Signed)
Speech Language Pathology Daily Session Note  Patient Details  Name: Eric Long MRN: [id number] Date of Birth: [birth year]  Today's Date: 08/07/2017 SLP Individual Time: 4356-8616 SLP Individual Time Calculation (min): 45 min  Short Term Goals: Week 2: SLP Short Term Goal 1 (Week 2): Pt will demonstrate selective attention in moderately distracting environment for ~ 45 minutes with Mod level cues.  SLP Short Term Goal 2 (Week 2): Pt will attend to left side of environment in moderately distracting task for ~ 30 minutes with Mod A cues.  SLP Short Term Goal 3 (Week 2): Pt will solve semi- complex problem solving tasks with Mod A cues.  SLP Short Term Goal 4 (Week 2): Pt will demonstrate intellectual awareness by listing 3 physical and 3 cognitive deficits related to acute CVA with Mod A cues.   Skilled Therapeutic Interventions: Skilled treatment session focused on cognition goals and education with pt, wife and his children. Education provided on cognitive deficits related to right hemisphere stroke, pt's need for moderate assistance when implementing cognitive tasks and handout provided on right hemisphere strokes. Education provided to create routine for home environment to ensure success. All questions answered to their satisfaction. Pt handed off to PT.      Function:  Eating Eating                 Cognition Comprehension Comprehension assist level: Follows basic conversation/direction with extra time/assistive device  Expression   Expression assist level: Expresses basic needs/ideas: With extra time/assistive device  Social Interaction Social Interaction assist level: Interacts appropriately 90% of the time - Needs monitoring or encouragement for participation or interaction.  Problem Solving Problem solving assist level: Solves basic 75 - 89% of the time/requires cueing 10 - 24% of the time;Solves basic 50 - 74% of the time/requires cueing 25 - 49% of the time  Memory  Memory assist level: Recognizes or recalls 75 - 89% of the time/requires cueing 10 - 24% of the time    Pain    Therapy/Group: Individual Therapy  Forestine Macho 08/07/2017, 3:30 PM

## 2017-08-07 NOTE — Plan of Care (Signed)
  Problem: RH BLADDER ELIMINATION Goal: RH STG MANAGE BLADDER WITH ASSISTANCE Description STG Manage Bladder With  Mod Assistance  Outcome: Progressing  Continue to assist pt with urinal emptying.  Problem: RH SAFETY Goal: RH STG ADHERE TO SAFETY PRECAUTIONS W/ASSISTANCE/DEVICE Description STG Adhere to Safety Precautions With mod Assistance/Device.  Outcome: Progressing  Call light at hand, bed alarm, proper footwear.

## 2017-08-07 NOTE — Progress Notes (Signed)
Subjective/Complaints:  Pt aware of d/c date.  Left knee pain onset yesterday No trauma. No prior hx of Left knee pain .  Pain is lateral knee  ROS- neg CP, SOB, N/V/D  Objective: Vital Signs: Blood pressure (!) 129/58, pulse 81, temperature 98.7 F (37.1 C), temperature source Oral, resp. rate 18, height 5' 10.5" (1.791 m), weight 103 kg (227 lb 1.2 oz), SpO2 97 %. No results found. Results for orders placed or performed during the hospital encounter of 07/26/17 (from the past 72 hour(s))  Glucose, capillary     Status: None   Collection Time: 08/04/17 11:55 AM  Result Value Ref Range   Glucose-Capillary 87 65 - 99 mg/dL  Glucose, capillary     Status: None   Collection Time: 08/04/17  4:32 PM  Result Value Ref Range   Glucose-Capillary 85 65 - 99 mg/dL  Glucose, capillary     Status: None   Collection Time: 08/04/17  8:34 PM  Result Value Ref Range   Glucose-Capillary 81 65 - 99 mg/dL  Glucose, capillary     Status: None   Collection Time: 08/05/17  6:42 AM  Result Value Ref Range   Glucose-Capillary 90 65 - 99 mg/dL  Glucose, capillary     Status: None   Collection Time: 08/05/17 11:59 AM  Result Value Ref Range   Glucose-Capillary 80 65 - 99 mg/dL  Glucose, capillary     Status: None   Collection Time: 08/05/17  4:52 PM  Result Value Ref Range   Glucose-Capillary 75 65 - 99 mg/dL  Glucose, capillary     Status: None   Collection Time: 08/05/17  9:27 PM  Result Value Ref Range   Glucose-Capillary 82 65 - 99 mg/dL  Glucose, capillary     Status: None   Collection Time: 08/06/17  6:19 AM  Result Value Ref Range   Glucose-Capillary 86 65 - 99 mg/dL  Basic metabolic panel     Status: Abnormal   Collection Time: 08/06/17  6:34 AM  Result Value Ref Range   Sodium 137 135 - 145 mmol/L   Potassium 3.9 3.5 - 5.1 mmol/L   Chloride 101 101 - 111 mmol/L   CO2 26 22 - 32 mmol/L   Glucose, Bld 93 65 - 99 mg/dL   BUN 14 6 - 20 mg/dL   Creatinine, Ser 1.31 (H) 0.61 - 1.24  mg/dL   Calcium 9.0 8.9 - 10.3 mg/dL   GFR calc non Af Amer 50 (L) >60 mL/min   GFR calc Af Amer 58 (L) >60 mL/min    Comment: (NOTE) The eGFR has been calculated using the CKD EPI equation. This calculation has not been validated in all clinical situations. eGFR's persistently <60 mL/min signify possible Chronic Kidney Disease.    Anion gap 10 5 - 15    Comment: Performed at [facility] [address] [place], [place] [address]  CBC     Status: Abnormal   Collection Time: 08/06/17  6:34 AM  Result Value Ref Range   WBC 9.9 4.0 - 10.5 K/uL   RBC 3.83 (L) 4.22 - 5.81 MIL/uL   Hemoglobin 11.2 (L) 13.0 - 17.0 g/dL   HCT 35.5 (L) 39.0 - 52.0 %   MCV 92.7 78.0 - 100.0 fL   MCH 29.2 26.0 - 34.0 pg   MCHC 31.5 30.0 - 36.0 g/dL   RDW 13.8 11.5 - 15.5 %   Platelets 256 150 - 400 K/uL    Comment:  Performed at [facility], [place] [address], [place], [place] [address]  Glucose, capillary     Status: None   Collection Time: 08/06/17 11:33 AM  Result Value Ref Range   Glucose-Capillary 84 65 - 99 mg/dL  Glucose, capillary     Status: None   Collection Time: 08/06/17  5:01 PM  Result Value Ref Range   Glucose-Capillary 81 65 - 99 mg/dL  Glucose, capillary     Status: None   Collection Time: 08/06/17  9:26 PM  Result Value Ref Range   Glucose-Capillary 71 65 - 99 mg/dL  Glucose, capillary     Status: None   Collection Time: 08/07/17  6:13 AM  Result Value Ref Range   Glucose-Capillary 88 65 - 99 mg/dL     HEENT: normal Cardio: RRR and no murmur Resp: CTA B/L and unlabored GI: BS positive and NT, ND Extremity:  No Edema Skin:   Intact Neuro: Alert/Oriented, Abnormal Sensory Reduced sensation LT, Left foot, Abnormal Motor 4/5 Left delt, Bi, Tri, grip, HF, KE, ADF, Abnormal FMC Ataxic/ dec FMC and Inattention , Left side Musc/Skel:  Other no pain with UE or LE ROM Gen NAD Left homonymous hemianopsia  Assessment/Plan: 1. Functional deficits secondary to Left  hemiparesis and Left homonymous hemianopsia due to R MCA infarct which require 3+ hours per day of interdisciplinary therapy in a comprehensive inpatient rehab setting. Physiatrist is providing close team supervision and 24 hour management of active medical problems listed below. Physiatrist and rehab team continue to assess barriers to discharge/monitor patient progress toward functional and medical goals. FIM: Function - Bathing Position: Shower Body parts bathed by patient: Right arm, Left arm, Chest, Abdomen, Front perineal area, Right upper leg, Left upper leg, Right lower leg, Left lower leg, Buttocks Body parts bathed by helper: Back Assist Level: Supervision or verbal cues  Function- Upper Body Dressing/Undressing What is the patient wearing?: Pull over shirt/dress Pull over shirt/dress - Perfomed by patient: Thread/unthread right sleeve, Thread/unthread left sleeve, Put head through opening, Pull shirt over trunk Pull over shirt/dress - Perfomed by helper: Thread/unthread left sleeve Assist Level: Supervision or verbal cues Function - Lower Body Dressing/Undressing What is the patient wearing?: Underwear, Pants, Socks, Shoes Position: Wheelchair/chair at [employer] - Performed by patient: Thread/unthread right underwear leg, Thread/unthread left underwear leg, Pull underwear up/down Pants- Performed by patient: Thread/unthread right pants leg, Thread/unthread left pants leg, Pull pants up/down Pants- Performed by helper: Pull pants up/down Non-skid slipper socks- Performed by patient: Don/doff right sock, Don/doff left sock Socks - Performed by patient: Don/doff right sock, Don/doff left sock Socks - Performed by helper: Don/doff right sock, Don/doff left sock Shoes - Performed by patient: Don/doff right shoe, Fasten right, Don/doff left shoe, Fasten left Shoes - Performed by helper: Fasten left Assist for footwear: Supervision/touching assist Assist for lower body dressing:  Supervision or verbal cues  Function - Toileting Toileting steps completed by patient: Adjust clothing prior to toileting, Performs perineal hygiene, Adjust clothing after toileting Toileting steps completed by helper: Adjust clothing after toileting Assist level: Touching or steadying assistance (Pt.75%)  Function - Toilet Transfers Toilet transfer assistive device: Grab bar, Cane Assist level to toilet: Touching or steadying assistance (Pt > 75%) Assist level from toilet: Touching or steadying assistance (Pt > 75%)  Function - Chair/bed transfer Chair/bed transfer method: Stand pivot, Ambulatory Chair/bed transfer assist level: Touching or steadying assistance (Pt > 75%) Chair/bed transfer assistive device: Armrests, Cane Chair/bed transfer details: Verbal cues  for precautions/safety, Verbal cues for technique, Verbal cues for sequencing, Tactile cues for posture  Function - Locomotion: Wheelchair Will patient use wheelchair at discharge?: Yes Type: Manual Max wheelchair distance: 150 ft Assist Level: Supervision or verbal cues Assist Level: Supervision or verbal cues Assist Level: Supervision or verbal cues Turns around,maneuvers to table,bed, and toilet,negotiates 3% grade,maneuvers on rugs and over doorsills: No Function - Locomotion: Ambulation Assistive device: Walker-rolling, Cane-quad Max distance: 200 ft Assist level: Touching or steadying assistance (Pt > 75%) Assist level: Touching or steadying assistance (Pt > 75%) Assist level: Touching or steadying assistance (Pt > 75%) Walk 150 feet activity did not occur: Safety/medical concerns Assist level: Touching or steadying assistance (Pt > 75%) Walk 10 feet on uneven surfaces activity did not occur: Safety/medical concerns  Function - Comprehension Comprehension: Auditory Comprehension assist level: Follows basic conversation/direction with extra time/assistive device  Function - Expression Expression:  Verbal Expression assist level: Expresses basic needs/ideas: With extra time/assistive device  Function - Social Interaction Social Interaction assist level: Interacts appropriately 90% of the time - Needs monitoring or encouragement for participation or interaction.  Function - Problem Solving Problem solving assist level: Solves basic 75 - 89% of the time/requires cueing 10 - 24% of the time  Function - Memory Memory assist level: Recognizes or recalls 75 - 89% of the time/requires cueing 10 - 24% of the time Patient normally able to recall (first 3 days only): Current season, Staff names and faces, That he or she is in a hospital, Location of own room  Medical Problem List and Plan: 1.   Left hemiparesis, Left homonymous hemianopsia secondary to right posterior MCA infarct.CIR CIR PT, OT,SLP ,2. DVT Prophylaxis/Anticoagulation: Pharmaceutical:Lovenox 3. Pain Management:N/A 4. Mood:LCSW to follow for evaluation and support. 5. Neuropsych: This patientiscapable of making decisions on hisown behalf. 6. Skin/Wound Care:routine pressure relief measures. 7. Fluids/Electrolytes/Nutrition:Monitor I/O. Offer supplements prn if intake poor. 8. HTN: Monitor BP bid. Continuetoprol XL Vitals:   08/06/17 1450 08/07/17 0543  BP: (!) 116/59 (!) 129/58  Pulse: 79 81  Resp:  18  Temp: 98.1 F (36.7 C) 98.7 F (37.1 C)  SpO2: 95% 97%  improved, on amlodipine /benzapril at home, continue amlodipine 2.'5mg'$  increase toprol to '50mg'$  first dose 4/3 9.T2DM: Hgb A1C-7.6. Used Lantus with breakfast,[place] lunchand [place] with supper. Wife to bring in meds..Monitor BS ac/hs and continue Lantus with SSI. CBG (last 3)  Recent Labs    08/06/17 1701 08/06/17 2126 08/07/17 0613  GLUCAP 81 71 88  Controlled 4/4 10. CKDstage III:GFR stable  11. Normocytic anemia:Monitor --will add iron supplement. 12. Hyperlipidemia: On Crestor and zetia.    LOS (Days) 12 A FACE TO FACE  EVALUATION WAS PERFORMED  Charlett Blake 08/07/2017, 8:07 AM

## 2017-08-08 ENCOUNTER — Inpatient Hospital Stay (HOSPITAL_COMMUNITY): Payer: Medicare HMO | Admitting: Physical Therapy

## 2017-08-08 ENCOUNTER — Inpatient Hospital Stay (HOSPITAL_COMMUNITY): Payer: Medicare HMO

## 2017-08-08 ENCOUNTER — Inpatient Hospital Stay (HOSPITAL_COMMUNITY): Payer: Medicare HMO | Admitting: Speech Pathology

## 2017-08-08 LAB — GLUCOSE, CAPILLARY
GLUCOSE-CAPILLARY: 95 mg/dL (ref 65–99)
GLUCOSE-CAPILLARY: 92 mg/dL (ref 65–99)
Glucose-Capillary: 74 mg/dL (ref 65–99)
Glucose-Capillary: 71 mg/dL (ref 65–99)

## 2017-08-08 NOTE — Progress Notes (Signed)
Social Work  Discharge Note  The overall goal for the admission was met for: Sat 4/6  Discharge location: [place] TO PROVIDE 24 HR CARE  Length of Stay: Yes-13 DAYS  Discharge activity level: Yes-SUPERVISION-MIN ASSIST   Home/community participation: Yes  Services provided included: MD, RD, PT, OT, SLP, RN, CM, TR, Pharmacy, Neuropsych and SW  Financial Services: Private Insurance: [employer]  Follow-up services arranged: Outpatient: [facility],OT,SP 4/9 8:30-10:15 ( OT & SP) AND 4/15 8:00-8:45 (PT), DME: [facility]- and Patient/Family has no preference for HH/DME agencies [facility], [facility]  Comments (or additional information):WHOLE FAMILY HERE FOR EDUCATION ON THURSDAY AND WENT WELL. AWARE WILL NEED 24 HR CARE UPON DISCHARGE.  Patient/Family verbalized understanding of follow-up arrangements: Yes  Individual responsible for coordination of the follow-up plan: [name]-WIFE AND CHILDREN  Confirmed correct DME delivered: Elease Hashimoto 08/08/2017    Elease Hashimoto

## 2017-08-08 NOTE — Progress Notes (Signed)
Physical Therapy Discharge Summary  Patient Details  Name: Eric Long MRN: [id number] Date of Birth: 1940-09-28  Today's Date: 08/08/2017 PT Individual Time: 1345-1500 PT Individual Time Calculation (min): 75 min   Pt in w/c and agreeable to therapy, denies pain. Focused on functional mobility as detailed below in discharge summary. Additionally re-iterated important points from family education regarding gait and stair negotiation. Both sons and wife present during session. Emphasized providing cues for safety w/ mobility and educated on sequencing impairments pt has w/ mobility including helpful verbal cues that work best for pt to transition safely. Performed car transfer and bed transfer to simulate height of bed at home. Pt has bed at home that is 36" from ground, practiced performing hip hike to transition to sitting EOB w/ supervision overall, but many verbal and tactile cues for technique. Decreased cues required and increased safety when pt was using handrail. Discussed w/ family option of buying temporary rail to put under mattress for safety getting in and out of bed at home or using bed in spare bedroom which is lower to ground. All verbalized understanding and in agreement. Ended session in w/c and in care of family, all needs met.   Patient has met 4 of 6 long term goals due to improved activity tolerance, improved balance, improved postural control, increased strength, improved attention, improved awareness and improved coordination.  Patient to discharge at an ambulatory level [place].   Patient's care partner requires assistance to provide the necessary physical assistance at discharge. Pt has 3 grown children that have been trained on gait and transfer techniques and plan to assist wife w/ safe mobility around the home upon d/c.   Reasons goals not met: Pt requires min guard assistance w/ all ambulation due to occasional tactile and verbal cues for safe gait pattern.    Recommendation:  Patient will benefit from ongoing skilled PT services in outpatient setting to continue to advance safe functional mobility, address ongoing impairments in functional balance, gait pattern, endurance, sequencing and motor planning, and minimize fall risk.  Equipment: w/c, quad cane  Reasons for discharge: treatment goals met and discharge from hospital  Patient/family agrees with progress made and goals achieved: Yes  PT Discharge Precautions/Restrictions Precautions Precautions: Fall Precaution Comments: L visual field cut Restrictions Weight Bearing Restrictions: No Vision/Perception  Vision - Assessment Eye Alignment: Within Functional Limits Ocular Range of Motion: Within Functional Limits Alignment/Gaze Preference: Within Defined Limits Tracking/Visual Pursuits: Decreased smoothness of eye movement to LEFT superior field;Decreased smoothness of eye movement to RIGHT inferior field;Decreased smoothness of horizontal tracking;Decreased smoothness of vertical tracking;Requires cues, head turns, or add eye shifts to track;Decreased smoothness of eye movement to RIGHT superior field;Decreased smoothness of eye movement to LEFT inferior field Saccades: Overshoots Convergence: Within functional limits Perception Perception: Impaired Inattention/Neglect: Does not attend to left visual field;Does not attend to left side of body Praxis Praxis: Intact  Cognition Overall Cognitive Status: Impaired/Different from baseline Arousal/Alertness: Awake/alert Orientation Level: Oriented X4 Attention: Selective Selective Attention: Impaired Memory: Impaired Awareness: Impaired Problem Solving: Impaired Reasoning: Impaired Sequencing: Impaired Organizing: Impaired Self Monitoring: Impaired Self Correcting: Impaired Safety/Judgment: Impaired Comments: Decreased awareness of deficits Sensation Sensation Light Touch: Impaired by gross assessment(reports diminished  sensation in RLE laterally more so than medially) Proprioception: Impaired Detail Proprioception Impaired Details: Impaired LUE Additional Comments: Impaired proprioception throughout, distal>proximal  Coordination Gross Motor Movements are Fluid and Coordinated: No Fine Motor Movements are Fluid and Coordinated: No Motor  Motor Motor: Hemiplegia Motor -  Skilled Clinical Observations: mild L hemi Motor - Discharge Observations: mild L hemi  Mobility Bed Mobility Bed Mobility: Rolling Right;Rolling Left;Supine to Sit;Sit to Supine Rolling Right: 5: Supervision Rolling Left: 5: Supervision Supine to Sit: 5: Supervision Supine to Sit Details: Verbal cues for precautions/safety Sit to Supine: 5: Supervision Sit to Supine - Details: Verbal cues for precautions/safety Transfers Transfers: Yes Sit to Stand: 5: Supervision Stand Pivot Transfers: 5: Supervision Stand Pivot Transfer Details: Verbal cues for precautions/safety;Verbal cues for sequencing Locomotion  Ambulation Ambulation: Yes Ambulation/Gait Assistance: 4: Min guard Ambulation Distance (Feet): 75 Feet Assistive device: Small based quad cane Gait Gait velocity: Decreased Stairs / Additional Locomotion Stairs: Yes Stairs Assistance: 4: Min guard Stair Management Technique: One rail Right Number of Stairs: 4 Height of Stairs: 6 Wheelchair Mobility Wheelchair Mobility: Yes Wheelchair Assistance: 5: Careers information officer: Both lower extermities;Both upper extremities Wheelchair Parts Management: Supervision/cueing Distance: 150'  Trunk/Postural Assessment  Cervical Assessment Cervical Assessment: Within Functional Limits Thoracic Assessment Thoracic Assessment: Within Functional Limits Lumbar Assessment Lumbar Assessment: Within Functional Limits Postural Control Postural Control: Within Functional Limits  Balance Balance Balance Assessed: Yes Static Sitting Balance Static Sitting - Level of  Assistance: 5: Stand by assistance Dynamic Sitting Balance Dynamic Sitting - Balance Support: Feet supported;No upper extremity supported;During functional activity Dynamic Sitting - Level of Assistance: 5: Stand by assistance Static Standing Balance Static Standing - Level of Assistance: 5: Stand by assistance Dynamic Standing Balance Dynamic Standing - Balance Support: During functional activity;No upper extremity supported Dynamic Standing - Level of Assistance: 5: Stand by assistance Extremity Assessment  RLE Assessment RLE Assessment: Within Functional Limits LLE Assessment LLE Assessment: Exceptions to WFL(4 to 4+/5 globally)   See Function Navigator for Current Functional Status.  Marticia Reifschneider K Arnette 08/08/2017, 7:21 PM

## 2017-08-08 NOTE — Progress Notes (Signed)
Social Work Patient ID: Eric Long, male   DOB: 1940/07/07, 77 y.o.   MRN: [id number]  Family education with wife and children yesterday and it went well. Aware he will require 24 hr care upon discharge. Prefer OP therapies and are checking on equipment get confirmation today.

## 2017-08-08 NOTE — Progress Notes (Signed)
Occupational Therapy Session Note  Patient Details  Name: Eric Long MRN: [id number] Date of Birth: [birth year]  Today's Date: 08/08/2017 OT Individual Time: 5945-8592 OT Individual Time Calculation (min): 61 min    Short Term Goals: Week 2:  OT Short Term Goal 1 (Week 2): STG=LTG 2/2 ELOS  Skilled Therapeutic Interventions/Progress Updates:    Pt received supine in bed with no c/o pain and agreeable to therapy. Pt completed 31f of functional mobility using quad cane into shower, requiring min guard throughout and occasionally manual facilitation to complete L foot swing, as well as vc for positioning L LE throughout mobility. Pt required CGA to transfer onto shower bench, demonstrating good safety awareness throughout transfer. From seated level pt able to complete all bathing with set up. (S) provided during standing level bathing for peri areas. Min A provided for transfer out of shower into w/c d/t slippery floor. Pt completed oral care and shaving task at sink with set up, requiring increased time to complete all tasks. Pt able to don shirt at seated level with set up and increased time to problem solve through positioning shirt correctly. Pt completed LB dressing, requiring vc to re-analyze pants d/t pt putting them on backwards. Discussion with pt throughout session re d/c planning. Pt left sitting up in w/c with all needs met and quick release belt donned.   Therapy Documentation Precautions:  Precautions Precautions: Fall Precaution Comments: L visual field cut Restrictions Weight Bearing Restrictions: No   Vital Signs: Therapy Vitals Pulse Rate: 86 BP: (!) 140/59 Pain: Pain Assessment Pain Score: 0-No pain Faces Pain Scale: No hurt ADL: ADL Eating: Independent Grooming: Independent Upper Body Bathing: Modified independent Lower Body Bathing: Supervision/safety Where Assessed-Lower Body Bathing: Shower Upper Body Dressing: Minimal cueing Lower Body Dressing:  Minimal cueing Toileting: Supervision/safety Toilet Transfer: Close supervision TScience writer GEmergency planning/management officerTransfer: CCuratorMethod: AHeritage manager SCivil engineer, contractingwith back  See FNurse, children'sfor Current Functional Status.   Therapy/Group: Individual Therapy  SCurtis Sites4/09/2017, 10:50 AM

## 2017-08-08 NOTE — Progress Notes (Signed)
Resting throughout shift,respiration even and unlabored on RA, easily aroused and cooperative denies pain or discomfort, Call bell within reach,bed alarm on , monitor and assisted

## 2017-08-08 NOTE — Progress Notes (Signed)
  Occupational Therapy Discharge Summary  Patient Details  Name: Eric Long MRN: [id number] Date of Birth: October 27, 1940    Patient has met 12 of 46 long term goals due to improved activity tolerance, improved balance, postural control, ability to compensate for deficits, functional use of  LEFT upper and LEFT lower extremity, improved attention, improved awareness and improved coordination.  Patient to discharge at overall Supervision level.  Patient's care partner is independent to provide the necessary physical assistance at discharge for higher level iADL tasks and walk-in shower transfers.   Reasons goals not met: Pt needs contact guard A to step over shower ledge of walk-in shower  Recommendation:  Patient will benefit from ongoing skilled OT services in outpatient setting to continue to advance functional skills in the area of BADL and functional use of L UE.  Equipment: shower chair (see PT note for mobility devices)  Reasons for discharge: treatment goals met and discharge from hospital  Patient/family agrees with progress made and goals achieved: Yes  OT Discharge Precautions/Restrictions  Precautions Precautions: Fall Precaution Comments: L visual field cut Restrictions Weight Bearing Restrictions: No  Pain  None/denies pain ADL ADL Eating: Independent Grooming: Independent Upper Body Bathing: Modified independent Lower Body Bathing: Supervision/safety Where Assessed-Lower Body Bathing: Shower Upper Body Dressing: Minimal cueing Lower Body Dressing: Minimal cueing Toileting: Supervision/safety Toilet Transfer: Close supervision Science writer: Energy manager: Curator Method: Heritage manager: Civil engineer, contracting with back Vision Additional Comments: L visual fied cut Perception  Perception: Impaired Inattention/Neglect: Does not attend to left Publishing rights manager Arousal/Alertness: Awake/alert Orientation Level: Oriented X4 Sensation Sensation Light Touch: Impaired by gross assessment Proprioception: Impaired Detail Proprioception Impaired Details: Impaired LUE;Impaired LLE Coordination Gross Motor Movements are Fluid and Coordinated: No Fine Motor Movements are Fluid and Coordinated: No Coordination and Movement Description: decreased smoothness Motor  Motor Motor: Hemiplegia Motor - Discharge Observations: Mild L hemiplegia, improved since eval Mobility  Transfers Sit to Stand: 5: Supervision  Balance Balance Balance Assessed: Yes Dynamic Standing Balance Dynamic Standing - Balance Support: During functional activity Dynamic Standing - Level of Assistance: 5: Stand by assistance Extremity/Trunk Assessment RUE Assessment RUE Assessment: Within Functional Limits LUE Assessment LUE Assessment: Within Functional Limits LUE Strength LUE Overall Strength Comments: Strength is WFL, deficits in coordination   See Function Navigator for Current Functional Status.  [name] Jirah Rider 08/08/2017, 2:59 PM

## 2017-08-08 NOTE — Progress Notes (Addendum)
Subjective/Complaints:  Discussed D/C process  ROS- neg CP, SOB, N/V/D  Objective: Vital Signs: Blood pressure (!) 140/59, pulse 86, temperature 98.5 F (36.9 C), temperature source Oral, resp. rate 12, height 5' 10.5" (1.791 m), weight 103 kg (227 lb 1.2 oz), SpO2 99 %. No results found. Results for orders placed or performed during the hospital encounter of 07/26/17 (from the past 72 hour(s))  Glucose, capillary     Status: None   Collection Time: 08/05/17 11:59 AM  Result Value Ref Range   Glucose-Capillary 80 65 - 99 mg/dL  Glucose, capillary     Status: None   Collection Time: 08/05/17  4:52 PM  Result Value Ref Range   Glucose-Capillary 75 65 - 99 mg/dL  Glucose, capillary     Status: None   Collection Time: 08/05/17  9:27 PM  Result Value Ref Range   Glucose-Capillary 82 65 - 99 mg/dL  Glucose, capillary     Status: None   Collection Time: 08/06/17  6:19 AM  Result Value Ref Range   Glucose-Capillary 86 65 - 99 mg/dL  Basic metabolic panel     Status: Abnormal   Collection Time: 08/06/17  6:34 AM  Result Value Ref Range   Sodium 137 135 - 145 mmol/L   Potassium 3.9 3.5 - 5.1 mmol/L   Chloride 101 101 - 111 mmol/L   CO2 26 22 - 32 mmol/L   Glucose, Bld 93 65 - 99 mg/dL   BUN 14 6 - 20 mg/dL   Creatinine, Ser 1.31 (H) 0.61 - 1.24 mg/dL   Calcium 9.0 8.9 - 10.3 mg/dL   GFR calc non Af Amer 50 (L) >60 mL/min   GFR calc Af Amer 58 (L) >60 mL/min    Comment: (NOTE) The eGFR has been calculated using the CKD EPI equation. This calculation has not been validated in all clinical situations. eGFR's persistently <60 mL/min signify possible Chronic Kidney Disease.    Anion gap 10 5 - 15    Comment: Performed at [facility] [address] [place], [place] [address]  CBC     Status: Abnormal   Collection Time: 08/06/17  6:34 AM  Result Value Ref Range   WBC 9.9 4.0 - 10.5 K/uL   RBC 3.83 (L) 4.22 - 5.81 MIL/uL   Hemoglobin 11.2 (L) 13.0 - 17.0 g/dL   HCT  35.5 (L) 39.0 - 52.0 %   MCV 92.7 78.0 - 100.0 fL   MCH 29.2 26.0 - 34.0 pg   MCHC 31.5 30.0 - 36.0 g/dL   RDW 13.8 11.5 - 15.5 %   Platelets 256 150 - 400 K/uL    Comment: Performed at [facility], [place] [address], [place],  [address]  Glucose, capillary     Status: None   Collection Time: 08/06/17 11:33 AM  Result Value Ref Range   Glucose-Capillary 84 65 - 99 mg/dL  Glucose, capillary     Status: None   Collection Time: 08/06/17  5:01 PM  Result Value Ref Range   Glucose-Capillary 81 65 - 99 mg/dL  Glucose, capillary     Status: None   Collection Time: 08/06/17  9:26 PM  Result Value Ref Range   Glucose-Capillary 71 65 - 99 mg/dL  Glucose, capillary     Status: None   Collection Time: 08/07/17  6:13 AM  Result Value Ref Range   Glucose-Capillary 88 65 - 99 mg/dL  Glucose, capillary     Status: Abnormal  Collection Time: 08/07/17 12:04 PM  Result Value Ref Range   Glucose-Capillary 114 (H) 65 - 99 mg/dL  Glucose, capillary     Status: Abnormal   Collection Time: 08/07/17  4:41 PM  Result Value Ref Range   Glucose-Capillary 103 (H) 65 - 99 mg/dL  Glucose, capillary     Status: Abnormal   Collection Time: 08/07/17 10:06 PM  Result Value Ref Range   Glucose-Capillary 109 (H) 65 - 99 mg/dL  Glucose, capillary     Status: None   Collection Time: 08/08/17  6:22 AM  Result Value Ref Range   Glucose-Capillary 95 65 - 99 mg/dL     HEENT: normal Cardio: RRR and no murmur Resp: CTA B/L and unlabored GI: BS positive and NT, ND Extremity:  No Edema Skin:   Intact Neuro: Alert/Oriented, Abnormal Sensory Reduced sensation LT, Left foot, Abnormal Motor 4/5 Left delt, Bi, Tri, grip, HF, KE, ADF, Abnormal FMC Ataxic/ dec FMC and Inattention , Left side Musc/Skel:  Other no pain with UE or LE ROM Gen NAD Left homonymous hemianopsia  Assessment/Plan: 1. Functional deficits secondary to Left hemiparesis and Left homonymous hemianopsia due to R MCA infarct which require  3+ hours per day of interdisciplinary therapy in a comprehensive inpatient rehab setting. Physiatrist is providing close team supervision and 24 hour management of active medical problems listed below. Physiatrist and rehab team continue to assess barriers to discharge/monitor patient progress toward functional and medical goals. FIM: Function - Bathing Position: Shower Body parts bathed by patient: Right arm, Left arm, Chest, Buttocks, Front perineal area, Abdomen, Right upper leg, Left upper leg, Right lower leg, Left lower leg Body parts bathed by helper: Back Assist Level: Supervision or verbal cues  Function- Upper Body Dressing/Undressing What is the patient wearing?: Pull over shirt/dress Pull over shirt/dress - Perfomed by patient: Thread/unthread right sleeve, Thread/unthread left sleeve, Put head through opening, Pull shirt over trunk Pull over shirt/dress - Perfomed by helper: Thread/unthread left sleeve Assist Level: Supervision or verbal cues Function - Lower Body Dressing/Undressing What is the patient wearing?: Underwear, Pants, Socks, Shoes Position: Wheelchair/chair at [employer] - Performed by patient: Thread/unthread right underwear leg, Thread/unthread left underwear leg, Pull underwear up/down Pants- Performed by patient: Thread/unthread right pants leg, Thread/unthread left pants leg, Pull pants up/down Pants- Performed by helper: Pull pants up/down Non-skid slipper socks- Performed by patient: Don/doff right sock, Don/doff left sock Socks - Performed by patient: Don/doff right sock, Don/doff left sock Socks - Performed by helper: Don/doff right sock, Don/doff left sock Shoes - Performed by patient: Don/doff right shoe, Don/doff left shoe, Fasten left, Fasten right Shoes - Performed by helper: Fasten left Assist for footwear: Supervision/touching assist Assist for lower body dressing: Supervision or verbal cues  Function - Toileting Toileting steps completed by  patient: Adjust clothing prior to toileting, Performs perineal hygiene Toileting steps completed by helper: Adjust clothing after toileting Assist level: Supervision or verbal cues  Function - Toilet Transfers Toilet transfer assistive device: Grab bar, Cane Assist level to toilet: Supervision or verbal cues Assist level from toilet: Supervision or verbal cues  Function - Chair/bed transfer Chair/bed transfer method: Stand pivot, Ambulatory Chair/bed transfer assist level: Touching or steadying assistance (Pt > 75%) Chair/bed transfer assistive device: Armrests, Cane Chair/bed transfer details: Verbal cues for precautions/safety, Verbal cues for technique, Verbal cues for sequencing  Function - Locomotion: Wheelchair Will patient use wheelchair at discharge?: Yes Type: Manual Max wheelchair distance: 150 ft Assist Level: Supervision or  verbal cues Assist Level: Supervision or verbal cues Assist Level: Supervision or verbal cues Turns around,maneuvers to table,bed, and toilet,negotiates 3% grade,maneuvers on rugs and over doorsills: No Function - Locomotion: Ambulation Assistive device: Walker-rolling, Cane-quad Max distance: 150' Assist level: Touching or steadying assistance (Pt > 75%) Assist level: Touching or steadying assistance (Pt > 75%) Assist level: Touching or steadying assistance (Pt > 75%) Walk 150 feet activity did not occur: Safety/medical concerns Assist level: Touching or steadying assistance (Pt > 75%) Walk 10 feet on uneven surfaces activity did not occur: Safety/medical concerns  Function - Comprehension Comprehension: Auditory Comprehension assist level: Follows basic conversation/direction with extra time/assistive device, Understands basic 90% of the time/cues < 10% of the time  Function - Expression Expression: Verbal Expression assist level: Expresses basic needs/ideas: With extra time/assistive device  Function - Social Interaction Social Interaction  assist level: Interacts appropriately 90% of the time - Needs monitoring or encouragement for participation or interaction.  Function - Problem Solving Problem solving assist level: Solves basic 50 - 74% of the time/requires cueing 25 - 49% of the time, Solves basic 25 - 49% of the time - needs direction more than half the time to initiate, plan or complete simple activities  Function - Memory Memory assist level: Recognizes or recalls 90% of the time/requires cueing < 10% of the time Patient normally able to recall (first 3 days only): Current season, Staff names and faces, That he or she is in a hospital, Location of own room  Medical Problem List and Plan: 1.   Left hemiparesis, Left homonymous hemianopsia secondary to right posterior MCA infarct.CIR CIR PT, OT,SLP tent d/c 4/6  ,2. DVT Prophylaxis/Anticoagulation: Pharmaceutical:Lovenox 3. Pain Management:N/A 4. Mood:LCSW to follow for evaluation and support. 5. Neuropsych: This patientiscapable of making decisions on hisown behalf. 6. Skin/Wound Care:routine pressure relief measures. 7. Fluids/Electrolytes/Nutrition:Monitor I/O. Offer supplements prn if intake poor. 8. HTN: Monitor BP bid. Continuetoprol XL Vitals:   08/08/17 0535 08/08/17 0844  BP: (!) 138/57 (!) 140/59  Pulse: 96 86  Resp: 12   Temp: 98.5 F (36.9 C)   SpO2: 99%   improved, on amlodipine /benzapril at home, continue amlodipine 2.'5mg'$  increase toprol to '50mg'$  first dose 4/3, may need to make further adjustments as outpt 9.T2DM: Hgb A1C-7.6. Used Lantus with breakfast,Vict[place]ith lunchand [place] with supper. Wife to bring in meds..Monitor BS ac/hs and continue Lantus with SSI. CBG (last 3)  Recent Labs    08/07/17 1641 08/07/17 2206 08/08/17 0622  GLUCAP 103* 109* 95  Controlled 4/5 10. CKDstage III:GFR stable  11. Normocytic anemia:Monitor --will add iron supplement. 12. Hyperlipidemia: On Crestor and zetia.    LOS (Days)  13 A FACE TO FACE EVALUATION WAS PERFORMED  Charlett Blake 08/08/2017, 10:03 AM

## 2017-08-08 NOTE — Progress Notes (Signed)
Speech Language Pathology Discharge Summary  Patient Details  Name: Eric Long MRN: [id number] Date of Birth: [birth year]  Today's Date: 08/08/2017 SLP Individual Time: 0730-0830 SLP Individual Time Calculation (min): 60 min   Skilled Therapeutic Interventions:  Skilled treatment session focused on cognition goals. SLP facilitated session by providing Min A for emergent/anticipatory awareness within functional tasks of tray management and set up. Pt able to verbalize and locate need to find items on left. He was effectively in locating and opening containers with supervision cues. Pt with several follow up questions from previous session with family and SLP. All questions answered to his satisfaction.     Patient has met 3 of 3 long term goals.  Patient to discharge at overall Mod;Min level.    Clinical Impression/Discharge Summary:   Pt has made good progress towards ST goals and as a result he has met 3 of 3 LTGs. Pt is discharging at [facility] A to Mod A level of support d/t deficits in intellectual/emergent awareness, organization, sequence of tasks, left inattention, decreased selective attention and overall safety awareness. Extensive education provided to family and request follow up Outpatient ST services to decrease safety risk by increasing cognitive function.  Care Partner:  Caregiver Able to Provide Assistance: Yes  Type of Caregiver Assistance: Cognitive;Physical  Recommendation:  Outpatient SLP;24 hour supervision/assistance  Rationale for SLP Follow Up: Maximize cognitive function and independence;Reduce caregiver burden   Equipment:     Reasons for discharge: Treatment goals met;Discharged from hospital   Patient/Family Agrees with Progress Made and Goals Achieved: Yes   Function:  Eating Eating   Modified Consistency Diet: No Eating Assist Level: More than reasonable amount of time;Set up assist for   Eating Set Up Assist For: Opening containers        Cognition Comprehension Comprehension assist level: Follows basic conversation/direction with extra time/assistive device;Understands basic 90% of the time/cues < 10% of the time  Expression   Expression assist level: Expresses basic needs/ideas: With extra time/assistive device  Social Interaction Social Interaction assist level: Interacts appropriately 90% of the time - Needs monitoring or encouragement for participation or interaction.  Problem Solving Problem solving assist level: Solves basic 50 - 74% of the time/requires cueing 25 - 49% of the time;Solves basic 25 - 49% of the time - needs direction more than half the time to initiate, plan or complete simple activities  Memory Memory assist level: Recognizes or recalls 90% of the time/requires cueing < 10% of the time   Courtnie Brenes 08/08/2017, 8:29 AM

## 2017-08-09 LAB — GLUCOSE, CAPILLARY
GLUCOSE-CAPILLARY: 60 mg/dL — AB (ref 65–99)
Glucose-Capillary: 62 mg/dL — ABNORMAL LOW (ref 65–99)
Glucose-Capillary: 72 mg/dL (ref 65–99)

## 2017-08-09 MED ORDER — CLOPIDOGREL BISULFATE 75 MG PO TABS: 75.0000 mg | ORAL_TABLET | Freq: Every day | ORAL | 0 refills | Status: DC

## 2017-08-09 MED ORDER — INSULIN GLARGINE 100 UNIT/ML SOLOSTAR PEN: 20.0000 [IU] | PEN_INJECTOR | Freq: Every day | SUBCUTANEOUS | 0 refills | Status: DC

## 2017-08-09 MED ORDER — ACETAMINOPHEN 325 MG PO TABS: 325.0000 mg | ORAL_TABLET | ORAL | Status: AC | PRN

## 2017-08-09 MED ORDER — POLYSACCHARIDE IRON COMPLEX 150 MG PO CAPS: 150.0000 mg | ORAL_CAPSULE | Freq: Every day | ORAL | 0 refills | Status: DC

## 2017-08-09 MED ORDER — ROSUVASTATIN CALCIUM 40 MG PO TABS: 40.0000 mg | ORAL_TABLET | Freq: Every day | ORAL | 0 refills | Status: AC

## 2017-08-09 MED ORDER — EZETIMIBE 10 MG PO TABS: 10.0000 mg | ORAL_TABLET | Freq: Every day | ORAL | 0 refills | Status: DC

## 2017-08-09 MED ORDER — INSULIN GLARGINE 100 UNIT/ML ~~LOC~~ SOLN: 20.0000 [IU] | Freq: Every day | SUBCUTANEOUS | 11 refills | Status: DC

## 2017-08-09 MED ORDER — METOPROLOL SUCCINATE ER 50 MG PO TB24: 50.0000 mg | ORAL_TABLET | Freq: Every day | ORAL | 0 refills | Status: DC

## 2017-08-09 MED ORDER — AMLODIPINE BESYLATE 2.5 MG PO TABS: 2.5000 mg | ORAL_TABLET | Freq: Every day | ORAL | 0 refills | Status: DC

## 2017-08-09 MED ORDER — BENAZEPRIL HCL 5 MG PO TABS: 5.0000 mg | ORAL_TABLET | Freq: Every day | ORAL | 0 refills | Status: DC

## 2017-08-09 NOTE — Progress Notes (Signed)
Hypoglycemic Event  CBG: 60  Treatment: breakfast  Symptoms: none  Follow-up CBG: Time:0826 CBG Result:62  Possible Reasons for Event: medications; meal  Comments/MD notified:Dr Kwiatkoski; new order: hold lantus today    Jillyn Ledger

## 2017-08-09 NOTE — Progress Notes (Signed)
Patient ID: Eric Long, male   DOB: 05-14-1940, 77 y.o.   MRN: [id number]   Eric Long is a 77 y.o. male who was admitted with left hemiparesis and left homonymous hemianopsia secondary to right posterior MCA infarct.  Patient has received CIR with PT OT and SLP    Subjective: No new complaints. No new problems.  Feels that he is ready for discharge today  Objective: Vital signs in last 24 hours: Temp:  [98.1 F (36.7 C)-98.6 F (37 C)] 98.6 F (37 C) (04/06 0300) Pulse Rate:  [86-104] 104 (04/06 0300) Resp:  [17-18] 17 (04/06 0300) BP: (112-158)/(59-76) 158/76 (04/06 0300) SpO2:  [99 %] 99 % (04/06 0300) Weight change:  Last BM Date: 08/18/17  Intake/Output from previous day: 04/05 0701 - 04/06 0700 In: 840 [P.O.:840] Out: 150 [Urine:150] Last cbgs: CBG (last 3)  Recent Labs    08/08/17 1122 08/08/17 1652 08/08/17 2112  GLUCAP 92 74 71     Physical Exam General: No apparent distress   HEENT: not dry Lungs: Normal effort. Lungs clear to auscultation, no crackles or wheezes. Cardiovascular: Regular rate and rhythm, no edema Abdomen: S/NT/ND; BS(+) Musculoskeletal:  unchanged Neurological: No new neurological deficits.  Mild left-sided weakness Wounds: N/A    Skin: clear  Aging changes Mental state: Alert, oriented, cooperative    Lab Results: BMET    Component Value Date/Time   NA 137 08/06/2017 0634   K 3.9 08/06/2017 0634   CL 101 08/06/2017 0634   CO2 26 08/06/2017 0634   GLUCOSE 93 08/06/2017 0634   BUN 14 08/06/2017 0634   CREATININE 1.31 (H) 08/06/2017 0634   CREATININE 1.48 (H) 03/08/2014 0818   CALCIUM 9.0 08/06/2017 0634   GFRNONAA 50 (L) 08/06/2017 0634   GFRAA 58 (L) 08/06/2017 0634   CBC    Component Value Date/Time   WBC 9.9 08/06/2017 0634   RBC 3.83 (L) 08/06/2017 0634   HGB 11.2 (L) 08/06/2017 0634   HCT 35.5 (L) 08/06/2017 0634   PLT 256 08/06/2017 0634   MCV 92.7 08/06/2017 0634   MCH 29.2 08/06/2017 0634   MCHC  31.5 08/06/2017 0634   RDW 13.8 08/06/2017 0634   LYMPHSABS 2.8 07/26/2017 2357   MONOABS 0.9 07/26/2017 2357   EOSABS 0.3 07/26/2017 2357   BASOSABS 0.0 07/26/2017 2357    Studies/Results: No results found.  Medications: I have reviewed the patient's current medications.  Assessment/Plan:  Status post right posterior MCA infarct Essential hypertension Diabetes mellitus type 2 Chronic kidney disease Dyslipidemia  Patient stable for discharge Discharge planning complete   Length of stay, days: Eric Long , MD 08/09/2017, 7:36 AM

## 2017-08-09 NOTE — Discharge Instructions (Signed)
Inpatient Rehab Discharge Instructions  Eric Long Discharge date and time:  08/09/17  Activities/Precautions/ Functional Status: Activity: no lifting, driving, or strenuous exercise till cleared by MD Diet: cardiac diet and diabetic diet Wound Care: N/A   Functional status:  ___ No restrictions     ___ Walk up steps independently _X__ 24/7 supervision/assistance   ___ Walk up steps with assistance ___ Intermittent supervision/assistance  ___ Bathe/dress independently _X__ Walk with walker/supervision                  _X__ Bathe/dress with supervision.  ___ Walk Independently    ___ Shower independently ___ Walk with assistance    ___ Shower with assistance _X__ No alcohol     ___ Return to work/school ________   Special Instructions: 1. Check blood sugars before meals and at bedtime. Follow up with Dr. Dwyane Dee for further adjustment of diabetic medications.  2. NO driving till cleared by Neurology or Dr. Letta Pate.     COMMUNITY REFERRALS UPON DISCHARGE:     Outpatient: PT, OT, SP  Agency:[facility] Phone:[phone]   Date of Last Service:08/09/2017  Appointment Date/Time:APRIL 9 Tuesday 8:30-10:15 AM AND April 15 Monday 8:00-8:45 AM  Medical Equipment/Items Ordered:WHEELCHAIR, TUB SEAT WITH BACK & [facility]  Agency/Supplier:[facility] [phone]   GENERAL COMMUNITY RESOURCES FOR PATIENT/FAMILY: Support Groups:[facility] SUPPORT GROUP EVERY SECOND Thursday @ 3:00-4:00 PM ON THE REHAB UNIT  QUESTIONS CONTACT [place] [phone]   STROKE/TIA DISCHARGE INSTRUCTIONS SMOKING Cigarette smoking nearly doubles your risk of having a stroke & is the single most alterable risk factor  If you smoke or have smoked in the last 12 months, you are advised to quit smoking for your health.  Most of the excess cardiovascular risk related to smoking disappears within a year of stopping.  Ask you doctor about anti-smoking medications  No Name Quit Line: 1-800-QUIT  NOW  Free Smoking Cessation Classes [phone]  CHOLESTEROL Know your levels; limit fat & cholesterol in your diet  Lipid Panel     Component Value Date/Time   CHOL 274 (H) 07/22/2017 0220   TRIG 218 (H) 07/22/2017 0220   HDL 30 (L) 07/22/2017 0220   CHOLHDL 9.1 07/22/2017 0220   VLDL 44 (H) 07/22/2017 0220   LDLCALC 200 (H) 07/22/2017 0220      Many patients benefit from treatment even if their cholesterol is at goal.  Goal: Total Cholesterol (CHOL) less than 160  Goal:  Triglycerides (TRIG) less than 150  Goal:  HDL greater than 40  Goal:  LDL (LDLCALC) less than 100   BLOOD PRESSURE American Stroke Association blood pressure target is less that 120/80 mm/Hg  Your discharge blood pressure is:  BP: 140/74  Monitor your blood pressure  Limit your salt and alcohol intake  Many individuals will require more than one medication for high blood pressure  DIABETES (A1c is a blood sugar average for last 3 months) Goal HGBA1c is under 7% (HBGA1c is blood sugar average for last 3 months)  Diabetes:     Lab Results  Component Value Date   HGBA1C 7.6 (H) 07/22/2017     Your HGBA1c can be lowered with medications, healthy diet, and exercise.  Check your blood sugar as directed by your physician  Call your physician if you experience unexplained or low blood sugars.  PHYSICAL ACTIVITY/REHABILITATION Goal is 30 minutes at least 4 days per week  Activity: No driving, Therapies: see above Return to work: N/A  Activity decreases your risk  of heart attack and stroke and makes your heart stronger.  It helps control your weight and blood pressure; helps you relax and can improve your mood.  Participate in a regular exercise program.  Talk with your doctor about the best form of exercise for you (dancing, walking, swimming, cycling).  DIET/WEIGHT Goal is to maintain a healthy weight  Your discharge diet is: Diet heart healthy/carb modified Room service appropriate? Yes; Fluid  consistency: Thin  liquids Your height is:  Height: 5' 10.5" (179.1 cm) Your current weight is: Weight: 102.6 kg (226 lb 4.8 oz) Your Body Mass Index (BMI) is:  BMI (Calculated): 32  Following the type of diet specifically designed for you will help prevent another stroke.  Your goal weight is: 176 lbs  Your goal Body Mass Index (BMI) is 19-24.  Healthy food habits can help reduce 3 risk factors for stroke:  High cholesterol, hypertension, and excess weight.  RESOURCES Stroke/Support Group:  Call [phone]   STROKE EDUCATION PROVIDED/REVIEWED AND GIVEN TO PATIENT Stroke warning signs and symptoms How to activate emergency medical system (call 911). Medications prescribed at discharge. Need for follow-up after discharge. Personal risk factors for stroke. Pneumonia vaccine given:  Flu vaccine given:  My questions have been answered, the writing is legible, and I understand these instructions.  I will adhere to these goals & educational materials that have been provided to me after my discharge from the hospital.     My questions have been answered and I understand these instructions. I will adhere to these goals and the provided educational materials after my discharge from the hospital.  Patient/Caregiver Signature _______________________________ Date __________  Clinician Signature _______________________________________ Date __________  Please bring this form and your medication list with you to all your follow-up doctor's appointments.

## 2017-08-09 NOTE — Progress Notes (Signed)
Patient discharged to home per wheelchair accompanied by NT and family. Dr. Asa Lente notified of CBG check. She said patient is good to go home. Family notified. Pam PA called and notified RN that she will be doing AVS. Discharge instructions given no further questions noted.

## 2017-08-09 NOTE — Discharge Summary (Signed)
Physician Discharge Summary  Patient ID: NOLEN LINDAMOOD MRN: [id number] DOB/AGE: Sep 16, 1940 77 y.o.  Admit date: 07/26/2017 Discharge date: 08/09/2017  Discharge Diagnoses:  Principal Problem:   Acute ischemic right MCA stroke [facility]) Active Problems:   Chronic kidney disease, stage III (moderate) (HCC)   Essential hypertension   Type 2 diabetes mellitus with renal manifestations ([place])   Hyperlipidemia   Discharged Condition: stable   Significant Diagnostic Studies: N/A   Labs:  Basic Metabolic Panel: BMP Latest Ref Rng & Units 08/06/2017 07/30/2017 07/26/2017  Glucose 65 - 99 mg/dL 93 83 113(H)  BUN 6 - 20 mg/dL 14 13 15   Creatinine 0.61 - 1.24 mg/dL 1.31(H) 1.20 1.51(H)  Sodium 135 - 145 mmol/L 137 137 137  Potassium 3.5 - 5.1 mmol/L 3.9 3.4(L) 3.8  Chloride 101 - 111 mmol/L 101 99(L) 102  CO2 22 - 32 mmol/L 26 27 26   Calcium 8.9 - 10.3 mg/dL 9.0 9.1 9.1    CBC: CBC Latest Ref Rng & Units 08/06/2017 07/30/2017 07/26/2017  WBC 4.0 - 10.5 K/uL 9.9 8.7 8.7  Hemoglobin 13.0 - 17.0 g/dL 11.2(L) 12.0(L) 11.8(L)  Hematocrit 39.0 - 52.0 % 35.5(L) 37.5(L) 37.0(L)  Platelets 150 - 400 K/uL 256 266 276    CBG: Recent Labs  Lab 08/07/17 2206 08/08/17 0622 08/08/17 1122 08/08/17 1652 08/08/17 2112  GLUCAP 109* 95 92 74 71    Brief HPI:   OFFIE WAIDE is 77 year old right handed male with history of T2DM and hyperlipidemia who was admitted on 07/22/2017 with two day history of unsteady gait and left-sided weakness.  CT of head revealed hypodensity in parietal and temporal lobe consistent with acute/subacute infarct.  CTA head showed 40-55% R-ICA stenosis 80% L- ICA stenosis and severe stenosis left vertebral artery.  MRI of brain done showing large area of acute ischemia within posterior right hemisphere involving the right MCA  And small portion of right PCA question emboli.  Dr. Leonie Man felt right MCA infarct was due to intracranial and extracranial last vessel extraocular  atherosclerosis and dual antiplatelet was recommended.  He is to follow-up with vascular discharge.  Patient with resultant left visual field deficit for safety awareness balance deficits and cognitive deficits.  CIR was recommended due to functional deficits.  Hospital Course: JIMIE KUWAHARA was admitted to rehab 07/26/2017 for inpatient therapies to consist of PT, ST and OT at least three hours five days a week. Past admission physiatrist, therapy team and rehab RN have worked together to provide customized collaborative inpatient rehab. Blood pressures were monitored on bid basis with permissive HTN initially. Low dose Norvasc was resumed and Toprol XL was titrated up to 50 mg daily. Labs done reveal renal status is at base line. Iron supplement was added for normocytic anemia. He is continent of bowel and bladder. Diet was liberalized to help with food choices.   Diabetes has been monitored on ac/hs basis and Tyler Aas was discontinued due to hypoglycemic episodes. He is to continue monitoring BS ac/hs at home and follow up with endocrinology for further titration in medications. He has made good progress during his rehab stay but continues to require verbal and tactile cues to compensate for left visual field deficits and for safety. He currently requires supervision to min assist and will continue to receive follow up out patient PT, OT and ST at [facility] after discharge.   Rehab course: During patient's stay in rehab weekly team conferences were held to monitor patient's progress, set goals  and discuss barriers to discharge. At admission, patient required mod assist with mobility and min to mod assist with basic self care tasks. He demonstrated high level cognitive deficits impacting complex problem solving, selective attention and decreased awareness of deficits.  He required mod to max assist for simple cognitive tasks.   He  has had improvement in activity tolerance, balance, postural control  as well as ability to compensate for deficits.  He is able to complete ADL tasks with supervision. He needs contact guard assist to get in the shower. He requires supervision for transfers and min assist to ambulate 40' with [facility].  He is able to locate items in left field with cues. He requires min to mod cognitive assistance t for organization, left inattention, selective attention and safety.  Family education was completed with wife and children regarding all aspects of safety and mobility.   Discharge disposition: 01-Home or Self Care  Diet: Heart Healthy/Carb modified.   Special Instructions: 1. No driving till cleared by neurology or Dr. Letta Pate. 2. Check blood sugars ac/hs and follow up with Dr. Dwyane Dee for further adjustment of medications.   Discharge Instructions    Ambulatory referral to Physical Medicine Rehab   Complete by:  As directed    1-2 weeks transitional care appt   Ambulatory referral to Vascular Surgery   Complete by:  As directed    Left CAS. Recent stroke     Allergies as of 08/09/2017   No Active Allergies     Medication List    STOP taking these medications   amlodipine-benazepril 2.5-10 MG capsule Commonly known as:  LOTREL   TRESIBA FLEXTOUCH 100 UNIT/ML Sopn FlexTouch Pen Generic drug:  insulin degludec     TAKE these medications   acetaminophen 325 MG tablet Commonly known as:  TYLENOL Take 1-2 tablets (325-650 mg total) by mouth every 4 (four) hours as needed for mild pain.   amLODipine 2.5 MG tablet Commonly known as:  NORVASC Take 1 tablet (2.5 mg total) by mouth daily.   aspirin 325 MG EC tablet Take 1 tablet (325 mg total) by mouth daily.   benazepril 5 MG tablet Commonly known as:  LOTENSIN Take 1 tablet (5 mg total) by mouth daily.   clopidogrel 75 MG tablet Commonly known as:  PLAVIX Take 1 tablet (75 mg total) by mouth daily.   ezetimibe 10 MG tablet Commonly known as:  ZETIA Take 1 tablet (10 mg total) by mouth daily.    glucose blood test strip Commonly known as:  ONETOUCH VERIO Use to check blood sugar 2 times per day by rotation of meals. Dx code: E11.9   insulin aspart 100 UNIT/ML FlexPen Commonly known as:  NOVOLOG FLEXPEN 5-7 units at main meal Notes to patient:  Take as directed by Dr. Dwyane Dee   Insulin Glargine 100 UNIT/ML Solostar Pen Commonly known as:  LANTUS SOLOSTAR Inject 20 Units into the skin daily with breakfast.   iron polysaccharides 150 MG capsule Commonly known as:  NIFEREX Take 1 capsule (150 mg total) by mouth daily.   liraglutide 18 MG/3ML Sopn Commonly known as:  VICTOZA INJECT 0.3 MLS  (1.8 MG TOTAL) INTO THE SKIN ONCE A DAY   metoprolol succinate 50 MG 24 hr tablet Commonly known as:  TOPROL-XL Take 1 tablet (50 mg total) by mouth daily after breakfast. What changed:    medication strength  how much to take  when to take this   RELION PEN NEEDLES 32G X 4 MM  Misc Generic drug:  Insulin Pen Needle USE ONE PEN NEEDLE TWICE DAILY   rosuvastatin 40 MG tablet Commonly known as:  CRESTOR Take 1 tablet (40 mg total) by mouth daily.      Follow-up Information    Kirsteins, Luanna Salk, MD Follow up.   Specialty:  Physical Medicine and Rehabilitation Why:  office will call you with follow up appointment Contact information: [address] [place] [address] [phone]        Garvin Fila, MD. Call.   Specialties:  Neurology, Radiology Why:  for follow up in 4-6 weeks Contact information: [address] [place] [address] [phone]        Lawerance Cruel, MD Follow up on 08/19/2017.   Specialty:  Family Medicine Why:  Appointment @12 :30 PM Contact information: [place] [address] [phone]        Rosetta Posner, MD Follow up on 09/16/2017.   Specialties:  Vascular Surgery, Cardiology Why:  Appointment at 3:15/discuss carotid stenosis.  Contact information: [address] [place]  [place] [address] [phone]           Signed: Bary Leriche 08/09/2017, 9:11 AM

## 2017-08-09 NOTE — Progress Notes (Signed)
Hypoglycemic Event  CBG: 62  Treatment: orange juice  Symptoms: none  Follow-up CBG: [id number] CBG Result:72  Possible Reasons for Event: medications  Comments/MD notified:Dr. Asa Lente; Patient ok to go home    Eric Long

## 2017-08-11 ENCOUNTER — Telehealth: Payer: Self-pay | Admitting: Registered Nurse

## 2017-08-11 NOTE — Telephone Encounter (Signed)
Transitional Care call Transitional Care Call Completed, Appointment Confirmed, Address Confirmed, New Patient Packet Mailed  Patient name: Eric Long DOB: 1940-08-02 1. Are you/is patient experiencing any problems since coming home? No a. Are there any questions regarding any aspect of care? No 2. Are there any questions regarding medications administration/dosing? No a. Are meds being taken as prescribed? Yes, except Lantus, Insurance wouldn't cover the Lantus. He's taking his [place]. Dr. Dwyane Dee Following. Per Mr. Michele Rockers (son).  b. "Patient should review meds with caller to confirm"  Medication List Reviewed 3. Have there been any falls? No 4. Has Home Health been to the house and/or have they contacted you? He will be going to Outpatient Rehabilitation on 08/12/2016.  a. If not, have you tried to contact them? NA b. Can we help you contact them? NA 5. Are bowels and bladder emptying properly? Yes a. Are there any unexpected incontinence issues? No b. If applicable, is patient following bowel/bladder programs? NA 6. Any fevers, problems with breathing, unexpected pain? No 7. Are there any skin problems or new areas of breakdown? No 8. Has the patient/family member arranged specialty MD follow up (ie cardiology/neurology/renal/surgical/etc.)?  All appointments are scheduled.  a. Can we help arrange? NA 9. Does the patient need any other services or support that we can help arrange? No 10. Are caregivers following through as expected in assisting the patient? Yes 11. Has the patient quit smoking, drinking alcohol, or using drugs as recommended? Mr. Soulier denies smoking, drinking alcohol or using illicit drugs.   Appointment date/time 08/18/2017, arrival time 12:45 appointment time 1:15 with Dr. Letta Pate. At [place]

## 2017-08-12 ENCOUNTER — Encounter: Payer: Self-pay | Admitting: Occupational Therapy

## 2017-08-12 ENCOUNTER — Other Ambulatory Visit: Payer: Self-pay

## 2017-08-12 ENCOUNTER — Ambulatory Visit: Payer: Medicare HMO | Admitting: Speech Pathology

## 2017-08-12 ENCOUNTER — Ambulatory Visit: Payer: Medicare HMO

## 2017-08-12 ENCOUNTER — Ambulatory Visit: Payer: Medicare HMO | Attending: Physical Medicine & Rehabilitation | Admitting: Occupational Therapy

## 2017-08-12 DIAGNOSIS — R2689 Other abnormalities of gait and mobility: Secondary | ICD-10-CM | POA: Diagnosis not present

## 2017-08-12 DIAGNOSIS — I69354 Hemiplegia and hemiparesis following cerebral infarction affecting left non-dominant side: Secondary | ICD-10-CM | POA: Diagnosis not present

## 2017-08-12 DIAGNOSIS — I69318 Other symptoms and signs involving cognitive functions following cerebral infarction: Secondary | ICD-10-CM | POA: Diagnosis not present

## 2017-08-12 DIAGNOSIS — R29818 Other symptoms and signs involving the nervous system: Secondary | ICD-10-CM | POA: Diagnosis not present

## 2017-08-12 DIAGNOSIS — R208 Other disturbances of skin sensation: Secondary | ICD-10-CM | POA: Insufficient documentation

## 2017-08-12 DIAGNOSIS — R41841 Cognitive communication deficit: Secondary | ICD-10-CM | POA: Insufficient documentation

## 2017-08-12 DIAGNOSIS — R278 Other lack of coordination: Secondary | ICD-10-CM

## 2017-08-12 DIAGNOSIS — R414 Neurologic neglect syndrome: Secondary | ICD-10-CM | POA: Diagnosis not present

## 2017-08-12 DIAGNOSIS — R2681 Unsteadiness on feet: Secondary | ICD-10-CM | POA: Insufficient documentation

## 2017-08-12 DIAGNOSIS — R41842 Visuospatial deficit: Secondary | ICD-10-CM

## 2017-08-12 DIAGNOSIS — R482 Apraxia: Secondary | ICD-10-CM

## 2017-08-12 NOTE — Therapy (Signed)
[facility] [address] [place], [place], [address] Phone: [phone]   Fax:  [phone]  Physical Therapy Evaluation  Patient Details  Name: Eric Long MRN: [id number] Date of Birth: 07/16/1940 Referring Provider: Dr. Letta Pate   Encounter Date: 08/12/2017  PT End of Session - 08/12/17 1153    Visit Number  1    Number of Visits  17    Date for PT Re-Evaluation  10/11/17    Authorization Type  [employer]    PT Start Time  1104    PT Stop Time  1144    PT Time Calculation (min)  40 min    Equipment Utilized During Treatment  Gait belt    Activity Tolerance  Patient tolerated treatment well    Behavior During Therapy  [facility] for tasks assessed/performed;Flat affect       Past Medical History:  Diagnosis Date  . Diabetes mellitus without complication ([place])   . Hyperlipidemia     Past Surgical History:  Procedure Laterality Date  . FEMORAL-FEMORAL BYPASS GRAFT Bilateral 2007    There were no vitals filed for this visit.   Subjective Assessment - 08/12/17 1110    Subjective  Pt presented in w/c. Pt presents to PT s/p CVA and reports L knee buckles during gait and he sways. Pt reports difficulty placing LLE during gait and states LLE feels heavy. Pt reported 2 falls in the last 6 months. Pt amb. approx. 38' with SBQC, with S, when at home.     Patient is accompained by:  Family member wife, dtr, and son    Pertinent History  DM, HLD, PVD, CKD (stage III)    Patient Stated Goals  Return to IND living, walk without concern, build up stamina     Currently in Pain?  No/denies         [facility] PT Assessment - 08/12/17 1114      Assessment   Medical Diagnosis  R MCA CVA    Referring Provider  Dr. Letta Pate    Onset Date/Surgical Date  07/21/17    Hand Dominance  Right    Prior Therapy  Pt had brief inpt rehab stay PT, OT and ST      Precautions   Precautions  Fall L neglect and cognitive deficits       Restrictions   Weight Bearing Restrictions  No      Balance Screen   Has the patient fallen in the past 6 months  Yes    How many times?  2    Has the patient had a decrease in activity level because of a fear of falling?   Yes    Is the patient reluctant to leave their home because of a fear of falling?   Yes      [place]  Private residence    Living Arrangements  Spouse/significant other    Available Help at Discharge  Family    Type of [place] to enter    Entrance Stairs-Number of Steps  3    Entrance Stairs-Rails  -- being [place]  Two level;Able to live on main level with bedroom/bathroom    Alternate Level Stairs-Number of Steps  12 pt doesn't go downstairs    Alternate Level Stairs-Rails  Right it's a lower level (handrail on R going down)    Home Equipment  Cane - quad;Wheelchair - manual;Shower seat;Grab bars - tub/shower;Toilet riser;Other (comment) toilet riser with bars      Prior Function   Level of Independence  Independent    Vocation  Retired    Leisure  write non fiction, history, watch documentaries, range shooting       Cognition   Overall Cognitive Status  Impaired/Different from baseline see OT notes      Sensation   Light Touch  Impaired by gross assessment    Additional Comments  LLE light touch decr. vs. RLE.      Coordination   Gross Motor Movements are Fluid and Coordinated  No    Fine Motor Movements are Fluid and Coordinated  No      Posture/Postural Control   Posture/Postural Control  Postural limitations    Postural Limitations  Rounded Shoulders;Posterior pelvic tilt pelvic tilt in seated position      Tone   Assessment Location  Left Lower Extremity;Right Lower Extremity      ROM / Strength   AROM / PROM / Strength  AROM;Strength      AROM   Overall AROM   Deficits    Overall AROM Comments  BLE ROM WNL except for decr. L DF (about 5 degrees)      Strength    Overall Strength  Deficits    Overall Strength Comments  RLE: 5/5. LLE: hip flex: 4/5, knee ext: 4/5, knee flex: 4/5, ankle DF: 2/5. B seated hip abd/add: 4/5.      Transfers   Transfers  Sit to Stand;Stand to Sit    Sit to Stand  5: Supervision;With upper extremity assist;From chair/3-in-1    Sit to Stand Details  Tactile cues for sequencing;Verbal cues for sequencing;Verbal cues for technique;Verbal cues for precautions/safety    Stand to Sit  5: Supervision;With upper extremity assist;To chair/3-in-1    Stand to Sit Details (indicate cue type and reason)  Tactile cues for sequencing;Tactile cues for initiation;Verbal cues for sequencing;Verbal cues for technique;Verbal cues for precautions/safety      Ambulation/Gait   Ambulation/Gait  Yes    Ambulation/Gait Assistance  4: Min guard;4: Min assist    Ambulation/Gait Assistance Details  Min A during turns 2/2 incr. sway and narrow BOS. Pt noted to amb. with incr. L foot ER, pt's family stated this was an issue prior to CVA but has become more pronounced. Pt was able to correct for several steps with cues and demo.     Ambulation Distance (Feet)  75 Feet    Assistive device  Small based quad cane    Gait Pattern  Step-through pattern;Decreased stride length;Decreased dorsiflexion - left;Decreased weight shift to left;Decreased trunk rotation L foot ER    Ambulation Surface  Level;Indoor    Gait velocity  0.101ft/sec.       Standardized Balance Assessment   Standardized Balance Assessment  Timed Up and Go Test      Timed Up and Go Test   TUG  Normal TUG    Normal TUG (seconds)  41.74 with SBQC and sequencing cues to perform sit to stand      RLE Tone   RLE Tone  Within Functional Limits      LLE Tone   LLE Tone  Modified Ashworth difficult to assess if pt is limited by tone or flexibility      LLE Tone   Modified Ashworth Scale for Grading Hypertonia LLE  Slight increase in muscle tone, manifested by a catch and release  or by minimal  resistance at the end of the range of motion when the affected part(s) is moved in flexion or extension                Objective measurements completed on examination: See above findings.              PT Education - 08/12/17 1152    Education provided  Yes    Education Details  PT discussed outcome measure, PT POC, duration, and frequency. PT educated pt and family on the importance of using AD at all times, as pt's son stated pt will hold furniture and walls at home.     Person(s) Educated  Patient;Spouse;Child(ren)    Methods  Explanation;Verbal cues    Comprehension  Verbalized understanding       PT Short Term Goals - 08/12/17 1201      PT SHORT TERM GOAL #1   Title  Pt will perform HEP with S to improve balance, endurance, strength, and safety. TARGET DATE FOR ALL STGS: 09/09/17    Status  New      PT SHORT TERM GOAL #2   Title  Pt will perform TUG with LRAD, with S,  in </=25 sec. to decr. falls risk    Status  New      PT SHORT TERM GOAL #3   Title  Pt will improve gait speed with LRAD to >/=1.25ft/sec. to decr. falls risk.     Status  New      PT SHORT TERM GOAL #4   Title  Perform BERG and write STG and LTG as indicated.     Status  New      PT SHORT TERM GOAL #5   Title  Pt will amb. 300' over even terrain with LRAD and S to improve safety during functional mobility.     Status  New        PT Long Term Goals - 08/12/17 1212      PT LONG TERM GOAL #1   Title  Pt will verbalize understanding of CVA risk factors and s/s to decr. risk of CVA. (TARGET DATE FOR ALL LTGS: 10/07/17)    Status  New      PT LONG TERM GOAL #2   Title  Pt will be IND with HEP to improve strength, safety, balance, and endurance.     Status  New      PT LONG TERM GOAL #3   Title  Pt will perform TUG with LRAD, at MOD I level, in </=13.5 sec. to decr. falls risk.     Status  New      PT LONG TERM GOAL #4   Title  Pt will improve gait speed to >/=2.77ft/sec. with LRAD  to amb. safely in the community.     Status  New      PT LONG TERM GOAL #5   Title  Pt will amb. 600' over uneven terrain with LRAD at MOD I level to improve functional mobility and safety.     Status  New             Plan - 08/12/17 1154    Clinical Impression Statement  Pt is a pleasant 77y/o male presenting to OPPT neuro s/p R MCA CVA on 07/21/17. Pt's PMH significant for the following: DM, HLD, PVD, CKD (stage III). The following deficits were noted upon exam: gait devaitions, impaired LLE strength, impaired coordination, decr. L ankle DF ROM, decr. flexibility,  impaired cognition, impaired sensation, L neglect, pt also reported dizziness during gait (will further assess) and safety awareness. Pt's gait speed and TUG time both indicate pt is at a high risk for falls. PT will formally assess balance with BERG balance test next session, as today was limited 2/2 time constraints. Pt would benefit from skilled PT to improve safety during functional mobilty.     History and Personal Factors relevant to plan of care:  pt enjoys writing, going to the shooting range, is retired and enjoys time with family    Clinical Presentation  Evolving    Clinical Presentation due to:  DM, HLD, PVD, CKD (stage III)    Clinical Decision Making  Moderate    Rehab Potential  Good    Clinical Impairments Affecting Rehab Potential  see above.     PT Frequency  2x / week    PT Duration  8 weeks    PT Treatment/Interventions  ADLs/Self Care Home Management;Biofeedback;Canalith Repostioning;Electrical Stimulation;Therapeutic exercise;Therapeutic activities;Manual techniques;Wheelchair mobility training;Vestibular;Functional mobility training;Orthotic Fit/Training;Stair training;Gait training;Patient/family education;Cognitive remediation;DME Instruction;Neuromuscular re-education;Balance training    PT Next Visit Plan  Perform BERG and write goal. Establish balance and functional strength HEP    Consulted and Agree  with Plan of Care  Patient;Family member/caregiver    Family Member Consulted  pt's wife ([name]?), [name], [name]: dtr       Patient will benefit from skilled therapeutic intervention in order to improve the following deficits and impairments:  Abnormal gait, Decreased endurance, Impaired sensation, Postural dysfunction, Impaired flexibility, Impaired tone, Decreased coordination, Decreased safety awareness, Decreased cognition, Decreased range of motion, Dizziness, Decreased balance, Decreased mobility, Decreased activity tolerance, Decreased knowledge of use of DME, Decreased strength, Impaired UE functional use  Visit Diagnosis: Hemiplegia and hemiparesis following cerebral infarction affecting left non-dominant side (HCC) - Plan: PT plan of care cert/re-cert  Other disturbances of skin sensation - Plan: PT plan of care cert/re-cert  Other lack of coordination - Plan: PT plan of care cert/re-cert  Other abnormalities of gait and mobility - Plan: PT plan of care cert/re-cert     Problem List Patient Active Problem List   Diagnosis Date Noted  . Acute ischemic right MCA stroke ([place]) 07/26/2017  . Hyperlipidemia 07/23/2017  . Diastolic dysfunction   . Acute blood loss anemia   . Essential hypertension 07/22/2017  . Type 2 diabetes mellitus with renal manifestations ([place]) 07/22/2017  . Acute CVA (cerebrovascular accident) ([place]) 07/21/2017  . Chronic kidney disease, stage III (moderate) ([place]) 02/19/2013  . Atherosclerotic peripheral vascular disease ([place]) 02/19/2013  . Pure hypercholesterolemia 02/19/2013  . Diabetes mellitus, stable ([place]) 02/05/2013    Dorse Locy L 08/12/2017, 12:17 PM  [facility] [address] [place], [place], [address] Phone: [phone]   Fax:  [phone]  Name: Eric Long MRN: [id number] Date of Birth: [birth year]   Geoffry Paradise, PT,DPT 08/12/17 12:18 PM Phone:  [phone] Fax: [phone]

## 2017-08-12 NOTE — Patient Instructions (Addendum)
   Cognitive Activities you can do at home:   - [place] (easy level)  - [place]  On your computer, tablet or phone: [place] [employer] Spot the difference games  Simple mazes    Tips to help facilitate better attention, concentration, focus   Do harder, longer tasks when you are most alert/awake  Break down larger tasks into small parts  Limit distractions of TV, radio, conversation, e mails/texts, appliance noise, etc - if a job is important, do it in a quiet room  Be aware of how you are functioning in high stimulation environments such as large stores, parties, restaurants - any place with lots of lights, noise, signs etc  Group conversations may be more difficult to process than one on one conversations  Give yourself extra time to process conversation, reading materials, directions or information from your healthcare providers  Organization is key - clutters of laundry, mail, paperwork, dirty dishes - all make it more difficult to concentrate  Before you start a task, have all the needed supplies, directions, recipes ready and organized. This way you don't have to go looking for something in the middle of a task and become distracted.   Be aware of fatigue - take rests or breaks when needed to re-group and re-focus

## 2017-08-12 NOTE — Therapy (Signed)
[facility] [address] [place], [place], [address] Phone: [phone]   Fax:  [phone]  Occupational Therapy Evaluation  Patient Details  Name: Eric Long MRN: [id number] Date of Birth: 1941-04-08 Referring Provider: Dr. Letta Pate   Encounter Date: 08/12/2017  OT End of Session - 08/12/17 1427    Visit Number  1    Number of Visits  16    Date for OT Re-Evaluation  10/07/17    Authorization Type  medicare    Authorization Time Period  90 days    OT Start Time  0931    OT Stop Time  1015    OT Time Calculation (min)  44 min    Activity Tolerance  Patient tolerated treatment well       Past Medical History:  Diagnosis Date  . Diabetes mellitus without complication ([place])   . Hyperlipidemia     Past Surgical History:  Procedure Laterality Date  . FEMORAL-FEMORAL BYPASS GRAFT Bilateral 2007    There were no vitals filed for this visit.  Subjective Assessment - 08/12/17 0938    Subjective   I am having trouble seeing things on my left    Patient is accompained by:  Family member wife [name], dtr and son    Pertinent History  R MCA CVA, acute encephalopathy, severe L carotid stenosis.      Patient Stated Goals  back to where I am stable with walking, use the computer, continue reading and writing.     Currently in Pain?  No/denies        [facility] OT Assessment - 08/12/17 0940      Assessment   Medical Diagnosis  R MCA CVA    Referring Provider  Dr. Alysia Penna    Onset Date/Surgical Date  07/21/17    Hand Dominance  Right    Prior Therapy  Pt had brief inpt rehab stay PT, OT and ST      Precautions   Precautions  Fall L neglect, cognitive deficits      Restrictions   Weight Bearing Restrictions  No      Balance Screen   Has the patient fallen in the past 6 months  Yes pt had three falls since stroke PT eval today    Has the patient had a decrease in activity level because of a fear of  falling?   Yes    Is the patient reluctant to leave their home because of a fear of falling?   Yes      Home  Environment   Family/patient expects to be discharged to:  Private residence    Living Arrangements  Spouse/significant other    Available Help at Discharge  Available 24 hours/day    Type of [place]  Two level pt and wife live primarily on first floor and did before CVA    Bathroom Shower/Tub  Public librarian    Additional Comments  Pt has transfer tub bench but family bought shower seat.  Pt has 3 in1 that he uses over the toilet.  grab bars in the shower       Prior Function   Level of Independence  Independent    Vocation  Retired    Leisure  write non fiction, history, watch documentaries      ADL   Eating/Feeding  Minimal assistance drops things off silverware, dribbles from mouth  Grooming  Modified independent    Upper Body Bathing  Supervision/safety    Lower Body Bathing  Supervision/safety    Upper Body Dressing  Minimal assistance    Lower Body Dressing  Moderate assistance    Toilet Transfer  Minimal assistance ambulatory with cane    Toileting - Clothing Manipulation  Moderate assistance    Toileting -  Control and instrumentation engineer  Supervision/safety      IADL   Shopping  Needs to be accompanied on any shopping trip    Light Housekeeping  -- wife did before pt took out garbage    Meal Prep  Needs to have meals prepared and served    [employer] on family or friends for transportation    Medication Management  Is not capable of dispensing or managing own medication    Financial Management  Requires assistance      Mobility   Mobility Status  Needs assist      Written Expression   Dominant Hand  Right    Handwriting  100% legible      Vision - History   Baseline Vision  Wears glasses all the time    Additional Comments  I don't see or pay attention to things on my left.   I have to make sure that what I am seeing is real - sometimes I see something and I dont' recognize the shape.       Activity Tolerance   Activity Tolerance  Tolerate 30+ min activity without fatigue      Cognition   Attention  Selective    Selective Attention  Impaired    Memory  Impaired    Problem Solving  Impaired    Executive Function  -- impaired will need further assessment      Posture/Postural Control   Posture/Postural Control  Postural limitations    Postural Limitations  Rounded Shoulders;Posterior pelvic tilt      Sensation   Light Touch  Impaired by gross assessment    Hot/Cold  Impaired by gross assessment    Proprioception  Appears Intact    Additional Comments  impaired kinesthetic sense      Coordination   Gross Motor Movements are Fluid and Coordinated  No mild incoordination in LUE    Fine Motor Movements are Fluid and Coordinated  No    Finger Nose Finger Test  WFL's      Perception   Perception  Impaired    Inattention/Neglect  -- L neglect      Praxis   Praxis  Impaired      Tone   Assessment Location  Left Upper Extremity      ROM / Strength   AROM / PROM / Strength  AROM;Strength      AROM   Overall AROM   Within functional limits for tasks performed    Overall AROM Comments  BUE's      Strength   Overall Strength  Within functional limits for tasks performed Pt with ulnar drift with eyes closed due to sensation/neglec      Hand Function   Right Hand Gross Grasp  Functional    Right Hand Grip (lbs)  75    Left Hand Gross Grasp  Functional    Left Hand Grip (lbs)  70      LUE Tone   LUE Tone  Within Functional Limits  OT Short Term Goals - 08/12/17 1305      OT SHORT TERM GOAL #1   Title  Pt and family will be mod I for home activities program for L field cut/L neglect - 09/09/2017    Status  New      OT SHORT TERM GOAL #2   Title  Assessed 9 hole peg and determine goal if appropriate.     Status  New      OT SHORT TERM GOAL #3   Title  Assess Box and blocks and determine goal if appropriate    Status  New      OT SHORT TERM GOAL #4   Title  Pt will be supevision for UB dressing     Status  New      OT SHORT TERM GOAL #5   Title  Pt will need no more than 3 vc's for clothing orientation when dressing.    Status  New      OT SHORT TERM GOAL #6   Title  Pt will be min a, mod cues for LB dressing    Status  New      OT SHORT TERM GOAL #7   Title  Pt will locate items during functional tasks on L with no more than min cues     Status  New        OT Long Term Goals - 08/12/17 1309      OT LONG TERM GOAL #1   Title  Pt and family will be mod I upgraded HEP prn - 10/07/2017    Status  New      OT LONG TERM GOAL #2   Title  Pt will demonstrate envrionmental scanning during functional task with at least 90% accuracy    Status  New      OT LONG TERM GOAL #3   Title  Pt will be able to complete bilateral tasks with no more than min cues to attend to L hand    Status  New      OT LONG TERM GOAL #4   Title  Pt will be mod I with eating    Status  New      OT LONG TERM GOAL #5   Title  Pt will be supervision for simple yard tasks.    Status  New      OT LONG TERM GOAL #6   Title  Pt will be mod I with dressing      OT LONG TERM GOAL #7   Title  Pt will be mod I with toilet and shower transfers    Status  New            Plan - 08/12/17 1417    Clinical Impression Statement  Pt is a 77 year male s/p R MCA CVS on 07/21/2017 as well as acute encephalopathy.  Pt with in pt rehab stay and d/c home on 08/09/2017.  Pt presents today with the following deficits that impact his independence in all life roles:  L neglect, L field cut, decreased balance, impaired sensation LUE, decreased activity tolerance, apraxia, decreased functional use of L hand, impaired perception, impaired cognition including decreased attention, memory, problem solving and anticipatory  awareness.  Pt will benefit from skilled OT to address these deficits and maximize independence.     Occupational Profile and client history currently impacting functional performance  PMH:  DM, HTN, HLD, chronic kidney disease, PVD, severe L extracranial carotid stenosis.  Occupational performance deficits (Please refer to evaluation for details):  ADL's;IADL's;Rest and Sleep;Leisure;Social Participation    Rehab Potential  Good    OT Frequency  2x / week    OT Duration  8 weeks    OT Treatment/Interventions  Self-care/ADL training;Aquatic Therapy;Electrical Stimulation;DME and/or AE instruction;Neuromuscular education;Therapeutic exercise;Functional Mobility Training;Manual Therapy;Therapeutic activities;Cognitive remediation/compensation;Balance training;Patient/family education;Visual/perceptual remediation/compensation    Plan  9 hole peg, box and blocks, assess motor planning further with familiar functional tasks, enviornmental and table top scanning    Clinical Decision Making  Multiple treatment options, significant modification of task necessary    Consulted and Agree with Plan of Care  Patient;Family member/caregiver    Family Member Consulted  wife, son and dtr       Patient will benefit from skilled therapeutic intervention in order to improve the following deficits and impairments:  Abnormal gait, Decreased activity tolerance, Decreased balance, Decreased coordination, Decreased cognition, Decreased knowledge of use of DME, Decreased mobility, Decreased safety awareness, Difficulty walking, Impaired UE functional use, Impaired sensation, Impaired vision/preception  Visit Diagnosis: Apraxia - Plan: Ot plan of care cert/re-cert  Left-sided neglect - Plan: Ot plan of care cert/re-cert  Other lack of coordination - Plan: Ot plan of care cert/re-cert  Other symptoms and signs involving cognitive functions following cerebral infarction - Plan: Ot plan of care cert/re-cert  Other  symptoms and signs involving the nervous system - Plan: Ot plan of care cert/re-cert  Unsteadiness on feet - Plan: Ot plan of care cert/re-cert  Visuospatial deficit - Plan: Ot plan of care cert/re-cert    Problem List Patient Active Problem List   Diagnosis Date Noted  . Acute ischemic right MCA stroke ([place]) 07/26/2017  . Hyperlipidemia 07/23/2017  . Diastolic dysfunction   . Acute blood loss anemia   . Essential hypertension 07/22/2017  . Type 2 diabetes mellitus with renal manifestations ([place]) 07/22/2017  . Acute CVA (cerebrovascular accident) ([place]) 07/21/2017  . Chronic kidney disease, stage III (moderate) ([place]) 02/19/2013  . Atherosclerotic peripheral vascular disease ([place]) 02/19/2013  . Pure hypercholesterolemia 02/19/2013  . Diabetes mellitus, stable ([place]) 02/05/2013    Quay Burow, OTR/L 08/12/2017, 2:31 PM  [facility] [address] [place], [place], [address] Phone: [phone]   Fax:  [phone]  Name: Eric Long MRN: [id number] Date of Birth: Dec 16, 1940

## 2017-08-13 NOTE — Therapy (Signed)
[facility] [address] [place], [place], [address] Phone: [phone]   Fax:  [phone]  Speech Language Pathology Evaluation  Patient Details  Name: Eric Long MRN: [id number] Date of Birth: Jul 12, 1940 Referring Provider: Dr. Alysia Penna   Encounter Date: 08/12/2017  End of Session - 08/13/17 1415    Visit Number  1    Number of Visits  17    Date for SLP Re-Evaluation  10/10/17    Authorization Type  60 total visits - if PT/OT/ST are on 1 day, it counts as 1 visit       Past Medical History:  Diagnosis Date  . Diabetes mellitus without complication ([place])   . Hyperlipidemia     Past Surgical History:  Procedure Laterality Date  . FEMORAL-FEMORAL BYPASS GRAFT Bilateral 2007    There were no vitals filed for this visit.  Subjective Assessment - 08/12/17 0845    Subjective  "according to my speech therapist I don't know the consequences of my actoins"    Patient is accompained by:  Family member son Herbie [place] and wife [name]    Currently in Pain?  No/denies         SLP Evaluation [facility] - 08/13/17 1414      SLP Visit Information   SLP Received On  08/12/17    Referring Provider  Dr. Alysia Penna    Onset Date  07/21/17    Medical Diagnosis  Right CVA      Subjective   Patient/Family Stated Goal  "To recover the ability to be indpendent"      General Information   HPI  Pt is a 77 year male s/p R MCA CVS on 07/21/2017 as well as acute encephalopathy.  Pt with in pt rehab stay and d/c home on 08/09/2017.        Balance Screen   Has the patient fallen in the past 6 months  Yes when CVA occured    How many times?  1    Has the patient had a decrease in activity level because of a fear of falling?   Yes    Is the patient reluctant to leave their home because of a fear of falling?   Yes      Prior Functional Status   Cognitive/Linguistic Baseline  Within functional limits    Type of Home   House     Lives With  Spouse    Available Support  Family    Research officer, political party    Vocation  Retired      Associate Professor   Overall Cognitive Status  Impaired/Different from baseline    Area of Impairment  Attention;Orientation;Memory;Safety/judgement;Awareness    Orientation Level  -- date,     Current Attention Level  Sustained    Attention  Selective    Selective Attention  Impaired    Selective Attention Impairment  Verbal basic;Functional basic    Memory  Impaired    Memory Impairment  Decreased recall of new information    Awareness  Impaired    Awareness Impairment  Emergent impairment    Problem Solving  Impaired    Problem Solving Impairment  Verbal basic;Functional basic    Executive Function  Sequencing;Organizing;Decision Making;Self Monitoring;Self Correcting;Reasoning    Reasoning  Impaired    Sequencing  Impaired    Self Monitoring  Impaired    Self Correcting  Impaired      Auditory Comprehension   Overall Auditory Comprehension  Appears  within functional limits for tasks assessed      Visual Recognition/Discrimination   Discrimination  Exceptions to [facility] left field cut, left neglect      Reading Comprehension   Reading Status  Within funtional limits      Expression   Primary Mode of Expression  Verbal      Verbal Expression   Overall Verbal Expression  Appears within functional limits for tasks assessed      Written Expression   Dominant Hand  Right    Written Expression  Within Functional Limits      Oral Motor/Sensory Function   Overall Oral Motor/Sensory Function  Appears within functional limits for tasks assessed      Motor Speech   Overall Motor Speech  Appears within functional limits for tasks assessed      Standardized Assessments   Standardized Assessments   Cognitive Linguistic Quick Test      Cognitive Linguistic Quick Test (Ages 18-69)   Attention  Moderate    Memory  WNL    Executive Function  Moderate    Language  WNL     Visuospatial Skills  Mild    Severity Rating Total  15    Composite Severity Rating  12.6                      SLP Education - 08/12/17 1958    Education provided  Yes    Education Details  compensations for attention, cognitive activities to do at home       SLP Short Term Goals - 08/13/17 1416      SLP SHORT TERM GOAL #1   Title  Pt will selectively attend to mildly complex cognitive linguistic task with distractions and occasional min redirection    Time  4    Period  Weeks    Status  New      SLP SHORT TERM GOAL #2   Title  Pt will complete milldy complex organization, reasoning, problem sovling tasks with 85% accuracy and occasional min A    Time  4    Period  Weeks    Status  New      SLP SHORT TERM GOAL #3   Title  Pt will ID errors and self correct 3/5 errors with occasional min A    Time  4    Period  Weeks    Status  New      SLP SHORT TERM GOAL #4   Title  Pt will utilize external aids for medication, schedule management with occasional min A from family outside of therapy over 2 sessions    Time  4    Period  Weeks    Status  New       SLP Long Term Goals - 08/13/17 1420      SLP LONG TERM GOAL #1   Title  Pt will alternate attention between 2 mildly complex cognitive linguistic tasksk with 85% on each and occasional min A    Time  8    Period  Weeks    Status  New      SLP LONG TERM GOAL #2   Title  Pt will self correct errors 4/5 errors with occasional min A    Time  8    Period  Weeks    Status  New      SLP LONG TERM GOAL #3   Title  Pt will recall details from paragraph, attending to the  left, with 80% accuracy and occasional min A    Time  8    Period  Weeks    Status  New       Plan - 08/13/17 1415    Clinical Impression Statement  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 He demonstrates difficulty selectively attending to  simple check writng task when conversation in interjected. Checks were not filled out completely, and when I pointed this out, Mr. Stefanski was not able to make the corrections, indicating reduced problem solving. He did not balance the check with simple amounts, and did not follow the written directions, indicating reduced attention to details. Mr. Craine asked "Why can't I remember what I just read." I believe this to be processing/attention rather than memory. Mr. Seibold recalled the date, he recalled attention compensations from inpt rehab. His memory score on the CLQT was WNL. It should be noted that pt has left visual field cut and left neglect. Compensations for this were implemented during the CLQT, but field cut could affect portions of CLQT. Prior to this CVA, Mr. Mccauley was independent in ADL's, finances and medication mangagement. I  recommend skilled ST to maximize cognition for safety, independnce, and to reduce caregiver burden.     Speech Therapy Frequency  2x / week for 8 weeks or 17 visits    Treatment/Interventions  Environmental controls;Cueing hierarchy;SLP instruction and feedback;Cognitive reorganization;Functional tasks;Compensatory strategies;Internal/external aids;Patient/family education    Potential to Achieve Goals  Good    Consulted and Agree with Plan of Care  Patient;Family member/caregiver    Family Member Consulted  spouse, daughter, Dewaine Oats; son        Patient will benefit from skilled therapeutic intervention in order to improve the following deficits and impairments:   Cognitive communication deficit - Plan: SLP plan of care cert/re-cert    Problem List Patient Active Problem List   Diagnosis Date Noted  . Acute ischemic right MCA stroke ([place]) 07/26/2017  . Hyperlipidemia 07/23/2017  . Diastolic dysfunction   . Acute blood loss anemia   . Essential hypertension 07/22/2017  . Type 2 diabetes mellitus with renal manifestations ([place]) 07/22/2017  . Acute CVA  (cerebrovascular accident) ([place]) 07/21/2017  . Chronic kidney disease, stage III (moderate) ([place]) 02/19/2013  . Atherosclerotic peripheral vascular disease ([place]) 02/19/2013  . Pure hypercholesterolemia 02/19/2013  . Diabetes mellitus, stable ([place]) 02/05/2013    Lovvorn, Annye Rusk MS, CCC-SLP 08/13/2017, 2:24 PM  [facility] [address] [place], [place], [address] Phone: [phone]   Fax:  [phone]  Name: ASIR BINGLEY MRN: [id number] Date of Birth: Nov 03, 1940

## 2017-08-18 ENCOUNTER — Ambulatory Visit: Payer: Medicare HMO | Admitting: Physical Therapy

## 2017-08-18 ENCOUNTER — Encounter: Payer: Medicare HMO | Attending: Physical Medicine & Rehabilitation

## 2017-08-18 ENCOUNTER — Encounter: Payer: Self-pay | Admitting: Physical Medicine & Rehabilitation

## 2017-08-18 ENCOUNTER — Ambulatory Visit (HOSPITAL_BASED_OUTPATIENT_CLINIC_OR_DEPARTMENT_OTHER): Payer: Medicare HMO | Admitting: Physical Medicine & Rehabilitation

## 2017-08-18 VITALS — BP 118/74 | HR 82 | Ht 70.5 in | Wt 241.0 lb

## 2017-08-18 DIAGNOSIS — E119 Type 2 diabetes mellitus without complications: Secondary | ICD-10-CM | POA: Insufficient documentation

## 2017-08-18 DIAGNOSIS — Z95828 Presence of other vascular implants and grafts: Secondary | ICD-10-CM | POA: Insufficient documentation

## 2017-08-18 DIAGNOSIS — I69398 Other sequelae of cerebral infarction: Secondary | ICD-10-CM | POA: Diagnosis not present

## 2017-08-18 DIAGNOSIS — R414 Neurologic neglect syndrome: Secondary | ICD-10-CM | POA: Diagnosis not present

## 2017-08-18 DIAGNOSIS — I69391 Dysphagia following cerebral infarction: Secondary | ICD-10-CM

## 2017-08-18 DIAGNOSIS — Z833 Family history of diabetes mellitus: Secondary | ICD-10-CM | POA: Diagnosis not present

## 2017-08-18 DIAGNOSIS — R269 Unspecified abnormalities of gait and mobility: Secondary | ICD-10-CM

## 2017-08-18 DIAGNOSIS — E785 Hyperlipidemia, unspecified: Secondary | ICD-10-CM | POA: Diagnosis not present

## 2017-08-18 DIAGNOSIS — Z87891 Personal history of nicotine dependence: Secondary | ICD-10-CM | POA: Insufficient documentation

## 2017-08-18 NOTE — Patient Instructions (Addendum)
Discuss swallow issues with speech , if they rec Modified Barium Swallow they will contact me and I can order

## 2017-08-18 NOTE — Progress Notes (Addendum)
Subjective:    Patient ID: Eric Long, male    DOB: 1940-05-30, 77 y.o.   MRN: [id number] 77 year old right handed male with history of T2DM and hyperlipidemia who was admitted on 07/22/2017 with two day history of unsteady gait and left-sided weakness.  CT of head revealed hypodensity in parietal and temporal lobe consistent with acute/subacute infarct.  CTA head showed 40-55% R-ICA stenosis 80% L- ICA stenosis and severe stenosis left vertebral artery.  MRI of brain done showing large area of acute ischemia within posterior right hemisphere involving the right MCA  And small portion of right PCA question emboli.  Dr. Leonie Man felt right MCA infarct was due to intracranial and extracranial last vessel extraocular atherosclerosis and dual antiplatelet was recommended.  He is to follow-up with vascular discharge.  Patient with resultant left visual field deficit for safety awareness balance deficits and cognitive deficits.  CIR was recommended due to functional deficits.   HPI Increased swallowing problem since d/c , no gagging or choking, no pain with swallowing.  Pt feels like he is forcing pills down No coughing or SOB, no heartburn Pt eating a little less than usual, ate roast beef sandwich.  Also notes some food preferences have changed  Bowels and bladder doing ok, good control.   Dressing and bathing Mod I  No other symptoms  Has not seen primary or endocrine MD thus far.  Was seen in physical therapy  Pain Inventory Average Pain 0 Pain Right Now 0 My pain is na  In the last 24 hours, has pain interfered with the following? General activity 0 Relation with others 0 Enjoyment of life 0 What TIME of day is your pain at its worst? na Sleep (in general) Good  Pain is worse with: na' Pain improves with: na Relief from Meds: na  Mobility walk without assistance use a cane do you drive?  no use a wheelchair  Function retired  Neuro/Psych trouble walking  Prior  Studies Any changes since last visit?  no  Physicians involved in your care Any changes since last visit?  no   Family History  Problem Relation Age of Onset  . Diabetes Sister    Social History   Socioeconomic History  . Marital status: Married    Spouse name: Not on file  . Number of children: Not on file  . Years of education: Not on file  . Highest education level: Not on file  Occupational History  . Not on file  Social Needs  . Financial resource strain: Not on file  . Food insecurity:    Worry: Not on file    Inability: Not on file  . Transportation needs:    Medical: Not on file    Non-medical: Not on file  Tobacco Use  . Smoking status: Former Research scientist (life sciences)  . Smokeless tobacco: Never Used  Substance and Sexual Activity  . Alcohol use: No    Frequency: Never  . Drug use: No  . Sexual activity: Not on file  Lifestyle  . Physical activity:    Days per week: Not on file    Minutes per session: Not on file  . Stress: Not on file  Relationships  . Social connections:    Talks on phone: Not on file    Gets together: Not on file    Attends religious service: Not on file    Active member of club or organization: Not on file    Attends meetings of clubs or organizations:  Not on file    Relationship status: Not on file  Other Topics Concern  . Not on file  Social History Narrative  . Not on file   Past Surgical History:  Procedure Laterality Date  . FEMORAL-FEMORAL BYPASS GRAFT Bilateral 2007   Past Medical History:  Diagnosis Date  . Diabetes mellitus without complication ([place])   . Hyperlipidemia    BP 118/74   Pulse 82   Ht 5' 10.5" (1.791 m)   Wt 241 lb (109.3 kg)   SpO2 97%   BMI 34.09 kg/m   Opioid Risk Score:   Fall Risk Score:  `1  Depression screen PHQ 2/9  Depression screen [place] 2/9 10/21/2016 07/11/2014  Decreased Interest 0 0  Down, Depressed, Hopeless 0 0  PHQ - 2 Score 0 0    Review of Systems  Constitutional: Negative.   HENT:  Negative.   Eyes: Negative.   Respiratory: Negative.   Cardiovascular: Negative.   Gastrointestinal: Negative.   Endocrine: Negative.   Genitourinary: Negative.   Musculoskeletal: Positive for gait problem.  Skin: Negative.   Allergic/Immunologic: Negative.   Hematological: Negative.   Psychiatric/Behavioral: Negative.   All other systems reviewed and are negative.      Objective:   Physical Exam  Constitutional: He is oriented to person, place, and time. He appears well-developed and well-nourished.  HENT:  Head: Normocephalic and atraumatic.  Eyes: Pupils are equal, round, and reactive to light. Conjunctivae and EOM are normal.  Neck: Normal range of motion.  Cardiovascular: Normal rate, regular rhythm and normal heart sounds.  No murmur heard. Pulmonary/Chest: Effort normal and breath sounds normal. No respiratory distress. He has no wheezes.  Abdominal: Soft. Bowel sounds are normal. He exhibits no distension. There is no tenderness.  Neurological: He is alert and oriented to person, place, and time. Gait abnormal.  Patient has left neglect on confrontation testing  Psychiatric: He has a normal mood and affect. His behavior is normal. Judgment and thought content normal.  Nursing note and vitals reviewed. Throat is clear tongue is midline no evidence of thrush Negative cervical adenopathy.  Vision is able to swallow secretions without pain        Assessment & Plan:  1.  Right posterior MCA distribution infarct with left neglect.  His hemiparesis has largely resolved.  He still has a gait disorder.  Unfortunately he has not scheduled his outpatient therapy until 30 April.  Apparently they are trying to avoid frequent co-pays and are scheduling PT OT and speech back to back which appears to be the hold up.  I will contact outpatient therapy and see if he can be scheduled earlier.  I offered family and patient the option of referring to another neuro rehab facility such as an  [facility] or [facility] but they declined.  I also offered sending him home health in the interim time which they declined. PMNR follow-up in 4-6 weeks Patient will follow up with PCP for hypertension and diabetic management

## 2017-08-19 ENCOUNTER — Other Ambulatory Visit (INDEPENDENT_AMBULATORY_CARE_PROVIDER_SITE_OTHER): Payer: Medicare HMO

## 2017-08-19 DIAGNOSIS — I69354 Hemiplegia and hemiparesis following cerebral infarction affecting left non-dominant side: Secondary | ICD-10-CM | POA: Diagnosis not present

## 2017-08-19 DIAGNOSIS — E1165 Type 2 diabetes mellitus with hyperglycemia: Secondary | ICD-10-CM

## 2017-08-19 DIAGNOSIS — Z794 Long term (current) use of insulin: Secondary | ICD-10-CM | POA: Diagnosis not present

## 2017-08-19 DIAGNOSIS — I779 Disorder of arteries and arterioles, unspecified: Secondary | ICD-10-CM | POA: Diagnosis not present

## 2017-08-19 DIAGNOSIS — R899 Unspecified abnormal finding in specimens from other organs, systems and tissues: Secondary | ICD-10-CM | POA: Diagnosis not present

## 2017-08-19 LAB — COMPREHENSIVE METABOLIC PANEL
ALBUMIN: 3.5 g/dL (ref 3.5–5.2)
ALT: 42 U/L (ref 0–53)
AST: 35 U/L (ref 0–37)
Alkaline Phosphatase: 110 U/L (ref 39–117)
BILIRUBIN TOTAL: 0.3 mg/dL (ref 0.2–1.2)
BUN: 25 mg/dL — AB (ref 6–23)
CALCIUM: 9.1 mg/dL (ref 8.4–10.5)
CHLORIDE: 101 meq/L (ref 96–112)
CO2: 29 mEq/L (ref 19–32)
CREATININE: 1.5 mg/dL (ref 0.40–1.50)
GFR: 58.03 mL/min — ABNORMAL LOW (ref >60.00)
Glucose, Bld: 95 mg/dL (ref 70–99)
Potassium: 4.4 mEq/L (ref 3.5–5.1)
SODIUM: 138 meq/L (ref 135–145)
TOTAL PROTEIN: 7.3 g/dL (ref 6.0–8.3)

## 2017-08-19 LAB — LIPID PANEL
CHOLESTEROL: 97 mg/dL (ref 0–200)
HDL: 24.2 mg/dL — ABNORMAL LOW (ref >39.00)
LDL CALC: 48 mg/dL (ref 0–99)
NonHDL: 72.45
Total CHOL/HDL Ratio: 4
Triglycerides: 123 mg/dL (ref 0.0–149.0)
VLDL: 24.6 mg/dL (ref 0.0–40.0)

## 2017-08-19 LAB — HEMOGLOBIN A1C: Hgb A1c MFr Bld: 7.2 % — ABNORMAL HIGH (ref 4.6–6.5)

## 2017-08-22 ENCOUNTER — Ambulatory Visit: Payer: Medicare Other | Admitting: Endocrinology

## 2017-08-25 ENCOUNTER — Encounter: Payer: Medicare HMO | Admitting: *Deleted

## 2017-08-25 ENCOUNTER — Encounter: Payer: Self-pay | Admitting: Endocrinology

## 2017-08-25 ENCOUNTER — Ambulatory Visit (INDEPENDENT_AMBULATORY_CARE_PROVIDER_SITE_OTHER): Payer: Medicare HMO | Admitting: Endocrinology

## 2017-08-25 VITALS — BP 110/58 | HR 74 | Ht 70.5 in | Wt 222.4 lb

## 2017-08-25 DIAGNOSIS — Z006 Encounter for examination for normal comparison and control in clinical research program: Secondary | ICD-10-CM

## 2017-08-25 DIAGNOSIS — E1165 Type 2 diabetes mellitus with hyperglycemia: Secondary | ICD-10-CM | POA: Diagnosis not present

## 2017-08-25 DIAGNOSIS — Z794 Long term (current) use of insulin: Secondary | ICD-10-CM

## 2017-08-25 DIAGNOSIS — I10 Essential (primary) hypertension: Secondary | ICD-10-CM

## 2017-08-25 DIAGNOSIS — N289 Disorder of kidney and ureter, unspecified: Secondary | ICD-10-CM | POA: Diagnosis not present

## 2017-08-25 DIAGNOSIS — E782 Mixed hyperlipidemia: Secondary | ICD-10-CM | POA: Diagnosis not present

## 2017-08-25 NOTE — Patient Instructions (Addendum)
Check blood sugars on waking up  4/7  Also check blood sugars about 2 hours after a meal and do this after different meals by rotation  Recommended blood sugar levels on waking up is 90-120 and about 2 hours after meal is 130-160  Please bring your blood sugar monitor to each visit, thank you  Reduce Insulin to 12 if am sugar is staying <90   Stop amlodipine

## 2017-08-25 NOTE — Progress Notes (Signed)
Patient ID: Eric Long, male   DOB: [birth year], 77 y.o.   MRN: [id number]   Reason for Appointment:  follow-up of diabetes  History of Present Illness   Diagnosis: Type 2 DIABETES MELITUS, date of diagnosis: 2006      Previous history: He was previously treated with metformin, Amaryl and Januvia but subsequently had poor control with A1c of 11.8 in 2011 and 11.6 in 7/13 In 8/13 is Januvia was stopped and he was started on Victoza and Lantus insulin With this his blood sugars were improved significantly and his Amaryl was stopped. He also started losing weight His A1c in 6/14 was excellent at 6.3  Recent history:    Insulin regimen: Tresiba 15 units at SUPPER, Novolog none        Non-insulin hypoglycemic drugs: Metformin 2 g daily, Victoza 1.8 mg daily IN AM       His A1c has been recently better and now 7.2  His insulin doses have been changed since his hospitalization for CVA in 3/19  Current management, blood sugar patterns and problems identified:   He was told to reduce his insulin after his hospitalization for his stroke  Also appears that his blood sugars are generally lower and fairly consistent even without taking any NOVOLOG since his hospitalization  This is likely to be from his improving his diet and his family helping him watch his diet better  Also not clear if he was doing his insulin and other medication consistently on his own previously  He has not had any hypoglycemia with lowest blood sugar 76 over the last month  Only rarely will have a higher blood sugar of 160-190 after meals  More recently is trying to get some protein consistent with breakfast also  Because of his stroke he is not as active as before  However has lost significant amount of weight since his last visit with improved diet       Meal times: Breakfast usually 9 AM: eggs, toast or oatmeal, dinner-7 PM  Monitors blood glucose:  about once a day .    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          Blood Glucose readings from review of monitor download:  Mean values apply above for all meters except median for One Touch  PRE-MEAL Fasting Lunch Dinner Bedtime Overall  Glucose range:  76-133      Mean/median:  97  125  124   108   POST-MEAL PC Breakfast PC Lunch PC Dinner  Glucose range:    109-163  Mean/median:      Physical activity: exercise: Starting physical therapy now  Dietician visit:  6/18, has seen CDE in 3/08         Complications: are: Peripheral vascular disease, neuropathy     Wt Readings from Last 3 Encounters:  08/25/17 222 lb 6.4 oz (100.9 kg)  08/18/17 241 lb (109.3 kg)  07/29/17 227 lb 1.2 oz (103 kg)     LABS:  Lab Results  Component Value Date   HGBA1C 7.2 (H) 08/19/2017   HGBA1C 7.6 (H) 07/22/2017   HGBA1C 7.4 05/22/2017   Lab Results  Component Value Date   MICROALBUR 1.7 01/29/2017   LDLCALC 48 08/19/2017   CREATININE 1.50 08/19/2017   Lab on 08/19/2017  Component Date Value Ref Range Status  . Cholesterol 08/19/2017 97  0 - 200 mg/dL Final   ATP III Classification       Desirable:  < 200  mg/dL               Borderline High:  200 - 239 mg/dL          High:  > = 240 mg/dL  . Triglycerides 08/19/2017 123.0  0.0 - 149.0 mg/dL Final   Normal:  <150 mg/dLBorderline High:  150 - 199 mg/dL  . HDL 08/19/2017 24.20* >39.00 mg/dL Final  . VLDL 08/19/2017 24.6  0.0 - 40.0 mg/dL Final  . LDL Cholesterol 08/19/2017 48  0 - 99 mg/dL Final  . Total CHOL/HDL Ratio 08/19/2017 4   Final                  Men          Women1/2 Average Risk     3.4          3.3Average Risk          5.0          4.42X Average Risk          9.6          7.13X Average Risk          15.0          11.0                      . NonHDL 08/19/2017 72.45   Final   NOTE:  Non-HDL goal should be 30 mg/dL higher than patient's LDL goal (i.e. LDL goal of < 70 mg/dL, would have non-HDL goal of < 100 mg/dL)  . Sodium 08/19/2017 138  135 - 145 mEq/L Final  . Potassium  08/19/2017 4.4  3.5 - 5.1 mEq/L Final  . Chloride 08/19/2017 101  96 - 112 mEq/L Final  . CO2 08/19/2017 29  19 - 32 mEq/L Final  . Glucose, Bld 08/19/2017 95  70 - 99 mg/dL Final  . BUN 08/19/2017 25* 6 - 23 mg/dL Final  . Creatinine, Ser 08/19/2017 1.50  0.40 - 1.50 mg/dL Final  . Total Bilirubin 08/19/2017 0.3  0.2 - 1.2 mg/dL Final  . Alkaline Phosphatase 08/19/2017 110  39 - 117 U/L Final  . AST 08/19/2017 35  0 - 37 U/L Final  . ALT 08/19/2017 42  0 - 53 U/L Final  . Total Protein 08/19/2017 7.3  6.0 - 8.3 g/dL Final  . Albumin 08/19/2017 3.5  3.5 - 5.2 g/dL Final  . Calcium 08/19/2017 9.1  8.4 - 10.5 mg/dL Final  . GFR 08/19/2017 58.03* >60.00 mL/min Final  . Hgb A1c MFr Bld 08/19/2017 7.2* 4.6 - 6.5 % Final   Glycemic Control Guidelines for People with Diabetes:Non Diabetic:  <6%Goal of Therapy: <7%Additional Action Suggested:  >8%        Allergies as of 08/25/2017   No Active Allergies     Medication List        Accurate as of 08/25/17 11:59 PM. Always use your most recent med list.          acetaminophen 325 MG tablet Commonly known as:  TYLENOL Take 1-2 tablets (325-650 mg total) by mouth every 4 (four) hours as needed for mild pain.   aspirin 325 MG EC tablet Take 1 tablet (325 mg total) by mouth daily.   benazepril 5 MG tablet Commonly known as:  LOTENSIN Take 1 tablet (5 mg total) by mouth daily.   clopidogrel 75 MG tablet Commonly known as:  PLAVIX Take 1 tablet (75 mg total) by mouth  daily.   ezetimibe 10 MG tablet Commonly known as:  ZETIA Take 1 tablet (10 mg total) by mouth daily.   glucose blood test strip Commonly known as:  ONETOUCH VERIO Use to check blood sugar 2 times per day by rotation of meals. Dx code: E11.9   iron polysaccharides 150 MG capsule Commonly known as:  NIFEREX Take 1 capsule (150 mg total) by mouth daily.   liraglutide 18 MG/3ML Sopn Commonly known as:  VICTOZA INJECT 0.3 MLS  (1.8 MG TOTAL) INTO THE SKIN ONCE A  DAY   metoprolol succinate 50 MG 24 hr tablet Commonly known as:  TOPROL-XL Take 1 tablet (50 mg total) by mouth daily after breakfast.   RELION PEN NEEDLES 32G X 4 MM Misc Generic drug:  Insulin Pen Needle USE ONE PEN NEEDLE TWICE DAILY   rosuvastatin 40 MG tablet Commonly known as:  CRESTOR Take 1 tablet (40 mg total) by mouth daily.   TRESIBA FLEXTOUCH [place] Inject 15 Units into the skin daily.       Allergies:  No Active Allergies  Past Medical History:  Diagnosis Date  . Diabetes mellitus without complication ([place])   . Hyperlipidemia     Past Surgical History:  Procedure Laterality Date  . FEMORAL-FEMORAL BYPASS GRAFT Bilateral 2007    Family History  Problem Relation Age of Onset  . Diabetes Sister     Social History:  reports that he has quit smoking. He has never used smokeless tobacco. He reports that he does not drink alcohol or use drugs.  Review of Systems:  Hypertension:  He was on Lotrel and metoprolol, also followed by PCP  His medications have been changed since his discharge and he is taking lower doses of benazepril and amlodipine now No lightheadedness even with his low normal blood pressure readings recently   BP Readings from Last 3 Encounters:  08/25/17 (!) 110/58  08/18/17 118/74  08/09/17 (!) 158/76    His creatinine is upper normal usually variable  Lab Results  Component Value Date   CREATININE 1.50 08/19/2017   CREATININE 1.31 (H) 08/06/2017   CREATININE 1.20 07/30/2017     Lipids: Has had mixed hyperlipidemia and also significantly high LDL Previously had persistently high LDL value Even though he kept insisting that he was taking his Crestor and Zetia regularly in the past his labs were consistently abnormal Now without any change in medications his cholesterol is markedly improved His family thinks that he is taking his medications regularly now also    Lab Results  Component Value Date   CHOL 97 08/19/2017   HDL  24.20 (L) 08/19/2017   LDLCALC 48 08/19/2017   LDLDIRECT 151.0 03/17/2017   TRIG 123.0 08/19/2017   CHOLHDL 4 08/19/2017         Examination:   BP (!) 110/58   Pulse 74   Ht 5' 10.5" (1.791 m)   Wt 222 lb 6.4 oz (100.9 kg)   SpO2 97%   BMI 31.46 kg/m   Body mass index is 31.46 kg/m.    ASSESSMENT/ PLAN:   Diabetes type 2 with mild obesity  See history of present illness for detailed discussion of his current management, blood sugar patterns and problems identified  With his improving his diet and being consistent with all his medication regimen he appears to be needing less insulin and has lost weight Currently even without any mealtime rapid acting insulin his blood sugars are not high after meals This is likely to  be from reduced carbohydrates, portions, healthier meals and continuing the Victoza His family has been helping him with his improved compliance  The last few readings in his meter indicate her fasting readings are just over 100 and he can continue the same dose of Tresiba for now However complained to his family that if he has readings under 90 consistently in the morning he will reduce insulin down to 12  HYPERLIPIDEMIA: This is now very well controlled with being compliant with medication Since his cholesterol is come down without any other intervention and also with improved diet he can continue on the same regimen Also may consider stopping Zetia since his LDL is only 48 now Does need to continue Crestor because of his recent stroke  His family had several questions on his management, blood sugar targets, insulin adjustment, lab results and all of these were discussed in detail and patient informed of the plan  BLOOD pressure: Since he has lost weight and diet is lower in sodium his blood pressure is low normal now Considering that his creatinine is also relatively higher along with recent stroke will need to stop his amlodipine   Patient Instructions   Check blood sugars on waking up  4/7  Also check blood sugars about 2 hours after a meal and do this after different meals by rotation  Recommended blood sugar levels on waking up is 90-120 and about 2 hours after meal is 130-160  Please bring your blood sugar monitor to each visit, thank you  Reduce Insulin to 12 if am sugar is staying <90   Stop amlodipine   Total visit time for evaluation and management of multiple problems and counseling =25 minutes  Elayne Snare 08/26/2017, 11:49 AM

## 2017-08-26 ENCOUNTER — Ambulatory Visit: Payer: Medicare HMO | Admitting: Occupational Therapy

## 2017-08-26 ENCOUNTER — Ambulatory Visit: Payer: Medicare HMO

## 2017-08-26 ENCOUNTER — Encounter: Payer: Self-pay | Admitting: Occupational Therapy

## 2017-08-26 DIAGNOSIS — R482 Apraxia: Secondary | ICD-10-CM

## 2017-08-26 DIAGNOSIS — R41842 Visuospatial deficit: Secondary | ICD-10-CM

## 2017-08-26 DIAGNOSIS — R208 Other disturbances of skin sensation: Secondary | ICD-10-CM

## 2017-08-26 DIAGNOSIS — R2689 Other abnormalities of gait and mobility: Secondary | ICD-10-CM | POA: Diagnosis not present

## 2017-08-26 DIAGNOSIS — R2681 Unsteadiness on feet: Secondary | ICD-10-CM | POA: Diagnosis not present

## 2017-08-26 DIAGNOSIS — R414 Neurologic neglect syndrome: Secondary | ICD-10-CM | POA: Diagnosis not present

## 2017-08-26 DIAGNOSIS — I69318 Other symptoms and signs involving cognitive functions following cerebral infarction: Secondary | ICD-10-CM

## 2017-08-26 DIAGNOSIS — R278 Other lack of coordination: Secondary | ICD-10-CM

## 2017-08-26 DIAGNOSIS — R29818 Other symptoms and signs involving the nervous system: Secondary | ICD-10-CM

## 2017-08-26 DIAGNOSIS — I69354 Hemiplegia and hemiparesis following cerebral infarction affecting left non-dominant side: Secondary | ICD-10-CM | POA: Diagnosis not present

## 2017-08-26 DIAGNOSIS — R41841 Cognitive communication deficit: Secondary | ICD-10-CM | POA: Diagnosis not present

## 2017-08-26 NOTE — Patient Instructions (Addendum)
  Perform in corner with chair in front of you OR at kitchen sink with chair behind you for safety: perform with someone there with you.  Feet Apart, Head Motion - Eyes Open    Hold chair as needed. With eyes open, feet apart, move head slowly: up and down 5 times and side to side 5 times. Repeat __3__ times per session. Do __1__ sessions per day.  Copyright  VHI. All rights reserved.  Feet Apart, Varied Arm Positions - Eyes Closed    Stand with feet shoulder width apart and arms at your side or holding chair as needed. Close eyes and visualize upright position. Hold __20-30__ seconds. Repeat __3__ times per session. Do __1__ sessions per day.  Copyright  VHI. All rights reserved.  Feet Together, Varied Arm Positions - Eyes Open    With eyes open, feet together, arms at your side and holding counter as needed, look straight ahead at a stationary object. Hold __30__ seconds. Repeat __3__ times per session. Do __1__ sessions per day.  Copyright  VHI. All rights reserved.  Single Leg - Eyes Open    Holding support, lift right leg while maintaining balance over other leg. Progress to removing hands from support surface for longer periods of time. Hold_10-30___ seconds. Repeat with other leg, might need to block left leg to ensure safety. Repeat __3__ times per session per leg. Do __1__ sessions per day.  Copyright  VHI. All rights reserved.

## 2017-08-26 NOTE — Progress Notes (Signed)
1 month follow up for Stroke AF:  Pt here today and doing well. No complaints of chest pain, sob, edema, fatigue, palpitations, or syncope. He has not had any stroke like symptoms.  No unscheduled visits, ER visits, hospitalizations, or procedures since last visit.  He is standard of care so no interrogation.  Saw internal medicine MD today and he stopped his amlodipine.  Will see him back in 6 months.   Health Questionnaire  English version for the [place]    By placing a checkmark in one box in each group below, please indicate which statements best describe your own health state today.     Mobility   I have no problems in walking about ?  I have some problems in walking about X  I am confined to bed ?     Self-Care   I have no problems with self-care X  I have some problems washing or dressing myself ?  I am unable to wash or dress myself ?     Usual Activities (e.g. work, study, housework, family or leisure activities)   I have no problems with performing my usual activities ?  I have some problems with performing my usual activities X  I am unable to perform my usual activities ?     Pain / Discomfort   I have no pain or discomfort X  I have moderate pain or discomfort ?  I have extreme pain or discomfort ?     Anxiety / Depression   I am not anxious or depressed X  I am moderately anxious or depressed ?  I am extremely anxious or depressed ?   To help people say how good or bad a health state is, we have drawn a scale (rather like a thermometer) on which the best state you can imagine is marked 100 and the worst state you can imagine is marked 0.    We would like you to indicate on this scale how good or bad your own health is today, in your opinion. Please do this by drawing a line from the box below to whichever point on the scale indicates how good or bad your health state is today.   54   MODIFIED RANKIN SCALE  The modified Rankin Scale (mRS) is a scale for  measuring the degree of disability or dependence in the daily activated of people who have suffered a stroke.  Indicate mRS score:  Score  Description:  []   0  No symptoms at all  []   1  No significant disability. Able to carry out all usual assistance, despite some                                  symptoms.   [x]   2 Slight disability. Able to look after own affairs without assistance, but unable to carry our all previous activities.   []   3 Moderate disability. Requires some help, but able to walk unassisted.   []   4 Moderately severe disability. Unable to attend to own bodily needs without assistance, and unable to walk unassisted.   []   5 Severe disability. Requires constant nursing care and attention, bedridden, incontinent.    []   6  Dead   Current Outpatient Medications:  .  acetaminophen (TYLENOL) 325 MG tablet, Take 1-2 tablets (325-650 mg total) by mouth every 4 (four) hours as needed for mild pain., Disp: , Rfl:  .  aspirin EC 325 MG EC tablet, Take 1 tablet (325 mg total) by mouth daily., Disp: 30 tablet, Rfl: 0 .  benazepril (LOTENSIN) 5 MG tablet, Take 1 tablet (5 mg total) by mouth daily., Disp: 30 tablet, Rfl: 0 .  clopidogrel (PLAVIX) 75 MG tablet, Take 1 tablet (75 mg total) by mouth daily., Disp: 30 tablet, Rfl: 0 .  ezetimibe (ZETIA) 10 MG tablet, Take 1 tablet (10 mg total) by mouth daily., Disp: 30 tablet, Rfl: 0 .  glucose blood (ONETOUCH VERIO) test strip, Use to check blood sugar 2 times per day by rotation of meals. Dx code: E11.9, Disp: 100 each, Rfl: 3 .  Insulin Degludec (TRESIBA FLEXTOUCH [place]), Inject 15 Units into the skin daily. , Disp: , Rfl:  .  iron polysaccharides (NIFEREX) 150 MG capsule, Take 1 capsule (150 mg total) by mouth daily., Disp: 30 capsule, Rfl: 0 .  liraglutide (VICTOZA) 18 MG/3ML SOPN, INJECT 0.3 MLS  (1.8 MG TOTAL) INTO THE SKIN ONCE A DAY, Disp: 9 mL, Rfl: 3 .  metoprolol succinate (TOPROL-XL) 50 MG 24 hr tablet, Take 1 tablet (50 mg  total) by mouth daily after breakfast., Disp: 30 tablet, Rfl: 0 .  RELION PEN NEEDLES 32G X 4 MM MISC, USE ONE PEN NEEDLE TWICE DAILY, Disp: 200 each, Rfl: 2 .  rosuvastatin (CRESTOR) 40 MG tablet, Take 1 tablet (40 mg total) by mouth daily., Disp: 30 tablet, Rfl: 0

## 2017-08-26 NOTE — Therapy (Signed)
[facility] [address] [place], [place], [address] Phone: [phone]   Fax:  [phone]  Occupational Therapy Treatment  Patient Details  Name: Eric Long MRN: [id number] Date of Birth: 1940-12-10 Referring Provider: Dr. Letta Pate   Encounter Date: 08/26/2017  OT End of Session - 08/26/17 1314    Visit Number  2    Number of Visits  16    Date for OT Re-Evaluation  10/07/17    Authorization Type  medicare    Authorization Time Period  90 days    OT Start Time  0933    OT Stop Time  1015    OT Time Calculation (min)  42 min    Activity Tolerance  Patient tolerated treatment well       Past Medical History:  Diagnosis Date  . Diabetes mellitus without complication ([place])   . Hyperlipidemia     Past Surgical History:  Procedure Laterality Date  . FEMORAL-FEMORAL BYPASS GRAFT Bilateral 2007    There were no vitals filed for this visit.  Subjective Assessment - 08/26/17 0933    Subjective   Thanks for changing that goal    Patient is accompained by:  Family member son Francee Piccolo    Pertinent History  R MCA CVA, acute encephalopathy, severe L carotid stenosis.      Patient Stated Goals  back to where I am stable with walking, use the computer, continue reading and writing.     Currently in Pain?  No/denies                   OT Treatments/Exercises (OP) - 08/26/17 0001      ADLs   ADL Comments  Reviewed goals and OT POC. Pt in agreement with all but 1 goal - goal removed. Pt also wished to add reading goal.  Goals adjusted and pt in agreement. Assessed Box and Blocks = 38 therefore will not set goal for this. Assessed 9 hold peg - feel score was impacted by visual perceptual as well as cognitive issues as both hands were slowed (R= 30.22 and L= 37.8) therefore will not set goal but did initiate HEP with some activities to address increasing attention to L hand and to work on functional  coordination.        Neurological Re-education Exercises   Other Exercises 1  Pt issued limited coordination HEP  - pt required mod cues to stay focused with task to complete.  Will add activities as possible.               OT Education - 08/26/17 1311    Education provided  Yes    Education Details  began coordination/attention to L hand home program     Person(s) Educated  Patient;Child(ren)    Methods  Explanation;Demonstration;Handout    Comprehension  Verbalized understanding;Returned demonstration       OT Short Term Goals - 08/26/17 1311      OT SHORT TERM GOAL #1   Title  Pt and family will be mod I for home activities program for L field cut/L neglect - 09/09/2017    Status  On-going      OT SHORT TERM GOAL #2   Title  Assessed 9 hole peg and determine goal if appropriate.    Status  Achieved see PN      OT SHORT TERM GOAL #3   Title  Assess Box and blocks and determine goal if appropriate  Status  Achieved L= 38      OT SHORT TERM GOAL #4   Title  Pt will be supevision for UB dressing     Status  On-going      OT SHORT TERM GOAL #5   Title  Pt will need no more than 3 vc's for clothing orientation when dressing.    Status  On-going      OT SHORT TERM GOAL #6   Title  Pt will be min a, mod cues for LB dressing    Status  On-going      OT SHORT TERM GOAL #7   Title  Pt will locate items during functional tasks on L with no more than min cues     Status  On-going        OT Long Term Goals - 08/26/17 1311      OT LONG TERM GOAL #1   Title  Pt and family will be mod I upgraded HEP prn - 10/07/2017    Status  On-going      OT LONG TERM GOAL #2   Title  Pt will demonstrate envrionmental scanning during functional task with at least 90% accuracy    Status  On-going      OT LONG TERM GOAL #3   Title  Pt will be able to complete bilateral tasks with no more than min cues to attend to L hand    Status  On-going      OT LONG TERM GOAL #4   Title  Pt  will be mod I with eating    Status  On-going      OT LONG TERM GOAL #5   Title  Pt will be supervision for simple yard tasks.    Status  Deferred at pts request      OT LONG TERM GOAL #6   Title  Pt will be mod I with dressing      OT LONG TERM GOAL #7   Title  Pt will be mod I with toilet and shower transfers    Status  New      OT LONG TERM GOAL #8   Title  Pt will be able to read simple paragraph with compensations and min cueing    Status  New            Plan - 08/26/17 1312    Clinical Impression Statement  Pt is in agreement with all but one goal - goal deferred.  Pt requested reading goal be added therefore added goal.     Occupational Profile and client history currently impacting functional performance  PMH:  DM, HTN, HLD, chronic kidney disease, PVD, severe L extracranial carotid stenosis.      Occupational performance deficits (Please refer to evaluation for details):  ADL's;IADL's;Rest and Sleep;Leisure;Social Participation    Rehab Potential  Good    OT Frequency  2x / week    OT Duration  8 weeks    OT Treatment/Interventions  Self-care/ADL training;Aquatic Therapy;Electrical Stimulation;DME and/or AE instruction;Neuromuscular education;Therapeutic exercise;Functional Mobility Training;Manual Therapy;Therapeutic activities;Cognitive remediation/compensation;Balance training;Patient/family education;Visual/perceptual remediation/compensation    Plan  assess motor planning further with familiar functional tasks, environmental and table top scanning, reading, NMR for LUE functional use and mobility, sustained attention in task,    Consulted and Agree with Plan of Care  Patient;Family member/caregiver    Family Member Consulted  son Francee Piccolo       Patient will benefit from skilled therapeutic intervention in order to improve  the following deficits and impairments:  Abnormal gait, Decreased activity tolerance, Decreased balance, Decreased coordination, Decreased  cognition, Decreased knowledge of use of DME, Decreased mobility, Decreased safety awareness, Difficulty walking, Impaired UE functional use, Impaired sensation, Impaired vision/preception  Visit Diagnosis: Hemiplegia and hemiparesis following cerebral infarction affecting left non-dominant side (HCC)  Other lack of coordination  Other disturbances of skin sensation  Apraxia  Left-sided neglect  Other symptoms and signs involving cognitive functions following cerebral infarction  Other symptoms and signs involving the nervous system  Unsteadiness on feet  Visuospatial deficit    Problem List Patient Active Problem List   Diagnosis Date Noted  . Acute ischemic right MCA stroke ([place]) 07/26/2017  . Hyperlipidemia 07/23/2017  . Diastolic dysfunction   . Acute blood loss anemia   . Essential hypertension 07/22/2017  . Type 2 diabetes mellitus with renal manifestations ([place]) 07/22/2017  . Acute CVA (cerebrovascular accident) ([place]) 07/21/2017  . Chronic kidney disease, stage III (moderate) ([place]) 02/19/2013  . Atherosclerotic peripheral vascular disease ([place]) 02/19/2013  . Pure hypercholesterolemia 02/19/2013  . Diabetes mellitus, stable ([place]) 02/05/2013    Quay Burow, OTR/L 08/26/2017, 1:17 PM  [facility] [address] [place], [place], [address] Phone: [phone]   Fax:  [phone]  Name: Eric Long MRN: [id number] Date of Birth: 24-Jun-1940

## 2017-08-26 NOTE — Therapy (Signed)
[facility] [address] [place], [place], [address] Phone: [phone]   Fax:  [phone]  Physical Therapy Treatment  Patient Details  Name: Eric Long MRN: [id number] Date of Birth: November 30, 1940 Referring Provider: Dr. Letta Pate   Encounter Date: 08/26/2017  PT End of Session - 08/26/17 1107    Visit Number  2    Number of Visits  17    Date for PT Re-Evaluation  10/11/17    Authorization Type  [employer]    PT Start Time  3664    PT Stop Time  1055    PT Time Calculation (min)  40 min    Equipment Utilized During Treatment  Gait belt    Activity Tolerance  Patient tolerated treatment well    Behavior During Therapy  [facility] for tasks assessed/performed;Flat affect       Past Medical History:  Diagnosis Date  . Diabetes mellitus without complication ([place])   . Hyperlipidemia     Past Surgical History:  Procedure Laterality Date  . FEMORAL-FEMORAL BYPASS GRAFT Bilateral 2007    There were no vitals filed for this visit.  Subjective Assessment - 08/26/17 1018    Subjective  Pt denied falls since last visit. Pt reports he is walking 38' with [facility] about 2-3 times a day.     Patient is accompained by:  Family member Robert: son    Patient Stated Goals  Return to IND living, walk without concern, build up stamina     Currently in Pain?  No/denies           Neuro re-ed: Pt performed standing with w/c behind pt and chair in front of pt with min guard to S for safety. PT blocked pt's L knee to ensure safety during SLS but knee did not buckle. PT then had pt's son review balance HEP with pt to ensure proper technique and safety. Please see pt instructions for HEP details.            [facility] Adult PT Treatment/Exercise - 08/26/17 1020      Standardized Balance Assessment   Standardized Balance Assessment  Berg Balance Test      Berg Balance Test   Sit to Stand  Able to stand  independently  using hands    Standing Unsupported  Able to stand 2 minutes with supervision    Sitting with Back Unsupported but Feet Supported on Floor or Stool  Able to sit safely and securely 2 minutes    Stand to Sit  Controls descent by using hands    Transfers  Able to transfer with verbal cueing and /or supervision    Standing Unsupported with Eyes Closed  Able to stand 10 seconds with supervision    Standing Ubsupported with Feet Together  Able to place feet together independently and stand for 1 minute with supervision    From Standing, Reach Forward with Outstretched Arm  Can reach forward >12 cm safely (5") 6" with LUE and 8" with RUE    From Standing Position, Pick up Object from Floor  Able to pick up shoe, needs supervision    From Standing Position, Turn to Look Behind Over each Shoulder  Needs assist to keep from losing balance and falling assist with head turns only, incr. assist c trunk rotation    Turn 360 Degrees  Needs close supervision or verbal cueing    Standing Unsupported, Alternately Place Feet on Step/Stool  Needs assistance to keep from  falling or unable to try    Standing Unsupported, One Foot in [place] to take small step independently and hold 30 seconds    Standing on One Leg  Tries to lift leg/unable to hold 3 seconds but remains standing independently R SLS    Total Score  31     Seated rest breaks during BERG 2/2 fatigue.         PT Education - 08/26/17 1106    Education provided  Yes    Education Details  PT educated pt on BERG results and provided pt with balance HEP.     Person(s) Educated  Patient;Child(ren)    Methods  Explanation;Demonstration;Tactile cues;Verbal cues;Handout    Comprehension  Returned demonstration;Verbalized understanding;Need further instruction       PT Short Term Goals - 08/26/17 1109      PT SHORT TERM GOAL #1   Title  Pt will perform HEP with S to improve balance, endurance, strength, and safety. TARGET DATE FOR ALL STGS:  09/09/17    Status  New      PT SHORT TERM GOAL #2   Title  Pt will perform TUG with LRAD, with S,  in </=25 sec. to decr. falls risk    Status  New      PT SHORT TERM GOAL #3   Title  Pt will improve gait speed with LRAD to >/=1.36ft/sec. to decr. falls risk.     Status  New      PT SHORT TERM GOAL #4   Title  Perform BERG and write STG and LTG as indicated.     Status  Achieved      PT SHORT TERM GOAL #5   Title  Pt will amb. 300' over even terrain with LRAD and S to improve safety during functional mobility.     Status  New      Additional Short Term Goals   Additional Short Term Goals  Yes      PT SHORT TERM GOAL #6   Title  Pt will improve BERG score to >/=35/56 to decr. falls risk.     Status  New        PT Long Term Goals - 08/26/17 1110      PT LONG TERM GOAL #1   Title  Pt will verbalize understanding of CVA risk factors and s/s to decr. risk of CVA. (TARGET DATE FOR ALL LTGS: 10/07/17)    Status  New      PT LONG TERM GOAL #2   Title  Pt will be IND with HEP to improve strength, safety, balance, and endurance.     Status  New      PT LONG TERM GOAL #3   Title  Pt will perform TUG with LRAD, at MOD I level, in </=13.5 sec. to decr. falls risk.     Status  New      PT LONG TERM GOAL #4   Title  Pt will improve gait speed to >/=2.36ft/sec. with LRAD to amb. safely in the community.     Status  New      PT LONG TERM GOAL #5   Title  Pt will amb. 600' over uneven terrain with LRAD at MOD I level to improve functional mobility and safety.     Status  New      Additional Long Term Goals   Additional Long Term Goals  Yes      PT LONG TERM GOAL #6  Title  Pt will improve BERG score to >/=39/56 to decr. falls risk.     Status  New            Plan - 08/26/17 1107    Clinical Impression Statement  Pt's BERG score indicates pt is at a high risk for falls. PT provided pt with balance HEP and had pt's son perform with pt to ensure proper technique and safety  at home. Pt required cues to improve L foot ER during stance and was able to correct with cues. Pt required seated rest breaks 2/2 fatigue. Pt would continue to benefit from skilled PT to improve safety during functional mobility.     Rehab Potential  Good    Clinical Impairments Affecting Rehab Potential  see above.     PT Frequency  2x / week    PT Duration  8 weeks    PT Treatment/Interventions  ADLs/Self Care Home Management;Biofeedback;Canalith Repostioning;Electrical Stimulation;Therapeutic exercise;Therapeutic activities;Manual techniques;Wheelchair mobility training;Vestibular;Functional mobility training;Orthotic Fit/Training;Stair training;Gait training;Patient/family education;Cognitive remediation;DME Instruction;Neuromuscular re-education;Balance training    PT Next Visit Plan   Establish functional strengthening and flexibility HEP, trial amb. with pt's SBQC.    Consulted and Agree with Plan of Care  Patient;Family member/caregiver    Family Member Consulted  pt's wife ([name]?), [name], [name]: dtr       Patient will benefit from skilled therapeutic intervention in order to improve the following deficits and impairments:  Abnormal gait, Decreased endurance, Impaired sensation, Postural dysfunction, Impaired flexibility, Impaired tone, Decreased coordination, Decreased safety awareness, Decreased cognition, Decreased range of motion, Dizziness, Decreased balance, Decreased mobility, Decreased activity tolerance, Decreased knowledge of use of DME, Decreased strength, Impaired UE functional use  Visit Diagnosis: Hemiplegia and hemiparesis following cerebral infarction affecting left non-dominant side (HCC)  Other lack of coordination  Other abnormalities of gait and mobility     Problem List Patient Active Problem List   Diagnosis Date Noted  . Acute ischemic right MCA stroke ([place]) 07/26/2017  . Hyperlipidemia 07/23/2017  . Diastolic dysfunction   . Acute blood loss  anemia   . Essential hypertension 07/22/2017  . Type 2 diabetes mellitus with renal manifestations ([place]) 07/22/2017  . Acute CVA (cerebrovascular accident) ([place]) 07/21/2017  . Chronic kidney disease, stage III (moderate) ([place]) 02/19/2013  . Atherosclerotic peripheral vascular disease ([place]) 02/19/2013  . Pure hypercholesterolemia 02/19/2013  . Diabetes mellitus, stable ([place]) 02/05/2013    Daisy Mcneel L 08/26/2017, 11:11 AM  [facility] [address] [place], [place], [address] Phone: [phone]   Fax:  [phone]  Name: Eric Long MRN: [id number] Date of Birth: 1941/05/03  Geoffry Paradise, PT,DPT 08/26/17 11:12 AM Phone: [phone] Fax: [phone]

## 2017-08-26 NOTE — Patient Instructions (Signed)
  Coordination Activities  Perform the following activities for 15-20 minutes 1-2 times per day with left hand(s).   Flip cards 1 at a time as fast as you can.  Pick up coins and place in container or coin bank.

## 2017-08-28 ENCOUNTER — Ambulatory Visit: Payer: Medicare HMO | Admitting: Speech Pathology

## 2017-08-28 DIAGNOSIS — R2681 Unsteadiness on feet: Secondary | ICD-10-CM | POA: Diagnosis not present

## 2017-08-28 DIAGNOSIS — R482 Apraxia: Secondary | ICD-10-CM | POA: Diagnosis not present

## 2017-08-28 DIAGNOSIS — R278 Other lack of coordination: Secondary | ICD-10-CM | POA: Diagnosis not present

## 2017-08-28 DIAGNOSIS — R41842 Visuospatial deficit: Secondary | ICD-10-CM | POA: Diagnosis not present

## 2017-08-28 DIAGNOSIS — R414 Neurologic neglect syndrome: Secondary | ICD-10-CM | POA: Diagnosis not present

## 2017-08-28 DIAGNOSIS — I69354 Hemiplegia and hemiparesis following cerebral infarction affecting left non-dominant side: Secondary | ICD-10-CM | POA: Diagnosis not present

## 2017-08-28 DIAGNOSIS — R41841 Cognitive communication deficit: Secondary | ICD-10-CM | POA: Diagnosis not present

## 2017-08-28 DIAGNOSIS — R29818 Other symptoms and signs involving the nervous system: Secondary | ICD-10-CM | POA: Diagnosis not present

## 2017-08-28 DIAGNOSIS — R2689 Other abnormalities of gait and mobility: Secondary | ICD-10-CM | POA: Diagnosis not present

## 2017-08-28 DIAGNOSIS — I69318 Other symptoms and signs involving cognitive functions following cerebral infarction: Secondary | ICD-10-CM | POA: Diagnosis not present

## 2017-08-28 NOTE — Therapy (Signed)
[facility] [address] [place], [place], [address] Phone: [phone]   Fax:  [phone]  Speech Language Pathology Treatment  Patient Details  Name: Eric Long MRN: [id number] Date of Birth: 11-25-1940 Referring Provider: Dr. Alysia Penna   Encounter Date: 08/28/2017  End of Session - 08/28/17 1216    Visit Number  2    Number of Visits  17    Date for SLP Re-Evaluation  10/10/17    Authorization Type  60 total visits - if PT/OT/ST are on 1 day, it counts as 1 visit    SLP Start Time  1020    SLP Stop Time   1102    SLP Time Calculation (min)  42 min    Activity Tolerance  Patient tolerated treatment well       Past Medical History:  Diagnosis Date  . Diabetes mellitus without complication ([place])   . Hyperlipidemia     Past Surgical History:  Procedure Laterality Date  . FEMORAL-FEMORAL BYPASS GRAFT Bilateral 2007    There were no vitals filed for this visit.  Subjective Assessment - 08/28/17 1022    Subjective  "How not to be distracted." re: what he learned from ST eval    Currently in Pain?  No/denies            ADULT SLP TREATMENT - 08/28/17 1020      General Information   Behavior/Cognition  Alert;Cooperative    Patient Positioning  Upright in chair      Treatment Provided   Treatment provided  Cognitive-Linquistic      Pain Assessment   Pain Assessment  No/denies pain      Cognitive-Linquistic Treatment   Treatment focused on  Cognition    Skilled Treatment  Pt arrived with shoes on wrong feet; pt dressed himself this morning. Pt, wife told SLP about his medication routine; daughter organizes 14 day pill box, which wife checks and gives medications to pt at appropriate time. SLP targeted selective attention, organization, reasoning and problem solving during functional medication tasks. Min cues required to initiate task; SLP facilitated problem solving with mod question cues  about how to approach the task. Pt stated an appropriate strategy (categorize medications), however he read medication labels repeatedly without categorizing, cues required for selective attention (morning vs evening). During the task, pt verbalized awareness of attention impairment ("I'm losing my place.") SLP decreased task complexity, having pt read instructions for single medication and fill pill box accordingly. Pt neglected 2 boxes and added an additional tablet, all on left side. Mod cues required for error awareness. When confronted with errors, pt asked, "How do you know when to trust yourself." SLP reinforced importance of double-checking for errors. Pt navigated to appropriate slot in pill box when given day, time with extended time and min-mod A. SLP encouraged pt's wife to allow pt to attempt to locate appropriate slot in pill box at home, will full supervision.      Assessment / Recommendations / Plan   Plan  Continue with current plan of care      Progression Toward Goals   Progression toward goals  Progressing toward goals       SLP Education - 08/28/17 1216    Education provided  Yes    Education Details  attempt locating appropriate slot in medication box, with supervision    Person(s) Educated  Patient;Spouse    Methods  Explanation;Demonstration    Comprehension  Verbalized understanding;Returned demonstration;Verbal  cues required;Need further instruction       SLP Short Term Goals - 08/28/17 1218      SLP SHORT TERM GOAL #1   Title  Pt will selectively attend to mildly complex cognitive linguistic task with distractions and occasional min redirection    Time  4    Period  Weeks    Status  On-going      SLP SHORT TERM GOAL #2   Title  Pt will complete milldy complex organization, reasoning, problem sovling tasks with 85% accuracy and occasional min A    Time  4    Period  Weeks    Status  On-going      SLP SHORT TERM GOAL #3   Title  Pt will ID errors and self  correct 3/5 errors with occasional min A    Time  4    Period  Weeks    Status  On-going      SLP SHORT TERM GOAL #4   Title  Pt will utilize external aids for medication, schedule management with occasional min A from family outside of therapy over 2 sessions    Time  4    Period  Weeks    Status  On-going       SLP Long Term Goals - 08/28/17 1219      SLP LONG TERM GOAL #1   Title  Pt will alternate attention between 2 mildly complex cognitive linguistic tasksk with 85% on each and occasional min A    Time  8    Period  Weeks    Status  On-going      SLP LONG TERM GOAL #2   Title  Pt will self correct errors 4/5 errors with occasional min A    Time  8    Period  Weeks    Status  On-going      SLP LONG TERM GOAL #3   Title  Pt will recall details from paragraph, attending to the left, with 80% accuracy and occasional min A    Time  8    Period  Weeks    Status  On-going       Plan - 08/28/17 1217    Clinical Impression Statement  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 Impairments seen today in simple reasoning, problem solving, selective attention, organization and error awareness. Prior to this CVA, Mr. Westry was independent in ADL's, finances and medication mangagement. I  recommend skilled ST to maximize cognition for safety, independnce, and to reduce caregiver burden.     Speech Therapy Frequency  2x / week    Treatment/Interventions  Environmental controls;Cueing hierarchy;SLP instruction and feedback;Cognitive reorganization;Functional tasks;Compensatory strategies;Internal/external aids;Patient/family education    Potential to Achieve Goals  Good    Consulted and Agree with Plan of Care  Patient;Family member/caregiver    Family Member Consulted  spouse       Patient will benefit from skilled therapeutic intervention in order to improve the following deficits and  impairments:   Cognitive communication deficit    Problem List Patient Active Problem List   Diagnosis Date Noted  . Acute ischemic right MCA stroke ([place]) 07/26/2017  . Hyperlipidemia 07/23/2017  . Diastolic dysfunction   . Acute blood loss anemia   . Essential hypertension 07/22/2017  . Type 2 diabetes mellitus with renal manifestations ([place]) 07/22/2017  . Acute CVA (cerebrovascular accident) ([place]) 07/21/2017  .  Chronic kidney disease, stage III (moderate) ([place]) 02/19/2013  . Atherosclerotic peripheral vascular disease ([place]) 02/19/2013  . Pure hypercholesterolemia 02/19/2013  . Diabetes mellitus, stable ([place]) 02/05/2013   Deneise Lever, [place], [place] 08/28/2017, 12:19 PM  [facility] [address] [place], [place], [address] Phone: [phone]   Fax:  [phone]   Name: DEITRICK FERRERI MRN: [id number] Date of Birth: 04/09/1941

## 2017-08-28 NOTE — Patient Instructions (Signed)

## 2017-08-31 ENCOUNTER — Other Ambulatory Visit: Payer: Self-pay | Admitting: Physical Medicine and Rehabilitation

## 2017-08-31 DIAGNOSIS — I639 Cerebral infarction, unspecified: Secondary | ICD-10-CM

## 2017-09-01 ENCOUNTER — Other Ambulatory Visit: Payer: Self-pay

## 2017-09-01 ENCOUNTER — Telehealth: Payer: Self-pay | Admitting: Endocrinology

## 2017-09-01 MED ORDER — GLUCOSE BLOOD VI STRP: ORAL_STRIP | 12 refills | Status: DC

## 2017-09-01 NOTE — Telephone Encounter (Signed)
New Rx has been sent to pharmacy.

## 2017-09-01 NOTE — Telephone Encounter (Signed)
glucose blood (ONETOUCH VERIO) test strip Patient needs a new script sent in  For the test strips. They were in the 22nd and forgot to ask for these to be sent      [facility] [place], [place]

## 2017-09-02 ENCOUNTER — Ambulatory Visit: Payer: Medicare HMO | Admitting: Speech Pathology

## 2017-09-02 ENCOUNTER — Ambulatory Visit: Payer: Medicare HMO | Admitting: Occupational Therapy

## 2017-09-02 ENCOUNTER — Encounter: Payer: Self-pay | Admitting: Rehabilitation

## 2017-09-02 ENCOUNTER — Ambulatory Visit: Payer: Medicare HMO | Admitting: Rehabilitation

## 2017-09-02 DIAGNOSIS — R2681 Unsteadiness on feet: Secondary | ICD-10-CM | POA: Diagnosis not present

## 2017-09-02 DIAGNOSIS — R482 Apraxia: Secondary | ICD-10-CM | POA: Diagnosis not present

## 2017-09-02 DIAGNOSIS — I69318 Other symptoms and signs involving cognitive functions following cerebral infarction: Secondary | ICD-10-CM

## 2017-09-02 DIAGNOSIS — R278 Other lack of coordination: Secondary | ICD-10-CM | POA: Diagnosis not present

## 2017-09-02 DIAGNOSIS — R414 Neurologic neglect syndrome: Secondary | ICD-10-CM

## 2017-09-02 DIAGNOSIS — I69354 Hemiplegia and hemiparesis following cerebral infarction affecting left non-dominant side: Secondary | ICD-10-CM

## 2017-09-02 DIAGNOSIS — R41841 Cognitive communication deficit: Secondary | ICD-10-CM

## 2017-09-02 DIAGNOSIS — R29818 Other symptoms and signs involving the nervous system: Secondary | ICD-10-CM | POA: Diagnosis not present

## 2017-09-02 DIAGNOSIS — R41842 Visuospatial deficit: Secondary | ICD-10-CM

## 2017-09-02 DIAGNOSIS — R2689 Other abnormalities of gait and mobility: Secondary | ICD-10-CM

## 2017-09-02 DIAGNOSIS — R208 Other disturbances of skin sensation: Secondary | ICD-10-CM

## 2017-09-02 NOTE — Patient Instructions (Signed)
  1. Look for the edge of objects (to the left and/or right) so that you make sure you are seeing all of an object 2. Turn your head when walking, scan from side to side, particularly in busy environments 3. Use an organized scanning pattern. It's usually easier to scan from top to bottom, and left to right (like you are reading) 4. Double check yourself 5. Use a line guide (like a blank piece of paper) or your finger when reading 6. If necessary, place brightly colored tape at end of table or work area as a reminder to always look until you see the tape.    Activities to try at home to encourage visual scanning:   1. Word searches 2. Mazes 3. Puzzles 4. Card games 5. Computer games and/or searches 6. Connect-the-dots  Activities for environmental (larger) scanning:  1. With supervision, scan for items in grocery store or drugstore.  Begin with a familiar store, then progress to a new store you've never been in before. Make sure you have supervision with this.  2. With supervision, tell a family member or caregiver when it is safe to cross a street after looking all directions and any side streets. However, do NOT cross street unless family member or caregiver is with you and says it is OK

## 2017-09-02 NOTE — Therapy (Signed)
[facility] [address] [place], [place], [address] Phone: [phone]   Fax:  [phone]  Speech Language Pathology Treatment  Patient Details  Name: Eric Long MRN: [id number] Date of Birth: May 19, 1940 Referring Provider: Dr. Alysia Long   Encounter Date: 09/02/2017  End of Session - 09/02/17 1210    Visit Number  3    Number of Visits  17    Date for SLP Re-Evaluation  10/10/17    Authorization Type  60 total visits - if PT/OT/ST are on 1 day, it counts as 1 visit    SLP Start Time  1017    SLP Stop Time   1102    SLP Time Calculation (min)  45 min    Activity Tolerance  Patient tolerated treatment well       Past Medical History:  Diagnosis Date  . Diabetes mellitus without complication ([place])   . Hyperlipidemia     Past Surgical History:  Procedure Laterality Date  . FEMORAL-FEMORAL BYPASS GRAFT Bilateral 2007    There were no vitals filed for this visit.         ADULT SLP TREATMENT - 09/02/17 1051      General Information   Behavior/Cognition  Alert;Cooperative      Treatment Provided   Treatment provided  Cognitive-Linquistic      Pain Assessment   Pain Assessment  No/denies pain      Cognitive-Linquistic Treatment   Treatment focused on  Cognition    Skilled Treatment  Daughter, Eric Long, requested to speak with me alone Home managment: Eric Long with questions re: how to carryover cognitive activities at home and how to improve her father's independence. I trained her on use of questions and observation statements to cue her dad. Instructed her on modifications of card games and board games. Daughter reports that when her father doesn't have success, his whole day is off. Educated her to stop task when frustration begins. (9 minutes) Facilitated selective attention, recall with cues and reasoning with card sort with significant extended time, mod verbal and visual cues. Mildly  distracting environment with occasional min A to return to sort after distraction. Instructed pt to bring his notebook each session, as he did not bring HW back. Trained pt/family on use of visual sign at door to remind pt to bring his notebook .       Assessment / Recommendations / Plan   Plan  Continue with current plan of care      Progression Toward Goals   Progression toward goals  Progressing toward goals       SLP Education - 09/02/17 1152    Education provided  Yes    Education Details  strategies for cueing pt for problem solving and attention at home; cognitive exercises to be done daily    Person(s) Educated  Patient;Child(ren);Spouse    Methods  Explanation;Verbal cues;Handout;Demonstration    Comprehension  Verbalized understanding;Need further instruction       SLP Short Term Goals - 09/02/17 1209      SLP SHORT TERM GOAL #1   Title  Pt will selectively attend to mildly complex cognitive linguistic task with distractions and occasional min redirection    Baseline  09/02/17;    Time  3    Period  Weeks    Status  On-going      SLP SHORT TERM GOAL #2   Title  Pt will complete milldy complex organization, reasoning, problem sovling tasks with  85% accuracy and occasional min A    Time  3    Period  Weeks    Status  On-going      SLP SHORT TERM GOAL #3   Title  Pt will ID errors and self correct 3/5 errors with occasional min A    Time  3    Period  Weeks    Status  On-going      SLP SHORT TERM GOAL #4   Title  Pt will utilize external aids for medication, schedule management with occasional min A from family outside of therapy over 2 sessions    Time  3    Period  Weeks    Status  On-going       SLP Long Term Goals - 09/02/17 1210      SLP LONG TERM GOAL #1   Title  Pt will alternate attention between 2 mildly complex cognitive linguistic tasksk with 85% on each and occasional min A    Time  7    Period  Weeks    Status  On-going      SLP LONG TERM  GOAL #2   Title  Pt will self correct errors 4/5 errors with occasional min A    Time  7    Period  Weeks    Status  On-going      SLP LONG TERM GOAL #3   Title  Pt will recall details from paragraph, attending to the left, with 80% accuracy and occasional min A    Time  7    Period  Weeks    Status  On-going       Plan - 09/02/17 1205    Clinical Impression Statement  Mr. Eric Long continues to exhibit significant deficits in attention, memory, reasoning and organization. Overall affect is flat, with minimal verbalizations. He requires encouragement to participate in simple converasation. Continue skilled ST to maximize safety and reduce caregiver burden.     Speech Therapy Frequency  2x / week    Treatment/Interventions  Environmental controls;Cueing hierarchy;SLP instruction and feedback;Cognitive reorganization;Functional tasks;Compensatory strategies;Internal/external aids;Patient/family education    Potential to Achieve Goals  Good    Consulted and Agree with Plan of Care  Patient;Family member/caregiver    Family Member Consulted  spouse, daughter, Eric Long       Patient will benefit from skilled therapeutic intervention in order to improve the following deficits and impairments:   Cognitive communication deficit    Problem List Patient Active Problem List   Diagnosis Date Noted  . Acute ischemic right MCA stroke ([place]) 07/26/2017  . Hyperlipidemia 07/23/2017  . Diastolic dysfunction   . Acute blood loss anemia   . Essential hypertension 07/22/2017  . Type 2 diabetes mellitus with renal manifestations ([place]) 07/22/2017  . Acute CVA (cerebrovascular accident) ([place]) 07/21/2017  . Chronic kidney disease, stage III (moderate) ([place]) 02/19/2013  . Atherosclerotic peripheral vascular disease ([place]) 02/19/2013  . Pure hypercholesterolemia 02/19/2013  . Diabetes mellitus, stable ([place]) 02/05/2013    Eric Long, Annye Rusk MS, [place] 09/02/2017, 12:11 PM  [facility] [address] [place], [place], [address] Phone: [phone]   Fax:  [phone]   Name: Eric Long MRN: [id number] Date of Birth: [birth year]

## 2017-09-02 NOTE — Therapy (Signed)
[facility] [address] [place], [place], [address] Phone: [phone]   Fax:  [phone]  Occupational Therapy Treatment  Patient Details  Name: Eric Long MRN: [id number] Date of Birth: [birth year] Referring Provider: Dr. Letta Pate   Encounter Date: 09/02/2017  OT End of Session - 09/02/17 0901    Visit Number  3    Number of Visits  16    Date for OT Re-Evaluation  10/07/17    Authorization Type  medicare    Authorization Time Period  90 days    OT Start Time  0800    OT Stop Time  0845    OT Time Calculation (min)  45 min    Activity Tolerance  Patient tolerated treatment well       Past Medical History:  Diagnosis Date  . Diabetes mellitus without complication ([place])   . Hyperlipidemia     Past Surgical History:  Procedure Laterality Date  . FEMORAL-FEMORAL BYPASS GRAFT Bilateral 2007    There were no vitals filed for this visit.  Subjective Assessment - 09/02/17 0805    Subjective   I don't trust what I'm seeing     Patient is accompained by:  Family member spouse and daughter    Pertinent History  R MCA CVA, acute encephalopathy, severe L carotid stenosis.      Patient Stated Goals  back to where I am stable with walking, use the computer, continue reading and writing.     Currently in Pain?  No/denies        Treatment:   Tabletop scanning attempting to match digital and analogue times (simple side) but pt required max cueing, therefore adapted task to only find missing digital times - pt was asked to find the missing times from a group of times provided for: visual scanning, attention to Lt side, selective attention (in min distracting gym), working memory, and cognition. Pt required min to mod cueing.  Therapist educated wife/daughter in how to incorporate visual scanning and attention into functional tasks at home and reviewed handouts provided. Therapist did instruct family not to have pt  go with them to grocery store yet, however, until balance was better.  Pt cued for safety when ambulating into gym to use quad cane. Pt had LOB x 1 and was not using quad cane properly. Informed P.T.                    OT Education - 09/02/17 0859    Education provided  Yes    Education Details  visual scanning strategies, activities to encourage visual scanning    Person(s) Educated  Patient;Spouse;Child(ren)    Methods  Explanation;Handout    Comprehension  Verbalized understanding family   family      OT Short Term Goals - 08/26/17 1311      OT SHORT TERM GOAL #1   Title  Pt and family will be mod I for home activities program for L field cut/L neglect - 09/09/2017    Status  On-going      OT SHORT TERM GOAL #2   Title  Assessed 9 hole peg and determine goal if appropriate.    Status  Achieved see PN      OT SHORT TERM GOAL #3   Title  Assess Box and blocks and determine goal if appropriate    Status  Achieved L= 38      OT SHORT TERM GOAL #4   Title  Pt will be supevision for UB dressing     Status  On-going      OT SHORT TERM GOAL #5   Title  Pt will need no more than 3 vc's for clothing orientation when dressing.    Status  On-going      OT SHORT TERM GOAL #6   Title  Pt will be min a, mod cues for LB dressing    Status  On-going      OT SHORT TERM GOAL #7   Title  Pt will locate items during functional tasks on L with no more than min cues     Status  On-going        OT Long Term Goals - 08/26/17 1311      OT LONG TERM GOAL #1   Title  Pt and family will be mod I upgraded HEP prn - 10/07/2017    Status  On-going      OT LONG TERM GOAL #2   Title  Pt will demonstrate envrionmental scanning during functional task with at least 90% accuracy    Status  On-going      OT LONG TERM GOAL #3   Title  Pt will be able to complete bilateral tasks with no more than min cues to attend to L hand    Status  On-going      OT LONG TERM GOAL #4   Title   Pt will be mod I with eating    Status  On-going      OT LONG TERM GOAL #5   Title  Pt will be supervision for simple yard tasks.    Status  Deferred at pts request      OT LONG TERM GOAL #6   Title  Pt will be mod I with dressing      OT LONG TERM GOAL #7   Title  Pt will be mod I with toilet and shower transfers    Status  New      OT LONG TERM GOAL #8   Title  Pt will be able to read simple paragraph with compensations and min cueing    Status  New            Plan - 09/02/17 0901    Clinical Impression Statement  Pt with decreased near body awareness and attention to Lt side. Pt also with cognitive deficits and decr. insight into deficits    Occupational Profile and client history currently impacting functional performance  PMH:  DM, HTN, HLD, chronic kidney disease, PVD, severe L extracranial carotid stenosis.      Occupational performance deficits (Please refer to evaluation for details):  ADL's;IADL's;Rest and Sleep;Leisure;Social Participation    Rehab Potential  Good    OT Frequency  2x / week    OT Duration  8 weeks    OT Treatment/Interventions  Self-care/ADL training;Aquatic Therapy;Electrical Stimulation;DME and/or AE instruction;Neuromuscular education;Therapeutic exercise;Functional Mobility Training;Manual Therapy;Therapeutic activities;Cognitive remediation/compensation;Balance training;Patient/family education;Visual/perceptual remediation/compensation    Plan  assess motor planning further with familiar functional tasks, environmental scanning, reading, NMR for LUE functional use and mobility    Consulted and Agree with Plan of Care  Patient;Family member/caregiver    Family Member Consulted  wife and daughter       Patient will benefit from skilled therapeutic intervention in order to improve the following deficits and impairments:  Abnormal gait, Decreased activity tolerance, Decreased balance, Decreased coordination, Decreased cognition, Decreased knowledge  of use of DME, Decreased mobility, Decreased  safety awareness, Difficulty walking, Impaired UE functional use, Impaired sensation, Impaired vision/preception  Visit Diagnosis: Hemiplegia and hemiparesis following cerebral infarction affecting left non-dominant side (HCC)  Left-sided neglect  Visuospatial deficit  Other symptoms and signs involving cognitive functions following cerebral infarction  Unsteadiness on feet    Problem List Patient Active Problem List   Diagnosis Date Noted  . Acute ischemic right MCA stroke ([place]) 07/26/2017  . Hyperlipidemia 07/23/2017  . Diastolic dysfunction   . Acute blood loss anemia   . Essential hypertension 07/22/2017  . Type 2 diabetes mellitus with renal manifestations ([place]) 07/22/2017  . Acute CVA (cerebrovascular accident) ([place]) 07/21/2017  . Chronic kidney disease, stage III (moderate) ([place]) 02/19/2013  . Atherosclerotic peripheral vascular disease ([place]) 02/19/2013  . Pure hypercholesterolemia 02/19/2013  . Diabetes mellitus, stable ([place]) 02/05/2013    Carey Bullocks, OTR/L 09/02/2017, 9:23 AM  [facility] [address] [place], [place], [address] Phone: [phone]   Fax:  [phone]  Name: NICOLIS BOODY MRN: [id number] Date of Birth: Feb 18, 1941

## 2017-09-02 NOTE — Therapy (Signed)
[facility] [address] [place], [place], [address] Phone: [phone]   Fax:  [phone]  Physical Therapy Treatment  Patient Details  Name: Eric Long MRN: [id number] Date of Birth: 1941-04-14 Referring Provider: Dr. Letta Pate   Encounter Date: 09/02/2017  PT End of Session - 09/02/17 0857    Visit Number  3    Number of Visits  17    Date for PT Re-Evaluation  10/11/17    Authorization Type  [employer]    PT Start Time  [phone]    PT Stop Time  0932    PT Time Calculation (min)  45 min    Equipment Utilized During Treatment  Gait belt    Activity Tolerance  Patient tolerated treatment well    Behavior During Therapy  [facility] for tasks assessed/performed;Flat affect       Past Medical History:  Diagnosis Date  . Diabetes mellitus without complication ([place])   . Hyperlipidemia     Past Surgical History:  Procedure Laterality Date  . FEMORAL-FEMORAL BYPASS GRAFT Bilateral 2007    There were no vitals filed for this visit.  Subjective Assessment - 09/02/17 0854    Subjective  No falls, no changes.     Patient is accompained by:  Family member wife [name]), daughter York Grice)    Pertinent History  DM, HLD, PVD, CKD (stage III)    Patient Stated Goals  Return to IND living, walk without concern, build up stamina     Currently in Pain?  No/denies                       [facility] Adult PT Treatment/Exercise - 09/02/17 0909      Ambulation/Gait   Ambulation/Gait  Yes    Ambulation/Gait Assistance  4: Min guard    Ambulation/Gait Assistance Details  Pt ambulatory from OT table to therapy mat with SBQC.  Note very wide BOS, increased time to complete task, inability to place cane in appropriate position despite cues.  Feel that sequencing gait with cane is difficult at this time.  Assessed gait with use of RW during session.  Note that pt did much better narrowing BOS with min cues on straight  path, more mod cues during turns.  Intermittent assist to steer and guide RW with cues for pt to scan environment.  Performed x 2 laps and note that on second lap, pt required less assist to steer RW, min cues during turns to scan environment to the L and also to ensure L foot remained inside of RW, but over looks safer and more fluid than use of SBQC.  Also assessed gait with use of L Reaction AFO to see if this assisted with not only foot clearance, but due to brace running medially did it assist with decreasing LLE external rotation.  Did not note any difference with use of AFO at this time, will continue to monitor for need of AFO if pt catches L toe, esp with fatigue.  Wife and daughter feel that he is safer with use of RW as well and plan to purchase one today.  Recommend they bring to next session and we will continue to work on safety with RW, esp when making turns. Pt and family verbalized understanding.      Ambulation Distance (Feet)  115 Feet x 2 reps    Assistive device  Small based quad cane;Rolling walker    Gait Pattern  Step-through pattern;Decreased  stride length;Decreased dorsiflexion - left;Decreased weight shift to left;Decreased trunk rotation L foot ER    Ambulation Surface  Level;Indoor    Gait Comments  Pts family with questions regarding use of stairs with RW.  Daughter had picture of stairs and note that two stairs to enter home are very short stairs and feel that he could easily place walker at top of stairs with practice and use rail to get to walker.  Pt and family verbalized understanding.        Neuro Re-ed    Neuro Re-ed Details   At end of session, wanted to add single exercise for strengthening and equal WB.  Did so with sit<>stand.  Pt able to do from mat without UE support.  During first rep, note he requires cues for scooting to edge of mat and proper placement of feet.  However note that as soon as he loaded LEs, he would move L foot into abd position from opposite LE  and also increase ER of foot.  PT moved to L side of pt to place her foot beside of pts foot and this prevented pt moving foot during transition.  Pt did much better with standing, however note that once in stand, he tends to increase R lateral weight shift. cues to shift L and sit slowly.  Provided education for family to do this at home from his chair in the living room (daughter had picture and it seemed approx same height as mat).               PT Education - 09/02/17 0948    Education provided  Yes    Education Details  Use of RW vs SBQC, encouraging L visual field scanning (also allowing pt to safely bump into things on L to force L scanning-planned failure), sit<>stand for HEP    Person(s) Educated  Patient    Methods  Explanation    Comprehension  Verbalized understanding       PT Short Term Goals - 08/26/17 1109      PT SHORT TERM GOAL #1   Title  Pt will perform HEP with S to improve balance, endurance, strength, and safety. TARGET DATE FOR ALL STGS: 09/09/17    Status  New      PT SHORT TERM GOAL #2   Title  Pt will perform TUG with LRAD, with S,  in </=25 sec. to decr. falls risk    Status  New      PT SHORT TERM GOAL #3   Title  Pt will improve gait speed with LRAD to >/=1.73ft/sec. to decr. falls risk.     Status  New      PT SHORT TERM GOAL #4   Title  Perform BERG and write STG and LTG as indicated.     Status  Achieved      PT SHORT TERM GOAL #5   Title  Pt will amb. 300' over even terrain with LRAD and S to improve safety during functional mobility.     Status  New      Additional Short Term Goals   Additional Short Term Goals  Yes      PT SHORT TERM GOAL #6   Title  Pt will improve BERG score to >/=35/56 to decr. falls risk.     Status  New        PT Long Term Goals - 08/26/17 1110      PT LONG TERM GOAL #1  Title  Pt will verbalize understanding of CVA risk factors and s/s to decr. risk of CVA. (TARGET DATE FOR ALL LTGS: 10/07/17)    Status  New       PT LONG TERM GOAL #2   Title  Pt will be IND with HEP to improve strength, safety, balance, and endurance.     Status  New      PT LONG TERM GOAL #3   Title  Pt will perform TUG with LRAD, at MOD I level, in </=13.5 sec. to decr. falls risk.     Status  New      PT LONG TERM GOAL #4   Title  Pt will improve gait speed to >/=2.43ft/sec. with LRAD to amb. safely in the community.     Status  New      PT LONG TERM GOAL #5   Title  Pt will amb. 600' over uneven terrain with LRAD at MOD I level to improve functional mobility and safety.     Status  New      Additional Long Term Goals   Additional Long Term Goals  Yes      PT LONG TERM GOAL #6   Title  Pt will improve BERG score to >/=39/56 to decr. falls risk.     Status  New            Plan - 09/02/17 7322    Clinical Impression Statement  Skilled session focused on assessment of gait with RW and RW with AFO (Reaction) for LLE.  Note that pt is much more fluid with use of RW.  He does need cues to scan L environment, but made improvements within session and feel that with continued practice would improve safety and also improve use of LUE and attention to the L.  Pt and wife/daughter verbalized understanding and agreement and will plan to purchase RW today.  Recommend they bring to next session to continue practice.     Rehab Potential  Good    Clinical Impairments Affecting Rehab Potential  see above.     PT Frequency  2x / week    PT Duration  8 weeks    PT Treatment/Interventions  ADLs/Self Care Home Management;Biofeedback;Canalith Repostioning;Electrical Stimulation;Therapeutic exercise;Therapeutic activities;Manual techniques;Wheelchair mobility training;Vestibular;Functional mobility training;Orthotic Fit/Training;Stair training;Gait training;Patient/family education;Cognitive remediation;DME Instruction;Neuromuscular re-education;Balance training    PT Next Visit Plan  Check sit<>stand from last session, add more  strengthening and flexibility to HEP as able, gait with RW (they are supposed to get and bring to next session) esp turns.    Consulted and Agree with Plan of Care  Patient;Family member/caregiver    Family Member Consulted  pt's wife ([name]?), [name], [name]: dtr       Patient will benefit from skilled therapeutic intervention in order to improve the following deficits and impairments:  Abnormal gait, Decreased endurance, Impaired sensation, Postural dysfunction, Impaired flexibility, Impaired tone, Decreased coordination, Decreased safety awareness, Decreased cognition, Decreased range of motion, Dizziness, Decreased balance, Decreased mobility, Decreased activity tolerance, Decreased knowledge of use of DME, Decreased strength, Impaired UE functional use  Visit Diagnosis: Hemiplegia and hemiparesis following cerebral infarction affecting left non-dominant side (HCC)  Other lack of coordination  Other abnormalities of gait and mobility  Other disturbances of skin sensation     Problem List Patient Active Problem List   Diagnosis Date Noted  . Acute ischemic right MCA stroke ([place]) 07/26/2017  . Hyperlipidemia 07/23/2017  . Diastolic dysfunction   . Acute blood loss  anemia   . Essential hypertension 07/22/2017  . Type 2 diabetes mellitus with renal manifestations ([place]) 07/22/2017  . Acute CVA (cerebrovascular accident) ([place]) 07/21/2017  . Chronic kidney disease, stage III (moderate) ([place]) 02/19/2013  . Atherosclerotic peripheral vascular disease ([place]) 02/19/2013  . Pure hypercholesterolemia 02/19/2013  . Diabetes mellitus, stable ([place]) 02/05/2013    Cameron Sprang, PT, MPT [facility] [address] [place], [place], [address] Phone: [phone]   Fax:  [phone] 09/02/17, 9:59 AM  Name: Eric Long MRN: [id number] Date of Birth: May 17, 1940

## 2017-09-02 NOTE — Patient Instructions (Addendum)
    Put a note on your door to grab therapy notebook  Bring therapy notebook to PT/OT/ST  Consider a daily chart or white board where you can check off after you have done your PT/OT and ST  (brain) exercises  Cognitive Activities you can do at home:   - Solitaire  - [place]  - Chess/Checkers  - Crosswords (easy level)  - [place]  - jigsaw

## 2017-09-02 NOTE — Patient Instructions (Signed)
Sit to Stand: Head Upright    With head upright, stand up slowly with eyes open.  Keep left foot straight (family can sit beside you and help your foot maintain position).  Try to stand without using hands.  Lean forward and sit slowly.   Repeat _10___ times per session. Do _2___ sessions per day.  Copyright  VHI. All rights reserved.

## 2017-09-03 ENCOUNTER — Other Ambulatory Visit: Payer: Self-pay

## 2017-09-03 MED ORDER — GLUCOSE BLOOD VI STRP: ORAL_STRIP | 12 refills | Status: DC

## 2017-09-04 ENCOUNTER — Encounter: Payer: Self-pay | Admitting: Speech Pathology

## 2017-09-04 ENCOUNTER — Ambulatory Visit: Payer: Medicare HMO | Admitting: Speech Pathology

## 2017-09-04 ENCOUNTER — Encounter: Payer: Self-pay | Admitting: Occupational Therapy

## 2017-09-04 ENCOUNTER — Ambulatory Visit: Payer: Medicare HMO | Admitting: Occupational Therapy

## 2017-09-04 ENCOUNTER — Ambulatory Visit: Payer: Medicare HMO | Attending: Physical Medicine & Rehabilitation

## 2017-09-04 DIAGNOSIS — R414 Neurologic neglect syndrome: Secondary | ICD-10-CM | POA: Insufficient documentation

## 2017-09-04 DIAGNOSIS — R208 Other disturbances of skin sensation: Secondary | ICD-10-CM | POA: Insufficient documentation

## 2017-09-04 DIAGNOSIS — R29818 Other symptoms and signs involving the nervous system: Secondary | ICD-10-CM

## 2017-09-04 DIAGNOSIS — R41842 Visuospatial deficit: Secondary | ICD-10-CM | POA: Diagnosis not present

## 2017-09-04 DIAGNOSIS — R2689 Other abnormalities of gait and mobility: Secondary | ICD-10-CM | POA: Insufficient documentation

## 2017-09-04 DIAGNOSIS — R41841 Cognitive communication deficit: Secondary | ICD-10-CM | POA: Diagnosis present

## 2017-09-04 DIAGNOSIS — R2681 Unsteadiness on feet: Secondary | ICD-10-CM | POA: Insufficient documentation

## 2017-09-04 DIAGNOSIS — I69354 Hemiplegia and hemiparesis following cerebral infarction affecting left non-dominant side: Secondary | ICD-10-CM | POA: Diagnosis not present

## 2017-09-04 DIAGNOSIS — R482 Apraxia: Secondary | ICD-10-CM | POA: Diagnosis not present

## 2017-09-04 DIAGNOSIS — I69318 Other symptoms and signs involving cognitive functions following cerebral infarction: Secondary | ICD-10-CM

## 2017-09-04 DIAGNOSIS — R278 Other lack of coordination: Secondary | ICD-10-CM

## 2017-09-04 NOTE — Therapy (Signed)
[facility] [address] [place], [place], [address] Phone: [phone]   Fax:  [phone]  Speech Language Pathology Treatment  Patient Details  Name: Eric Long MRN: [id number] Date of Birth: Aug 18, 1940 Referring Provider: Dr. Alysia Penna   Encounter Date: 09/04/2017  End of Session - 09/04/17 1458    Visit Number  4    Number of Visits  17    Date for SLP Re-Evaluation  10/10/17    Authorization Type  60 total visits - if PT/OT/ST are on 1 day, it counts as 1 visit    SLP Start Time  1315    SLP Stop Time   1358    SLP Time Calculation (min)  43 min    Activity Tolerance  Patient tolerated treatment well       Past Medical History:  Diagnosis Date  . Diabetes mellitus without complication ([place])   . Hyperlipidemia     Past Surgical History:  Procedure Laterality Date  . FEMORAL-FEMORAL BYPASS GRAFT Bilateral 2007    There were no vitals filed for this visit.         ADULT SLP TREATMENT - 09/04/17 1326      General Information   Behavior/Cognition  Alert;Cooperative      Treatment Provided   Treatment provided  Cognitive-Linquistic      Pain Assessment   Pain Assessment  No/denies pain      Cognitive-Linquistic Treatment   Treatment focused on  Cognition    Skilled Treatment  Pt arrives with binder with charts for PT/OT/Cognitive exercise charts, homework and HEPs all in notebook. Facilitated selective attention with functional math reasoning tasks with extended time, mild distractors. Pt attended to tasks despite mininmal distractions. He required extended time and usual min to mod A for reasoning/organizing and attending to details in instructions. Pt required usual mod A to ID and correct errors. Pt and family instructed to supervise and cue pt to organize his meds on Sunday for the week      Assessment / Recommendations / [place] with current plan of care      Progression Toward Goals   Progression toward goals  Progressing toward goals         SLP Short Term Goals - 09/04/17 1457      SLP SHORT TERM GOAL #1   Title  Pt will selectively attend to mildly complex cognitive linguistic task with distractions and occasional min redirection    Baseline  09/02/17; 09/04/17    Time  3    Period  Weeks    Status  On-going      SLP SHORT TERM GOAL #2   Title  Pt will complete milldy complex organization, reasoning, problem sovling tasks with 85% accuracy and occasional min A    Time  3    Period  Weeks    Status  On-going      SLP SHORT TERM GOAL #3   Title  Pt will ID errors and self correct 3/5 errors with occasional min A    Time  3    Period  Weeks    Status  On-going      SLP SHORT TERM GOAL #4   Title  Pt will utilize external aids for medication, schedule management with occasional min A from family outside of therapy over 2 sessions    Time  3    Period  Weeks    Status  On-going  SLP Long Term Goals - 09/04/17 1458      SLP LONG TERM GOAL #1   Title  Pt will alternate attention between 2 mildly complex cognitive linguistic tasksk with 85% on each and occasional min A    Time  7    Period  Weeks    Status  On-going      SLP LONG TERM GOAL #2   Title  Pt will self correct errors 4/5 errors with occasional min A    Time  7    Period  Weeks    Status  On-going      SLP LONG TERM GOAL #3   Title  Pt will recall details from paragraph, attending to the left, with 80% accuracy and occasional min A    Time  7    Period  Weeks    Status  On-going       Plan - 09/04/17 1457    Clinical Impression Statement  Mr. Lafoe continues to exhibit significant deficits in attention, memory, reasoning and organization. Overall affect is flat, with minimal verbalizations. He requires encouragement to participate in simple converasation. Continue skilled ST to maximize safety and reduce caregiver burden.     Treatment/Interventions   Environmental controls;Cueing hierarchy;SLP instruction and feedback;Cognitive reorganization;Functional tasks;Compensatory strategies;Internal/external aids;Patient/family education    Potential to Achieve Goals  Good    Consulted and Agree with Plan of Care  Patient;Family member/caregiver    Family Member Consulted  spouse, daughter, Dewaine Oats       Patient will benefit from skilled therapeutic intervention in order to improve the following deficits and impairments:   Cognitive communication deficit    Problem List Patient Active Problem List   Diagnosis Date Noted  . Acute ischemic right MCA stroke ([place]) 07/26/2017  . Hyperlipidemia 07/23/2017  . Diastolic dysfunction   . Acute blood loss anemia   . Essential hypertension 07/22/2017  . Type 2 diabetes mellitus with renal manifestations ([place]) 07/22/2017  . Acute CVA (cerebrovascular accident) ([place]) 07/21/2017  . Chronic kidney disease, stage III (moderate) ([place]) 02/19/2013  . Atherosclerotic peripheral vascular disease ([place]) 02/19/2013  . Pure hypercholesterolemia 02/19/2013  . Diabetes mellitus, stable ([place]) 02/05/2013    [place], Annye Rusk MS, CCC-SLP 09/04/2017, 2:59 PM  [facility] [address] [place], [place], [address] Phone: [phone]   Fax:  [phone]   Name: Eric Long MRN: [id number] Date of Birth: [birth year]

## 2017-09-04 NOTE — Patient Instructions (Signed)
Sit to Stand: Head Upright    With head upright, stand up slowly with eyes open.  Keep left foot straight (family can sit beside you and help your foot maintain position).  Try to stand without using hands.  Lean forward and sit slowly.   Repeat _10___ times per session. Do _2___ sessions per day.  Copyright  VHI. All rights reserved.

## 2017-09-04 NOTE — Therapy (Signed)
[facility] [address] [place], [place], [address] Phone: [phone]   Fax:  [phone]  Occupational Therapy Treatment  Patient Details  Name: Eric Long MRN: [id number] Date of Birth: [birth year] Referring Provider: Dr. Letta Pate   Encounter Date: 09/04/2017  OT End of Session - 09/04/17 1630    Visit Number  4    Number of Visits  16    Date for OT Re-Evaluation  10/07/17    Authorization Type  medicare    Authorization Time Period  90 days    OT Start Time  1446    OT Stop Time  1531    OT Time Calculation (min)  45 min    Activity Tolerance  Patient tolerated treatment well       Past Medical History:  Diagnosis Date  . Diabetes mellitus without complication ([place])   . Hyperlipidemia     Past Surgical History:  Procedure Laterality Date  . FEMORAL-FEMORAL BYPASS GRAFT Bilateral 2007    There were no vitals filed for this visit.  Subjective Assessment - 09/04/17 1450    Subjective   I think I read better using those guides.    Patient is accompained by:  Family member wife and dtr    Pertinent History  R MCA CVA, acute encephalopathy, severe L carotid stenosis.      Patient Stated Goals  back to where I am stable with walking, use the computer, continue reading and writing.     Currently in Pain?  No/denies                   OT Treatments/Exercises (OP) - 09/04/17 1613      ADLs   UB Dressing  Addressed UB dressing with pullover short sleeved shirt.  Pt able to doff mod I.  Pt required increased time to problem solve finding front, back, top and bottom as well as where sleves and neck opening were.  Once given time, pt was able to problem solve without physical assistance.  Discussed that performance likely varies from day to day however encouraged wife and dtr to allow pt time to problem solve and then to start with questioning cues first.  Demonsrated diffierence between  questioning cue vs directive cueing.  Dtr and wife both verbalized understanding and will attempt to implement at home.       Visual/Perceptual Exercises   Visual Motor Integration  Addressed reading using reading guides. Pt with signficant improvement in ability to organize reading using guides - assisted pt in moving guide so that pt could focus on reading.  WIth guides pt able to demonstrate improvement in comprehension.  Information provided in writing in how to obtain guides and dtr to follow up.  Dtr has also agreed to work with pt at home using guides.               OT Education - 09/04/17 1618    Education provided  Yes    Education Details  reading guides, dressing strategies, cueing hierarchy    Person(s) Educated  Patient;Spouse;Child(ren)    Methods  Explanation;Demonstration;Handout    Comprehension  Verbalized understanding;Returned demonstration       OT Short Term Goals - 09/04/17 1619      OT SHORT TERM GOAL #1   Title  Pt and family will be mod I for home activities program for L field cut/L neglect - 09/09/2017    Status  On-going  OT SHORT TERM GOAL #2   Title  Assessed 9 hole peg and determine goal if appropriate.    Status  Achieved see PN      OT SHORT TERM GOAL #3   Title  Assess Box and blocks and determine goal if appropriate    Status  Achieved L= 38      OT SHORT TERM GOAL #4   Title  Pt will be supevision for UB dressing     Status  On-going      OT SHORT TERM GOAL #5   Title  Pt will need no more than 3 vc's for clothing orientation when dressing.    Status  On-going      OT SHORT TERM GOAL #6   Title  Pt will be min a, mod cues for LB dressing    Status  On-going      OT SHORT TERM GOAL #7   Title  Pt will locate items during functional tasks on L with no more than min cues     Status  On-going        OT Long Term Goals - 09/04/17 1621      OT LONG TERM GOAL #1   Title  Pt and family will be mod I upgraded HEP prn - 10/07/2017     Status  On-going      OT LONG TERM GOAL #2   Title  Pt will demonstrate envrionmental scanning during functional task with at least 90% accuracy    Status  On-going      OT LONG TERM GOAL #3   Title  Pt will be able to complete bilateral tasks with no more than min cues to attend to L hand    Status  On-going      OT LONG TERM GOAL #4   Title  Pt will be mod I with eating    Status  On-going      OT LONG TERM GOAL #5   Title  Pt will be supervision for simple yard tasks.    Status  Deferred at pts request      OT LONG TERM GOAL #6   Title  Pt will be mod I with dressing      OT LONG TERM GOAL #7   Title  Pt will be mod I with toilet and shower transfers    Status  On-going      OT LONG TERM GOAL #8   Title  Pt will be able to read simple paragraph with compensations and min cueing    Status  On-going            Plan - 09/04/17 1622    Clinical Impression Statement  Pt progressing toward goals.  Pt improving in basic mobility and basic ADL    Occupational Profile and client history currently impacting functional performance  PMH:  DM, HTN, HLD, chronic kidney disease, PVD, severe L extracranial carotid stenosis.      Occupational performance deficits (Please refer to evaluation for details):  ADL's;IADL's;Rest and Sleep;Leisure;Social Participation    Rehab Potential  Good    OT Frequency  2x / week    OT Duration  8 weeks    OT Treatment/Interventions  Self-care/ADL training;Aquatic Therapy;Electrical Stimulation;DME and/or AE instruction;Neuromuscular education;Therapeutic exercise;Functional Mobility Training;Manual Therapy;Therapeutic activities;Cognitive remediation/compensation;Balance training;Patient/family education;Visual/perceptual remediation/compensation    Plan  NMR for  motor planning with  familiar functional tasks, environmental scanning, reading, NMR for LUE functional use and mobility, LB dressing  Consulted and Agree with Plan of Care   Patient;Family member/caregiver    Family Member Consulted  wife and daughter       Patient will benefit from skilled therapeutic intervention in order to improve the following deficits and impairments:  Abnormal gait, Decreased activity tolerance, Decreased balance, Decreased coordination, Decreased cognition, Decreased knowledge of use of DME, Decreased mobility, Decreased safety awareness, Difficulty walking, Impaired UE functional use, Impaired sensation, Impaired vision/preception  Visit Diagnosis: Hemiplegia and hemiparesis following cerebral infarction affecting left non-dominant side (HCC)  Other lack of coordination  Left-sided neglect  Visuospatial deficit  Other symptoms and signs involving cognitive functions following cerebral infarction  Unsteadiness on feet  Other disturbances of skin sensation  Apraxia  Other symptoms and signs involving the nervous system    Problem List Patient Active Problem List   Diagnosis Date Noted  . Acute ischemic right MCA stroke ([place]) 07/26/2017  . Hyperlipidemia 07/23/2017  . Diastolic dysfunction   . Acute blood loss anemia   . Essential hypertension 07/22/2017  . Type 2 diabetes mellitus with renal manifestations ([place]) 07/22/2017  . Acute CVA (cerebrovascular accident) ([place]) 07/21/2017  . Chronic kidney disease, stage III (moderate) ([place]) 02/19/2013  . Atherosclerotic peripheral vascular disease ([place]) 02/19/2013  . Pure hypercholesterolemia 02/19/2013  . Diabetes mellitus, stable ([place]) 02/05/2013    Sherryll Burger 09/04/2017, 4:32 PM  [facility] [address] [place], [place], [address] Phone: [phone]   Fax:  [phone]  Name: Eric Long MRN: [id number] Date of Birth: 30-Nov-1940

## 2017-09-04 NOTE — Therapy (Signed)
[facility] [address] [place], [place], [address] Phone: [phone]   Fax:  [phone]  Physical Therapy Treatment  Patient Details  Name: Eric Long MRN: [id number] Date of Birth: August 19, 1940 Referring Provider: Dr. Letta Pate   Encounter Date: 09/04/2017  PT End of Session - 09/04/17 1454    Visit Number  4    Number of Visits  17    Date for PT Re-Evaluation  10/11/17    Authorization Type  [employer]    PT Start Time  1403    PT Stop Time  1445    PT Time Calculation (min)  42 min    Equipment Utilized During Treatment  -- min guard to S    Activity Tolerance  Patient tolerated treatment well    Behavior During Therapy  [facility] for tasks assessed/performed       Past Medical History:  Diagnosis Date  . Diabetes mellitus without complication ([place])   . Hyperlipidemia     Past Surgical History:  Procedure Laterality Date  . FEMORAL-FEMORAL BYPASS GRAFT Bilateral 2007    There were no vitals filed for this visit.  Subjective Assessment - 09/04/17 1408    Subjective  Pt denied falls since last visit. Pt reported Dr. Harrington Challenger stopped Iron pill 2/2 pt having difficulty going to the bathroom. Pt brought in new walker today.     Patient is accompained by:  Family member    Pertinent History  DM, HLD, PVD, CKD (stage III)    Patient Stated Goals  Return to IND living, walk without concern, build up stamina     Currently in Pain?  No/denies                       [facility] Adult PT Treatment/Exercise - 09/04/17 1417      Transfers   Transfers  Sit to Stand;Stand to Sit    Sit to Stand  5: Supervision;With upper extremity assist;From chair/3-in-1    Sit to Stand Details  Tactile cues for sequencing;Verbal cues for sequencing;Verbal cues for technique;Verbal cues for precautions/safety    Stand to Sit  5: Supervision;With upper extremity assist;To chair/3-in-1    Stand to Sit Details (indicate  cue type and reason)  Tactile cues for sequencing;Tactile cues for initiation;Verbal cues for sequencing;Verbal cues for technique;Verbal cues for precautions/safety    Comments  PT reviewed STS txf HEP, as pt did not recall performing at home (nor did family).       Ambulation/Gait   Ambulation/Gait  Yes    Ambulation/Gait Assistance  4: Min guard    Ambulation/Gait Assistance Details  Pt amb. with new RW with cues to improve extreme L foot ER, upright posture, sequencing, and to stay within RW. Pt did require some assist to steer RW. Cues to attend to L side.     Ambulation Distance (Feet)  115 Feet x2. 20'x2    Assistive device  Rolling walker    Gait Pattern  Step-through pattern;Decreased stride length;Decreased dorsiflexion - left;Decreased weight shift to left;Decreased trunk rotation    Ambulation Surface  Level;Indoor    Gait velocity - backwards  2x10' with cues and demo for technique. Min guard and one UE support for safety.              PT Education - 09/04/17 1453    Education provided  Yes    Education Details  Reviewed and printed STS HEP (including using foot  to block L foot ER).     Person(s) Educated  Patient;Child(ren);Spouse    Methods  Explanation;Tactile cues;Demonstration;Verbal cues;Handout    Comprehension  Returned demonstration;Verbalized understanding       PT Short Term Goals - 08/26/17 1109      PT SHORT TERM GOAL #1   Title  Pt will perform HEP with S to improve balance, endurance, strength, and safety. TARGET DATE FOR ALL STGS: 09/09/17    Status  New      PT SHORT TERM GOAL #2   Title  Pt will perform TUG with LRAD, with S,  in </=25 sec. to decr. falls risk    Status  New      PT SHORT TERM GOAL #3   Title  Pt will improve gait speed with LRAD to >/=1.73ft/sec. to decr. falls risk.     Status  New      PT SHORT TERM GOAL #4   Title  Perform BERG and write STG and LTG as indicated.     Status  Achieved      PT SHORT TERM GOAL #5   Title   Pt will amb. 300' over even terrain with LRAD and S to improve safety during functional mobility.     Status  New      Additional Short Term Goals   Additional Short Term Goals  Yes      PT SHORT TERM GOAL #6   Title  Pt will improve BERG score to >/=35/56 to decr. falls risk.     Status  New        PT Long Term Goals - 08/26/17 1110      PT LONG TERM GOAL #1   Title  Pt will verbalize understanding of CVA risk factors and s/s to decr. risk of CVA. (TARGET DATE FOR ALL LTGS: 10/07/17)    Status  New      PT LONG TERM GOAL #2   Title  Pt will be IND with HEP to improve strength, safety, balance, and endurance.     Status  New      PT LONG TERM GOAL #3   Title  Pt will perform TUG with LRAD, at MOD I level, in </=13.5 sec. to decr. falls risk.     Status  New      PT LONG TERM GOAL #4   Title  Pt will improve gait speed to >/=2.26ft/sec. with LRAD to amb. safely in the community.     Status  New      PT LONG TERM GOAL #5   Title  Pt will amb. 600' over uneven terrain with LRAD at MOD I level to improve functional mobility and safety.     Status  New      Additional Long Term Goals   Additional Long Term Goals  Yes      PT LONG TERM GOAL #6   Title  Pt will improve BERG score to >/=39/56 to decr. falls risk.     Status  New            Plan - 09/04/17 1454    Clinical Impression Statement  Today's skilled session focused on assessing pt's gait with new RW. PT adjusted height to improve posture and sequencing. Pt continues to require cues for sequencing, decr. L foot ER, and steering RW. However, pt progressed to requiring less cues to attend to L side, and progressed to min gaurd to S during last 20' of amb.  PT also trialed retro gait to improve weight shifting to L LE and extensor muscle activation. Pt would continue to benefit from skilled PT to improve safety during functional mobility.     Rehab Potential  Good    Clinical Impairments Affecting Rehab Potential  see  above.     PT Frequency  2x / week    PT Duration  8 weeks    PT Treatment/Interventions  ADLs/Self Care Home Management;Biofeedback;Canalith Repostioning;Electrical Stimulation;Therapeutic exercise;Therapeutic activities;Manual techniques;Wheelchair mobility training;Vestibular;Functional mobility training;Orthotic Fit/Training;Stair training;Gait training;Patient/family education;Cognitive remediation;DME Instruction;Neuromuscular re-education;Balance training    PT Next Visit Plan   add more strengthening and flexibility to HEP as able, gait with RW,esp turns.    Consulted and Agree with Plan of Care  Patient;Family member/caregiver    Family Member Consulted  pt's wife ([name]?), [name], [name]: dtr       Patient will benefit from skilled therapeutic intervention in order to improve the following deficits and impairments:  Abnormal gait, Decreased endurance, Impaired sensation, Postural dysfunction, Impaired flexibility, Impaired tone, Decreased coordination, Decreased safety awareness, Decreased cognition, Decreased range of motion, Dizziness, Decreased balance, Decreased mobility, Decreased activity tolerance, Decreased knowledge of use of DME, Decreased strength, Impaired UE functional use  Visit Diagnosis: Hemiplegia and hemiparesis following cerebral infarction affecting left non-dominant side (HCC)  Other lack of coordination  Other abnormalities of gait and mobility     Problem List Patient Active Problem List   Diagnosis Date Noted  . Acute ischemic right MCA stroke ([place]) 07/26/2017  . Hyperlipidemia 07/23/2017  . Diastolic dysfunction   . Acute blood loss anemia   . Essential hypertension 07/22/2017  . Type 2 diabetes mellitus with renal manifestations ([place]) 07/22/2017  . Acute CVA (cerebrovascular accident) ([place]) 07/21/2017  . Chronic kidney disease, stage III (moderate) ([place]) 02/19/2013  . Atherosclerotic peripheral vascular disease ([place]) 02/19/2013  . Pure  hypercholesterolemia 02/19/2013  . Diabetes mellitus, stable ([place]) 02/05/2013    Miller,Jennifer L 09/04/2017, 2:58 PM  [facility] [address] [place], [place], [address] Phone: [phone]   Fax:  [phone]  Name: Eric Long MRN: [id number] Date of Birth: [birth year]  Geoffry Paradise, PT,DPT 09/04/17 2:58 PM Phone: [phone] Fax: [phone]

## 2017-09-07 DIAGNOSIS — I639 Cerebral infarction, unspecified: Secondary | ICD-10-CM | POA: Diagnosis not present

## 2017-09-08 ENCOUNTER — Encounter: Payer: Self-pay | Admitting: Occupational Therapy

## 2017-09-08 ENCOUNTER — Ambulatory Visit: Payer: Medicare HMO | Admitting: Speech Pathology

## 2017-09-08 ENCOUNTER — Ambulatory Visit: Payer: Medicare HMO

## 2017-09-08 ENCOUNTER — Ambulatory Visit: Payer: Medicare HMO | Admitting: Occupational Therapy

## 2017-09-08 DIAGNOSIS — R414 Neurologic neglect syndrome: Secondary | ICD-10-CM | POA: Diagnosis not present

## 2017-09-08 DIAGNOSIS — R482 Apraxia: Secondary | ICD-10-CM

## 2017-09-08 DIAGNOSIS — I69354 Hemiplegia and hemiparesis following cerebral infarction affecting left non-dominant side: Secondary | ICD-10-CM

## 2017-09-08 DIAGNOSIS — R2681 Unsteadiness on feet: Secondary | ICD-10-CM | POA: Diagnosis not present

## 2017-09-08 DIAGNOSIS — R2689 Other abnormalities of gait and mobility: Secondary | ICD-10-CM

## 2017-09-08 DIAGNOSIS — I69318 Other symptoms and signs involving cognitive functions following cerebral infarction: Secondary | ICD-10-CM

## 2017-09-08 DIAGNOSIS — R29818 Other symptoms and signs involving the nervous system: Secondary | ICD-10-CM

## 2017-09-08 DIAGNOSIS — R278 Other lack of coordination: Secondary | ICD-10-CM | POA: Diagnosis not present

## 2017-09-08 DIAGNOSIS — R208 Other disturbances of skin sensation: Secondary | ICD-10-CM

## 2017-09-08 DIAGNOSIS — R41841 Cognitive communication deficit: Secondary | ICD-10-CM

## 2017-09-08 DIAGNOSIS — R41842 Visuospatial deficit: Secondary | ICD-10-CM | POA: Diagnosis not present

## 2017-09-08 NOTE — Therapy (Signed)
[facility] [address] [place], [place], [address] Phone: [phone]   Fax:  [phone]  Occupational Therapy Treatment  Patient Details  Name: Eric Long MRN: [id number] Date of Birth: Mar 06, 1941 Referring Provider: Dr. Letta Pate   Encounter Date: 09/08/2017  OT End of Session - 09/08/17 1723    Visit Number  5    Number of Visits  16    Date for OT Re-Evaluation  10/07/17    Authorization Type  medicare    Authorization Time Period  90 days    OT Start Time  6283    OT Stop Time  1400    OT Time Calculation (min)  43 min    Activity Tolerance  Patient tolerated treatment well       Past Medical History:  Diagnosis Date  . Diabetes mellitus without complication ([place])   . Hyperlipidemia     Past Surgical History:  Procedure Laterality Date  . FEMORAL-FEMORAL BYPASS GRAFT Bilateral 2007    There were no vitals filed for this visit.  Subjective Assessment - 09/08/17 1321    Subjective   I tried some reading this weekend - I think it went ok I'm not sure    Patient is accompained by:  Family member wife    Pertinent History  R MCA CVA, acute encephalopathy, severe L carotid stenosis.      Patient Stated Goals  back to where I am stable with walking, use the computer, continue reading and writing.     Currently in Pain?  No/denies                   OT Treatments/Exercises (OP) - 09/08/17 0001      Neurological Re-education Exercises   Other Exercises 1  Utilized card game to address numerous skills including functional use and awareness of LUE, scanning to the L, incoporation of cognition including selective and alternating attention, simple memory, simple problem solving.  Also addressed functional ambuation with walker as well as motor planning and perceptual skills getting into and out of a chair. Wife present and encouraged wife to use card game at home to address numerous skills. Wife  verbalized understanding.                OT Short Term Goals - 09/08/17 1722      OT SHORT TERM GOAL #1   Title  Pt and family will be mod I for home activities program for L field cut/L neglect - 09/09/2017    Status  On-going      OT SHORT TERM GOAL #2   Title  Assessed 9 hole peg and determine goal if appropriate.    Status  Achieved see PN      OT SHORT TERM GOAL #3   Title  Assess Box and blocks and determine goal if appropriate    Status  Achieved L= 38      OT SHORT TERM GOAL #4   Title  Pt will be supevision for UB dressing     Status  On-going      OT SHORT TERM GOAL #5   Title  Pt will need no more than 3 vc's for clothing orientation when dressing.    Status  On-going      OT SHORT TERM GOAL #6   Title  Pt will be min a, mod cues for LB dressing    Status  On-going      OT SHORT  TERM GOAL #7   Title  Pt will locate items during functional tasks on L with no more than min cues     Status  On-going        OT Long Term Goals - 09/08/17 1722      OT LONG TERM GOAL #1   Title  Pt and family will be mod I upgraded HEP prn - 10/07/2017    Status  On-going      OT LONG TERM GOAL #2   Title  Pt will demonstrate envrionmental scanning during functional task with at least 90% accuracy    Status  On-going      OT LONG TERM GOAL #3   Title  Pt will be able to complete bilateral tasks with no more than min cues to attend to L hand    Status  On-going      OT LONG TERM GOAL #4   Title  Pt will be mod I with eating    Status  On-going      OT LONG TERM GOAL #5   Title  Pt will be supervision for simple yard tasks.    Status  Deferred at pts request      OT LONG TERM GOAL #6   Title  Pt will be mod I with dressing      OT LONG TERM GOAL #7   Title  Pt will be mod I with toilet and shower transfers    Status  On-going      OT LONG TERM GOAL #8   Title  Pt will be able to read simple paragraph with compensations and min cueing    Status  On-going             Plan - 09/08/17 1722    Clinical Impression Statement  Pt progressing toward goals. Pt able to complete simple functional activity today with moderate cueing and extra time.     Occupational Profile and client history currently impacting functional performance  PMH:  DM, HTN, HLD, chronic kidney disease, PVD, severe L extracranial carotid stenosis.      Occupational performance deficits (Please refer to evaluation for details):  ADL's;IADL's;Rest and Sleep;Leisure;Social Participation    Rehab Potential  Good    OT Frequency  2x / week    OT Duration  8 weeks    OT Treatment/Interventions  Self-care/ADL training;Aquatic Therapy;Electrical Stimulation;DME and/or AE instruction;Neuromuscular education;Therapeutic exercise;Functional Mobility Training;Manual Therapy;Therapeutic activities;Cognitive remediation/compensation;Balance training;Patient/family education;Visual/perceptual remediation/compensation    Plan  NMR for  motor planning with  familiar functional tasks, environmental scanning, reading, NMR for LUE functional use and mobility, LB dressing    Consulted and Agree with Plan of Care  Patient;Family member/caregiver    Family Member Consulted  wife        Patient will benefit from skilled therapeutic intervention in order to improve the following deficits and impairments:  Abnormal gait, Decreased activity tolerance, Decreased balance, Decreased coordination, Decreased cognition, Decreased knowledge of use of DME, Decreased mobility, Decreased safety awareness, Difficulty walking, Impaired UE functional use, Impaired sensation, Impaired vision/preception  Visit Diagnosis: Hemiplegia and hemiparesis following cerebral infarction affecting left non-dominant side (HCC)  Other lack of coordination  Unsteadiness on feet  Left-sided neglect  Visuospatial deficit  Other symptoms and signs involving cognitive functions following cerebral infarction  Other disturbances of  skin sensation  Apraxia  Other symptoms and signs involving the nervous system    Problem List Patient Active Problem List   Diagnosis Date Noted  .  Acute ischemic right MCA stroke ([place]) 07/26/2017  . Hyperlipidemia 07/23/2017  . Diastolic dysfunction   . Acute blood loss anemia   . Essential hypertension 07/22/2017  . Type 2 diabetes mellitus with renal manifestations ([place]) 07/22/2017  . Acute CVA (cerebrovascular accident) ([place]) 07/21/2017  . Chronic kidney disease, stage III (moderate) ([place]) 02/19/2013  . Atherosclerotic peripheral vascular disease ([place]) 02/19/2013  . Pure hypercholesterolemia 02/19/2013  . Diabetes mellitus, stable ([place]) 02/05/2013    Quay Burow , OTR/L 09/08/2017, 5:26 PM  [facility] [address] [place], [place], [address] Phone: [phone]   Fax:  [phone]  Name: ZEBEDIAH BEEZLEY MRN: [id number] Date of Birth: 26-Apr-1941

## 2017-09-08 NOTE — Therapy (Signed)
[facility] [address] [place], [place], [address] Phone: [phone]   Fax:  [phone]  Speech Language Pathology Treatment  Patient Details  Name: Eric Long MRN: [id number] Date of Birth: 1940-09-08 Referring Provider: Dr. Alysia Penna   Encounter Date: 09/08/2017  End of Session - 09/08/17 1738    Visit Number  5    Number of Visits  17    Date for SLP Re-Evaluation  10/10/17    SLP Start Time  6269    SLP Stop Time   1533    SLP Time Calculation (min)  48 min    Activity Tolerance  Patient tolerated treatment well;Other (comment) limited by frustration at end of session       Past Medical History:  Diagnosis Date  . Diabetes mellitus without complication ([place])   . Hyperlipidemia     Past Surgical History:  Procedure Laterality Date  . FEMORAL-FEMORAL BYPASS GRAFT Bilateral 2007    There were no vitals filed for this visit.  Subjective Assessment - 09/08/17 1449    Subjective  "I got a homework assignment."    Patient is accompained by:  Family member    Currently in Pain?  No/denies            ADULT SLP TREATMENT - 09/08/17 1445      General Information   Behavior/Cognition  Alert;Cooperative      Treatment Provided   Treatment provided  Cognitive-Linquistic      Pain Assessment   Pain Assessment  No/denies pain      Cognitive-Linquistic Treatment   Treatment focused on  Cognition    Skilled Treatment  Pt took out homework for SLP; charts highly disorganized and pt unable to locate information. SLP worked with pt to problem solve and plan for better chart organization; pt told SLP categories to create. Pt then began inputing information into the chart with extended time (extremely slow processing, alternating attention impaired). Pt to continue at home. Pt's wife present and states family still assisting with medications. SLP facilitated functional attention, problem solving by  having pt locate appropriate slots on a pill slot when provided with dates, times; 100% accuracy, extended time and min A question cues required. Wife observed and SLP demonstrated cuing via questions; SLP encouraged her to do this with pt at home at med times. In simple organization task Engineer, agricultural), cues required for attention in mildly distracting environment; mod-max A for simple reasoning. Pt with decreased task persistence, frustration tolerance; suspect that end of session and fact that ST pt's 3rd appointment of the day impacting. Educated re: recognizing when to take a break, encouraged pt to reattempt at home.      Assessment / Recommendations / Plan   Plan  Continue with current plan of care      Progression Toward Goals   Progression toward goals  Progressing toward goals         SLP Short Term Goals - 09/08/17 1739      SLP SHORT TERM GOAL #1   Title  Pt will selectively attend to mildly complex cognitive linguistic task with distractions and occasional min redirection    Time  2    Period  Weeks    Status  On-going      SLP SHORT TERM GOAL #2   Title  Pt will complete milldy complex organization, reasoning, problem sovling tasks with 85% accuracy and occasional min A    Time  2  Period  Weeks    Status  On-going      SLP SHORT TERM GOAL #3   Title  Pt will ID errors and self correct 3/5 errors with occasional min A    Time  2    Period  Weeks    Status  On-going      SLP SHORT TERM GOAL #4   Title  Pt will utilize external aids for medication, schedule management with occasional min A from family outside of therapy over 2 sessions    Time  2    Period  Weeks    Status  On-going       SLP Long Term Goals - 09/08/17 1739      SLP LONG TERM GOAL #1   Title  Pt will alternate attention between 2 mildly complex cognitive linguistic tasksk with 85% on each and occasional min A    Time  6    Period  Weeks    Status  On-going      SLP LONG TERM GOAL  #2   Title  Pt will self correct errors 4/5 errors with occasional min A    Time  6    Period  Weeks    Status  On-going      SLP LONG TERM GOAL #3   Time  6    Period  Weeks    Status  On-going       Plan - 09/08/17 1738    Clinical Impression Statement  Eric Long continues to exhibit significant deficits in attention, memory, reasoning and organization. Overall affect is flat, with minimal verbalizations. He requires encouragement to participate in simple converasation. Continue skilled ST to maximize safety and reduce caregiver burden.     Speech Therapy Frequency  2x / week    Treatment/Interventions  Environmental controls;Cueing hierarchy;SLP instruction and feedback;Cognitive reorganization;Functional tasks;Compensatory strategies;Internal/external aids;Patient/family education    Potential to Achieve Goals  Good    Consulted and Agree with Plan of Care  Patient;Family member/caregiver    Family Member Consulted  spouse       Patient will benefit from skilled therapeutic intervention in order to improve the following deficits and impairments:   Cognitive communication deficit    Problem List Patient Active Problem List   Diagnosis Date Noted  . Acute ischemic right MCA stroke ([place]) 07/26/2017  . Hyperlipidemia 07/23/2017  . Diastolic dysfunction   . Acute blood loss anemia   . Essential hypertension 07/22/2017  . Type 2 diabetes mellitus with renal manifestations ([place]) 07/22/2017  . Acute CVA (cerebrovascular accident) ([place]) 07/21/2017  . Chronic kidney disease, stage III (moderate) ([place]) 02/19/2013  . Atherosclerotic peripheral vascular disease ([place]) 02/19/2013  . Pure hypercholesterolemia 02/19/2013  . Diabetes mellitus, stable ([place]) 02/05/2013   Deneise Lever, [place], [place] 09/08/2017, 5:40 PM  [facility] [address] [place], [place], [address] Phone:  [phone]   Fax:  [phone]   Name: Eric Long MRN: [id number] Date of Birth: May 07, 1940

## 2017-09-08 NOTE — Patient Instructions (Signed)
Bridge    Lie back, legs bent. Tuck in stomach, life hips up towards ceiling. Hold for 2 seconds, then slowly lower back down. Repeat __10__ times. Do 3 sets per session. Do __3__ sessions per week.  http://pm.exer.us/54   Copyright  VHI. All rights reserved.

## 2017-09-08 NOTE — Therapy (Signed)
[facility] [address] [place], [place], [address] Phone: [phone]   Fax:  [phone]  Physical Therapy Treatment  Patient Details  Name: Eric Long MRN: [id number] Date of Birth: [birth year] Referring Provider: Dr. Letta Pate   Encounter Date: 09/08/2017  PT End of Session - 09/08/17 1450    Visit Number  5    Number of Visits  17    Date for PT Re-Evaluation  10/11/17    Authorization Type  [employer]    PT Start Time  1405 with OT    PT Stop Time  1444    PT Time Calculation (min)  39 min    Equipment Utilized During Treatment  -- min guard to S prn    Activity Tolerance  Patient tolerated treatment well    Behavior During Therapy  [facility] for tasks assessed/performed       Past Medical History:  Diagnosis Date  . Diabetes mellitus without complication ([place])   . Hyperlipidemia     Past Surgical History:  Procedure Laterality Date  . FEMORAL-FEMORAL BYPASS GRAFT Bilateral 2007    There were no vitals filed for this visit.  Subjective Assessment - 09/08/17 1415    Subjective  Pt denied falls or changes since last visit. Pt would like to go shopping this afternoon at [place]. PT will wait to assess goals until next week, as pt missed a week of appt's 2/2 schedules were full.     Patient is accompained by:  Family member wife    Pertinent History  DM, HLD, PVD, CKD (stage III)    Patient Stated Goals  Return to IND living, walk without concern, build up stamina     Currently in Pain?  No/denies                       [facility] Adult PT Treatment/Exercise - 09/08/17 1453      Ambulation/Gait   Ambulation/Gait  Yes    Ambulation/Gait Assistance  4: Min guard    Ambulation/Gait Assistance Details  Pt amb. with RW, cues to improve L foot placement, sequencing with RW (especially around turns/corners), and incr. time to problem solve with minimal cues. Cues to stay on blue path vs. veering to  R side and to attend to L side. Seated rest breaks after amb. 2/2 fatigue and incr. Gait impairments.    Ambulation Distance (Feet)  50 Feet 75'    Assistive device  Rolling walker    Gait Pattern  Step-through pattern;Decreased stride length;Decreased dorsiflexion - left;Decreased weight shift to left;Decreased trunk rotation    Ambulation Surface  Level;Indoor       Therex: Pt performed bridges 3x10 reps with cues for technique. Pt's wife assisted pt during last 10 reps to ensure proper technique at home. Please see pt instructions for HEP details. Pt with noted L UE flexion pattern during bridges, which pt ceased with cues. Min A to keep LLE in line with L hip/shoulders. No pain reported.       PT Education - 09/08/17 1449    Education provided  Yes    Education Details  PT adding bridges to HEP and educated pt's wife on how to cue pt during HEP. PT discussed holding on assessing STGs until next week, as pt missed a full week of PT 2/2 scheduling issues.     Person(s) Educated  Patient;Spouse    Methods  Explanation;Demonstration;Tactile cues;Verbal cues;Handout    Comprehension  Returned demonstration;Verbalized understanding       PT Short Term Goals - 08/26/17 1109      PT SHORT TERM GOAL #1   Title  Pt will perform HEP with S to improve balance, endurance, strength, and safety. TARGET DATE FOR ALL STGS: 09/09/17    Status  New      PT SHORT TERM GOAL #2   Title  Pt will perform TUG with LRAD, with S,  in </=25 sec. to decr. falls risk    Status  New      PT SHORT TERM GOAL #3   Title  Pt will improve gait speed with LRAD to >/=1.57ft/sec. to decr. falls risk.     Status  New      PT SHORT TERM GOAL #4   Title  Perform BERG and write STG and LTG as indicated.     Status  Achieved      PT SHORT TERM GOAL #5   Title  Pt will amb. 300' over even terrain with LRAD and S to improve safety during functional mobility.     Status  New      Additional Short Term Goals    Additional Short Term Goals  Yes      PT SHORT TERM GOAL #6   Title  Pt will improve BERG score to >/=35/56 to decr. falls risk.     Status  New        PT Long Term Goals - 08/26/17 1110      PT LONG TERM GOAL #1   Title  Pt will verbalize understanding of CVA risk factors and s/s to decr. risk of CVA. (TARGET DATE FOR ALL LTGS: 10/07/17)    Status  New      PT LONG TERM GOAL #2   Title  Pt will be IND with HEP to improve strength, safety, balance, and endurance.     Status  New      PT LONG TERM GOAL #3   Title  Pt will perform TUG with LRAD, at MOD I level, in </=13.5 sec. to decr. falls risk.     Status  New      PT LONG TERM GOAL #4   Title  Pt will improve gait speed to >/=2.79ft/sec. with LRAD to amb. safely in the community.     Status  New      PT LONG TERM GOAL #5   Title  Pt will amb. 600' over uneven terrain with LRAD at MOD I level to improve functional mobility and safety.     Status  New      Additional Long Term Goals   Additional Long Term Goals  Yes      PT LONG TERM GOAL #6   Title  Pt will improve BERG score to >/=39/56 to decr. falls risk.     Status  New            Plan - 09/08/17 1450    Clinical Impression Statement  Today's skilled session continues to focus on gait training with RW. Pt continues to require minimal, concise cues for sequencing and safety with RW. Pt continues to require the most assist while traversing corners/turns with RW. Pt able to problem solve with incr. time after cues. Pt noted to demo improved L foot ER and fluidity of gait with "just walk" cue, as pt states he has to "overthink" about everything. PT provided pt with bridges to improve extensor muscle strength. Continue with  POC.     Rehab Potential  Good    Clinical Impairments Affecting Rehab Potential  see above.     PT Frequency  2x / week    PT Duration  8 weeks    PT Treatment/Interventions  ADLs/Self Care Home Management;Biofeedback;Canalith  Repostioning;Electrical Stimulation;Therapeutic exercise;Therapeutic activities;Manual techniques;Wheelchair mobility training;Vestibular;Functional mobility training;Orthotic Fit/Training;Stair training;Gait training;Patient/family education;Cognitive remediation;DME Instruction;Neuromuscular re-education;Balance training    PT Next Visit Plan   add  flexibility to HEP as able (later), continue gait with RW,esp turns.    Consulted and Agree with Plan of Care  Patient;Family member/caregiver    Family Member Consulted  pt's wife ([name]?), [name], [name]: dtr       Patient will benefit from skilled therapeutic intervention in order to improve the following deficits and impairments:  Abnormal gait, Decreased endurance, Impaired sensation, Postural dysfunction, Impaired flexibility, Impaired tone, Decreased coordination, Decreased safety awareness, Decreased cognition, Decreased range of motion, Dizziness, Decreased balance, Decreased mobility, Decreased activity tolerance, Decreased knowledge of use of DME, Decreased strength, Impaired UE functional use  Visit Diagnosis: Hemiplegia and hemiparesis following cerebral infarction affecting left non-dominant side (HCC)  Other lack of coordination  Unsteadiness on feet  Other abnormalities of gait and mobility     Problem List Patient Active Problem List   Diagnosis Date Noted  . Acute ischemic right MCA stroke ([place]) 07/26/2017  . Hyperlipidemia 07/23/2017  . Diastolic dysfunction   . Acute blood loss anemia   . Essential hypertension 07/22/2017  . Type 2 diabetes mellitus with renal manifestations ([place]) 07/22/2017  . Acute CVA (cerebrovascular accident) ([place]) 07/21/2017  . Chronic kidney disease, stage III (moderate) ([place]) 02/19/2013  . Atherosclerotic peripheral vascular disease ([place]) 02/19/2013  . Pure hypercholesterolemia 02/19/2013  . Diabetes mellitus, stable ([place]) 02/05/2013    Shontavia Mickel L 09/08/2017, 2:54 PM  [facility] [address] [place], [place], [address] Phone: [phone]   Fax:  [phone]  Name: Eric Long MRN: [id number] Date of Birth: 09-19-1940  Geoffry Paradise, PT,DPT 09/08/17 2:57 PM Phone: [phone] Fax: [phone]

## 2017-09-10 ENCOUNTER — Other Ambulatory Visit: Payer: Self-pay

## 2017-09-10 ENCOUNTER — Telehealth: Payer: Self-pay | Admitting: Endocrinology

## 2017-09-10 DIAGNOSIS — R69 Illness, unspecified: Secondary | ICD-10-CM | POA: Diagnosis not present

## 2017-09-10 MED ORDER — GLUCOSE BLOOD VI STRP: ORAL_STRIP | 3 refills | Status: AC

## 2017-09-10 MED ORDER — GLUCOSE BLOOD VI STRP: ORAL_STRIP | 12 refills | Status: AC

## 2017-09-10 NOTE — Telephone Encounter (Signed)
Medications reordered and patients daughter was notified via detailed voicemail.

## 2017-09-10 NOTE — Telephone Encounter (Signed)
glucose blood (ONETOUCH VERIO) test strip   Patient would like the doctor to send a prescription into the pharmacy for test strips.   Patients daughter would like a call back if this can be done    [place], [place]

## 2017-09-11 ENCOUNTER — Encounter: Payer: Self-pay | Admitting: Speech Pathology

## 2017-09-11 ENCOUNTER — Ambulatory Visit: Payer: Medicare HMO | Admitting: Occupational Therapy

## 2017-09-11 ENCOUNTER — Ambulatory Visit: Payer: Medicare HMO | Admitting: Rehabilitation

## 2017-09-11 ENCOUNTER — Encounter: Payer: Self-pay | Admitting: Rehabilitation

## 2017-09-11 ENCOUNTER — Encounter: Payer: Self-pay | Admitting: Occupational Therapy

## 2017-09-11 ENCOUNTER — Ambulatory Visit: Payer: Medicare HMO | Admitting: Speech Pathology

## 2017-09-11 DIAGNOSIS — R2689 Other abnormalities of gait and mobility: Secondary | ICD-10-CM

## 2017-09-11 DIAGNOSIS — R482 Apraxia: Secondary | ICD-10-CM | POA: Diagnosis not present

## 2017-09-11 DIAGNOSIS — R278 Other lack of coordination: Secondary | ICD-10-CM

## 2017-09-11 DIAGNOSIS — R29818 Other symptoms and signs involving the nervous system: Secondary | ICD-10-CM | POA: Diagnosis not present

## 2017-09-11 DIAGNOSIS — R208 Other disturbances of skin sensation: Secondary | ICD-10-CM | POA: Diagnosis not present

## 2017-09-11 DIAGNOSIS — R41842 Visuospatial deficit: Secondary | ICD-10-CM

## 2017-09-11 DIAGNOSIS — I69354 Hemiplegia and hemiparesis following cerebral infarction affecting left non-dominant side: Secondary | ICD-10-CM

## 2017-09-11 DIAGNOSIS — R41841 Cognitive communication deficit: Secondary | ICD-10-CM

## 2017-09-11 DIAGNOSIS — R2681 Unsteadiness on feet: Secondary | ICD-10-CM

## 2017-09-11 DIAGNOSIS — I69318 Other symptoms and signs involving cognitive functions following cerebral infarction: Secondary | ICD-10-CM

## 2017-09-11 DIAGNOSIS — R414 Neurologic neglect syndrome: Secondary | ICD-10-CM | POA: Diagnosis not present

## 2017-09-11 NOTE — Patient Instructions (Signed)
  Consider digital watch with larger numbers and date ([employer])  Practice counting time/money for attention - making change

## 2017-09-11 NOTE — Therapy (Signed)
[facility] [address] [place], [place], [address] Phone: [phone]   Fax:  [phone]  Speech Language Pathology Treatment  Patient Details  Name: Eric Long MRN: [id number] Date of Birth: [birth year] Referring Provider: Dr. Alysia Penna   Encounter Date: 09/11/2017  End of Session - 09/11/17 1200    Visit Number  6    Number of Visits  17    Date for SLP Re-Evaluation  10/10/17    Authorization Type  60 total visits - if PT/OT/ST are on 1 day, it counts as 1 visit    SLP Start Time  1018    SLP Stop Time   1103    SLP Time Calculation (min)  45 min    Activity Tolerance  Patient tolerated treatment well;Other (comment)       Past Medical History:  Diagnosis Date  . Diabetes mellitus without complication ([place])   . Hyperlipidemia     Past Surgical History:  Procedure Laterality Date  . FEMORAL-FEMORAL BYPASS GRAFT Bilateral 2007    There were no vitals filed for this visit.  Subjective Assessment - 09/11/17 1144    Subjective  "I don't remember the homework" -     Patient is accompained by:  Family member spouse    Currently in Pain?  No/denies            ADULT SLP TREATMENT - 09/11/17 1145      General Information   Behavior/Cognition  Alert;Cooperative      Treatment Provided   Treatment provided  Cognitive-Linquistic      Cognitive-Linquistic Treatment   Treatment focused on  Cognition    Skilled Treatment  Again, pt's homework disorganized/incomplete. Completed this today in session to target attention to detail/organization with frequent max A. Extended time with significant processing delay. Simple time word problems with mod A.       Assessment / Recommendations / Plan   Plan  Continue with current plan of care      Progression Toward Goals   Progression toward goals  Progressing toward goals         SLP Short Term Goals - 09/11/17 1200      SLP SHORT TERM GOAL #1    Title  Pt will selectively attend to mildly complex cognitive linguistic task with distractions and occasional min redirection    Baseline  09/02/17; 09/04/17    Time  2    Period  Weeks    Status  On-going      SLP SHORT TERM GOAL #2   Title  Pt will complete milldy complex organization, reasoning, problem sovling tasks with 85% accuracy and occasional min A    Time  2    Period  Weeks    Status  On-going      SLP SHORT TERM GOAL #3   Title  Pt will ID errors and self correct 3/5 errors with occasional min A    Time  2    Period  Weeks    Status  On-going      SLP SHORT TERM GOAL #4   Title  Pt will utilize external aids for medication, schedule management with occasional min A from family outside of therapy over 2 sessions    Time  2    Period  Weeks    Status  On-going       SLP Long Term Goals - 09/11/17 1200      SLP LONG TERM GOAL #  1   Title  Pt will alternate attention between 2 mildly complex cognitive linguistic tasksk with 85% on each and occasional min A    Time  6    Period  Weeks    Status  On-going      SLP LONG TERM GOAL #2   Title  Pt will self correct errors 4/5 errors with occasional min A    Time  6    Period  Weeks    Status  On-going      SLP LONG TERM GOAL #3   Time  6    Period  Weeks    Status  On-going       Plan - 09/11/17 1157    Clinical Impression Statement  Significant cognitive linguistic deficits persist. Progress in ST affected by severe visual and perceptual impairments (spoke with OT).Continue skilled ST with focus on auditory /verbal tasks and use of visual compensations for visual tasks    Speech Therapy Frequency  2x / week    Treatment/Interventions  Environmental controls;Cueing hierarchy;SLP instruction and feedback;Cognitive reorganization;Functional tasks;Compensatory strategies;Internal/external aids;Patient/family education    Potential to Achieve Goals  Good       Patient will benefit from skilled therapeutic  intervention in order to improve the following deficits and impairments:   Cognitive communication deficit    Problem List Patient Active Problem List   Diagnosis Date Noted  . Acute ischemic right MCA stroke ([place]) 07/26/2017  . Hyperlipidemia 07/23/2017  . Diastolic dysfunction   . Acute blood loss anemia   . Essential hypertension 07/22/2017  . Type 2 diabetes mellitus with renal manifestations ([place]) 07/22/2017  . Acute CVA (cerebrovascular accident) ([place]) 07/21/2017  . Chronic kidney disease, stage III (moderate) ([place]) 02/19/2013  . Atherosclerotic peripheral vascular disease ([place]) 02/19/2013  . Pure hypercholesterolemia 02/19/2013  . Diabetes mellitus, stable ([place]) 02/05/2013    Kristle Wesch, Annye Rusk MS, CCC-SLP 09/11/2017, 12:01 PM  [facility] [address] [place], [place], [address] Phone: [phone]   Fax:  [phone]   Name: Eric Long MRN: [id number] Date of Birth: [birth year]

## 2017-09-11 NOTE — Therapy (Signed)
[facility] [address] [place], [place], [address] Phone: [phone]   Fax:  [phone]  Physical Therapy Treatment  Patient Details  Name: Eric Long MRN: [id number] Date of Birth: [birth year] Referring Provider: Dr. Letta Pate   Encounter Date: 09/11/2017  PT End of Session - 09/11/17 1542    Visit Number  6    Number of Visits  17    Date for PT Re-Evaluation  10/11/17    Authorization Type  [employer]    PT Start Time  1105    PT Stop Time  1153    PT Time Calculation (min)  48 min    Equipment Utilized During Treatment  -- min guard to S prn    Activity Tolerance  Patient tolerated treatment well    Behavior During Therapy  [facility] for tasks assessed/performed       Past Medical History:  Diagnosis Date  . Diabetes mellitus without complication ([place])   . Hyperlipidemia     Past Surgical History:  Procedure Laterality Date  . FEMORAL-FEMORAL BYPASS GRAFT Bilateral 2007    There were no vitals filed for this visit.  Subjective Assessment - 09/11/17 1114    Subjective  Reports no changes.  Per wife and OT, pt has been ambulating around home without any device or cane or walker.     Patient is accompained by:  Family member    Pertinent History  DM, HLD, PVD, CKD (stage III)    Patient Stated Goals  Return to IND living, walk without concern, build up stamina     Currently in Pain?  No/denies                       [facility] Adult PT Treatment/Exercise - 09/11/17 1105      Ambulation/Gait   Ambulation/Gait  Yes    Ambulation/Gait Assistance  5: Supervision;4: Min guard    Ambulation/Gait Assistance Details  Pt and wife report he has been walking around house with no AD, or with cane or with walker.  He often leaves AD where he walks to and ambulates back without device.  Wife is not with him when he walks in home.  PT spent most of session having pt ambulate without AD around track and  also through tight spaces to assess safety.  Pt overall at S to min/guard level (min/guard through very tight spaces and for 2 very mild LOB) therefore PT okayed him to ambulate around home without device but wife has to be right with him (by his left side and slightly behind him) as she tends to walk ahead of pt.  Also recommend he continue to walk with RW when outside or in community for increased safety.  Again, PT stressed that wife needs to be slightly behind him to the L as she continued to want to stay in front of him when leaving session.  Pt states he is ambulating to restroom at night without AD as well.  Sometimes he is barefoot and sometimes he has on socks.  PT recommended he get some slip on shoes with rubber bottom so that he can quickly get them on ambulate to restroom as he has to go into hallway and bathroom that is tile/hardwood from carpeted bedroom.  This is best bathroom for him to use as he has less turns (and 2 right turns) to get to restroom.  Pt and spouse verbalized understanding.  Ambulation Distance (Feet)  400 Feet    Assistive device  Rolling walker;None    Gait Pattern  Step-through pattern;Decreased stride length;Decreased dorsiflexion - left;Decreased weight shift to left;Decreased trunk rotation    Ambulation Surface  Level;Indoor      Neuro Re-ed    Neuro Re-ed Details   Ended session with stepping task over orange barriers.  Performed step to fashion then alternating to address coordination and proprioception deficits.  Pt requires max time to sequence through task, however is able to perform with min A.  Performed x 5 reps each way.               PT Education - 09/11/17 1542    Education provided  Yes    Education Details  see gait section regarding recommendations for walker use    Person(s) Educated  Patient;Spouse    Methods  Explanation    Comprehension  Verbalized understanding       PT Short Term Goals - 08/26/17 1109      PT SHORT TERM GOAL  #1   Title  Pt will perform HEP with S to improve balance, endurance, strength, and safety. TARGET DATE FOR ALL STGS: 09/09/17    Status  New      PT SHORT TERM GOAL #2   Title  Pt will perform TUG with LRAD, with S,  in </=25 sec. to decr. falls risk    Status  New      PT SHORT TERM GOAL #3   Title  Pt will improve gait speed with LRAD to >/=1.55ft/sec. to decr. falls risk.     Status  New      PT SHORT TERM GOAL #4   Title  Perform BERG and write STG and LTG as indicated.     Status  Achieved      PT SHORT TERM GOAL #5   Title  Pt will amb. 300' over even terrain with LRAD and S to improve safety during functional mobility.     Status  New      Additional Short Term Goals   Additional Short Term Goals  Yes      PT SHORT TERM GOAL #6   Title  Pt will improve BERG score to >/=35/56 to decr. falls risk.     Status  New        PT Long Term Goals - 08/26/17 1110      PT LONG TERM GOAL #1   Title  Pt will verbalize understanding of CVA risk factors and s/s to decr. risk of CVA. (TARGET DATE FOR ALL LTGS: 10/07/17)    Status  New      PT LONG TERM GOAL #2   Title  Pt will be IND with HEP to improve strength, safety, balance, and endurance.     Status  New      PT LONG TERM GOAL #3   Title  Pt will perform TUG with LRAD, at MOD I level, in </=13.5 sec. to decr. falls risk.     Status  New      PT LONG TERM GOAL #4   Title  Pt will improve gait speed to >/=2.65ft/sec. with LRAD to amb. safely in the community.     Status  New      PT LONG TERM GOAL #5   Title  Pt will amb. 600' over uneven terrain with LRAD at MOD I level to improve functional mobility and safety.  Status  New      Additional Long Term Goals   Additional Long Term Goals  Yes      PT LONG TERM GOAL #6   Title  Pt will improve BERG score to >/=39/56 to decr. falls risk.     Status  New            Plan - 09/11/17 1542    Clinical Impression Statement  Skilled session focused on ambulation  without device as he is continuously doing at home.  He is able to perform at S to min/guard level, therefore recommend wife be right by his side (to the L) when he is up moving without RW.  Recommend RW for community/outdoor use.  Both verbalized understanding, however I feel that wife will need re-infocement on staying right by pts side.     Rehab Potential  Good    Clinical Impairments Affecting Rehab Potential  see above.     PT Frequency  2x / week    PT Duration  8 weeks    PT Treatment/Interventions  ADLs/Self Care Home Management;Biofeedback;Canalith Repostioning;Electrical Stimulation;Therapeutic exercise;Therapeutic activities;Manual techniques;Wheelchair mobility training;Vestibular;Functional mobility training;Orthotic Fit/Training;Stair training;Gait training;Patient/family education;Cognitive remediation;DME Instruction;Neuromuscular re-education;Balance training    PT Next Visit Plan  check goals, add  flexibility to HEP as able (later), continue gait with RW,esp turns.  Gait without device unless outdoors    Consulted and Agree with Plan of Care  Patient;Family member/caregiver    Family Member Consulted  pt's wife ([name]?), [name], [name]: dtr       Patient will benefit from skilled therapeutic intervention in order to improve the following deficits and impairments:  Abnormal gait, Decreased endurance, Impaired sensation, Postural dysfunction, Impaired flexibility, Impaired tone, Decreased coordination, Decreased safety awareness, Decreased cognition, Decreased range of motion, Dizziness, Decreased balance, Decreased mobility, Decreased activity tolerance, Decreased knowledge of use of DME, Decreased strength, Impaired UE functional use  Visit Diagnosis: Unsteadiness on feet  Other abnormalities of gait and mobility  Other lack of coordination  Hemiplegia and hemiparesis following cerebral infarction affecting left non-dominant side [facility])     Problem List Patient  Active Problem List   Diagnosis Date Noted  . Acute ischemic right MCA stroke ([place]) 07/26/2017  . Hyperlipidemia 07/23/2017  . Diastolic dysfunction   . Acute blood loss anemia   . Essential hypertension 07/22/2017  . Type 2 diabetes mellitus with renal manifestations ([place]) 07/22/2017  . Acute CVA (cerebrovascular accident) ([place]) 07/21/2017  . Chronic kidney disease, stage III (moderate) ([place]) 02/19/2013  . Atherosclerotic peripheral vascular disease ([place]) 02/19/2013  . Pure hypercholesterolemia 02/19/2013  . Diabetes mellitus, stable ([place]) 02/05/2013   Cameron Sprang, PT, MPT [facility] [address] [place], [place], [address] Phone: [phone]   Fax:  [phone] 09/11/17, 3:45 PM  Name: Eric Long MRN: [id number] Date of Birth: [birth year]

## 2017-09-11 NOTE — Therapy (Signed)
[facility] [address] [place], [place], [address] Phone: [phone]   Fax:  [phone]  Occupational Therapy Treatment  Patient Details  Name: Eric Long MRN: [id number] Date of Birth: Sep 11, 1940 Referring Provider: Dr. Letta Pate   Encounter Date: 09/11/2017  OT End of Session - 09/11/17 1314    Visit Number  6    Number of Visits  16    Date for OT Re-Evaluation  10/07/17    Authorization Type  medicare    Authorization Time Period  90 days    OT Start Time  0931    OT Stop Time  1015    OT Time Calculation (min)  44 min    Activity Tolerance  Patient tolerated treatment well       Past Medical History:  Diagnosis Date  . Diabetes mellitus without complication ([place])   . Hyperlipidemia     Past Surgical History:  Procedure Laterality Date  . FEMORAL-FEMORAL BYPASS GRAFT Bilateral 2007    There were no vitals filed for this visit.  Subjective Assessment - 09/11/17 0939    Subjective   I am still having trouble eating at home.     Patient is accompained by:  Family member dtr    Pertinent History  R MCA CVA, acute encephalopathy, severe L carotid stenosis.      Patient Stated Goals  back to where I am stable with walking, use the computer, continue reading and writing.     Currently in Pain?  No/denies                   OT Treatments/Exercises (OP) - 09/11/17 0001      ADLs   Eating  Addressed self feeding - pt with significant impairments that impact self feeding including apraxia, figure ground, perceptual deficits, L neglet and L field cut as well as attentional deficits.  Pt eats using both hands (European) - practiced using several strategies and pt's performance improved through session.  Pt with severe delay cognitively and requires signficantly increased time. Educated dtr and wife and recommndations put in writing - see pt instruction section for details.               OT Education - 09/11/17 1312    Education provided  Yes    Education Details  self feeding strategies    Person(s) Educated  Patient;Spouse;Child(ren)    Methods  Explanation;Demonstration;Handout    Comprehension  Verbalized understanding;Returned demonstration       OT Short Term Goals - 09/11/17 1313      OT SHORT TERM GOAL #1   Title  Pt and family will be mod I for home activities program for L field cut/L neglect - 09/09/2017    Status  Achieved      OT SHORT TERM GOAL #2   Title  Assessed 9 hole peg and determine goal if appropriate.    Status  Achieved see PN      OT SHORT TERM GOAL #3   Title  Assess Box and blocks and determine goal if appropriate    Status  Achieved L= 38      OT SHORT TERM GOAL #4   Title  Pt will be supevision for UB dressing     Status  On-going      OT SHORT TERM GOAL #5   Title  Pt will need no more than 3 vc's for clothing orientation when dressing.    Status  On-going      OT SHORT TERM GOAL #6   Title  Pt will be min a, mod cues for LB dressing    Status  On-going      OT SHORT TERM GOAL #7   Title  Pt will locate items during functional tasks on L with no more than min cues     Status  On-going        OT Long Term Goals - 09/11/17 1313      OT LONG TERM GOAL #1   Title  Pt and family will be mod I upgraded HEP prn - 10/07/2017    Status  On-going      OT LONG TERM GOAL #2   Title  Pt will demonstrate envrionmental scanning during functional task with at least 90% accuracy    Status  On-going      OT LONG TERM GOAL #3   Title  Pt will be able to complete bilateral tasks with no more than min cues to attend to L hand    Status  On-going      OT LONG TERM GOAL #4   Title  Pt will be mod I with eating    Status  On-going      OT LONG TERM GOAL #5   Title  Pt will be supervision for simple yard tasks.    Status  Deferred at pts request      OT LONG TERM GOAL #6   Title  Pt will be mod I with dressing       OT LONG TERM GOAL #7   Title  Pt will be mod I with toilet and shower transfers    Status  On-going      OT LONG TERM GOAL #8   Title  Pt will be able to read simple paragraph with compensations and min cueing    Status  On-going            Plan - 09/11/17 1313    Clinical Impression Statement  Pt with slow progress toward goals. Pt with mulit system impairment and signficant delay in processing information    Occupational Profile and client history currently impacting functional performance  PMH:  DM, HTN, HLD, chronic kidney disease, PVD, severe L extracranial carotid stenosis.      Occupational performance deficits (Please refer to evaluation for details):  ADL's;IADL's;Rest and Sleep;Leisure;Social Participation    Rehab Potential  Good    OT Frequency  2x / week    OT Duration  8 weeks    OT Treatment/Interventions  Self-care/ADL training;Aquatic Therapy;Electrical Stimulation;DME and/or AE instruction;Neuromuscular education;Therapeutic exercise;Functional Mobility Training;Manual Therapy;Therapeutic activities;Cognitive remediation/compensation;Balance training;Patient/family education;Visual/perceptual remediation/compensation    Plan  Addressed dressing, NMR for  motor planning with  familiar functional tasks, environmental scanning, reading, NMR for LUE functional use and mobility, LB dressing    Consulted and Agree with Plan of Care  Patient;Family member/caregiver    Family Member Consulted  wife , dtr       Patient will benefit from skilled therapeutic intervention in order to improve the following deficits and impairments:  Abnormal gait, Decreased activity tolerance, Decreased balance, Decreased coordination, Decreased cognition, Decreased knowledge of use of DME, Decreased mobility, Decreased safety awareness, Difficulty walking, Impaired UE functional use, Impaired sensation, Impaired vision/preception  Visit Diagnosis: Hemiplegia and hemiparesis following cerebral  infarction affecting left non-dominant side ([facility])  Other lack of coordination  Unsteadiness on feet  Left-sided neglect  Visuospatial deficit  Other symptoms  and signs involving cognitive functions following cerebral infarction  Other disturbances of skin sensation  Apraxia  Other symptoms and signs involving the nervous system    Problem List Patient Active Problem List   Diagnosis Date Noted  . Acute ischemic right MCA stroke ([place]) 07/26/2017  . Hyperlipidemia 07/23/2017  . Diastolic dysfunction   . Acute blood loss anemia   . Essential hypertension 07/22/2017  . Type 2 diabetes mellitus with renal manifestations ([place]) 07/22/2017  . Acute CVA (cerebrovascular accident) ([place]) 07/21/2017  . Chronic kidney disease, stage III (moderate) ([place]) 02/19/2013  . Atherosclerotic peripheral vascular disease ([place]) 02/19/2013  . Pure hypercholesterolemia 02/19/2013  . Diabetes mellitus, stable ([place]) 02/05/2013    Quay Burow, OTR/L 09/11/2017, 1:16 PM  [facility] [address] [place], [place], [address] Phone: [phone]   Fax:  [phone]  Name: Eric Long MRN: [id number] Date of Birth: August 10, 1940

## 2017-09-11 NOTE — Patient Instructions (Signed)
Tips for eating!    1. Use either shelf liner or placements made out of shelf liner material to keep the plate/bowl from sliding on the table. 2.  Use contrasting colors - for example use light plate if your are eating a piece of dark meat on a dark placement. Use a dark plate if you are eating chicken on light placemat.  This will help you organize things visually. 3. Place a red ruler or piece of folded construction paper all the way to the left of the plate. This will help draw your eye to the left so that you see all your food. 4. Place your drink on your left to force you to look left. 5. Right now it is hard for your left hand to always hold things appropriately.  Your family can help you with this.  They should stop you if the utensil is in your hand wrong and place it back on the table and let you try and pick it up appropriately.  Look at your left hand when you do this. If after a few tries you can't do this, they can help orient the utensil in your hand.  You may need to do this a few times during the meal. 6. Your family should avoid trying to cut things up for you unless absolutely necessary - the only way this gets better is with lots of repetition!  Have patience and keep working at it every meal.  You improved in just 45 minutes in the clinic!! 7. You likely will do fine with a spoon as you use that in your right hand.

## 2017-09-15 ENCOUNTER — Encounter: Payer: Self-pay | Admitting: Rehabilitation

## 2017-09-15 ENCOUNTER — Encounter: Payer: Self-pay | Admitting: Occupational Therapy

## 2017-09-15 ENCOUNTER — Ambulatory Visit: Payer: Medicare HMO | Admitting: Speech Pathology

## 2017-09-15 ENCOUNTER — Ambulatory Visit: Payer: Medicare HMO | Admitting: Rehabilitation

## 2017-09-15 ENCOUNTER — Ambulatory Visit: Payer: Medicare HMO | Admitting: Occupational Therapy

## 2017-09-15 DIAGNOSIS — R41842 Visuospatial deficit: Secondary | ICD-10-CM | POA: Diagnosis not present

## 2017-09-15 DIAGNOSIS — R29818 Other symptoms and signs involving the nervous system: Secondary | ICD-10-CM | POA: Diagnosis not present

## 2017-09-15 DIAGNOSIS — R208 Other disturbances of skin sensation: Secondary | ICD-10-CM | POA: Diagnosis not present

## 2017-09-15 DIAGNOSIS — R2689 Other abnormalities of gait and mobility: Secondary | ICD-10-CM | POA: Diagnosis not present

## 2017-09-15 DIAGNOSIS — I69318 Other symptoms and signs involving cognitive functions following cerebral infarction: Secondary | ICD-10-CM | POA: Diagnosis not present

## 2017-09-15 DIAGNOSIS — R414 Neurologic neglect syndrome: Secondary | ICD-10-CM | POA: Diagnosis not present

## 2017-09-15 DIAGNOSIS — I69354 Hemiplegia and hemiparesis following cerebral infarction affecting left non-dominant side: Secondary | ICD-10-CM

## 2017-09-15 DIAGNOSIS — R41841 Cognitive communication deficit: Secondary | ICD-10-CM

## 2017-09-15 DIAGNOSIS — R482 Apraxia: Secondary | ICD-10-CM

## 2017-09-15 DIAGNOSIS — R278 Other lack of coordination: Secondary | ICD-10-CM

## 2017-09-15 DIAGNOSIS — R2681 Unsteadiness on feet: Secondary | ICD-10-CM | POA: Diagnosis not present

## 2017-09-15 NOTE — Therapy (Signed)
[facility] [address] [place], [place], [address] Phone: [phone]   Fax:  [phone]  Speech Language Pathology Treatment  Patient Details  Name: Eric Long MRN: [id number] Date of Birth: 1940/12/08 Referring Provider: Dr. Alysia Penna   Encounter Date: 09/15/2017  End of Session - 09/15/17 1736    Visit Number  7    Number of Visits  17    Date for SLP Re-Evaluation  10/10/17    Authorization Type  60 total visits - if PT/OT/ST are on 1 day, it counts as 1 visit    SLP Start Time  1533    SLP Stop Time   1615    SLP Time Calculation (min)  42 min    Activity Tolerance  Patient tolerated treatment well       Past Medical History:  Diagnosis Date  . Diabetes mellitus without complication ([place])   . Hyperlipidemia     Past Surgical History:  Procedure Laterality Date  . FEMORAL-FEMORAL BYPASS GRAFT Bilateral 2007    There were no vitals filed for this visit.  Subjective Assessment - 09/15/17 1536    Subjective  Pt nearly tripped over walker as he attempted to walk around it to sit down.     Patient is accompained by:  Family member wife    Currently in Pain?  No/denies            ADULT SLP TREATMENT - 09/15/17 1533      General Information   Behavior/Cognition  Alert;Cooperative    Patient Positioning  Upright in chair      Treatment Provided   Treatment provided  Cognitive-Linquistic      Pain Assessment   Pain Assessment  No/denies pain      Cognitive-Linquistic Treatment   Treatment focused on  Cognition    Skilled Treatment  SLP targeted pt's attention and problem solving today with focus on auditory/verbal tasks. Simple 3 word mental manipulation was 100% accurate in first 6 minutes, with diminishing accuracy in remaining 2 minutes (60%) of task due to decreased attention. Simple time problem solving 100% (how early/late?) accurate during first 10 minutes. As task  progressed, pt required mod-max A, verbal and written cues. Simple finance problem solving 60% accuracy; extended time and slow processing noted.      Assessment / Recommendations / Plan   Plan  Continue with current plan of care      Progression Toward Goals   Progression toward goals  Progressing toward goals         SLP Short Term Goals - 09/15/17 1728      SLP SHORT TERM GOAL #1   Title  Pt will selectively attend to mildly complex cognitive linguistic task with distractions and occasional min redirection    Baseline  09/02/17; 09/04/17, 09/15/17    Time  1    Period  Weeks    Status  On-going      SLP SHORT TERM GOAL #2   Title  Pt will complete mildy complex organization, reasoning, problem sovling tasks with 85% accuracy and occasional min A    Time  1    Period  Weeks    Status  On-going      SLP SHORT TERM GOAL #3   Title  Pt will ID errors and self correct 3/5 errors with occasional min A    Time  1    Period  Weeks    Status  On-going  SLP SHORT TERM GOAL #4   Title  Pt will utilize external aids for medication, schedule management with occasional min A from family outside of therapy over 2 sessions    Time  1    Period  Weeks    Status  On-going       SLP Long Term Goals - 09/15/17 1736      SLP LONG TERM GOAL #1   Title  Pt will alternate attention between 2 mildly complex cognitive linguistic tasksk with 85% on each and occasional min A    Time  5    Period  Weeks    Status  On-going      SLP LONG TERM GOAL #2   Title  Pt will self correct errors 4/5 errors with occasional min A    Time  5    Period  Weeks    Status  On-going      SLP LONG TERM GOAL #3   Title  Pt will recall details from paragraph, attending to the left, with 80% accuracy and occasional min A    Time  5    Period  Weeks    Status  On-going       Plan - 09/15/17 1727    Clinical Impression Statement  Significant cognitive linguistic deficits persist. Progress in ST  affected by severe visual and perceptual impairments.Continue skilled ST with focus on auditory /verbal tasks and use of visual compensations for visual tasks.    Speech Therapy Frequency  2x / week    Treatment/Interventions  Environmental controls;Cueing hierarchy;SLP instruction and feedback;Cognitive reorganization;Functional tasks;Compensatory strategies;Internal/external aids;Patient/family education    Potential to Achieve Goals  Good    Consulted and Agree with Plan of Care  Patient;Family member/caregiver    Family Member Consulted  spouse       Patient will benefit from skilled therapeutic intervention in order to improve the following deficits and impairments:   Cognitive communication deficit    Problem List Patient Active Problem List   Diagnosis Date Noted  . Acute ischemic right MCA stroke ([place]) 07/26/2017  . Hyperlipidemia 07/23/2017  . Diastolic dysfunction   . Acute blood loss anemia   . Essential hypertension 07/22/2017  . Type 2 diabetes mellitus with renal manifestations ([place]) 07/22/2017  . Acute CVA (cerebrovascular accident) ([place]) 07/21/2017  . Chronic kidney disease, stage III (moderate) ([place]) 02/19/2013  . Atherosclerotic peripheral vascular disease ([place]) 02/19/2013  . Pure hypercholesterolemia 02/19/2013  . Diabetes mellitus, stable ([place]) 02/05/2013   Deneise Lever, [place], [place] 09/15/2017, 5:37 PM  [facility] [address] [place], [place], [address] Phone: [phone]   Fax:  [phone]   Name: Eric Long MRN: [id number] Date of Birth: [birth year]

## 2017-09-15 NOTE — Patient Instructions (Addendum)
Bridge    Lie back, legs bent. Tuck in stomach, life hips up towards ceiling. Hold for 2 seconds, then slowly lower back down. Repeat __10__ times. Do 3 sets per session. Do __3__ sessions per week.  http://pm.exer.us/54   Copyright  VHI. All rights reserved.   Sit to Stand: Head Upright    With head upright, stand up slowly with eyes open.  Keep left foot straight (family can sit beside you and help your foot maintain position).  Try to stand without using hands.  Lean forward and sit slowly.   Repeat _10___ times per session. Do _2___ sessions per day.  Copyright  VHI. All rights reserved.   Knee High    Do this forward along the counter top at home and hold counter with one hand.  Have [name] beside you to help.  March slowly forward, turn when you get to the end and march to the other end.  Do 4 laps down and back.   Repeat __1__ times. Do __1-2__ sessions per day.  http://gt2.exer.us/767   Copyright  VHI. All rights reserved.   HIP: Abduction - Standing    Squeeze glutes. Raise leg out and slightly back.  Stay tall and keep feet pointed forward towards counter.  Stay tall.  __10_ reps per set, _1-2__ sets per day, _5-7__ days per week Hold onto a support.  Copyright  VHI. All rights reserved.

## 2017-09-15 NOTE — Therapy (Signed)
[facility] [address] [place], [place], [address] Phone: [phone]   Fax:  [phone]  Physical Therapy Treatment  Patient Details  Name: Eric Long MRN: [id number] Date of Birth: [birth year] Referring Provider: Dr. Letta Pate   Encounter Date: 09/15/2017  PT End of Session - 09/15/17 1516    Visit Number  7    Number of Visits  17    Date for PT Re-Evaluation  10/11/17    Authorization Type  [employer]    PT Start Time  1401    PT Stop Time  1445    PT Time Calculation (min)  44 min    Equipment Utilized During Treatment  -- min guard to S prn    Activity Tolerance  Patient tolerated treatment well    Behavior During Therapy  [facility] for tasks assessed/performed       Past Medical History:  Diagnosis Date  . Diabetes mellitus without complication ([place])   . Hyperlipidemia     Past Surgical History:  Procedure Laterality Date  . FEMORAL-FEMORAL BYPASS GRAFT Bilateral 2007    There were no vitals filed for this visit.  Subjective Assessment - 09/15/17 1406    Subjective  Reports no changes, no falls.     Patient is accompained by:  Family member    Pertinent History  DM, HLD, PVD, CKD (stage III)    Patient Stated Goals  Return to IND living, walk without concern, build up stamina     Currently in Pain?  No/denies                       [facility] Adult PT Treatment/Exercise - 09/15/17 1405      Ambulation/Gait   Ambulation/Gait  Yes    Ambulation/Gait Assistance  4: Min guard    Ambulation/Gait Assistance Details  Continue to have pt ambulate during session without AD to improve safety at home.  Also continue to educate wife that she should be with him during all gait (except for at night) to prevent falls.  pt and wife verbalized understanding.     Ambulation Distance (Feet)  200 Feet    Assistive device  None    Gait Pattern  Step-through pattern;Decreased stride length;Decreased  dorsiflexion - left;Decreased weight shift to left;Decreased trunk rotation    Ambulation Surface  Level;Indoor      Neuro Re-ed    Neuro Re-ed Details   Reviewed sit<>stand from HEP performing x 10 reps during session with cues for foot placement and scooting forward prior to standing.  Added to HEP with counter top marching forward x 4 laps and standing hip abd x 10 reps on each side.  Provided education to wife that she will need to be with him during exercises to cue him appropriately.  Pt and wife verbalized understanding.  Continue to work on having pt shifting and WB through LLE to improve proprioceptive feedback and attention to the L.  Performed stairs x 2 reps with B UE support and cues for foot placement, step ups forwards/backwards in // bars x 10 reps with cues for faster pace and foot position as he tends to want to cross LEs when he does not step wide enough.  Lateral step ups x 10 reps in // bars again same cues for wider step.  Ended session with forward and lateral stepping over orange barriers (varying sizes x 8 barriers) forwards x 2 reps and side stepping x 1  rep with min to mod A with cues for forward stepping and to speed up stepping sequence.  Pt reports "fear" when asked how task feels to him.  Feel that he continues to be limited by perceptual deficits.               PT Education - 09/15/17 1516    Education provided  Yes    Education Details  additions to HEP    Person(s) Educated  Patient;Spouse    Methods  Explanation;Demonstration;Handout    Comprehension  Verbalized understanding;Returned demonstration       PT Short Term Goals - 08/26/17 1109      PT SHORT TERM GOAL #1   Title  Pt will perform HEP with S to improve balance, endurance, strength, and safety. TARGET DATE FOR ALL STGS: 09/09/17    Status  New      PT SHORT TERM GOAL #2   Title  Pt will perform TUG with LRAD, with S,  in </=25 sec. to decr. falls risk    Status  New      PT SHORT TERM GOAL #3    Title  Pt will improve gait speed with LRAD to >/=1.50ft/sec. to decr. falls risk.     Status  New      PT SHORT TERM GOAL #4   Title  Perform BERG and write STG and LTG as indicated.     Status  Achieved      PT SHORT TERM GOAL #5   Title  Pt will amb. 300' over even terrain with LRAD and S to improve safety during functional mobility.     Status  New      Additional Short Term Goals   Additional Short Term Goals  Yes      PT SHORT TERM GOAL #6   Title  Pt will improve BERG score to >/=35/56 to decr. falls risk.     Status  New        PT Long Term Goals - 08/26/17 1110      PT LONG TERM GOAL #1   Title  Pt will verbalize understanding of CVA risk factors and s/s to decr. risk of CVA. (TARGET DATE FOR ALL LTGS: 10/07/17)    Status  New      PT LONG TERM GOAL #2   Title  Pt will be IND with HEP to improve strength, safety, balance, and endurance.     Status  New      PT LONG TERM GOAL #3   Title  Pt will perform TUG with LRAD, at MOD I level, in </=13.5 sec. to decr. falls risk.     Status  New      PT LONG TERM GOAL #4   Title  Pt will improve gait speed to >/=2.1ft/sec. with LRAD to amb. safely in the community.     Status  New      PT LONG TERM GOAL #5   Title  Pt will amb. 600' over uneven terrain with LRAD at MOD I level to improve functional mobility and safety.     Status  New      Additional Long Term Goals   Additional Long Term Goals  Yes      PT LONG TERM GOAL #6   Title  Pt will improve BERG score to >/=39/56 to decr. falls risk.     Status  New            Plan -  09/15/17 1517    Clinical Impression Statement  Skilled session reviewed sit<>stand from HEP and added two counter top exercises at home.  Continue to ambulate during sessions without AD to improve balance at home.  Also continue to work on forced use of LLE and lateral weight shifts to improve proproceptive feedback due to L inattention.     Rehab Potential  Good    Clinical  Impairments Affecting Rehab Potential  see above.     PT Frequency  2x / week    PT Duration  8 weeks    PT Treatment/Interventions  ADLs/Self Care Home Management;Biofeedback;Canalith Repostioning;Electrical Stimulation;Therapeutic exercise;Therapeutic activities;Manual techniques;Wheelchair mobility training;Vestibular;Functional mobility training;Orthotic Fit/Training;Stair training;Gait training;Patient/family education;Cognitive remediation;DME Instruction;Neuromuscular re-education;Balance training    PT Next Visit Plan  check goals, add  flexibility to HEP as able (later), continue gait with RW,esp turns.  Gait without device unless outdoors    Consulted and Agree with Plan of Care  Patient;Family member/caregiver    Family Member Consulted  pt's wife ([name]?), [name], [name]: dtr       Patient will benefit from skilled therapeutic intervention in order to improve the following deficits and impairments:  Abnormal gait, Decreased endurance, Impaired sensation, Postural dysfunction, Impaired flexibility, Impaired tone, Decreased coordination, Decreased safety awareness, Decreased cognition, Decreased range of motion, Dizziness, Decreased balance, Decreased mobility, Decreased activity tolerance, Decreased knowledge of use of DME, Decreased strength, Impaired UE functional use  Visit Diagnosis: Hemiplegia and hemiparesis following cerebral infarction affecting left non-dominant side (HCC)  Unsteadiness on feet  Other abnormalities of gait and mobility     Problem List Patient Active Problem List   Diagnosis Date Noted  . Acute ischemic right MCA stroke ([place]) 07/26/2017  . Hyperlipidemia 07/23/2017  . Diastolic dysfunction   . Acute blood loss anemia   . Essential hypertension 07/22/2017  . Type 2 diabetes mellitus with renal manifestations ([place]) 07/22/2017  . Acute CVA (cerebrovascular accident) ([place]) 07/21/2017  . Chronic kidney disease, stage III (moderate) ([place]) 02/19/2013   . Atherosclerotic peripheral vascular disease ([place]) 02/19/2013  . Pure hypercholesterolemia 02/19/2013  . Diabetes mellitus, stable ([place]) 02/05/2013    Cameron Sprang, PT, MPT [facility] [address] [place], [place], [address] Phone: [phone]   Fax:  [phone] 09/15/17, 3:19 PM  Name: RACIEL CAFFREY MRN: [id number] Date of Birth: [birth year]

## 2017-09-15 NOTE — Therapy (Signed)
[facility] [address] [place], [place], [address] Phone: [phone]   Fax:  [phone]  Occupational Therapy Treatment  Patient Details  Name: Eric Long MRN: [id number] Date of Birth: [birth year] Referring Provider: Dr. Letta Pate   Encounter Date: 09/15/2017  OT End of Session - 09/15/17 1621    Visit Number  7    Number of Visits  16    Date for OT Re-Evaluation  10/07/17    Authorization Type  medicare    Authorization Time Period  90 days    OT Start Time  1447    OT Stop Time  1529    OT Time Calculation (min)  42 min    Activity Tolerance  Patient tolerated treatment well       Past Medical History:  Diagnosis Date  . Diabetes mellitus without complication ([place])   . Hyperlipidemia     Past Surgical History:  Procedure Laterality Date  . FEMORAL-FEMORAL BYPASS GRAFT Bilateral 2007    There were no vitals filed for this visit.  Subjective Assessment - 09/15/17 1452    Subjective   I think this dressing is better    Patient is accompained by:  Family member wife    Pertinent History  R MCA CVA, acute encephalopathy, severe L carotid stenosis.      Patient Stated Goals  back to where I am stable with walking, use the computer, continue reading and writing.     Currently in Pain?  No/denies                   OT Treatments/Exercises (OP) - 09/15/17 0001      ADLs   UB Dressing  Addressed dressing (upper body and lower body) - pt needed questioning cues for clothing orientation and to self monitor for mistakes when dressing.  Pt able to find bottom and top of shirt as well as sleeves holes and neck hole - pt did need cue to find tag and then to orient shirt so that it wouldn't be on backward.  Pt physically able to don shirt.  Pt also needed cueing for orientation for shorts.  Pt placed both LE's into one pant leg and needed questioning cue of "does that look and feel right?" Pt then  able to determine mistake.  Used mirror to assist pt in straightening out shorts for correct orientation once shorts were on. Pt able to phyiscally don shorts without assist.  Pt mod I in donning and doffing socks and shoes (pt just slips sneakers on and does not tie shoes - this was how pt did it before his stroke).  Wife indicated that she is still helping too much at home - discussed how to cue (start with open ended questioning cues, move to more directive cues if pt is unable to figure things out). Wife stated "I need to let him figure things out a little  more."      Functional Mobility  Addressed functional amubulation with and without walker today - pt with improved ability to navigate doors and obstacles in his environment.                OT Short Term Goals - 09/15/17 1620      OT SHORT TERM GOAL #1   Title  Pt and family will be mod I for home activities program for L field cut/L neglect - 09/09/2017    Status  Achieved  OT SHORT TERM GOAL #2   Title  Assessed 9 hole peg and determine goal if appropriate.    Status  Achieved see PN      OT SHORT TERM GOAL #3   Title  Assess Box and blocks and determine goal if appropriate    Status  Achieved L= 38      OT SHORT TERM GOAL #4   Title  Pt will be supevision for UB dressing     Status  Achieved      OT SHORT TERM GOAL #5   Title  Pt will need no more than 3 vc's for clothing orientation when dressing.    Status  Achieved      OT SHORT TERM GOAL #6   Title  Pt will be min a, mod cues for LB dressing    Status  Achieved      OT SHORT TERM GOAL #7   Title  Pt will locate items during functional tasks on L with no more than min cues     Status  On-going        OT Long Term Goals - 09/15/17 1620      OT LONG TERM GOAL #1   Title  Pt and family will be mod I upgraded HEP prn - 10/07/2017    Status  On-going      OT LONG TERM GOAL #2   Title  Pt will demonstrate envrionmental scanning during functional task with  at least 90% accuracy    Status  On-going      OT LONG TERM GOAL #3   Title  Pt will be able to complete bilateral tasks with no more than min cues to attend to L hand    Status  On-going      OT LONG TERM GOAL #4   Title  Pt will be mod I with eating    Status  On-going      OT LONG TERM GOAL #5   Title  Pt will be supervision for simple yard tasks.    Status  Deferred at pts request      OT LONG TERM GOAL #6   Title  Pt will be mod I with dressing      OT LONG TERM GOAL #7   Title  Pt will be mod I with toilet and shower transfers    Status  On-going      OT LONG TERM GOAL #8   Title  Pt will be able to read simple paragraph with compensations and min cueing    Status  On-going            Plan - 09/15/17 1620    Clinical Impression Statement  Pt with slow but steady progress toward goals. Pt with more independence in basic ADL's today    Occupational Profile and client history currently impacting functional performance  PMH:  DM, HTN, HLD, chronic kidney disease, PVD, severe L extracranial carotid stenosis.      Occupational performance deficits (Please refer to evaluation for details):  ADL's;IADL's;Rest and Sleep;Leisure;Social Participation    Rehab Potential  Good    Current Impairments/barriers affecting progress:  signficant perceptual deficits, apraxia, cognitive deficits    OT Frequency  2x / week    OT Duration  8 weeks    OT Treatment/Interventions  Self-care/ADL training;Aquatic Therapy;Electrical Stimulation;DME and/or AE instruction;Neuromuscular education;Therapeutic exercise;Functional Mobility Training;Manual Therapy;Therapeutic activities;Cognitive remediation/compensation;Balance training;Patient/family education;Visual/perceptual remediation/compensation    Plan  NMR for  motor planning  with  familiar functional tasks, environmental scanning, reading, NMR for LUE functional use and mobility, LB dressing    Consulted and Agree with Plan of Care   Patient;Family member/caregiver    Family Member Consulted  wife        Patient will benefit from skilled therapeutic intervention in order to improve the following deficits and impairments:  Abnormal gait, Decreased activity tolerance, Decreased balance, Decreased coordination, Decreased cognition, Decreased knowledge of use of DME, Decreased mobility, Decreased safety awareness, Difficulty walking, Impaired UE functional use, Impaired sensation, Impaired vision/preception  Visit Diagnosis: Hemiplegia and hemiparesis following cerebral infarction affecting left non-dominant side (HCC)  Unsteadiness on feet  Other lack of coordination  Left-sided neglect  Visuospatial deficit  Other symptoms and signs involving cognitive functions following cerebral infarction  Other disturbances of skin sensation  Apraxia  Other symptoms and signs involving the nervous system    Problem List Patient Active Problem List   Diagnosis Date Noted  . Acute ischemic right MCA stroke ([place]) 07/26/2017  . Hyperlipidemia 07/23/2017  . Diastolic dysfunction   . Acute blood loss anemia   . Essential hypertension 07/22/2017  . Type 2 diabetes mellitus with renal manifestations ([place]) 07/22/2017  . Acute CVA (cerebrovascular accident) ([place]) 07/21/2017  . Chronic kidney disease, stage III (moderate) ([place]) 02/19/2013  . Atherosclerotic peripheral vascular disease ([place]) 02/19/2013  . Pure hypercholesterolemia 02/19/2013  . Diabetes mellitus, stable ([place]) 02/05/2013    Eric Long, OTR/L 09/15/2017, 4:23 PM  [facility] [address] [place], [place], [address] Phone: [phone]   Fax:  [phone]  Name: TAKSH HJORT MRN: [id number] Date of Birth: Sep 23, 1940

## 2017-09-16 ENCOUNTER — Encounter: Payer: Self-pay | Admitting: *Deleted

## 2017-09-16 ENCOUNTER — Encounter: Payer: Self-pay | Admitting: Physical Medicine & Rehabilitation

## 2017-09-16 ENCOUNTER — Ambulatory Visit (INDEPENDENT_AMBULATORY_CARE_PROVIDER_SITE_OTHER): Payer: Medicare HMO | Admitting: Vascular Surgery

## 2017-09-16 ENCOUNTER — Encounter: Payer: Self-pay | Admitting: Vascular Surgery

## 2017-09-16 ENCOUNTER — Encounter: Payer: Medicare HMO | Attending: Physical Medicine & Rehabilitation

## 2017-09-16 ENCOUNTER — Ambulatory Visit (HOSPITAL_BASED_OUTPATIENT_CLINIC_OR_DEPARTMENT_OTHER): Payer: Medicare HMO | Admitting: Physical Medicine & Rehabilitation

## 2017-09-16 ENCOUNTER — Other Ambulatory Visit: Payer: Self-pay

## 2017-09-16 VITALS — BP 131/77 | HR 96 | Temp 98.1°F | Resp 16 | Ht 70.5 in | Wt 212.0 lb

## 2017-09-16 VITALS — BP 134/77 | HR 93 | Resp 16 | Ht 70.5 in | Wt 211.0 lb

## 2017-09-16 DIAGNOSIS — Z87891 Personal history of nicotine dependence: Secondary | ICD-10-CM | POA: Insufficient documentation

## 2017-09-16 DIAGNOSIS — Z833 Family history of diabetes mellitus: Secondary | ICD-10-CM | POA: Insufficient documentation

## 2017-09-16 DIAGNOSIS — I69398 Other sequelae of cerebral infarction: Secondary | ICD-10-CM

## 2017-09-16 DIAGNOSIS — I69391 Dysphagia following cerebral infarction: Secondary | ICD-10-CM | POA: Diagnosis not present

## 2017-09-16 DIAGNOSIS — R69 Illness, unspecified: Secondary | ICD-10-CM | POA: Diagnosis not present

## 2017-09-16 DIAGNOSIS — R414 Neurologic neglect syndrome: Secondary | ICD-10-CM

## 2017-09-16 DIAGNOSIS — E119 Type 2 diabetes mellitus without complications: Secondary | ICD-10-CM | POA: Diagnosis not present

## 2017-09-16 DIAGNOSIS — Z95828 Presence of other vascular implants and grafts: Secondary | ICD-10-CM | POA: Diagnosis not present

## 2017-09-16 DIAGNOSIS — R269 Unspecified abnormalities of gait and mobility: Secondary | ICD-10-CM

## 2017-09-16 DIAGNOSIS — E785 Hyperlipidemia, unspecified: Secondary | ICD-10-CM | POA: Insufficient documentation

## 2017-09-16 DIAGNOSIS — I6522 Occlusion and stenosis of left carotid artery: Secondary | ICD-10-CM | POA: Diagnosis not present

## 2017-09-16 DIAGNOSIS — F320 Major depressive disorder, single episode, mild: Secondary | ICD-10-CM | POA: Diagnosis not present

## 2017-09-16 NOTE — Patient Instructions (Addendum)
Referral to Neuropsych  Depression may be cause of poor appetite  Citalopram is

## 2017-09-16 NOTE — Progress Notes (Signed)
Subjective:    Patient ID: Eric Long, male    DOB: 1940-07-23, 77 y.o.   MRN: [id number] 77 year old right handed male with history of T2DM and hyperlipidemia who was admitted on 07/22/2017 with two day history of unsteady gait and left-sided weakness.  CT of head revealed hypodensity in parietal and temporal lobe consistent with acute/subacute infarct.  CTA head showed 40-55% R-ICA stenosis 80% L- ICA stenosis and severe stenosis left vertebral artery.  MRI of brain done showing large area of acute ischemia within posterior right hemisphere involving the right MCA  And small portion of right PCA question emboli.  Dr. Leonie Man felt right MCA infarct was due to intracranial and extracranial last vessel extraocular atherosclerosis and dual antiplatelet was recommended.  He is to follow-up with vascular discharge.  Patient with resultant left visual field deficit for safety awareness balance deficits and cognitive deficits. HPI  Still getting outpt PT, OT, SLP  Poor appetite Depression screen + last month.  Discussed treatment options for this including neuropsychology evaluation versus SSRI versus combination.   Mod I dressing and bathing No falls at home           Pain Inventory Average Pain 0 Pain Right Now 0 My pain is no pain  In the last 24 hours, has pain interfered with the following? General activity 0 Relation with others 0 Enjoyment of life 0 What TIME of day is your pain at its worst? no pain Sleep (in general) Fair  Pain is worse with: no pain Pain improves with: no pain Relief from Meds: no pain  Mobility walk with assistance use a walker how many minutes can you walk? 5 ability to climb steps?  yes do you drive?  no  Function retired  Neuro/Psych loss of taste or smell suicidal thoughts-no active plans  Prior Studies Any changes since last visit?  no  Physicians involved in your care Any changes since last visit?  no   Family History  Problem  Relation Age of Onset  . Diabetes Sister    Social History   Socioeconomic History  . Marital status: Married    Spouse name: Not on file  . Number of children: Not on file  . Years of education: Not on file  . Highest education level: Not on file  Occupational History  . Not on file  Social Needs  . Financial resource strain: Not on file  . Food insecurity:    Worry: Not on file    Inability: Not on file  . Transportation needs:    Medical: Not on file    Non-medical: Not on file  Tobacco Use  . Smoking status: Former Research scientist (life sciences)  . Smokeless tobacco: Never Used  Substance and Sexual Activity  . Alcohol use: No    Frequency: Never  . Drug use: No  . Sexual activity: Not on file  Lifestyle  . Physical activity:    Days per week: Not on file    Minutes per session: Not on file  . Stress: Not on file  Relationships  . Social connections:    Talks on phone: Not on file    Gets together: Not on file    Attends religious service: Not on file    Active member of club or organization: Not on file    Attends meetings of clubs or organizations: Not on file    Relationship status: Not on file  Other Topics Concern  . Not on file  Social History  Narrative  . Not on file   Past Surgical History:  Procedure Laterality Date  . FEMORAL-FEMORAL BYPASS GRAFT Bilateral 2007   Past Medical History:  Diagnosis Date  . Diabetes mellitus without complication ([place])   . Hyperlipidemia    BP 134/77 (BP Location: Left Arm, Patient Position: Sitting, Cuff Size: Normal)   Pulse 93   Resp 16   Ht 5' 10.5" (1.791 m)   Wt 211 lb (95.7 kg)   SpO2 96%   BMI 29.85 kg/m   Opioid Risk Score:   Fall Risk Score:  `1  Depression screen PHQ 2/9  Depression screen [facility] 2/9 08/18/2017 10/21/2016 07/11/2014  Decreased Interest 3 0 0  Down, Depressed, Hopeless 1 0 0  PHQ - 2 Score 4 0 0  Altered sleeping 1 - -  Tired, decreased energy 1 - -  Change in appetite 1 - -  Feeling bad or failure  about yourself  0 - -  Trouble concentrating 3 - -  Moving slowly or fidgety/restless 0 - -  Suicidal thoughts 0 - -  PHQ-9 Score 10 - -  Difficult doing work/chores Somewhat difficult - -    Review of Systems  Constitutional: Negative.   HENT: Negative.   Eyes: Negative.   Respiratory: Negative.   Gastrointestinal: Positive for nausea and vomiting.  Endocrine: Negative.        High blood sugar  Genitourinary: Negative.   Musculoskeletal: Positive for gait problem.  Skin: Negative.   Allergic/Immunologic: Negative.   Psychiatric/Behavioral: Positive for suicidal ideas.       Objective:   Physical Exam  Constitutional: He is oriented to person, place, and time. He appears well-developed and well-nourished.  HENT:  Head: Normocephalic and atraumatic.  Eyes: Pupils are equal, round, and reactive to light. EOM are normal.  Neurological: He is alert and oriented to person, place, and time.  Motor strength is 4/5 in the left deltoid bicep tricep grip 5/5 in the right deltoid bicep tricep grip 5/5 bilateral hip flexor knee extensor and glucose flexor  Sensation mildly reduced in the left hand Positive dysdiadochokinesis with rapid alternating supination pronation left upper extremity. Finger-nose-finger mildly reduced on the left side in terms of dexterity also reduced dexterity with finger to thumb opposition the left hand. Ambulates with rolling walker no evidence of toe drag or knee instability.          Assessment & Plan:  1.  Posterior branch right MCA infarct with left hemiparesis left fine motor deficits gait disorder as well as cognitive disorder. 2.  Poststroke depression versus adjustment disorder referral to neuropsychology for further evaluation. We discussed treating with SSRI specifically citalopram.  Patient would like to hold off until he sees neuropsychology, instructed patient if he changes his mind he can call back and we can send in the order Physical  medicine rehabilitation follow-up in 6 weeks

## 2017-09-16 NOTE — Progress Notes (Signed)
[facility] [place]  Patient name: Eric Long MRN: [id number] DOB: 1940/09/14 Sex: male  REASON FOR CONSULT: Discuss severe asymptomatic left carotid stenosis  HPI: Eric Long is a 77 y.o. male, who is here today for discussion of recent imaging studies revealing high-grade left carotid stenosis.  He is here today with his daughter.  He had presented in mid March with a right brain stroke.  He was admitted to the hospital and eventually to rehabilitation for stroke recovery.  His work-up included a duplex suggesting critical stenosis in his left internal carotid artery with moderate right carotid stenosis.  CT angiogram confirmed this.  He has never had any left brain events.  He is right-handed.  He reports that he does have some cognitive changes and still has some left-sided weakness related to the stroke which is slowly improving.  He does have a distant history of aortobifemoral bypass in another city presumably related occlusive disease  Past Medical History:  Diagnosis Date  . Diabetes mellitus without complication ([place])   . Hyperlipidemia     Family History  Problem Relation Age of Onset  . Diabetes Sister     SOCIAL HISTORY: Social History   Socioeconomic History  . Marital status: Married    Spouse name: Not on file  . Number of children: Not on file  . Years of education: Not on file  . Highest education level: Not on file  Occupational History  . Not on file  Social Needs  . Financial resource strain: Not on file  . Food insecurity:    Worry: Not on file    Inability: Not on file  . Transportation needs:    Medical: Not on file    Non-medical: Not on file  Tobacco Use  . Smoking status: Former Research scientist (life sciences)  . Smokeless tobacco: Never Used  Substance and Sexual Activity  . Alcohol use: No    Frequency: Never  . Drug use: No  . Sexual activity: Not on file  Lifestyle  . Physical activity:   Days per week: Not on file    Minutes per session: Not on file  . Stress: Not on file  Relationships  . Social connections:    Talks on phone: Not on file    Gets together: Not on file    Attends religious service: Not on file    Active member of club or organization: Not on file    Attends meetings of clubs or organizations: Not on file    Relationship status: Not on file  . Intimate partner violence:    Fear of current or ex partner: Not on file    Emotionally abused: Not on file    Physically abused: Not on file    Forced sexual activity: Not on file  Other Topics Concern  . Not on file  Social History Narrative  . Not on file    No Known Allergies  Current Outpatient Medications  Medication Sig Dispense Refill  . acetaminophen (TYLENOL) 325 MG tablet Take 1-2 tablets (325-650 mg total) by mouth every 4 (four) hours as needed for mild pain.    Marland Kitchen aspirin EC 325 MG EC tablet Take 1 tablet (325 mg total) by mouth daily. 30 tablet 0  . benazepril (LOTENSIN) 5 MG tablet TAKE 1 TABLET BY MOUTH ONCE DAILY 30 tablet 0  . clopidogrel (PLAVIX) 75 MG tablet TAKE 1 TABLET BY MOUTH ONCE DAILY 30 tablet 0  . ezetimibe (ZETIA)  10 MG tablet TAKE 1 TABLET BY MOUTH ONCE DAILY 30 tablet 0  . glucose blood (ONETOUCH VERIO) test strip Use to check blood sugar 2 times per day by rotation of meals. Dx code: E11.9 100 each 3  . glucose blood (ONETOUCH VERIO) test strip Use as instructed to check blood sugar 2 times daily. 100 each 12  . Insulin Degludec (TRESIBA FLEXTOUCH [place]) Inject 15 Units into the skin daily.     Marland Kitchen liraglutide (VICTOZA) 18 MG/3ML SOPN INJECT 0.3 MLS  (1.8 MG TOTAL) INTO THE SKIN ONCE A DAY 9 mL 3  . metoprolol succinate (TOPROL-XL) 50 MG 24 hr tablet TAKE 1 TABLET BY MOUTH ONCE DAILY AFTER  BREAKFAST 30 tablet 0  . RELION PEN NEEDLES 32G X 4 MM MISC USE ONE PEN NEEDLE TWICE DAILY 200 each 2  . rosuvastatin (CRESTOR) 40 MG tablet Take 1 tablet (40 mg total) by mouth daily. 30  tablet 0   No current facility-administered medications for this visit.     REVIEW OF SYSTEMS:  [X]  denotes positive finding, [ ]  denotes negative finding Cardiac  Comments:  Chest pain or chest pressure:    Shortness of breath upon exertion:    Short of breath when lying flat:    Irregular heart rhythm:        Vascular    Pain in calf, thigh, or hip brought on by ambulation: x   Pain in feet at night that wakes you up from your sleep:     Blood clot in your veins:    Leg swelling:  x       Pulmonary    Oxygen at home:    Productive cough:     Wheezing:         Neurologic    Sudden weakness in arms or legs:  x   Sudden numbness in arms or legs:     Sudden onset of difficulty speaking or slurred speech:    Temporary loss of vision in one eye:     Problems with dizziness:         Gastrointestinal    Blood in stool:     Vomited blood:         Genitourinary    Burning when urinating:     Blood in urine:        Psychiatric    Major depression:         Hematologic    Bleeding problems:    Problems with blood clotting too easily:        Skin    Rashes or ulcers:        Constitutional    Fever or chills:      PHYSICAL EXAM: Vitals:   09/16/17 1432  BP: 131/77  Pulse: 96  Resp: 16  Temp: 98.1 F (36.7 C)  TempSrc: Oral  SpO2: 97%  Weight: 212 lb (96.2 kg)  Height: 5' 10.5" (1.791 m)    GENERAL: The patient is a well-nourished male, in no acute distress. The vital signs are documented above. CARDIOVASCULAR: Carotid arteries without bruits bilaterally.  2+ radial pulses bilaterally.  Heart regular rate and rhythm PULMONARY: There is good air exchange  ABDOMEN: Soft and non-tender  MUSCULOSKELETAL: There are no major deformities or cyanosis. NEUROLOGIC: Decreased strength in his left leg SKIN: There are no ulcers or rashes noted. PSYCHIATRIC: The patient has a normal affect.  DATA:  Reviewed his duplex and also his CT images.  This shows a extensive  calcified plaque in his left internal carotid artery causing severe stenosis.  Moderate to 50% stenosis on the right carotid  MEDICAL ISSUES: Had a long discussion with the patient and his daughter present.  Splane the appropriate treatment for his right carotid is maximal medical treatment with no severe stenosis present.  I have recommended elective left endarterectomy for reduction of stroke risk.  I would allow for continued rehabilitation recovery from his stroke 2 months ago.  I did discuss symptoms of left carotid disease and they will notify us immediately should this occur.  Otherwise we will plan elective left carotid endarterectomy in mid July   Rosetta Posner, MD [facility] Tel [phone] Pager [phone]

## 2017-09-17 ENCOUNTER — Telehealth: Payer: Self-pay | Admitting: *Deleted

## 2017-09-17 ENCOUNTER — Other Ambulatory Visit: Payer: Self-pay | Admitting: *Deleted

## 2017-09-17 NOTE — Telephone Encounter (Signed)
Confirmed with patient that he is to continue ASA and Plavix per Dr. Donnetta Hutching.Verbalized understanding.

## 2017-09-18 ENCOUNTER — Encounter: Payer: Self-pay | Admitting: Rehabilitation

## 2017-09-18 ENCOUNTER — Encounter: Payer: Self-pay | Admitting: Speech Pathology

## 2017-09-18 ENCOUNTER — Ambulatory Visit: Payer: Medicare HMO | Admitting: Rehabilitation

## 2017-09-18 ENCOUNTER — Ambulatory Visit: Payer: Medicare HMO | Admitting: Occupational Therapy

## 2017-09-18 ENCOUNTER — Encounter: Payer: Self-pay | Admitting: Occupational Therapy

## 2017-09-18 ENCOUNTER — Ambulatory Visit: Payer: Medicare HMO | Admitting: Speech Pathology

## 2017-09-18 DIAGNOSIS — R414 Neurologic neglect syndrome: Secondary | ICD-10-CM | POA: Diagnosis not present

## 2017-09-18 DIAGNOSIS — R278 Other lack of coordination: Secondary | ICD-10-CM | POA: Diagnosis not present

## 2017-09-18 DIAGNOSIS — I69354 Hemiplegia and hemiparesis following cerebral infarction affecting left non-dominant side: Secondary | ICD-10-CM

## 2017-09-18 DIAGNOSIS — R208 Other disturbances of skin sensation: Secondary | ICD-10-CM | POA: Diagnosis not present

## 2017-09-18 DIAGNOSIS — R482 Apraxia: Secondary | ICD-10-CM | POA: Diagnosis not present

## 2017-09-18 DIAGNOSIS — R41841 Cognitive communication deficit: Secondary | ICD-10-CM

## 2017-09-18 DIAGNOSIS — R2681 Unsteadiness on feet: Secondary | ICD-10-CM | POA: Diagnosis not present

## 2017-09-18 DIAGNOSIS — R41842 Visuospatial deficit: Secondary | ICD-10-CM

## 2017-09-18 DIAGNOSIS — R2689 Other abnormalities of gait and mobility: Secondary | ICD-10-CM | POA: Diagnosis not present

## 2017-09-18 DIAGNOSIS — I69318 Other symptoms and signs involving cognitive functions following cerebral infarction: Secondary | ICD-10-CM | POA: Diagnosis not present

## 2017-09-18 DIAGNOSIS — R29818 Other symptoms and signs involving the nervous system: Secondary | ICD-10-CM | POA: Diagnosis not present

## 2017-09-18 NOTE — Therapy (Signed)
[facility] [address] [place], [place], [address] Phone: [phone]   Fax:  [phone]  Occupational Therapy Treatment  Patient Details  Name: Eric Long MRN: [id number] Date of Birth: 12-21-1940 Referring Provider: Dr. Letta Pate   Encounter Date: 09/18/2017  OT End of Session - 09/18/17 1644    Visit Number  8    Number of Visits  16    Date for OT Re-Evaluation  10/07/17    Authorization Type  medicare    Authorization Time Period  90 days    OT Start Time  1316    OT Stop Time  1359    OT Time Calculation (min)  43 min    Activity Tolerance  Patient tolerated treatment well       Past Medical History:  Diagnosis Date  . Diabetes mellitus without complication ([place])   . Hyperlipidemia     Past Surgical History:  Procedure Laterality Date  . FEMORAL-FEMORAL BYPASS GRAFT Bilateral 2007    There were no vitals filed for this visit.  Subjective Assessment - 09/18/17 1323    Subjective   I went into [employer] with my wife - I think it went ok    Patient is accompained by:  Family member wife    Pertinent History  R MCA CVA, acute encephalopathy, severe L carotid stenosis.      Patient Stated Goals  back to where I am stable with walking, use the computer, continue reading and writing.     Currently in Pain?  No/denies                   OT Treatments/Exercises (OP) - 09/18/17 0001      Visual/Perceptual Exercises   Other Exercises  Addressed environmental scanning during functional ambulation task - pt required moderate cues to perceptually orient his body in space. Pt at increased risk for falling due to extremely slow pace of ambulation, impaired perceptual skills and severly delayed cognitve processing.  With moderate cues, pt able to locate all items in environment. Also addressed visual perceptual skills via peg board reconstruction. Pt with severely delayed performance and required  moderate cueing for correction of simple triangle design.                 OT Short Term Goals - 09/18/17 1642      OT SHORT TERM GOAL #1   Title  Pt and family will be mod I for home activities program for L field cut/L neglect - 09/09/2017    Status  Achieved      OT SHORT TERM GOAL #2   Title  Assessed 9 hole peg and determine goal if appropriate.    Status  Achieved see PN      OT SHORT TERM GOAL #3   Title  Assess Box and blocks and determine goal if appropriate    Status  Achieved L= 38      OT SHORT TERM GOAL #4   Title  Pt will be supevision for UB dressing     Status  Achieved      OT SHORT TERM GOAL #5   Title  Pt will need no more than 3 vc's for clothing orientation when dressing.    Status  Achieved      OT SHORT TERM GOAL #6   Title  Pt will be min a, mod cues for LB dressing    Status  Achieved  OT SHORT TERM GOAL #7   Title  Pt will locate items during functional tasks on L with no more than min cues     Status  On-going        OT Long Term Goals - 09/18/17 1642      OT LONG TERM GOAL #1   Title  Pt and family will be mod I upgraded HEP prn - 10/07/2017    Status  On-going      OT LONG TERM GOAL #2   Title  Pt will demonstrate envrionmental scanning during functional task with at least 90% accuracy    Status  On-going      OT LONG TERM GOAL #3   Title  Pt will be able to complete bilateral tasks with no more than min cues to attend to L hand    Status  On-going      OT LONG TERM GOAL #4   Title  Pt will be mod I with eating    Status  Achieved      OT LONG TERM GOAL #5   Title  Pt will be supervision for simple yard tasks.    Status  Deferred at pts request      OT LONG TERM GOAL #6   Title  Pt will be mod I with dressing    Status  On-going      OT LONG TERM GOAL #7   Title  Pt will be mod I with toilet and shower transfers    Status  On-going      OT LONG TERM GOAL #8   Title  Pt will be able to read simple paragraph with  compensations and min cueing    Status  On-going            Plan - 09/18/17 1643    Clinical Impression Statement  Pt with slow progress toward goals. Pt's progress impacted by signficant perceptual issues, apraxia and slowed cognitive processing.     Occupational Profile and client history currently impacting functional performance  PMH:  DM, HTN, HLD, chronic kidney disease, PVD, severe L extracranial carotid stenosis.      Occupational performance deficits (Please refer to evaluation for details):  ADL's;IADL's;Rest and Sleep;Leisure;Social Participation    Current Impairments/barriers affecting progress:  signficant perceptual deficits, apraxia, cognitive deficits    OT Frequency  2x / week    OT Duration  8 weeks    OT Treatment/Interventions  Self-care/ADL training;Aquatic Therapy;Electrical Stimulation;DME and/or AE instruction;Neuromuscular education;Therapeutic exercise;Functional Mobility Training;Manual Therapy;Therapeutic activities;Cognitive remediation/compensation;Balance training;Patient/family education;Visual/perceptual remediation/compensation    Plan  NMR for  motor planning with  familiar functional tasks, environmental scanning, reading, NMR for LUE functional use and mobility, LB dressing    Consulted and Agree with Plan of Care  Patient;Family member/caregiver    Family Member Consulted  wife        Patient will benefit from skilled therapeutic intervention in order to improve the following deficits and impairments:  Abnormal gait, Decreased activity tolerance, Decreased balance, Decreased coordination, Decreased cognition, Decreased knowledge of use of DME, Decreased mobility, Decreased safety awareness, Difficulty walking, Impaired UE functional use, Impaired sensation, Impaired vision/preception  Visit Diagnosis: Unsteadiness on feet  Hemiplegia and hemiparesis following cerebral infarction affecting left non-dominant side (HCC)  Other lack of  coordination  Left-sided neglect  Visuospatial deficit  Other symptoms and signs involving cognitive functions following cerebral infarction  Other disturbances of skin sensation  Apraxia    Problem List Patient Active Problem  List   Diagnosis Date Noted  . Acute ischemic right MCA stroke ([place]) 07/26/2017  . Hyperlipidemia 07/23/2017  . Diastolic dysfunction   . Acute blood loss anemia   . Essential hypertension 07/22/2017  . Type 2 diabetes mellitus with renal manifestations ([place]) 07/22/2017  . Acute CVA (cerebrovascular accident) ([place]) 07/21/2017  . Chronic kidney disease, stage III (moderate) ([place]) 02/19/2013  . Atherosclerotic peripheral vascular disease ([place]) 02/19/2013  . Pure hypercholesterolemia 02/19/2013  . Diabetes mellitus, stable ([place]) 02/05/2013    Quay Burow , OTR/L 09/18/2017, 4:45 PM  [facility] [address] [place], [place], [address] Phone: [phone]   Fax:  [phone]  Name: ARGIE APPLEGATE MRN: [id number] Date of Birth: 26-Sep-1940

## 2017-09-18 NOTE — Therapy (Signed)
[facility] [address] [place], [place], [address] Phone: [phone]   Fax:  [phone]  Speech Language Pathology Treatment  Patient Details  Name: Eric Long MRN: [id number] Date of Birth: 1940/12/26 Referring Provider: Dr. Alysia Penna   Encounter Date: 09/18/2017  End of Session - 09/18/17 1254    Visit Number  8    Number of Visits  17    Authorization Type  60 total visits - if PT/OT/ST are on 1 day, it counts as 1 visit    SLP Start Time  1148    SLP Stop Time   1233    SLP Time Calculation (min)  45 min    Activity Tolerance  Patient tolerated treatment well       Past Medical History:  Diagnosis Date  . Diabetes mellitus without complication ([place])   . Hyperlipidemia     Past Surgical History:  Procedure Laterality Date  . FEMORAL-FEMORAL BYPASS GRAFT Bilateral 2007    There were no vitals filed for this visit.  Subjective Assessment - 09/18/17 1153    Subjective  "I've recovered 2 spots of my memory - I'm improving"    Currently in Pain?  No/denies            ADULT SLP TREATMENT - 09/18/17 1153      General Information   Behavior/Cognition  Alert;Cooperative      Treatment Provided   Treatment provided  Cognitive-Linquistic      Cognitive-Linquistic Treatment   Treatment focused on  Cognition    Skilled Treatment  Facilitated working memory, functional math problem solving with moderately complex word problems completed with mental math with usual cues to keep track of multiple steps in mental math, as well as occasional min A for organizing./reasoning the problem.       Assessment / Recommendations / Plan   Plan  Continue with current plan of care      Progression Toward Goals   Progression toward goals  Progressing toward goals         SLP Short Term Goals - 09/18/17 1253      SLP SHORT TERM GOAL #1   Title  Pt will selectively attend to mildly complex  cognitive linguistic task with distractions and occasional min redirection    Baseline  09/02/17; 09/04/17, 09/15/17    Time  1    Period  Weeks    Status  On-going      SLP SHORT TERM GOAL #2   Title  Pt will complete mildy complex organization, reasoning, problem sovling tasks with 85% accuracy and occasional min A    Time  1    Period  Weeks    Status  On-going      SLP SHORT TERM GOAL #3   Title  Pt will ID errors and self correct 3/5 errors with occasional min A    Time  1    Period  Weeks    Status  On-going      SLP SHORT TERM GOAL #4   Title  Pt will utilize external aids for medication, schedule management with occasional min A from family outside of therapy over 2 sessions    Time  1    Period  Weeks    Status  On-going       SLP Long Term Goals - 09/18/17 1253      SLP LONG TERM GOAL #1   Title  Pt will alternate attention between  2 mildly complex cognitive linguistic tasksk with 85% on each and occasional min A    Time  5    Period  Weeks    Status  On-going      SLP LONG TERM GOAL #2   Title  Pt will self correct errors 4/5 errors with occasional min A    Time  5    Period  Weeks    Status  On-going      SLP LONG TERM GOAL #3   Title  Pt will recall details from paragraph, attending to the left, with 80% accuracy and occasional min A    Time  5    Period  Weeks    Status  On-going       Plan - 09/18/17 1236    Clinical Impression Statement  Pt with improved affect, mood and conversations. Due to visual perceptual impairment, focused on auditory/mental math for working memory, attention and reasoning with min to mod A. Continue skilled ST to maximize safety and reduce caregiver burden    Speech Therapy Frequency  2x / week    Treatment/Interventions  Environmental controls;Cueing hierarchy;SLP instruction and feedback;Cognitive reorganization;Functional tasks;Compensatory strategies;Internal/external aids;Patient/family education    Potential to Achieve  Goals  Good       Patient will benefit from skilled therapeutic intervention in order to improve the following deficits and impairments:   Cognitive communication deficit    Problem List Patient Active Problem List   Diagnosis Date Noted  . Acute ischemic right MCA stroke ([place]) 07/26/2017  . Hyperlipidemia 07/23/2017  . Diastolic dysfunction   . Acute blood loss anemia   . Essential hypertension 07/22/2017  . Type 2 diabetes mellitus with renal manifestations ([place]) 07/22/2017  . Acute CVA (cerebrovascular accident) ([place]) 07/21/2017  . Chronic kidney disease, stage III (moderate) ([place]) 02/19/2013  . Atherosclerotic peripheral vascular disease ([place]) 02/19/2013  . Pure hypercholesterolemia 02/19/2013  . Diabetes mellitus, stable ([place]) 02/05/2013    Eric Long, [place], [place] 09/18/2017, 12:58 PM  [facility] [address] [place], [place], [address] Phone: [phone]   Fax:  [phone]   Name: Eric Long MRN: [id number] Date of Birth: 06-04-1940

## 2017-09-18 NOTE — Therapy (Signed)
[facility] [address] [place], [place], [address] Phone: [phone]   Fax:  [phone]  Physical Therapy Treatment  Patient Details  Name: Eric Long MRN: [id number] Date of Birth: November 27, 1940 Referring Provider: Dr. Letta Pate   Encounter Date: 09/18/2017  PT End of Session - 09/18/17 1207    Visit Number  8    Number of Visits  17    Date for PT Re-Evaluation  10/11/17    Authorization Type  [employer] Medicare    PT Start Time  1102    PT Stop Time  1146    PT Time Calculation (min)  44 min    Equipment Utilized During Treatment  -- min guard to S prn    Activity Tolerance  Patient tolerated treatment well    Behavior During Therapy  [facility] for tasks assessed/performed       Past Medical History:  Diagnosis Date  . Diabetes mellitus without complication ([place])   . Hyperlipidemia     Past Surgical History:  Procedure Laterality Date  . FEMORAL-FEMORAL BYPASS GRAFT Bilateral 2007    There were no vitals filed for this visit.  Subjective Assessment - 09/18/17 1111    Subjective  Pt reports he walked to back of [employer] and back with walker and wife.  Did well.      Patient is accompained by:  Family member    Pertinent History  DM, HLD, PVD, CKD (stage III)    Patient Stated Goals  Return to IND living, walk without concern, build up stamina     Currently in Pain?  No/denies                       [facility] Adult PT Treatment/Exercise - 09/18/17 0001      Ambulation/Gait   Ambulation/Gait  Yes    Ambulation/Gait Assistance  4: Min guard    Ambulation/Gait Assistance Details  continue to ambulate during session without AD at min/guard level.  Pt did better today with less instability on single leg (typically he will ambulate and intermittently will demo sway on one leg for a short time before landing opposite leg).      Ambulation Distance (Feet)  200 Feet    Assistive device  None    Gait  Pattern  Step-through pattern;Decreased stride length;Decreased dorsiflexion - left;Decreased weight shift to left;Decreased trunk rotation    Ambulation Surface  Level;Indoor      Neuro Re-ed    Neuro Re-ed Details   Performed NMR tasks for improved coordination, LLE WB (improved L lateral weight shift w/o LOB) and tasks to carryover to improved gait pattern.  Standing with forward and lateral stepping with PT calling out LE to step and to which color to tap LE to with cues for single controlled step forward and return to beside of opposite extremity.  Pt needs several cues initially to return stepping foot next to stance foot otherwise he tends to over shoot placement of LLE.  Continued tapping task to elevated surface with targets on 4" step tapping RLE only x 10 reps, LLE only x 10 reps again with cues for foot placement.  Progressed this task for improved control and coordination with PT placing styrofoam cup on target with cues for pt to lightly tap and return to ground x 5 reps on each side progressing to double tap to cup x 5 reps on each side.  Pt tends to have difficulty standing  on RLE, shifting too far outside of BOS.  Transitioned to tasks with floor ladder (utilized blue for improved contrast and attention to L) performing forward gait (single foot in each space) x 2 reps, skipping over every other one for larger stride x 4 reps (had marked difficulty with decreased speed to complete task despite cues to speed him up).  Forward stepping/lateral stepping (alternating) x 2 laps, side stepping x 6 laps.  Note pt does markedly better when leading with LLE vs RLE.  Ended session with negotiation of stairs for improved coordination.  Had pt perform in alternating pattern increasing speed as able.  Pt has most difficulty when descending due to difficulty moving LUE with body.               PT Education - 09/18/17 1207    Education provided  Yes    Education Details  rationale for coordination  tasks    Person(s) Educated  Patient    Methods  Explanation    Comprehension  Verbalized understanding       PT Short Term Goals - 08/26/17 1109      PT SHORT TERM GOAL #1   Title  Pt will perform HEP with S to improve balance, endurance, strength, and safety. TARGET DATE FOR ALL STGS: 09/09/17    Status  New      PT SHORT TERM GOAL #2   Title  Pt will perform TUG with LRAD, with S,  in </=25 sec. to decr. falls risk    Status  New      PT SHORT TERM GOAL #3   Title  Pt will improve gait speed with LRAD to >/=1.75ft/sec. to decr. falls risk.     Status  New      PT SHORT TERM GOAL #4   Title  Perform BERG and write STG and LTG as indicated.     Status  Achieved      PT SHORT TERM GOAL #5   Title  Pt will amb. 300' over even terrain with LRAD and S to improve safety during functional mobility.     Status  New      Additional Short Term Goals   Additional Short Term Goals  Yes      PT SHORT TERM GOAL #6   Title  Pt will improve BERG score to >/=35/56 to decr. falls risk.     Status  New        PT Long Term Goals - 08/26/17 1110      PT LONG TERM GOAL #1   Title  Pt will verbalize understanding of CVA risk factors and s/s to decr. risk of CVA. (TARGET DATE FOR ALL LTGS: 10/07/17)    Status  New      PT LONG TERM GOAL #2   Title  Pt will be IND with HEP to improve strength, safety, balance, and endurance.     Status  New      PT LONG TERM GOAL #3   Title  Pt will perform TUG with LRAD, at MOD I level, in </=13.5 sec. to decr. falls risk.     Status  New      PT LONG TERM GOAL #4   Title  Pt will improve gait speed to >/=2.6ft/sec. with LRAD to amb. safely in the community.     Status  New      PT LONG TERM GOAL #5   Title  Pt will amb. 600' over uneven  terrain with LRAD at MOD I level to improve functional mobility and safety.     Status  New      Additional Long Term Goals   Additional Long Term Goals  Yes      PT LONG TERM GOAL #6   Title  Pt will improve  BERG score to >/=39/56 to decr. falls risk.     Status  New            Plan - 09/18/17 1207    Clinical Impression Statement  Skilled session focused on NMR tasks to address coordination, L LE WB and L lateral weight shift along with tasks to carryover to imroved gait quality and balance.  Pt tolerated all tasks with moderate fatigue and did note that speed of processing for each tasks was improved today vs previous sessions.     Rehab Potential  Good    Clinical Impairments Affecting Rehab Potential  see above.     PT Frequency  2x / week    PT Duration  8 weeks    PT Treatment/Interventions  ADLs/Self Care Home Management;Biofeedback;Canalith Repostioning;Electrical Stimulation;Therapeutic exercise;Therapeutic activities;Manual techniques;Wheelchair mobility training;Vestibular;Functional mobility training;Orthotic Fit/Training;Stair training;Gait training;Patient/family education;Cognitive remediation;DME Instruction;Neuromuscular re-education;Balance training    PT Next Visit Plan  check goals, add  flexibility to HEP as able (later), continue gait with RW,esp turns.  Gait without device unless outdoors    Consulted and Agree with Plan of Care  Patient;Family member/caregiver    Family Member Consulted  pt's wife ([name]?), [name], [name]: dtr       Patient will benefit from skilled therapeutic intervention in order to improve the following deficits and impairments:  Abnormal gait, Decreased endurance, Impaired sensation, Postural dysfunction, Impaired flexibility, Impaired tone, Decreased coordination, Decreased safety awareness, Decreased cognition, Decreased range of motion, Dizziness, Decreased balance, Decreased mobility, Decreased activity tolerance, Decreased knowledge of use of DME, Decreased strength, Impaired UE functional use  Visit Diagnosis: Unsteadiness on feet  Other abnormalities of gait and mobility  Hemiplegia and hemiparesis following cerebral infarction  affecting left non-dominant side (HCC)  Other lack of coordination     Problem List Patient Active Problem List   Diagnosis Date Noted  . Acute ischemic right MCA stroke ([place]) 07/26/2017  . Hyperlipidemia 07/23/2017  . Diastolic dysfunction   . Acute blood loss anemia   . Essential hypertension 07/22/2017  . Type 2 diabetes mellitus with renal manifestations ([place]) 07/22/2017  . Acute CVA (cerebrovascular accident) ([place]) 07/21/2017  . Chronic kidney disease, stage III (moderate) ([place]) 02/19/2013  . Atherosclerotic peripheral vascular disease ([place]) 02/19/2013  . Pure hypercholesterolemia 02/19/2013  . Diabetes mellitus, stable ([place]) 02/05/2013    Cameron Sprang, PT, MPT [facility] [address] [place], [place], [address] Phone: [phone]   Fax:  [phone] 09/18/17, 12:10 PM  Name: Eric Long MRN: [id number] Date of Birth: 15-Mar-1941

## 2017-09-22 ENCOUNTER — Ambulatory Visit: Payer: Medicare HMO | Admitting: Occupational Therapy

## 2017-09-22 ENCOUNTER — Encounter: Payer: Self-pay | Admitting: Occupational Therapy

## 2017-09-22 ENCOUNTER — Ambulatory Visit: Payer: Medicare HMO | Admitting: Speech Pathology

## 2017-09-22 ENCOUNTER — Encounter: Payer: Self-pay | Admitting: Rehabilitation

## 2017-09-22 ENCOUNTER — Ambulatory Visit: Payer: Medicare HMO | Admitting: Rehabilitation

## 2017-09-22 DIAGNOSIS — I69318 Other symptoms and signs involving cognitive functions following cerebral infarction: Secondary | ICD-10-CM | POA: Diagnosis not present

## 2017-09-22 DIAGNOSIS — R278 Other lack of coordination: Secondary | ICD-10-CM

## 2017-09-22 DIAGNOSIS — R41842 Visuospatial deficit: Secondary | ICD-10-CM

## 2017-09-22 DIAGNOSIS — R2681 Unsteadiness on feet: Secondary | ICD-10-CM | POA: Diagnosis not present

## 2017-09-22 DIAGNOSIS — R2689 Other abnormalities of gait and mobility: Secondary | ICD-10-CM

## 2017-09-22 DIAGNOSIS — R208 Other disturbances of skin sensation: Secondary | ICD-10-CM

## 2017-09-22 DIAGNOSIS — R414 Neurologic neglect syndrome: Secondary | ICD-10-CM

## 2017-09-22 DIAGNOSIS — I69354 Hemiplegia and hemiparesis following cerebral infarction affecting left non-dominant side: Secondary | ICD-10-CM | POA: Diagnosis not present

## 2017-09-22 DIAGNOSIS — R29818 Other symptoms and signs involving the nervous system: Secondary | ICD-10-CM | POA: Diagnosis not present

## 2017-09-22 DIAGNOSIS — R482 Apraxia: Secondary | ICD-10-CM | POA: Diagnosis not present

## 2017-09-22 NOTE — Therapy (Signed)
[place] [address] [place], [place], [address] Phone: [phone]   Fax:  [phone]  Occupational Therapy Treatment  Patient Details  Name: Eric Long MRN: [id number] Date of Birth: 1940-12-23 Referring Provider: Dr. Letta Pate   Encounter Date: 09/22/2017  OT End of Session - 09/22/17 1650    Visit Number  9    Number of Visits  16    Date for OT Re-Evaluation  10/07/17    Authorization Type  medicare    Authorization Time Period  90 days    OT Start Time  1402    OT Stop Time  1445    OT Time Calculation (min)  43 min    Activity Tolerance  Patient tolerated treatment well       Past Medical History:  Diagnosis Date  . Diabetes mellitus without complication ([place])   . Hyperlipidemia     Past Surgical History:  Procedure Laterality Date  . FEMORAL-FEMORAL BYPASS GRAFT Bilateral 2007    There were no vitals filed for this visit.  Subjective Assessment - 09/22/17 1405    Subjective   It's like I have to learn every thing all over again    Patient is accompained by:  Family member    Pertinent History  R MCA CVA, acute encephalopathy, severe L carotid stenosis.      Patient Stated Goals  back to where I am stable with walking, use the computer, continue reading and writing.     Currently in Pain?  No/denies                   OT Treatments/Exercises (OP) - 09/22/17 0001      Visual/Perceptual Exercises   Other Exercises  Table top scanning activity at 54M level - pt with much improved accuracy with 95% correct.  Pt able to locate far L side without any cues at  all.  Also incorporated environmental scanning into activities for neuro re education - pt with inconsistent performance. Pt's performance varied from 90% to 50%.        Neurological Re-education Exercises   Other Exercises 1  Neuro re education via functional ambulation activities without a device that addressed dynamic  standing balance, functional ambulation, navigating small spaces, and environmental scanning. Pt with LOB x3.                 OT Short Term Goals - 09/22/17 1648      OT SHORT TERM GOAL #1   Title  Pt and family will be mod I for home activities program for L field cut/L neglect - 09/09/2017    Status  Achieved      OT SHORT TERM GOAL #2   Title  Assessed 9 hole peg and determine goal if appropriate.    Status  Achieved see PN      OT SHORT TERM GOAL #3   Title  Assess Box and blocks and determine goal if appropriate    Status  Achieved L= 38      OT SHORT TERM GOAL #4   Title  Pt will be supevision for UB dressing     Status  Achieved      OT SHORT TERM GOAL #5   Title  Pt will need no more than 3 vc's for clothing orientation when dressing.    Status  Achieved      OT SHORT TERM GOAL #6   Title  Pt will be min  a, mod cues for LB dressing    Status  Achieved      OT SHORT TERM GOAL #7   Title  Pt will locate items during functional tasks on L with no more than min cues     Status  On-going        OT Long Term Goals - 09/22/17 1649      OT LONG TERM GOAL #1   Title  Pt and family will be mod I upgraded HEP prn - 10/07/2017    Status  On-going      OT LONG TERM GOAL #2   Title  Pt will demonstrate envrionmental scanning during functional task with at least 90% accuracy    Status  On-going      OT LONG TERM GOAL #3   Title  Pt will be able to complete bilateral tasks with no more than min cues to attend to L hand    Status  On-going      OT LONG TERM GOAL #4   Title  Pt will be mod I with eating    Status  Achieved      OT LONG TERM GOAL #5   Title  Pt will be supervision for simple yard tasks.    Status  Deferred at pts request      OT LONG TERM GOAL #6   Title  Pt will be mod I with dressing    Status  On-going      OT LONG TERM GOAL #7   Title  Pt will be mod I with toilet and shower transfers    Status  On-going      OT LONG TERM GOAL #8    Title  Pt will be able to read simple paragraph with compensations and min cueing    Status  On-going            Plan - 09/22/17 1649    Clinical Impression Statement  Pt with slow progress toward goals. Pt with inconsistent performance with environmental scanning tasks.     Occupational Profile and client history currently impacting functional performance  PMH:  DM, HTN, HLD, chronic kidney disease, PVD, severe L extracranial carotid stenosis.      Occupational performance deficits (Please refer to evaluation for details):  ADL's;IADL's;Rest and Sleep;Leisure;Social Participation    Rehab Potential  Good    Current Impairments/barriers affecting progress:  signficant perceptual deficits, apraxia, cognitive deficits    OT Frequency  2x / week    OT Duration  8 weeks    OT Treatment/Interventions  Self-care/ADL training;Aquatic Therapy;Electrical Stimulation;DME and/or AE instruction;Neuromuscular education;Therapeutic exercise;Functional Mobility Training;Manual Therapy;Therapeutic activities;Cognitive remediation/compensation;Balance training;Patient/family education;Visual/perceptual remediation/compensation    Plan  NMR for  motor planning with  familiar functional tasks, environmental scanning, reading, NMR for LUE functional use and mobility, LB dressing    Consulted and Agree with Plan of Care  Patient;Family member/caregiver    Family Member Consulted  wife in waiting room       Patient will benefit from skilled therapeutic intervention in order to improve the following deficits and impairments:  Abnormal gait, Decreased activity tolerance, Decreased balance, Decreased coordination, Decreased cognition, Decreased knowledge of use of DME, Decreased mobility, Decreased safety awareness, Difficulty walking, Impaired UE functional use, Impaired sensation, Impaired vision/preception  Visit Diagnosis: Unsteadiness on feet  Hemiplegia and hemiparesis following cerebral infarction  affecting left non-dominant side (HCC)  Other lack of coordination  Left-sided neglect  Visuospatial deficit  Other symptoms and signs  involving cognitive functions following cerebral infarction  Other disturbances of skin sensation  Apraxia    Problem List Patient Active Problem List   Diagnosis Date Noted  . Acute ischemic right MCA stroke ([place]) 07/26/2017  . Hyperlipidemia 07/23/2017  . Diastolic dysfunction   . Acute blood loss anemia   . Essential hypertension 07/22/2017  . Type 2 diabetes mellitus with renal manifestations ([place]) 07/22/2017  . Acute CVA (cerebrovascular accident) ([place]) 07/21/2017  . Chronic kidney disease, stage III (moderate) ([name]) 02/19/2013  . Atherosclerotic peripheral vascular disease ([place]) 02/19/2013  . Pure hypercholesterolemia 02/19/2013  . Diabetes mellitus, stable ([place]) 02/05/2013    Quay Burow, OTR/L 09/22/2017, 4:52 PM  [facility] [address] [place], [place], [address] Phone: [phone]   Fax:  [phone]  Name: Eric Long MRN: [id number] Date of Birth: 1941-02-25

## 2017-09-22 NOTE — Therapy (Signed)
[facility] [address] [place], [place], [address] Phone: [phone]   Fax:  [phone]  Physical Therapy Treatment  Patient Details  Name: Eric Long MRN: [id number] Date of Birth: Sep 21, 1940 Referring Provider: Dr. Letta Pate   Encounter Date: 09/22/2017  PT End of Session - 09/22/17 2107    Visit Number  9    Number of Visits  17    Date for PT Re-Evaluation  10/11/17    Authorization Type  A[employer]na Medicare    PT Start Time  1448    PT Stop Time  1533    PT Time Calculation (min)  45 min    Equipment Utilized During Treatment  -- min guard to S prn    Activity Tolerance  Patient tolerated treatment well    Behavior During Therapy  [facility] for tasks assessed/performed       Past Medical History:  Diagnosis Date  . Diabetes mellitus without complication ([place])   . Hyperlipidemia     Past Surgical History:  Procedure Laterality Date  . FEMORAL-FEMORAL BYPASS GRAFT Bilateral 2007    There were no vitals filed for this visit.  Subjective Assessment - 09/22/17 1453    Subjective  Pt reports no changes since last visit, no pain.     Pertinent History  DM, HLD, PVD, CKD (stage III)    Patient Stated Goals  Return to IND living, walk without concern, build up stamina     Currently in Pain?  No/denies                       [facility] Adult PT Treatment/Exercise - 09/22/17 1520      Ambulation/Gait   Ambulation/Gait  Yes    Ambulation/Gait Assistance  4: Min guard    Ambulation/Gait Assistance Details  Assessed STG for ambulation up to 200' with use of RW (as he states he is using this more at home so that wife doesn't need to assist him).  Note that in clinic he requires min/guard A for safety with max verbal cues to problem solve and sequence negotiation through tight spaces.  He reports he continues to bump into objects on L at home with RW, but feel that due to decreased speed of movement with  RW, he is less likely to fall.      Ambulation Distance (Feet)  300 Feet    Assistive device  Rolling walker;None    Gait Pattern  Step-through pattern;Decreased stride length;Decreased dorsiflexion - left;Decreased weight shift to left;Decreased trunk rotation    Ambulation Surface  Level;Indoor    Gait velocity  1.66 ft/sec without AD, 2.04 ft/sec with RW      Standardized Balance Assessment   Standardized Balance Assessment  Berg Balance Test;Timed Up and Go Test      Berg Balance Test   Sit to Stand  Able to stand without using hands and stabilize independently    Standing Unsupported  Able to stand safely 2 minutes    Sitting with Back Unsupported but Feet Supported on Floor or Stool  Able to sit safely and securely 2 minutes    Stand to Sit  Controls descent by using hands    Transfers  Able to transfer safely, definite need of hands    Standing Unsupported with Eyes Closed  Able to stand 10 seconds with supervision    Standing Ubsupported with Feet Together  Able to place feet together independently and stand for 1  minute with supervision    From Standing, Reach Forward with Outstretched Arm  Reaches forward but needs supervision    From Standing Position, Pick up Object from Bootjack to pick up shoe, needs supervision    From Standing Position, Turn to Look Behind Over each Shoulder  Needs assist to keep from losing balance and falling      Timed Up and Go Test   TUG  Normal TUG    Normal TUG (seconds)  36.37 with RW, 19.65 secs without AD             PT Education - 09/22/17 2100    Education provided  Yes    Education Details  progress based on STGs    Person(s) Educated  Patient    Methods  Explanation    Comprehension  Verbalized understanding       PT Short Term Goals - 09/22/17 1454      PT SHORT TERM GOAL #1   Title  Pt will perform HEP with S to improve balance, endurance, strength, and safety. TARGET DATE FOR ALL STGS: 09/09/17    Status  New      PT  SHORT TERM GOAL #2   Title  Pt will perform TUG with LRAD, with S,  in </=25 sec. to decr. falls risk    Baseline  36.37 secs with RW, 19.65 secs without device min/guard A for safety.     Status  Achieved      PT SHORT TERM GOAL #3   Title  Pt will improve gait speed with LRAD to >/=1.39f/sec. to decr. falls risk.     Baseline  2.04 ft/sec with RW, 1.66 ft/sec without device    Status  Achieved      PT SHORT TERM GOAL #4   Title  Perform BERG and write STG and LTG as indicated.     Status  Achieved      PT SHORT TERM GOAL #5   Title  Pt will amb. 300' over even terrain with LRAD and S to improve safety during functional mobility.     Baseline  not met in clinic on 09/22/17, min/guard with max cues for walker negotiation.     Status  Not Met      PT SHORT TERM GOAL #6   Title  Pt will improve BERG score to >/=35/56 to decr. falls risk.     Status  New        PT Long Term Goals - 08/26/17 1110      PT LONG TERM GOAL #1   Title  Pt will verbalize understanding of CVA risk factors and s/s to decr. risk of CVA. (TARGET DATE FOR ALL LTGS: 10/07/17)    Status  New      PT LONG TERM GOAL #2   Title  Pt will be IND with HEP to improve strength, safety, balance, and endurance.     Status  New      PT LONG TERM GOAL #3   Title  Pt will perform TUG with LRAD, at MOD I level, in </=13.5 sec. to decr. falls risk.     Status  New      PT LONG TERM GOAL #4   Title  Pt will improve gait speed to >/=2.679fsec. with LRAD to amb. safely in the community.     Status  New      PT LONG TERM GOAL #5   Title  Pt will amb. 600'  over uneven terrain with LRAD at MOD I level to improve functional mobility and safety.     Status  New      Additional Long Term Goals   Additional Long Term Goals  Yes      PT LONG TERM GOAL #6   Title  Pt will improve BERG score to >/=39/56 to decr. falls risk.     Status  New            Plan - 09/22/17 2104    Clinical Impression Statement  Pt has met  3/6 STGs during today's session.  Did not have time to finish BERG assessment, will plan to finish this along with remaining STGs at next session.  Note that pt tends to have improved gait speed in straight path with RW, however when having to sequence turning or negotiation around obstacles, requires increased time and max cues to complete safely.      Rehab Potential  Good    Clinical Impairments Affecting Rehab Potential  see above.     PT Frequency  2x / week    PT Duration  8 weeks    PT Treatment/Interventions  ADLs/Self Care Home Management;Biofeedback;Canalith Repostioning;Electrical Stimulation;Therapeutic exercise;Therapeutic activities;Manual techniques;Wheelchair mobility training;Vestibular;Functional mobility training;Orthotic Fit/Training;Stair training;Gait training;Patient/family education;Cognitive remediation;DME Instruction;Neuromuscular re-education;Balance training    PT Next Visit Plan  Finish BERG and STGS, add  flexibility to HEP as able (later), continue gait with RW,esp turns.  Gait without device unless outdoors    Consulted and Agree with Plan of Care  Patient;Family member/caregiver    Family Member Consulted  pt's wife ([name]?), [name], [name]: dtr       Patient will benefit from skilled therapeutic intervention in order to improve the following deficits and impairments:  Abnormal gait, Decreased endurance, Impaired sensation, Postural dysfunction, Impaired flexibility, Impaired tone, Decreased coordination, Decreased safety awareness, Decreased cognition, Decreased range of motion, Dizziness, Decreased balance, Decreased mobility, Decreased activity tolerance, Decreased knowledge of use of DME, Decreased strength, Impaired UE functional use  Visit Diagnosis: Unsteadiness on feet  Hemiplegia and hemiparesis following cerebral infarction affecting left non-dominant side (HCC)  Other abnormalities of gait and mobility     Problem List Patient Active Problem  List   Diagnosis Date Noted  . Acute ischemic right MCA stroke ([place]) 07/26/2017  . Hyperlipidemia 07/23/2017  . Diastolic dysfunction   . Acute blood loss anemia   . Essential hypertension 07/22/2017  . Type 2 diabetes mellitus with renal manifestations ([place]) 07/22/2017  . Acute CVA (cerebrovascular accident) ([place]) 07/21/2017  . Chronic kidney disease, stage III (moderate) ([place]) 02/19/2013  . Atherosclerotic peripheral vascular disease ([place]) 02/19/2013  . Pure hypercholesterolemia 02/19/2013  . Diabetes mellitus, stable ([place]) 02/05/2013    Cameron Sprang, PT, MPT [facility] [address] [place], [place], [address] Phone: [phone]   Fax:  [phone] 09/22/17, 9:09 PM  Name: Eric Long MRN: [id number] Date of Birth: Apr 28, 1941

## 2017-09-23 ENCOUNTER — Encounter: Payer: Self-pay | Admitting: Occupational Therapy

## 2017-09-23 ENCOUNTER — Ambulatory Visit: Payer: Medicare HMO | Admitting: Occupational Therapy

## 2017-09-23 ENCOUNTER — Ambulatory Visit: Payer: Medicare HMO

## 2017-09-23 ENCOUNTER — Ambulatory Visit: Payer: Medicare HMO | Admitting: Speech Pathology

## 2017-09-23 DIAGNOSIS — I69318 Other symptoms and signs involving cognitive functions following cerebral infarction: Secondary | ICD-10-CM | POA: Diagnosis not present

## 2017-09-23 DIAGNOSIS — R482 Apraxia: Secondary | ICD-10-CM

## 2017-09-23 DIAGNOSIS — R41842 Visuospatial deficit: Secondary | ICD-10-CM

## 2017-09-23 DIAGNOSIS — R2681 Unsteadiness on feet: Secondary | ICD-10-CM

## 2017-09-23 DIAGNOSIS — I69354 Hemiplegia and hemiparesis following cerebral infarction affecting left non-dominant side: Secondary | ICD-10-CM

## 2017-09-23 DIAGNOSIS — R208 Other disturbances of skin sensation: Secondary | ICD-10-CM

## 2017-09-23 DIAGNOSIS — R414 Neurologic neglect syndrome: Secondary | ICD-10-CM

## 2017-09-23 DIAGNOSIS — R278 Other lack of coordination: Secondary | ICD-10-CM

## 2017-09-23 DIAGNOSIS — R29818 Other symptoms and signs involving the nervous system: Secondary | ICD-10-CM | POA: Diagnosis not present

## 2017-09-23 DIAGNOSIS — R2689 Other abnormalities of gait and mobility: Secondary | ICD-10-CM | POA: Diagnosis not present

## 2017-09-23 DIAGNOSIS — R41841 Cognitive communication deficit: Secondary | ICD-10-CM

## 2017-09-23 NOTE — Therapy (Signed)
[facility] [address] [place], [place], [address] Phone: [phone]   Fax:  [phone]  Occupational Therapy Treatment  Patient Details  Name: Eric Long MRN: [id number] Date of Birth: 1940/06/11 Referring Provider: Dr. Letta Pate   Encounter Date: 09/23/2017  OT End of Session - 09/23/17 1611    Visit Number  10    Number of Visits  16    Date for OT Re-Evaluation  10/07/17    Authorization Type  medicare    Authorization Time Period  90 days    OT Start Time  1402    OT Stop Time  1445    OT Time Calculation (min)  43 min    Activity Tolerance  Patient tolerated treatment well       Past Medical History:  Diagnosis Date  . Diabetes mellitus without complication ([place])   . Hyperlipidemia     Past Surgical History:  Procedure Laterality Date  . FEMORAL-FEMORAL BYPASS GRAFT Bilateral 2007    There were no vitals filed for this visit.  Subjective Assessment - 09/23/17 1405    Subjective   Denies any pain    Patient is accompained by:  Family member wife in waiting room    Pertinent History  R MCA CVA, acute encephalopathy, severe L carotid stenosis.      Patient Stated Goals  back to where I am stable with walking, use the computer, continue reading and writing.     Currently in Pain?  No/denies                   OT Treatments/Exercises (OP) - 09/23/17 0001      Visual/Perceptual Exercises   Other Exercises  Addressed visual perceputal skills by utilizing matching shape activity - pt required significantly increased time and demonstrates very slowed processing and poor simple problem solving skills.  Also addressed using functional activity of folding letters into trifold and placing letter in envelope. Pt requried max cues, max demonstration and singficant repetition.  Pt extremely slow with processing and difficulty generalizing strategies.        Neurological Re-education Exercises    Other Exercises 1  Utilized above activities to address motor planning, unilateral and bilateral UE use within the context of functional task.  Pt required cues and demonstration intermittently to orient L hand appropriately to task.               OT Short Term Goals - 09/23/17 1609      OT SHORT TERM GOAL #1   Title  Pt and family will be mod I for home activities program for L field cut/L neglect - 09/09/2017    Status  Achieved      OT SHORT TERM GOAL #2   Title  Assessed 9 hole peg and determine goal if appropriate.    Status  Achieved see PN      OT SHORT TERM GOAL #3   Title  Assess Box and blocks and determine goal if appropriate    Status  Achieved L= 38      OT SHORT TERM GOAL #4   Title  Pt will be supevision for UB dressing     Status  Achieved      OT SHORT TERM GOAL #5   Title  Pt will need no more than 3 vc's for clothing orientation when dressing.    Status  Achieved      OT SHORT TERM GOAL #6  Title  Pt will be min a, mod cues for LB dressing    Status  Achieved      OT SHORT TERM GOAL #7   Title  Pt will locate items during functional tasks on L with no more than min cues     Status  On-going        OT Long Term Goals - 09/23/17 1609      OT LONG TERM GOAL #1   Title  Pt and family will be mod I upgraded HEP prn - 10/07/2017    Status  On-going      OT LONG TERM GOAL #2   Title  Pt will demonstrate envrionmental scanning during functional task with at least 90% accuracy    Status  On-going      OT LONG TERM GOAL #3   Title  Pt will be able to complete bilateral tasks with no more than min cues to attend to L hand    Status  Achieved      OT LONG TERM GOAL #4   Title  Pt will be mod I with eating    Status  Achieved      OT LONG TERM GOAL #5   Title  Pt will be supervision for simple yard tasks.    Status  Deferred at pts request      OT LONG TERM GOAL #6   Title  Pt will be mod I with dressing    Status  On-going      OT LONG  TERM GOAL #7   Title  Pt will be mod I with toilet and shower transfers    Status  On-going      OT LONG TERM GOAL #8   Title  Pt will be able to read simple paragraph with compensations and min cueing    Status  On-going            Plan - 09/23/17 1609    Clinical Impression Statement  Pt with slow progress toward goals. Pt with signficant delays in basic processing, problem solving and executing task.     Occupational Profile and client history currently impacting functional performance  PMH:  DM, HTN, HLD, chronic kidney disease, PVD, severe L extracranial carotid stenosis.      Occupational performance deficits (Please refer to evaluation for details):  ADL's;IADL's;Rest and Sleep;Leisure;Social Participation    Rehab Potential  Good    Current Impairments/barriers affecting progress:  signficant perceptual deficits, apraxia, cognitive deficits    OT Frequency  2x / week    OT Duration  8 weeks    OT Treatment/Interventions  Self-care/ADL training;Aquatic Therapy;Electrical Stimulation;DME and/or AE instruction;Neuromuscular education;Therapeutic exercise;Functional Mobility Training;Manual Therapy;Therapeutic activities;Cognitive remediation/compensation;Balance training;Patient/family education;Visual/perceptual remediation/compensation    Plan  NMR for  motor planning with  familiar functional tasks, environmental scanning, reading, NMR for LUE functional use and mobility, LB dressing    Consulted and Agree with Plan of Care  Patient;Family member/caregiver    Family Member Consulted  wife in waiting room       Patient will benefit from skilled therapeutic intervention in order to improve the following deficits and impairments:  Abnormal gait, Decreased activity tolerance, Decreased balance, Decreased coordination, Decreased cognition, Decreased knowledge of use of DME, Decreased mobility, Decreased safety awareness, Difficulty walking, Impaired UE functional use, Impaired  sensation, Impaired vision/preception  Visit Diagnosis: Hemiplegia and hemiparesis following cerebral infarction affecting left non-dominant side (HCC)  Unsteadiness on feet  Other lack of coordination  Left-sided neglect  Visuospatial deficit  Other symptoms and signs involving cognitive functions following cerebral infarction  Other disturbances of skin sensation  Apraxia    Problem List Patient Active Problem List   Diagnosis Date Noted  . Acute ischemic right MCA stroke ([place]) 07/26/2017  . Hyperlipidemia 07/23/2017  . Diastolic dysfunction   . Acute blood loss anemia   . Essential hypertension 07/22/2017  . Type 2 diabetes mellitus with renal manifestations ([place]) 07/22/2017  . Acute CVA (cerebrovascular accident) ([place]) 07/21/2017  . Chronic kidney disease, stage III (moderate) ([place]) 02/19/2013  . Atherosclerotic peripheral vascular disease ([place]) 02/19/2013  . Pure hypercholesterolemia 02/19/2013  . Diabetes mellitus, stable ([place]) 02/05/2013    Quay Burow, OTR/L 09/23/2017, [facility] [address] [place], [place], [address] Phone: [phone]   Fax:  [phone]  Name: SANJAY BROADFOOT MRN: [id number] Date of Birth: 1941-03-05

## 2017-09-23 NOTE — Therapy (Signed)
[facility] [address] [place], [place], [address] Phone: [phone]   Fax:  [phone]  Speech Language Pathology Treatment  Patient Details  Name: Eric Long MRN: [id number] Date of Birth: Apr 01, 1941 Referring Provider: Dr. Alysia Penna   Encounter Date: 09/23/2017  End of Session - 09/23/17 1459    Visit Number  9    Date for SLP Re-Evaluation  10/10/17    Authorization Type  60 total visits - if PT/OT/ST are on 1 day, it counts as 1 visit    SLP Start Time  1317    SLP Stop Time   1400    SLP Time Calculation (min)  43 min    Activity Tolerance  Patient tolerated treatment well       Past Medical History:  Diagnosis Date  . Diabetes mellitus without complication ([place])   . Hyperlipidemia     Past Surgical History:  Procedure Laterality Date  . FEMORAL-FEMORAL BYPASS GRAFT Bilateral 2007    There were no vitals filed for this visit.  Subjective Assessment - 09/23/17 1327    Subjective  "I left the notebook and cane at home"    Patient is accompained by:  Family member    Currently in Pain?  No/denies            ADULT SLP TREATMENT - 09/23/17 1327      General Information   Behavior/Cognition  Alert;Cooperative      Treatment Provided   Treatment provided  Cognitive-Linquistic      Cognitive-Linquistic Treatment   Treatment focused on  Cognition    Skilled Treatment  Targeted working memory, organizing, processing in simple time word problems. In 1 step, simple word problems pt 90% accurate. When simple time problems had 2 steps or required backward counting, pt required usual mod A, with inconsistent comprehension of the problem and his errors. Simple auditory money counting with 80% accuracy, extended time and consistent verbal cues for error awareness. Pt requested repetition 1x without prompting, despite not recalling/processing amount. Pt required large visual cues for simple  alphabetizing task, and consistent A for error awarness      Assessment / Recommendations / Plan   Plan  Continue with current plan of care      Progression Toward Goals   Progression toward goals  Not progressing toward goals (comment) poor error awareness, poor intellectual awareness         SLP Short Term Goals - 09/23/17 1458      SLP SHORT TERM GOAL #1   Title  Pt will selectively attend to mildly complex cognitive linguistic task with distractions and occasional min redirection    Baseline  09/02/17; 09/04/17, 09/15/17    Time  1    Period  Weeks    Status  Achieved      SLP SHORT TERM GOAL #2   Title  Pt will complete mildy complex organization, reasoning, problem sovling tasks with 85% accuracy and occasional min A    Time  1    Period  Weeks    Status  Not Met      SLP SHORT TERM GOAL #3   Title  Pt will ID errors and self correct 3/5 errors with occasional min A    Time  1    Period  Weeks    Status  Not Met      SLP SHORT TERM GOAL #4   Title  Pt will utilize external aids for  medication, schedule management with occasional min A from family outside of therapy over 2 sessions    Baseline  09/23/17    Time  1    Period  Weeks    Status  Partially Met       SLP Long Term Goals - 09/23/17 1459      SLP LONG TERM GOAL #1   Title  Pt will alternate attention between 2 mildly complex cognitive linguistic tasksk with 85% on each and occasional min A    Time  4    Period  Weeks    Status  On-going      SLP LONG TERM GOAL #2   Title  Pt will self correct errors 4/5 errors with occasional min A    Time  4    Period  Weeks    Status  On-going      SLP LONG TERM GOAL #3   Title  Pt will recall details from paragraph, attending to the left, with 80% accuracy and occasional min A    Time  4    Period  Weeks    Status  On-going       Plan - 09/23/17 1458    Clinical Impression Statement  Pt with improved affect, mood and conversations. Due to visual perceptual  impairment, focused on auditory/mental math for working memory, attention and reasoning with min to mod A. Continue skilled ST to maximize safety and reduce caregiver burden    Speech Therapy Frequency  2x / week    Treatment/Interventions  Environmental controls;Cueing hierarchy;SLP instruction and feedback;Cognitive reorganization;Functional tasks;Compensatory strategies;Internal/external aids;Patient/family education    Potential to Achieve Goals  Good    Consulted and Agree with Plan of Care  Patient;Family member/caregiver    Family Member Consulted  spouse       Patient will benefit from skilled therapeutic intervention in order to improve the following deficits and impairments:   Cognitive communication deficit    Problem List Patient Active Problem List   Diagnosis Date Noted  . Acute ischemic right MCA stroke ([place]) 07/26/2017  . Hyperlipidemia 07/23/2017  . Diastolic dysfunction   . Acute blood loss anemia   . Essential hypertension 07/22/2017  . Type 2 diabetes mellitus with renal manifestations ([place]) 07/22/2017  . Acute CVA (cerebrovascular accident) ([place]) 07/21/2017  . Chronic kidney disease, stage III (moderate) ([place]) 02/19/2013  . Atherosclerotic peripheral vascular disease ([place]) 02/19/2013  . Pure hypercholesterolemia 02/19/2013  . Diabetes mellitus, stable ([place]) 02/05/2013    Lovvorn, Annye Rusk MS, [place] 09/23/2017, 3:00 PM  [facility] [address] [place], [place], [address] Phone: [phone]   Fax:  [phone]   Name: Eric Long MRN: [id number] Date of Birth: Oct 26, 1940

## 2017-09-23 NOTE — Patient Instructions (Signed)
Perform in corner with chair in front of you OR at kitchen sink with chair behind you for safety: perform with someone there with you.  Feet Apart, Head Motion - Eyes Open    Hold chair as needed. With eyes open, feet apart, move head slowly: up and down 5 times and side to side 5 times. Repeat __3__ times per session. Do __1__ sessions per day.  Copyright  VHI. All rights reserved.  Feet Apart, Varied Arm Positions - Eyes Closed    Stand with feet shoulder width apart and arms at your side or holding chair as needed. Close eyes and visualize upright position. Hold __20-30__ seconds. Repeat __3__ times per session. Do __1__ sessions per day.  Copyright  VHI. All rights reserved.  Feet Together, Varied Arm Positions - Eyes Open    With eyes open, feet together, arms at your side and holding counter as needed, look straight ahead at a stationary object. Hold __30__ seconds. Repeat __3__ times per session. Do __1__ sessions per day.  Copyright  VHI. All rights reserved.  Single Leg - Eyes Open    Holding support, lift right leg while maintaining balance over other leg. Progress to removing hands from support surface for longer periods of time. Hold_10-30___ seconds. Repeat with other leg, might need to block left leg to ensure safety. Repeat __3__ times per session per leg. Do __1__ sessions per day.  Copyright  VHI. All rights reserved.   Sit to Stand: Head Upright    With head upright, stand up slowly with eyes open.  Keep left foot straight (family can sit beside you and help your foot maintain position).  Try to stand without using hands.  Lean forward and sit slowly.   Repeat _10___ times per session. Do _2___ sessions per day.  Copyright  VHI. All rights reserved.   Bridge    Lie back, legs bent. Tuck in stomach, life hips up towards ceiling. Hold for 2 seconds, then slowly lower back down. Repeat __15__ times. Do 3 sets per session. Do __3__ sessions per  week.  http://pm.exer.us/54   Copyright  VHI. All rights reserved.   Knee High    Do this forward along the counter top at home and hold counter with one hand.  Have [name] beside you to help.  March slowly forward, turn when you get to the end and march to the other end.  Do 4 laps down and back.   Repeat __1__ times. Do __1-2__ sessions per day.  http://gt2.exer.us/767   Copyright  VHI. All rights reserved.   HIP: Abduction - Standing    Squeeze glutes. Raise leg out and slightly back.  Stay tall and keep feet pointed forward towards counter.  Stay tall.  __10_ reps per set, _1-2__ sets per day, _5-7__ days per week Hold onto a support.  Copyright  VHI. All rights reserved.

## 2017-09-23 NOTE — Therapy (Signed)
[facility] [address] [place], [place], [address] Phone: [phone]   Fax:  [phone]  Physical Therapy Treatment  Patient Details  Name: Eric Long MRN: [id number] Date of Birth: 04/21/1941 Referring Provider: Dr. Letta Pate   Encounter Date: 09/23/2017  PT End of Session - 09/23/17 1539    Visit Number  10    Number of Visits  17    Date for PT Re-Evaluation  10/11/17    Authorization Type  [employer]    PT Start Time  1450    PT Stop Time  1532    PT Time Calculation (min)  42 min    Equipment Utilized During Treatment  Gait belt    Activity Tolerance  Patient tolerated treatment well    Behavior During Therapy  [facility] for tasks assessed/performed       Past Medical History:  Diagnosis Date  . Diabetes mellitus without complication ([place])   . Hyperlipidemia     Past Surgical History:  Procedure Laterality Date  . FEMORAL-FEMORAL BYPASS GRAFT Bilateral 2007    There were no vitals filed for this visit.  Subjective Assessment - 09/23/17 1455    Subjective  Pt reports no changes since last visit, no pain.     Pertinent History  DM, HLD, PVD, CKD (stage III)    Patient Stated Goals  Return to IND living, walk without concern, build up stamina     Currently in Pain?  No/denies                       [facility] Adult PT Treatment/Exercise - 09/23/17 1456      Standardized Balance Assessment   Standardized Balance Assessment  Berg Balance Test      Berg Balance Test   Sit to Stand  Able to stand without using hands and stabilize independently    Standing Unsupported  Able to stand safely 2 minutes    Sitting with Back Unsupported but Feet Supported on Floor or Stool  Able to sit safely and securely 2 minutes    Stand to Sit  Sits safely with minimal use of hands    Transfers  Able to transfer safely, definite need of hands    Standing Unsupported with Eyes Closed  Able to stand 10  seconds with supervision    Standing Ubsupported with Feet Together  Able to place feet together independently and stand for 1 minute with supervision    From Standing, Reach Forward with Outstretched Arm  Can reach forward >12 cm safely (5") 5" with RUE    From Standing Position, Pick up Object from Floor  Able to pick up shoe, needs supervision    From Standing Position, Turn to Look Behind Over each Shoulder  Needs assist to keep from losing balance and falling then progressed to turning head without LOB    Turn 360 Degrees  Needs close supervision or verbal cueing    Standing Unsupported, Alternately Place Feet on Step/Stool  Able to complete 4 steps without aid or supervision    Standing Unsupported, One Foot in Front  Able to take small step independently and hold 30 seconds    Standing on One Leg  Tries to lift leg/unable to hold 3 seconds but remains standing independently    Total Score  37      Therex and Neuro re-ed: Pt performed strengthening and balance HEP with S and minimal cues for improved mechanics during  hip abd. No pain reported. Pt utilized chair for balance prn.  Please see pt instructions for HEP details.        PT Education - 09/23/17 1538    Education provided  Yes    Education Details  PT discussed STG progress and BERG score and high falls risk.     Person(s) Educated  Patient    Methods  Explanation    Comprehension  Verbalized understanding       PT Short Term Goals - 09/23/17 1544      PT SHORT TERM GOAL #1   Title  Pt will perform HEP with S to improve balance, endurance, strength, and safety. TARGET DATE FOR ALL STGS: 09/09/17    Status  Achieved      PT SHORT TERM GOAL #2   Title  Pt will perform TUG with LRAD, with S,  in </=25 sec. to decr. falls risk    Baseline  36.37 secs with RW, 19.65 secs without device min/guard A for safety.     Status  Achieved      PT SHORT TERM GOAL #3   Title  Pt will improve gait speed with LRAD to >/=1.26f/sec.  to decr. falls risk.     Baseline  2.04 ft/sec with RW, 1.66 ft/sec without device    Status  Achieved      PT SHORT TERM GOAL #4   Title  Perform BERG and write STG and LTG as indicated.     Status  Achieved      PT SHORT TERM GOAL #5   Title  Pt will amb. 300' over even terrain with LRAD and S to improve safety during functional mobility.     Baseline  not met in clinic on 09/22/17, min/guard with max cues for walker negotiation.     Status  Not Met      PT SHORT TERM GOAL #6   Title  Pt will improve BERG score to >/=35/56 to decr. falls risk.     Baseline  37/56 on 09/23/17    Status  Partially Met        PT Long Term Goals - 08/26/17 1110      PT LONG TERM GOAL #1   Title  Pt will verbalize understanding of CVA risk factors and s/s to decr. risk of CVA. (TARGET DATE FOR ALL LTGS: 10/07/17)    Status  New      PT LONG TERM GOAL #2   Title  Pt will be IND with HEP to improve strength, safety, balance, and endurance.     Status  New      PT LONG TERM GOAL #3   Title  Pt will perform TUG with LRAD, at MOD I level, in </=13.5 sec. to decr. falls risk.     Status  New      PT LONG TERM GOAL #4   Title  Pt will improve gait speed to >/=2.63fsec. with LRAD to amb. safely in the community.     Status  New      PT LONG TERM GOAL #5   Title  Pt will amb. 600' over uneven terrain with LRAD at MOD I level to improve functional mobility and safety.     Status  New      Additional Long Term Goals   Additional Long Term Goals  Yes      PT LONG TERM GOAL #6   Title  Pt will improve BERG score to >/=39/56  to decr. falls risk.     Status  New            Plan - 09/23/17 1539    Clinical Impression Statement  Pt demonstrated progress as he met STGs 1 and 6. Pt's BERG goal still indicates pt is at a risk for falls. Pt would continue to benefit from skilled therapy to improve safety during functional mobility.     Rehab Potential  Good    Clinical Impairments Affecting Rehab  Potential  see above.     PT Frequency  2x / week    PT Duration  8 weeks    PT Treatment/Interventions  ADLs/Self Care Home Management;Biofeedback;Canalith Repostioning;Electrical Stimulation;Therapeutic exercise;Therapeutic activities;Manual techniques;Wheelchair mobility training;Vestibular;Functional mobility training;Orthotic Fit/Training;Stair training;Gait training;Patient/family education;Cognitive remediation;DME Instruction;Neuromuscular re-education;Balance training    PT Next Visit Plan  add  flexibility to HEP as able (later), continue gait with RW,esp turns.  Gait without device unless outdoors    Consulted and Agree with Plan of Care  Patient;Family member/caregiver    Family Member Consulted  pt's wife ([name]?), [name], [name]: dtr       Patient will benefit from skilled therapeutic intervention in order to improve the following deficits and impairments:  Abnormal gait, Decreased endurance, Impaired sensation, Postural dysfunction, Impaired flexibility, Impaired tone, Decreased coordination, Decreased safety awareness, Decreased cognition, Decreased range of motion, Dizziness, Decreased balance, Decreased mobility, Decreased activity tolerance, Decreased knowledge of use of DME, Decreased strength, Impaired UE functional use  Visit Diagnosis: Hemiplegia and hemiparesis following cerebral infarction affecting left non-dominant side (HCC)  Unsteadiness on feet  Other lack of coordination     Problem List Patient Active Problem List   Diagnosis Date Noted  . Acute ischemic right MCA stroke ([place]) 07/26/2017  . Hyperlipidemia 07/23/2017  . Diastolic dysfunction   . Acute blood loss anemia   . Essential hypertension 07/22/2017  . Type 2 diabetes mellitus with renal manifestations ([place]) 07/22/2017  . Acute CVA (cerebrovascular accident) ([place]) 07/21/2017  . Chronic kidney disease, stage III (moderate) ([place]) 02/19/2013  . Atherosclerotic peripheral vascular disease ([place])  02/19/2013  . Pure hypercholesterolemia 02/19/2013  . Diabetes mellitus, stable ([place]) 02/05/2013    Graysin Luczynski L 09/23/2017, 3:47 PM  [facility] [address] [place], [place], [address] Phone: [phone]   Fax:  [phone]  Name: DONEVIN SAINSBURY MRN: [id number] Date of Birth: January 18, 1941  Geoffry Paradise, PT,DPT 09/23/17 3:48 PM Phone: [phone] Fax: [phone]

## 2017-09-24 DIAGNOSIS — E119 Type 2 diabetes mellitus without complications: Secondary | ICD-10-CM | POA: Diagnosis not present

## 2017-09-24 DIAGNOSIS — H35033 Hypertensive retinopathy, bilateral: Secondary | ICD-10-CM | POA: Diagnosis not present

## 2017-09-24 DIAGNOSIS — H40023 Open angle with borderline findings, high risk, bilateral: Secondary | ICD-10-CM | POA: Diagnosis not present

## 2017-09-24 DIAGNOSIS — H1851 Endothelial corneal dystrophy: Secondary | ICD-10-CM | POA: Diagnosis not present

## 2017-09-24 DIAGNOSIS — Z794 Long term (current) use of insulin: Secondary | ICD-10-CM | POA: Diagnosis not present

## 2017-09-24 DIAGNOSIS — H2513 Age-related nuclear cataract, bilateral: Secondary | ICD-10-CM | POA: Diagnosis not present

## 2017-09-24 DIAGNOSIS — H53453 Other localized visual field defect, bilateral: Secondary | ICD-10-CM | POA: Diagnosis not present

## 2017-09-30 ENCOUNTER — Ambulatory Visit: Payer: Medicare HMO | Admitting: Occupational Therapy

## 2017-09-30 ENCOUNTER — Encounter: Payer: Self-pay | Admitting: Speech Pathology

## 2017-09-30 ENCOUNTER — Ambulatory Visit: Payer: Medicare HMO | Admitting: Speech Pathology

## 2017-09-30 ENCOUNTER — Ambulatory Visit: Payer: Medicare HMO

## 2017-09-30 DIAGNOSIS — I69354 Hemiplegia and hemiparesis following cerebral infarction affecting left non-dominant side: Secondary | ICD-10-CM | POA: Diagnosis not present

## 2017-09-30 DIAGNOSIS — R414 Neurologic neglect syndrome: Secondary | ICD-10-CM | POA: Diagnosis not present

## 2017-09-30 DIAGNOSIS — R2681 Unsteadiness on feet: Secondary | ICD-10-CM | POA: Diagnosis not present

## 2017-09-30 DIAGNOSIS — R278 Other lack of coordination: Secondary | ICD-10-CM | POA: Diagnosis not present

## 2017-09-30 DIAGNOSIS — R41841 Cognitive communication deficit: Secondary | ICD-10-CM

## 2017-09-30 DIAGNOSIS — R482 Apraxia: Secondary | ICD-10-CM | POA: Diagnosis not present

## 2017-09-30 DIAGNOSIS — R208 Other disturbances of skin sensation: Secondary | ICD-10-CM | POA: Diagnosis not present

## 2017-09-30 DIAGNOSIS — R29818 Other symptoms and signs involving the nervous system: Secondary | ICD-10-CM | POA: Diagnosis not present

## 2017-09-30 DIAGNOSIS — R2689 Other abnormalities of gait and mobility: Secondary | ICD-10-CM | POA: Diagnosis not present

## 2017-09-30 DIAGNOSIS — I69318 Other symptoms and signs involving cognitive functions following cerebral infarction: Secondary | ICD-10-CM | POA: Diagnosis not present

## 2017-09-30 DIAGNOSIS — R41842 Visuospatial deficit: Secondary | ICD-10-CM | POA: Diagnosis not present

## 2017-09-30 NOTE — Therapy (Signed)
[facility] [address] [place], [place], [address] Phone: [phone]   Fax:  [phone]  Physical Therapy Treatment  Patient Details  Name: Eric Long MRN: [id number] Date of Birth: 10-06-1940 Referring Provider: Dr. Letta Pate   Encounter Date: 09/30/2017  PT End of Session - 09/30/17 1445    Visit Number  11    Number of Visits  17    Date for PT Re-Evaluation  10/11/17    Authorization Type  [employer]    PT Start Time  2505    PT Stop Time  1444    PT Time Calculation (min)  40 min    Equipment Utilized During Treatment  -- min guard to S prn    Activity Tolerance  Patient tolerated treatment well    Behavior During Therapy  [facility] for tasks assessed/performed       Past Medical History:  Diagnosis Date  . Diabetes mellitus without complication ([place])   . Hyperlipidemia     Past Surgical History:  Procedure Laterality Date  . FEMORAL-FEMORAL BYPASS GRAFT Bilateral 2007    There were no vitals filed for this visit.  Subjective Assessment - 09/30/17 1406    Subjective  Pt reports no changes since last visit, no pain. Later in session, pt reported his back feels tight in the morning.     Patient is accompained by:  -- wife: lobby    Pertinent History  DM, HLD, PVD, CKD (stage III)    Patient Stated Goals  Return to IND living, walk without concern, build up stamina     Currently in Pain?  No/denies        Therex: Pt performed seated and supine stretches with cues and demo for technique. No c/o of pain. Please see pt instructions for HEP details.                [facility] Adult PT Treatment/Exercise - 09/30/17 1420      Bed Mobility   Bed Mobility  Left Sidelying to Sit;Sit to Sidelying Left    Left Sidelying to Sit  4: Min assist;HOB flat    Left Sidelying to Sit Details (indicate cue type and reason)  Cues for sequencing and technique to protect low back and to guide trunk and LEs  off EOB.    Sit to Sidelying Left  4: Min assist    Sit to Sidelying Left Details (indicate cue type and reason)  Cues and demo for technique. See above for details.       Ambulation/Gait   Ambulation/Gait  Yes    Ambulation/Gait Assistance  4: Min guard;5: Supervision    Ambulation/Gait Assistance Details  Cues to improve sequencing during  180 degree turns and to traverse corners (stay inside RW). Cues to decr. L foot ER.     Ambulation Distance (Feet)  350 Feet    Assistive device  Rolling walker    Gait Pattern  Step-through pattern;Decreased stride length;Decreased dorsiflexion - left;Decreased weight shift to left;Decreased trunk rotation    Ambulation Surface  Level;Indoor             PT Education - 09/30/17 1445    Education provided  Yes    Education Details  PT provided pt with stretching HEP, as pt used to perform yoga and reported back stiffness in morning. PT educated pt on proper sequencing during turns with RW.     Person(s) Educated  Patient;Spouse informed spouse after session  Methods  Explanation;Tactile cues;Demonstration;Verbal cues;Handout    Comprehension  Returned demonstration;Verbalized understanding;Need further instruction       PT Short Term Goals - 09/23/17 1544      PT SHORT TERM GOAL #1   Title  Pt will perform HEP with S to improve balance, endurance, strength, and safety. TARGET DATE FOR ALL STGS: 09/09/17    Status  Achieved      PT SHORT TERM GOAL #2   Title  Pt will perform TUG with LRAD, with S,  in </=25 sec. to decr. falls risk    Baseline  36.37 secs with RW, 19.65 secs without device min/guard A for safety.     Status  Achieved      PT SHORT TERM GOAL #3   Title  Pt will improve gait speed with LRAD to >/=1.68f/sec. to decr. falls risk.     Baseline  2.04 ft/sec with RW, 1.66 ft/sec without device    Status  Achieved      PT SHORT TERM GOAL #4   Title  Perform BERG and write STG and LTG as indicated.     Status  Achieved       PT SHORT TERM GOAL #5   Title  Pt will amb. 300' over even terrain with LRAD and S to improve safety during functional mobility.     Baseline  not met in clinic on 09/22/17, min/guard with max cues for walker negotiation.     Status  Not Met      PT SHORT TERM GOAL #6   Title  Pt will improve BERG score to >/=35/56 to decr. falls risk.     Baseline  37/56 on 09/23/17    Status  Partially Met        PT Long Term Goals - 08/26/17 1110      PT LONG TERM GOAL #1   Title  Pt will verbalize understanding of CVA risk factors and s/s to decr. risk of CVA. (TARGET DATE FOR ALL LTGS: 10/07/17)    Status  New      PT LONG TERM GOAL #2   Title  Pt will be IND with HEP to improve strength, safety, balance, and endurance.     Status  New      PT LONG TERM GOAL #3   Title  Pt will perform TUG with LRAD, at MOD I level, in </=13.5 sec. to decr. falls risk.     Status  New      PT LONG TERM GOAL #4   Title  Pt will improve gait speed to >/=2.63fsec. with LRAD to amb. safely in the community.     Status  New      PT LONG TERM GOAL #5   Title  Pt will amb. 600' over uneven terrain with LRAD at MOD I level to improve functional mobility and safety.     Status  New      Additional Long Term Goals   Additional Long Term Goals  Yes      PT LONG TERM GOAL #6   Title  Pt will improve BERG score to >/=39/56 to decr. falls risk.     Status  New            Plan - 09/30/17 1446    Clinical Impression Statement  Pt demonstrated progress, as he was able to correct gait pattern during turns by keeping BLEs inside RW, moving RW then taking steps to ensure safety. Pt also  able to correct L foot ER with cues. Pt demonstrated ability to improve sidelying<>sit txfs with demo and cues. Continue with POC.     Rehab Potential  Good    Clinical Impairments Affecting Rehab Potential  see above.     PT Frequency  2x / week    PT Duration  8 weeks    PT Treatment/Interventions  ADLs/Self Care Home  Management;Biofeedback;Canalith Repostioning;Electrical Stimulation;Therapeutic exercise;Therapeutic activities;Manual techniques;Wheelchair mobility training;Vestibular;Functional mobility training;Orthotic Fit/Training;Stair training;Gait training;Patient/family education;Cognitive remediation;DME Instruction;Neuromuscular re-education;Balance training    PT Next Visit Plan  continue gait with RW,esp turns.  Gait without device unless outdoors    Consulted and Agree with Plan of Care  Patient;Family member/caregiver    Family Member Consulted  pt's wife ([name]?), [name], [name]: dtr       Patient will benefit from skilled therapeutic intervention in order to improve the following deficits and impairments:  Abnormal gait, Decreased endurance, Impaired sensation, Postural dysfunction, Impaired flexibility, Impaired tone, Decreased coordination, Decreased safety awareness, Decreased cognition, Decreased range of motion, Dizziness, Decreased balance, Decreased mobility, Decreased activity tolerance, Decreased knowledge of use of DME, Decreased strength, Impaired UE functional use  Visit Diagnosis: Hemiplegia and hemiparesis following cerebral infarction affecting left non-dominant side (HCC)  Unsteadiness on feet  Other lack of coordination     Problem List Patient Active Problem List   Diagnosis Date Noted  . Acute ischemic right MCA stroke ([place]) 07/26/2017  . Hyperlipidemia 07/23/2017  . Diastolic dysfunction   . Acute blood loss anemia   . Essential hypertension 07/22/2017  . Type 2 diabetes mellitus with renal manifestations ([place]) 07/22/2017  . Acute CVA (cerebrovascular accident) ([place]) 07/21/2017  . Chronic kidney disease, stage III (moderate) ([place]) 02/19/2013  . Atherosclerotic peripheral vascular disease ([place]) 02/19/2013  . Pure hypercholesterolemia 02/19/2013  . Diabetes mellitus, stable ([place]) 02/05/2013    Shyanne Mcclary L 09/30/2017, 2:48 PM  [facility] [address] [place], [place], [address] Phone: [phone]   Fax:  [phone]  Name: Eric Long MRN: [id number] Date of Birth: 1940/10/06  Geoffry Paradise, PT,DPT 09/30/17 2:49 PM Phone: [phone] Fax: [phone]

## 2017-09-30 NOTE — Patient Instructions (Addendum)
Piriformis Stretch, Sitting    Sit, one ankle on opposite knee, same-side hand on crossed knee. Push down on knee, keeping spine straight. Lean torso forward, with flat back, until tension is felt in hamstrings and gluteals of crossed-leg side. Hold _30__ seconds.  Repeat _3__ times per session. Do _1-2__ sessions per day.  Copyright  VHI. All rights reserved.   HIP: Hamstrings - Short Sitting    Rest leg on raised surface. Keep knee straight. Lift chest and bend forward at the hips. Keep looking straight ahead. Hold _30__ seconds. __3_ reps per set, __1-2_ sets per day, __7_ days per week.  Copyright  VHI. All rights reserved.    Lower Trunk Rotation Stretch    Keeping back flat and feet together, rotate knees to left side. Hold _30___ seconds. Repeat to the other side.  Repeat __3__ times per set. Do __1__ sets per session. Do _1-2___ sessions per day.  http://orth.exer.us/122   Copyright  VHI. All rights reserved.

## 2017-09-30 NOTE — Therapy (Signed)
[facility] [address] [place], [place], [address] Phone: [phone]   Fax:  [phone]  Speech Language Pathology Treatment  Patient Details  Name: Eric Long MRN: [id number] Date of Birth: [birth year] Referring Provider: Dr. Alysia Penna   Encounter Date: 09/30/2017  End of Session - 09/30/17 1545    Visit Number  10    Number of Visits  17    Date for SLP Re-Evaluation  10/10/17    SLP Start Time  1316    SLP Stop Time   1358    SLP Time Calculation (min)  42 min    Activity Tolerance  Patient tolerated treatment well       Past Medical History:  Diagnosis Date  . Diabetes mellitus without complication ([place])   . Hyperlipidemia     Past Surgical History:  Procedure Laterality Date  . FEMORAL-FEMORAL BYPASS GRAFT Bilateral 2007    There were no vitals filed for this visit.  Subjective Assessment - 09/30/17 1323    Subjective  "My notebook is at home since didn't get any homework last time"    Currently in Pain?  No/denies            ADULT SLP TREATMENT - 09/30/17 1323      General Information   Behavior/Cognition  Alert;Cooperative      Treatment Provided   Treatment provided  Cognitive-Linquistic      Cognitive-Linquistic Treatment   Treatment focused on  Cognition    Skilled Treatment  Facilitated verbal reasoning/executive functions verbalizing similarities/differences with rare min A. Alternating attention between visual task (card sort) and simple conversation with usual min A to return to his place and recall rules. Overall processing slow.      Assessment / Recommendations / Plan   Plan  Continue with current plan of care      Progression Toward Goals   Progression toward goals  Progressing toward goals slow progress         SLP Short Term Goals - 09/30/17 1545      SLP SHORT TERM GOAL #1   Title  Pt will selectively attend to mildly complex cognitive linguistic  task with distractions and occasional min redirection    Baseline  09/02/17; 09/04/17, 09/15/17    Time  1    Period  Weeks    Status  Achieved      SLP SHORT TERM GOAL #2   Title  Pt will complete mildy complex organization, reasoning, problem sovling tasks with 85% accuracy and occasional min A    Time  1    Period  Weeks    Status  Not Met      SLP SHORT TERM GOAL #3   Title  Pt will ID errors and self correct 3/5 errors with occasional min A    Time  1    Period  Weeks    Status  Not Met      SLP SHORT TERM GOAL #4   Title  Pt will utilize external aids for medication, schedule management with occasional min A from family outside of therapy over 2 sessions    Baseline  09/23/17    Time  1    Period  Weeks    Status  Partially Met       SLP Long Term Goals - 09/30/17 1545      SLP LONG TERM GOAL #1   Title  Pt will alternate attention between 2 mildly complex  cognitive linguistic tasksk with 85% on each and occasional min A    Time  3    Period  Weeks    Status  On-going      SLP LONG TERM GOAL #2   Title  Pt will self correct errors 4/5 errors with occasional min A    Time  3    Period  Weeks    Status  On-going      SLP LONG TERM GOAL #3   Title  Pt will recall details from paragraph, attending to the left, with 80% accuracy and occasional min A    Time  3    Period  Weeks    Status  On-going       Plan - 09/30/17 1543    Clinical Impression Statement  Pt continues to present with significant attention, awareness and executive function impairments affecting ability to be independent or left alone at home. I  reocmmend pt continue skilled ST to maximize cognition to reduce caregiver burden and improve safety    Speech Therapy Frequency  2x / week    Treatment/Interventions  Environmental controls;Cueing hierarchy;SLP instruction and feedback;Cognitive reorganization;Functional tasks;Compensatory strategies;Internal/external aids;Patient/family education     Potential to Achieve Goals  Good       Patient will benefit from skilled therapeutic intervention in order to improve the following deficits and impairments:   Cognitive communication deficit    Problem List Patient Active Problem List   Diagnosis Date Noted  . Acute ischemic right MCA stroke ([place]) 07/26/2017  . Hyperlipidemia 07/23/2017  . Diastolic dysfunction   . Acute blood loss anemia   . Essential hypertension 07/22/2017  . Type 2 diabetes mellitus with renal manifestations ([place]) 07/22/2017  . Acute CVA (cerebrovascular accident) ([place]) 07/21/2017  . Chronic kidney disease, stage III (moderate) ([place]) 02/19/2013  . Atherosclerotic peripheral vascular disease ([place]) 02/19/2013  . Pure hypercholesterolemia 02/19/2013  . Diabetes mellitus, stable ([place]) 02/05/2013    Lovvorn, Annye Rusk  MS, CCC-SLP 09/30/2017, 3:47 PM  [facility] [address] [place], [place], [address] Phone: [phone]   Fax:  [phone]   Name: Eric Long MRN: [id number] Date of Birth: 1940/05/30

## 2017-10-02 ENCOUNTER — Encounter: Payer: Self-pay | Admitting: Occupational Therapy

## 2017-10-02 ENCOUNTER — Ambulatory Visit: Payer: Medicare HMO | Admitting: Occupational Therapy

## 2017-10-02 ENCOUNTER — Ambulatory Visit: Payer: Medicare HMO

## 2017-10-02 ENCOUNTER — Encounter: Payer: Self-pay | Admitting: Speech Pathology

## 2017-10-02 ENCOUNTER — Ambulatory Visit: Payer: Medicare HMO | Admitting: Speech Pathology

## 2017-10-02 VITALS — BP 139/70 | HR 91

## 2017-10-02 DIAGNOSIS — R2689 Other abnormalities of gait and mobility: Secondary | ICD-10-CM

## 2017-10-02 DIAGNOSIS — I69318 Other symptoms and signs involving cognitive functions following cerebral infarction: Secondary | ICD-10-CM

## 2017-10-02 DIAGNOSIS — I69354 Hemiplegia and hemiparesis following cerebral infarction affecting left non-dominant side: Secondary | ICD-10-CM

## 2017-10-02 DIAGNOSIS — R278 Other lack of coordination: Secondary | ICD-10-CM | POA: Diagnosis not present

## 2017-10-02 DIAGNOSIS — R208 Other disturbances of skin sensation: Secondary | ICD-10-CM | POA: Diagnosis not present

## 2017-10-02 DIAGNOSIS — R482 Apraxia: Secondary | ICD-10-CM | POA: Diagnosis not present

## 2017-10-02 DIAGNOSIS — R29818 Other symptoms and signs involving the nervous system: Secondary | ICD-10-CM | POA: Diagnosis not present

## 2017-10-02 DIAGNOSIS — R41841 Cognitive communication deficit: Secondary | ICD-10-CM

## 2017-10-02 DIAGNOSIS — R414 Neurologic neglect syndrome: Secondary | ICD-10-CM | POA: Diagnosis not present

## 2017-10-02 DIAGNOSIS — R41842 Visuospatial deficit: Secondary | ICD-10-CM | POA: Diagnosis not present

## 2017-10-02 DIAGNOSIS — R2681 Unsteadiness on feet: Secondary | ICD-10-CM

## 2017-10-02 NOTE — Therapy (Signed)
[facility] [address] [place], [place], [address] Phone: [phone]   Fax:  [phone]  Occupational Therapy Treatment  Patient Details  Name: Eric Long MRN: [id number] Date of Birth: 1941-01-14 Referring Provider: Dr. Letta Pate   Encounter Date: 10/02/2017  OT End of Session - 10/02/17 1642    Visit Number  11    Number of Visits  16    Date for OT Re-Evaluation  10/07/17    Authorization Type  medicare    Authorization Time Period  90 days    OT Start Time  1316    OT Stop Time  1359    OT Time Calculation (min)  43 min    Activity Tolerance  Patient tolerated treatment well       Past Medical History:  Diagnosis Date  . Diabetes mellitus without complication ([place])   . Hyperlipidemia     Past Surgical History:  Procedure Laterality Date  . FEMORAL-FEMORAL BYPASS GRAFT Bilateral 2007    There were no vitals filed for this visit.  Subjective Assessment - 10/02/17 1319    Subjective   Dressing is still not perfect    Patient is accompained by:  Family member wife    Pertinent History  R MCA CVA, acute encephalopathy, severe L carotid stenosis.      Patient Stated Goals  back to where I am stable with walking, use the computer, continue reading and writing.     Currently in Pain?  No/denies                   OT Treatments/Exercises (OP) - 10/02/17 0001      ADLs   ADL Comments  Addressed UB and LB Dressing - pt with significant improvement - pt required only 1 questioning cue for UB dressing (what are the landmarks you are looking for?") and no assistance for doffing shirt.  Pt required min cues only for LB dressing - pt continues to have difficulty with clothing orientation however able to complete with only minimal cueing. Wife reports that at times pt will put both legs into one pant leg and does not appear to be aware.  Discussed having pt dress in front of a mirror and checking  before he stands up to  pull up pants; also educated wife in using questioning cue only ("Are you sure those pants are on rigth before you stand?"), and having pt always RLE into pants first. Pt had no difficulty with doffing pants or with doffing or donning socks and shoes.        Visual/Perceptual Exercises   Letter Search  completed letter cancellation at 41M level with paper taped to midline - pt with 98% accuracy, improved speed and improved ability to find the next line down visually.                OT Short Term Goals - 10/02/17 1640      OT SHORT TERM GOAL #1   Title  Pt and family will be mod I for home activities program for L field cut/L neglect - 09/09/2017    Status  Achieved      OT SHORT TERM GOAL #2   Title  Assessed 9 hole peg and determine goal if appropriate.    Status  Achieved see PN      OT SHORT TERM GOAL #3   Title  Assess Box and blocks and determine goal if appropriate    Status  Achieved L= 38      OT SHORT TERM GOAL #4   Title  Pt will be supevision for UB dressing     Status  Achieved      OT SHORT TERM GOAL #5   Title  Pt will need no more than 3 vc's for clothing orientation when dressing.    Status  Achieved      OT SHORT TERM GOAL #6   Title  Pt will be min a, mod cues for LB dressing    Status  Achieved      OT SHORT TERM GOAL #7   Title  Pt will locate items during functional tasks on L with no more than min cues     Status  On-going        OT Long Term Goals - 10/02/17 1640      OT LONG TERM GOAL #1   Title  Pt and family will be mod I upgraded HEP prn - 10/07/2017    Status  On-going      OT LONG TERM GOAL #2   Title  Pt will demonstrate envrionmental scanning during functional task with at least 90% accuracy    Status  On-going      OT LONG TERM GOAL #3   Title  Pt will be able to complete bilateral tasks with no more than min cues to attend to L hand    Status  Achieved      OT LONG TERM GOAL #4   Title  Pt will be mod I  with eating    Status  Achieved      OT LONG TERM GOAL #5   Title  Pt will be supervision for simple yard tasks.    Status  Deferred at pts request      OT LONG TERM GOAL #6   Title  Pt will be supervision with dressing    Status  Revised      OT LONG TERM GOAL #7   Title  Pt will be mod I with toilet and shower transfers    Status  On-going      OT LONG TERM GOAL #8   Title  Pt will be able to read simple paragraph with compensations and min cueing    Status  On-going            Plan - 10/02/17 1641    Clinical Impression Statement  Pt with slow but steady progress toward goals. Pt is able to verbalize what he needs to do but has difficulty at times executing.     Occupational Profile and client history currently impacting functional performance  PMH:  DM, HTN, HLD, chronic kidney disease, PVD, severe L extracranial carotid stenosis.      Occupational performance deficits (Please refer to evaluation for details):  ADL's;IADL's;Rest and Sleep;Leisure;Social Participation    Rehab Potential  Good    Current Impairments/barriers affecting progress:  signficant perceptual deficits, apraxia, cognitive deficits    OT Frequency  2x / week    OT Duration  8 weeks    OT Treatment/Interventions  Self-care/ADL training;Aquatic Therapy;Electrical Stimulation;DME and/or AE instruction;Neuromuscular education;Therapeutic exercise;Functional Mobility Training;Manual Therapy;Therapeutic activities;Cognitive remediation/compensation;Balance training;Patient/family education;Visual/perceptual remediation/compensation    Plan  NMR for  motor planning with  familiar functional tasks, environmental scanning, reading, NMR for LUE functional use and mobility, LB dressing    Consulted and Agree with Plan of Care  Patient;Family member/caregiver    Family Member Consulted  wife  Patient will benefit from skilled therapeutic intervention in order to improve the following deficits and  impairments:  Abnormal gait, Decreased activity tolerance, Decreased balance, Decreased coordination, Decreased cognition, Decreased knowledge of use of DME, Decreased mobility, Decreased safety awareness, Difficulty walking, Impaired UE functional use, Impaired sensation, Impaired vision/preception  Visit Diagnosis: Hemiplegia and hemiparesis following cerebral infarction affecting left non-dominant side (HCC)  Other lack of coordination  Unsteadiness on feet  Left-sided neglect  Visuospatial deficit  Other symptoms and signs involving cognitive functions following cerebral infarction  Other disturbances of skin sensation  Apraxia  Other symptoms and signs involving the nervous system    Problem List Patient Active Problem List   Diagnosis Date Noted  . Acute ischemic right MCA stroke ([place]) 07/26/2017  . Hyperlipidemia 07/23/2017  . Diastolic dysfunction   . Acute blood loss anemia   . Essential hypertension 07/22/2017  . Type 2 diabetes mellitus with renal manifestations ([place]) 07/22/2017  . Acute CVA (cerebrovascular accident) ([place]) 07/21/2017  . Chronic kidney disease, stage III (moderate) ([place]) 02/19/2013  . Atherosclerotic peripheral vascular disease ([place]) 02/19/2013  . Pure hypercholesterolemia 02/19/2013  . Diabetes mellitus, stable ([place]) 02/05/2013    Quay Burow, OTR/L 10/02/2017, 4:46 PM  [facility] [address] [place], [place], [address] Phone: [phone]   Fax:  [phone]  Name: Eric Long MRN: [id number] Date of Birth: 20-Jan-1941

## 2017-10-02 NOTE — Therapy (Signed)
[facility] [address] [place], [place], [address] Phone: [phone]   Fax:  [phone]  Speech Language Pathology Treatment  Patient Details  Name: Eric Long MRN: [id number] Date of Birth: Oct 21, 1940 Referring Provider: Dr. Alysia Penna   Encounter Date: 10/02/2017  End of Session - 10/02/17 1522    Visit Number  11    Number of Visits  17    Date for SLP Re-Evaluation  10/10/17    Authorization Type  60 total visits - if PT/OT/ST are on 1 day, it counts as 1 visit    SLP Start Time  1232    SLP Stop Time   1314    SLP Time Calculation (min)  42 min    Activity Tolerance  Patient tolerated treatment well       Past Medical History:  Diagnosis Date  . Diabetes mellitus without complication ([place])   . Hyperlipidemia     Past Surgical History:  Procedure Laterality Date  . FEMORAL-FEMORAL BYPASS GRAFT Bilateral 2007    There were no vitals filed for this visit.  Subjective Assessment - 10/02/17 1244    Subjective  "He is being safe at home"    Patient is accompained by:  Family member    Currently in Pain?  No/denies            ADULT SLP TREATMENT - 10/02/17 1246      General Information   Behavior/Cognition  Alert;Cooperative      Treatment Provided   Treatment provided  Cognitive-Linquistic      Cognitive-Linquistic Treatment   Treatment focused on  Cognition    Skilled Treatment  Altetnating attention between written naming task and auditory money counting task with usual repetition of  money amount and mod A for error awareness. Alternating attention betwen written naming task and auditory alphabetizing task with mod A for error awareness. Pt with 90% on written naming task, 75% on auditory/verbal money and alphabetizing task.       Assessment / Recommendations / Plan   Plan  Continue with current plan of care      Progression Toward Goals   Progression toward goals   Progressing toward goals         SLP Short Term Goals - 10/02/17 1521      SLP SHORT TERM GOAL #1   Title  Pt will selectively attend to mildly complex cognitive linguistic task with distractions and occasional min redirection    Baseline  09/02/17; 09/04/17, 09/15/17    Time  1    Period  Weeks    Status  Achieved      SLP SHORT TERM GOAL #2   Title  Pt will complete mildy complex organization, reasoning, problem sovling tasks with 85% accuracy and occasional min A    Time  1    Period  Weeks    Status  Not Met      SLP SHORT TERM GOAL #3   Title  Pt will ID errors and self correct 3/5 errors with occasional min A    Time  1    Period  Weeks    Status  Not Met      SLP SHORT TERM GOAL #4   Title  Pt will utilize external aids for medication, schedule management with occasional min A from family outside of therapy over 2 sessions    Baseline  09/23/17    Time  1    Period  Weeks    Status  Partially Met       SLP Long Term Goals - 10/02/17 1521      SLP LONG TERM GOAL #1   Title  Pt will alternate attention between 2 mildly complex cognitive linguistic tasksk with 85% on each and occasional min A    Time  3    Period  Weeks    Status  On-going      SLP LONG TERM GOAL #2   Title  Pt will self correct errors 4/5 errors with occasional min A    Time  3    Period  Weeks    Status  On-going      SLP LONG TERM GOAL #3   Title  Pt will recall details from paragraph, attending to the left, with 80% accuracy and occasional min A    Time  3    Period  Weeks    Status  On-going       Plan - 10/02/17 1518    Clinical Impression Statement  Pt continues to present with significant attention, awareness and executive function impairments affecting ability to be independent or left alone at home. However, some progress in attention and money/time problem solving. Poor awareness of deficits conintues to affect progress. I  reocmmend pt continue skilled ST to maximize cognition to  reduce caregiver burden and improve safety    Speech Therapy Frequency  2x / week    Treatment/Interventions  Environmental controls;Cueing hierarchy;SLP instruction and feedback;Cognitive reorganization;Functional tasks;Compensatory strategies;Internal/external aids;Patient/family education    Potential to Achieve Goals  Good    Potential Considerations  Ability to learn/carryover information;Severity of impairments    Consulted and Agree with Plan of Care  Patient;Family member/caregiver    Family Member Consulted  spouse       Patient will benefit from skilled therapeutic intervention in order to improve the following deficits and impairments:   Cognitive communication deficit    Problem List Patient Active Problem List   Diagnosis Date Noted  . Acute ischemic right MCA stroke ([place]) 07/26/2017  . Hyperlipidemia 07/23/2017  . Diastolic dysfunction   . Acute blood loss anemia   . Essential hypertension 07/22/2017  . Type 2 diabetes mellitus with renal manifestations ([place]) 07/22/2017  . Acute CVA (cerebrovascular accident) ([place]) 07/21/2017  . Chronic kidney disease, stage III (moderate) ([place]) 02/19/2013  . Atherosclerotic peripheral vascular disease ([place]) 02/19/2013  . Pure hypercholesterolemia 02/19/2013  . Diabetes mellitus, stable ([place]) 02/05/2013    Lovvorn, Lesslie, Ben Avon 10/02/2017, 3:22 PM  [facility] [address] [place], [place], [address] Phone: [phone]   Fax:  [phone]   Name: Eric Long MRN: [id number] Date of Birth: [birth year]

## 2017-10-02 NOTE — Therapy (Signed)
[facility] [address] [place], [place], [address] Phone: [phone]   Fax:  [phone]  Physical Therapy Treatment  Patient Details  Name: Eric Long MRN: [id number] Date of Birth: 1940-07-18 Referring Provider: Dr. Letta Pate   Encounter Date: 10/02/2017  PT End of Session - 10/02/17 1530    Visit Number  12    Number of Visits  17    Date for PT Re-Evaluation  10/11/17    Authorization Type  [employer]    PT Start Time  1404    PT Stop Time  1442    PT Time Calculation (min)  38 min    Equipment Utilized During Treatment  Gait belt    Activity Tolerance  Patient tolerated treatment well    Behavior During Therapy  [name] for tasks assessed/performed       Past Medical History:  Diagnosis Date  . Diabetes mellitus without complication ([place])   . Hyperlipidemia     Past Surgical History:  Procedure Laterality Date  . FEMORAL-FEMORAL BYPASS GRAFT Bilateral 2007    Vitals:   10/02/17 1428  BP: 139/70  Pulse: 91    Subjective Assessment - 10/02/17 1407    Subjective  Pt denied changes or falls since last visit. Pt reports back is less stiff since he's been performing the LTR stretch in the morning.     Patient is accompained by:  Family member wife: Eric Long    Pertinent History  DM, HLD, PVD, CKD (stage III)    Patient Stated Goals  Return to IND living, walk without concern, build up stamina     Currently in Pain?  No/denies                       [facility] Adult PT Treatment/Exercise - 10/02/17 1408      Ambulation/Gait   Ambulation/Gait  Yes    Ambulation/Gait Assistance  4: Min guard;5: Supervision;4: Min assist    Ambulation/Gait Assistance Details  Cues to stay within RW, decr. L foot ER, weight shifting while traversing inclines/declines, and for sequencing while turning. Min A for turning outdoors.  3 seated rest breaks required 2/2 fatigue.    Ambulation Distance (Feet)  230  Feet indoors, 200' outdoors, 40', 10x10'    Assistive device  Rolling walker    Gait Pattern  Step-through pattern;Decreased stride length;Decreased dorsiflexion - left;Decreased weight shift to left;Decreased trunk rotation    Ambulation Surface  Level;Indoor;Unlevel;Outdoor;Paved    Gait Comments  Pt performed 180 degree turns (r and L side) during amb. to improve safety and technique. Performed x 10 reps. Cues to stay within RW and for sequencing.                PT Short Term Goals - 09/23/17 1544      PT SHORT TERM GOAL #1   Title  Pt will perform HEP with S to improve balance, endurance, strength, and safety. TARGET DATE FOR ALL STGS: 09/09/17    Status  Achieved      PT SHORT TERM GOAL #2   Title  Pt will perform TUG with LRAD, with S,  in </=25 sec. to decr. falls risk    Baseline  36.37 secs with RW, 19.65 secs without device min/guard A for safety.     Status  Achieved      PT SHORT TERM GOAL #3   Title  Pt will improve gait speed with LRAD to >/=1.30f/sec.  to decr. falls risk.     Baseline  2.04 ft/sec with RW, 1.66 ft/sec without device    Status  Achieved      PT SHORT TERM GOAL #4   Title  Perform BERG and write STG and LTG as indicated.     Status  Achieved      PT SHORT TERM GOAL #5   Title  Pt will amb. 300' over even terrain with LRAD and S to improve safety during functional mobility.     Baseline  not met in clinic on 09/22/17, min/guard with max cues for walker negotiation.     Status  Not Met      PT SHORT TERM GOAL #6   Title  Pt will improve BERG score to >/=35/56 to decr. falls risk.     Baseline  37/56 on 09/23/17    Status  Partially Met        PT Long Term Goals - 08/26/17 1110      PT LONG TERM GOAL #1   Title  Pt will verbalize understanding of CVA risk factors and s/s to decr. risk of CVA. (TARGET DATE FOR ALL LTGS: 10/07/17)    Status  New      PT LONG TERM GOAL #2   Title  Pt will be IND with HEP to improve strength, safety, balance,  and endurance.     Status  New      PT LONG TERM GOAL #3   Title  Pt will perform TUG with LRAD, at MOD I level, in </=13.5 sec. to decr. falls risk.     Status  New      PT LONG TERM GOAL #4   Title  Pt will improve gait speed to >/=2.21f/sec. with LRAD to amb. safely in the community.     Status  New      PT LONG TERM GOAL #5   Title  Pt will amb. 600' over uneven terrain with LRAD at MOD I level to improve functional mobility and safety.     Status  New      Additional Long Term Goals   Additional Long Term Goals  Yes      PT LONG TERM GOAL #6   Title  Pt will improve BERG score to >/=39/56 to decr. falls risk.     Status  New            Plan - 10/02/17 1530    Clinical Impression Statement  Pt demonstrated progress, as he progressed to amb. around blue track indoors, with S only. Pt required incr. assist outdoors and to sequence during 180 degree turns with RW. Pt experienced less gait deviations when PT asked pt to list sequencing/gait technique prior to attempting task. Pt would continue to benefit from skilled PT to improve safety during functional mobility.     Rehab Potential  Good    Clinical Impairments Affecting Rehab Potential  see above.     PT Frequency  2x / week    PT Duration  8 weeks    PT Treatment/Interventions  ADLs/Self Care Home Management;Biofeedback;Canalith Repostioning;Electrical Stimulation;Therapeutic exercise;Therapeutic activities;Manual techniques;Wheelchair mobility training;Vestibular;Functional mobility training;Orthotic Fit/Training;Stair training;Gait training;Patient/family education;Cognitive remediation;DME Instruction;Neuromuscular re-education;Balance training    PT Next Visit Plan  continue gait with RW,esp turns.  Gait without device unless outdoors. Incorporate martial arts (weight shifting). Assess LTGs and renew.     Consulted and Agree with Plan of Care  Patient;Family member/caregiver    Family Member Consulted  pt's wife  (Eric Long?), [name], [name]: dtr       Patient will benefit from skilled therapeutic intervention in order to improve the following deficits and impairments:  Abnormal gait, Decreased endurance, Impaired sensation, Postural dysfunction, Impaired flexibility, Impaired tone, Decreased coordination, Decreased safety awareness, Decreased cognition, Decreased range of motion, Dizziness, Decreased balance, Decreased mobility, Decreased activity tolerance, Decreased knowledge of use of DME, Decreased strength, Impaired UE functional use  Visit Diagnosis: Hemiplegia and hemiparesis following cerebral infarction affecting left non-dominant side (HCC)  Other lack of coordination  Unsteadiness on feet  Other abnormalities of gait and mobility     Problem List Patient Active Problem List   Diagnosis Date Noted  . Acute ischemic right MCA stroke ([place]) 07/26/2017  . Hyperlipidemia 07/23/2017  . Diastolic dysfunction   . Acute blood loss anemia   . Essential hypertension 07/22/2017  . Type 2 diabetes mellitus with renal manifestations ([place]) 07/22/2017  . Acute CVA (cerebrovascular accident) ([place]) 07/21/2017  . Chronic kidney disease, stage III (moderate) ([place]) 02/19/2013  . Atherosclerotic peripheral vascular disease ([place]) 02/19/2013  . Pure hypercholesterolemia 02/19/2013  . Diabetes mellitus, stable ([place]) 02/05/2013    Miller,Jennifer L 10/02/2017, 3:33 PM  [facility] [address] [place], [place], [address] Phone: [phone]   Fax:  [phone]  Name: Eric Long MRN: [id number] Date of Birth: January 29, 1941  Geoffry Paradise, PT,DPT 10/02/17 3:33 PM Phone: [phone] Fax: [phone]

## 2017-10-07 ENCOUNTER — Encounter: Payer: Self-pay | Admitting: Occupational Therapy

## 2017-10-07 ENCOUNTER — Ambulatory Visit: Payer: Medicare HMO | Attending: Physical Medicine & Rehabilitation

## 2017-10-07 ENCOUNTER — Ambulatory Visit: Payer: Medicare HMO | Admitting: Occupational Therapy

## 2017-10-07 ENCOUNTER — Encounter: Payer: Self-pay | Admitting: Speech Pathology

## 2017-10-07 ENCOUNTER — Ambulatory Visit: Payer: Medicare HMO | Admitting: Speech Pathology

## 2017-10-07 DIAGNOSIS — I69354 Hemiplegia and hemiparesis following cerebral infarction affecting left non-dominant side: Secondary | ICD-10-CM

## 2017-10-07 DIAGNOSIS — R41841 Cognitive communication deficit: Secondary | ICD-10-CM

## 2017-10-07 DIAGNOSIS — R41842 Visuospatial deficit: Secondary | ICD-10-CM | POA: Insufficient documentation

## 2017-10-07 DIAGNOSIS — R278 Other lack of coordination: Secondary | ICD-10-CM | POA: Diagnosis not present

## 2017-10-07 DIAGNOSIS — R2689 Other abnormalities of gait and mobility: Secondary | ICD-10-CM | POA: Diagnosis present

## 2017-10-07 DIAGNOSIS — R2681 Unsteadiness on feet: Secondary | ICD-10-CM

## 2017-10-07 DIAGNOSIS — R414 Neurologic neglect syndrome: Secondary | ICD-10-CM | POA: Diagnosis not present

## 2017-10-07 DIAGNOSIS — I69318 Other symptoms and signs involving cognitive functions following cerebral infarction: Secondary | ICD-10-CM | POA: Insufficient documentation

## 2017-10-07 DIAGNOSIS — R208 Other disturbances of skin sensation: Secondary | ICD-10-CM | POA: Insufficient documentation

## 2017-10-07 DIAGNOSIS — R482 Apraxia: Secondary | ICD-10-CM | POA: Diagnosis not present

## 2017-10-07 DIAGNOSIS — R29818 Other symptoms and signs involving the nervous system: Secondary | ICD-10-CM | POA: Diagnosis not present

## 2017-10-07 NOTE — Therapy (Signed)
[facility] [address] [place], [place], [address] Phone: [phone]   Fax:  [phone]  Speech Language Pathology Treatment  Patient Details  Name: Eric Long MRN: [id number] Date of Birth: [birth year] Referring Provider: Dr. Alysia Penna   Encounter Date: 10/07/2017  End of Session - 10/07/17 1207    Visit Number  12    Number of Visits  17    Date for SLP Re-Evaluation  10/31/17    Authorization Type  60 total visits - if PT/OT/ST are on 1 day, it counts as 1 visit    Authorization Time Period   I extended re-eval date as pt was evald 4/9, but did not initiate ST until 4/25, 2 weeks later    SLP Start Time  1105    SLP Stop Time   1146    SLP Time Calculation (min)  41 min    Activity Tolerance  Patient tolerated treatment well       Past Medical History:  Diagnosis Date  . Diabetes mellitus without complication ([place])   . Hyperlipidemia     Past Surgical History:  Procedure Laterality Date  . FEMORAL-FEMORAL BYPASS GRAFT Bilateral 2007    There were no vitals filed for this visit.  Subjective Assessment - 10/07/17 1113    Subjective  "No I didn't have homework" ST found HW undone    Patient is accompained by:  Family member    Currently in Pain?  No/denies            ADULT SLP TREATMENT - 10/07/17 1117      General Information   Behavior/Cognition  Alert;Cooperative      Treatment Provided   Treatment provided  Cognitive-Linquistic      Cognitive-Linquistic Treatment   Treatment focused on  Cognition    Skilled Treatment  Targeted organization and alternating attention between plot (garden) and detailed instructions for planting with extended time, frequent mod A to attend to and processes instructions details. Visual cues also provided. Processing of written detailed instructions remains slow and requires repetition, verbal cues. Pt solved time/functional math (simple) with  extended time and rare min A.       Assessment / Recommendations / Plan   Plan  Continue with current plan of care      Progression Toward Goals   Progression toward goals  Not progressing toward goals (comment) due to awareness,          SLP Short Term Goals - 10/07/17 1206      SLP SHORT TERM GOAL #1   Title  Pt will selectively attend to mildly complex cognitive linguistic task with distractions and occasional min redirection    Baseline  09/02/17; 09/04/17, 09/15/17    Time  1    Period  Weeks    Status  Achieved      SLP SHORT TERM GOAL #2   Title  Pt will complete mildy complex organization, reasoning, problem sovling tasks with 85% accuracy and occasional min A    Time  1    Period  Weeks    Status  Not Met      SLP SHORT TERM GOAL #3   Title  Pt will ID errors and self correct 3/5 errors with occasional min A    Time  1    Period  Weeks    Status  Not Met      SLP SHORT TERM GOAL #4   Title  Pt will  utilize external aids for medication, schedule management with occasional min A from family outside of therapy over 2 sessions    Baseline  09/23/17    Time  1    Period  Weeks    Status  Partially Met       SLP Long Term Goals - 10/07/17 1206      SLP LONG TERM GOAL #1   Title  Pt will alternate attention between 2 mildly complex cognitive linguistic tasksk with 85% on each and occasional min A    Time  2    Period  Weeks    Status  On-going      SLP LONG TERM GOAL #2   Title  Pt will self correct errors 4/5 errors with occasional min A    Time  2    Period  Weeks    Status  On-going      SLP LONG TERM GOAL #3   Title  Pt will recall details from paragraph, attending to the left, with 80% accuracy and occasional min A    Time  2    Period  Weeks    Status  On-going       Plan - 10/07/17 1203    Clinical Impression Statement  Mr. Oguinn continues to present with reduced processing and attention to detailed instructions. He continues to require  questioning cues and verbal cues to alternate attention Overall processing quite slow affecting simple functional problem solving and safety. Continue skilled ST to maximize cognition for safety and to reduce caregiver burden.    Speech Therapy Frequency  2x / week    Treatment/Interventions  Environmental controls;Cueing hierarchy;SLP instruction and feedback;Cognitive reorganization;Functional tasks;Compensatory strategies;Internal/external aids;Patient/family education    Potential to Achieve Goals  Good    Potential Considerations  Ability to learn/carryover information;Severity of impairments    Consulted and Agree with Plan of Care  Patient;Family member/caregiver    Family Member Consulted  spouse       Patient will benefit from skilled therapeutic intervention in order to improve the following deficits and impairments:   Cognitive communication deficit    Problem List Patient Active Problem List   Diagnosis Date Noted  . Acute ischemic right MCA stroke ([place]) 07/26/2017  . Hyperlipidemia 07/23/2017  . Diastolic dysfunction   . Acute blood loss anemia   . Essential hypertension 07/22/2017  . Type 2 diabetes mellitus with renal manifestations ([place]) 07/22/2017  . Acute CVA (cerebrovascular accident) ([place]) 07/21/2017  . Chronic kidney disease, stage III (moderate) ([place]) 02/19/2013  . Atherosclerotic peripheral vascular disease ([place]) 02/19/2013  . Pure hypercholesterolemia 02/19/2013  . Diabetes mellitus, stable ([place]) 02/05/2013    Lovvorn, Annye Rusk MS, CCC-SLP 10/07/2017, 12:09 PM  [facility] [address] [place], [place], [address] Phone: [phone]   Fax:  [phone]   Name: FONTAINE HEHL MRN: [id number] Date of Birth: 11-08-1940

## 2017-10-07 NOTE — Therapy (Signed)
[facility] [address] [place], [place], [address] Phone: [phone]   Fax:  [phone]  Physical Therapy Treatment  Patient Details  Name: Eric Long MRN: [id number] Date of Birth: 12-07-1940 Referring Provider: Dr. Letta Pate   Encounter Date: 10/07/2017  PT End of Session - 10/07/17 1313    Visit Number  13    Number of Visits  17    Date for PT Re-Evaluation  10/11/17    Authorization Type  [employer]    PT Start Time  1231    PT Stop Time  1309    PT Time Calculation (min)  38 min    Equipment Utilized During Treatment  Gait belt    Activity Tolerance  Patient tolerated treatment well    Behavior During Therapy  [facility] for tasks assessed/performed       Past Medical History:  Diagnosis Date  . Diabetes mellitus without complication ([place])   . Hyperlipidemia     Past Surgical History:  Procedure Laterality Date  . FEMORAL-FEMORAL BYPASS GRAFT Bilateral 2007    There were no vitals filed for this visit.  Subjective Assessment - 10/07/17 1237    Subjective  Pt denied falls or changes since last visit. Pt verbalized understanding of s/s and risk factors of CVA.    Patient is accompained by:  Family member wife: [name]    Pertinent History  DM, HLD, PVD, CKD (stage III)    Patient Stated Goals  Return to IND living, walk without concern, build up stamina     Currently in Pain?  No/denies                       [facility] Adult PT Treatment/Exercise - 10/07/17 1239      Ambulation/Gait   Ambulation/Gait  Yes    Ambulation/Gait Assistance  5: Supervision;4: Min guard    Ambulation/Gait Assistance Details  Cues for turning with RW in order to sit down. Cues to stay inside RW and to decr. L foot ER. Pt also required cues to not veer torwards L side (grassy area) when on sidewalk outside. Pt reported BUEs and BLEs fatigued after amb. but no pain or SOB.     Ambulation Distance (Feet)  615  Feet 20    Assistive device  Rolling walker    Gait Pattern  Step-through pattern;Decreased stride length;Decreased dorsiflexion - left;Decreased weight shift to left;Decreased trunk rotation    Ambulation Surface  Level;Unlevel;Indoor;Outdoor;Paved    Gait velocity  1.39f/sec. with RW first bout, 2.177fsec. with RW during second bout, 1.7120fec. without AD        Standardized Balance Assessment   Standardized Balance Assessment  Timed Up and Go Test      Timed Up and Go Test   TUG  Normal TUG    Normal TUG (seconds)  18.54 without AD and 30.31 with RW           Self Care:  PT Education - 10/07/17 1312    Education provided  Yes    Education Details  PT educated pt on goal progress and outcome measures and to use RW at all times at home. PT will slowly start working on gait without AD as appropriate. Pt was able to verbalize understanding of CVA risk factors and s/s.     Person(s) Educated  Patient;Spouse    Methods  Explanation    Comprehension  Verbalized understanding       PT  Short Term Goals - 09/23/17 1544      PT SHORT TERM GOAL #1   Title  Pt will perform HEP with S to improve balance, endurance, strength, and safety. TARGET DATE FOR ALL STGS: 09/09/17    Status  Achieved      PT SHORT TERM GOAL #2   Title  Pt will perform TUG with LRAD, with S,  in </=25 sec. to decr. falls risk    Baseline  36.37 secs with RW, 19.65 secs without device min/guard A for safety.     Status  Achieved      PT SHORT TERM GOAL #3   Title  Pt will improve gait speed with LRAD to >/=1.70f/sec. to decr. falls risk.     Baseline  2.04 ft/sec with RW, 1.66 ft/sec without device    Status  Achieved      PT SHORT TERM GOAL #4   Title  Perform BERG and write STG and LTG as indicated.     Status  Achieved      PT SHORT TERM GOAL #5   Title  Pt will amb. 300' over even terrain with LRAD and S to improve safety during functional mobility.     Baseline  not met in clinic on 09/22/17,  min/guard with max cues for walker negotiation.     Status  Not Met      PT SHORT TERM GOAL #6   Title  Pt will improve BERG score to >/=35/56 to decr. falls risk.     Baseline  37/56 on 09/23/17    Status  Partially Met        PT Long Term Goals - 10/07/17 1314      PT LONG TERM GOAL #1   Title  Pt will verbalize understanding of CVA risk factors and s/s to decr. risk of CVA. (TARGET DATE FOR ALL LTGS: 10/07/17)    Status  Achieved      PT LONG TERM GOAL #2   Title  Pt will be IND with HEP to improve strength, safety, balance, and endurance.     Status  New      PT LONG TERM GOAL #3   Title  Pt will perform TUG with LRAD, at MOD I level, in </=13.5 sec. to decr. falls risk.     Status  Partially Met      PT LONG TERM GOAL #4   Title  Pt will improve gait speed to >/=2.620fsec. with LRAD to amb. safely in the community.     Status  Partially Met      PT LONG TERM GOAL #5   Title  Pt will amb. 600' over uneven terrain with LRAD at MOD I level to improve functional mobility and safety.     Status  Partially Met      PT LONG TERM GOAL #6   Title  Pt will improve BERG score to >/=39/56 to decr. falls risk.     Status  New            Plan - 10/07/17 1313    Clinical Impression Statement  Pt demonstrated progress, as he met LTG 1, pt partially met LTGs 3, 4, and 5. PT will assess LTGs 2 and 6 next session. Pt's gait speed indicates pt is unable to amb. safely in the community with an AD and is at a falls risk without RW. Pt's TUG time indicates pt is at falls risk. Seated rest breaks required today 2/2  fatigue. Continue with POC.     Rehab Potential  Good    Clinical Impairments Affecting Rehab Potential  see above.     PT Frequency  2x / week    PT Duration  8 weeks    PT Treatment/Interventions  ADLs/Self Care Home Management;Biofeedback;Canalith Repostioning;Electrical Stimulation;Therapeutic exercise;Therapeutic activities;Manual techniques;Wheelchair mobility  training;Vestibular;Functional mobility training;Orthotic Fit/Training;Stair training;Gait training;Patient/family education;Cognitive remediation;DME Instruction;Neuromuscular re-education;Balance training    PT Next Visit Plan  continue gait with RW,esp turns.  Gait without device unless outdoors. Incorporate martial arts (weight shifting). Finish Assessing LTGs and renew.     Consulted and Agree with Plan of Care  Patient;Family member/caregiver    Family Member Consulted  pt's wife ([name]?), [name], [name]: dtr       Patient will benefit from skilled therapeutic intervention in order to improve the following deficits and impairments:  Abnormal gait, Decreased endurance, Impaired sensation, Postural dysfunction, Impaired flexibility, Impaired tone, Decreased coordination, Decreased safety awareness, Decreased cognition, Decreased range of motion, Dizziness, Decreased balance, Decreased mobility, Decreased activity tolerance, Decreased knowledge of use of DME, Decreased strength, Impaired UE functional use  Visit Diagnosis: Hemiplegia and hemiparesis following cerebral infarction affecting left non-dominant side (HCC)  Other lack of coordination  Unsteadiness on feet     Problem List Patient Active Problem List   Diagnosis Date Noted  . Acute ischemic right MCA stroke ([place]) 07/26/2017  . Hyperlipidemia 07/23/2017  . Diastolic dysfunction   . Acute blood loss anemia   . Essential hypertension 07/22/2017  . Type 2 diabetes mellitus with renal manifestations ([place]) 07/22/2017  . Acute CVA (cerebrovascular accident) ([place]) 07/21/2017  . Chronic kidney disease, stage III (moderate) ([place]) 02/19/2013  . Atherosclerotic peripheral vascular disease ([place]) 02/19/2013  . Pure hypercholesterolemia 02/19/2013  . Diabetes mellitus, stable ([place]) 02/05/2013    Phyllistine Domingos L 10/07/2017, 1:16 PM  [facility] [address] [place], [place], [address] Phone: [phone]   Fax:  [phone]  Name: MARKEL KURTENBACH MRN: [id number] Date of Birth: 12-14-1940  Geoffry Paradise, PT,DPT 10/07/17 1:16 PM Phone: [phone] Fax: [phone]

## 2017-10-07 NOTE — Therapy (Signed)
[facility] [address] [place], [place], [address] Phone: [phone]   Fax:  [phone]  Occupational Therapy Treatment  Patient Details  Name: Eric Long MRN: [id number] Date of Birth: [birth year] Referring Provider: Dr. Letta Pate   Encounter Date: 10/07/2017  OT End of Session - 10/07/17 1258    Visit Number  12    Number of Visits  16    Date for OT Re-Evaluation  10/07/17    Authorization Type  medicare    Authorization Time Period  90 days    OT Start Time  1146    OT Stop Time  1228    OT Time Calculation (min)  42 min    Activity Tolerance  Patient tolerated treatment well       Past Medical History:  Diagnosis Date  . Diabetes mellitus without complication ([place])   . Hyperlipidemia     Past Surgical History:  Procedure Laterality Date  . FEMORAL-FEMORAL BYPASS GRAFT Bilateral 2007    There were no vitals filed for this visit.  Subjective Assessment - 10/07/17 1152    Subjective   I think my reading is better    Patient is accompained by:  Family member wife    Pertinent History  R MCA CVA, acute encephalopathy, severe L carotid stenosis.      Patient Stated Goals  back to where I am stable with walking, use the computer, continue reading and writing.     Currently in Pain?  No/denies                   OT Treatments/Exercises (OP) - 10/07/17 0001      Visual/Perceptual Exercises   Other Exercises  Addressed reading at short paragraph level using reading guides. Pt has significantly improved with the mechanics of reading (visual tracking, word recognition, finding next line down) and in general is able to describe the larger concept of what he has read. Pt continues to miss the details of what he is reading even with questioning.  Pt with poor insight into this however insight did slightly improve after reading several short paragraphs.  Discussed with wife how to incorporate the  reading pt is already doing at home daily (family to question about details of what pt is reading). Wife verbalized understanding after demonstration.  Also addressed environmental scanning - pt with 90% accuracy with simple activity. Pt and wife report that pt usually sees and finds things on the left in his own home but frequently misses things and runs into things in the community. Pt and wife to use simple shopping activity for pt to begin to work on enivironmental scanning functionally.                 OT Short Term Goals - 10/07/17 1256      OT SHORT TERM GOAL #1   Title  Pt and family will be mod I for home activities program for L field cut/L neglect - 09/09/2017    Status  Achieved      OT SHORT TERM GOAL #2   Title  Assessed 9 hole peg and determine goal if appropriate.    Status  Achieved see PN      OT SHORT TERM GOAL #3   Title  Assess Box and blocks and determine goal if appropriate    Status  Achieved L= 38      OT SHORT TERM GOAL #4   Title  Pt will  be supevision for UB dressing     Status  Achieved      OT SHORT TERM GOAL #5   Title  Pt will need no more than 3 vc's for clothing orientation when dressing.    Status  Achieved      OT SHORT TERM GOAL #6   Title  Pt will be min a, mod cues for LB dressing    Status  Achieved      OT SHORT TERM GOAL #7   Title  Pt will locate items during functional tasks on L with no more than min cues     Status  Achieved        OT Long Term Goals - 10/07/17 1256      OT LONG TERM GOAL #1   Title  Pt and family will be mod I upgraded HEP prn - 10/07/2017    Status  On-going      OT LONG TERM GOAL #2   Title  Pt will demonstrate envrionmental scanning during functional task with at least 90% accuracy    Status  On-going      OT LONG TERM GOAL #3   Title  Pt will be able to complete bilateral tasks with no more than min cues to attend to L hand    Status  Achieved      OT LONG TERM GOAL #4   Title  Pt will be mod I  with eating    Status  Achieved      OT LONG TERM GOAL #5   Title  Pt will be supervision for simple yard tasks.    Status  Deferred at pts request      OT LONG TERM GOAL #6   Title  Pt will be supervision with dressing    Status  Revised      OT LONG TERM GOAL #7   Title  Pt will be mod I with toilet and shower transfers    Status  On-going      OT LONG TERM GOAL #8   Title  Pt will be able to read simple paragraph with compensations and min cueing    Status  On-going            Plan - 10/07/17 1257    Clinical Impression Statement  Pt with slow but steady progress toward goals.  Scanning has improved during functional tasks.     Occupational Profile and client history currently impacting functional performance  PMH:  DM, HTN, HLD, chronic kidney disease, PVD, severe L extracranial carotid stenosis.      Occupational performance deficits (Please refer to evaluation for details):  ADL's;IADL's;Rest and Sleep;Leisure;Social Participation    Rehab Potential  Good    Current Impairments/barriers affecting progress:  signficant perceptual deficits, apraxia, cognitive deficits    OT Frequency  2x / week    OT Duration  8 weeks    OT Treatment/Interventions  Self-care/ADL training;Aquatic Therapy;Electrical Stimulation;DME and/or AE instruction;Neuromuscular education;Therapeutic exercise;Functional Mobility Training;Manual Therapy;Therapeutic activities;Cognitive remediation/compensation;Balance training;Patient/family education;Visual/perceptual remediation/compensation    Plan  NMR for  motor planning with  familiar functional tasks, environmental scanning, reading, NMR for LUE functional use and mobility, LB dressing    Consulted and Agree with Plan of Care  Patient;Family member/caregiver    Family Member Consulted  wife        Patient will benefit from skilled therapeutic intervention in order to improve the following deficits and impairments:  Abnormal gait, Decreased  activity tolerance,  Decreased balance, Decreased coordination, Decreased cognition, Decreased knowledge of use of DME, Decreased mobility, Decreased safety awareness, Difficulty walking, Impaired UE functional use, Impaired sensation, Impaired vision/preception  Visit Diagnosis: Hemiplegia and hemiparesis following cerebral infarction affecting left non-dominant side (HCC)  Other lack of coordination  Unsteadiness on feet  Left-sided neglect  Visuospatial deficit  Other symptoms and signs involving cognitive functions following cerebral infarction  Other disturbances of skin sensation  Apraxia  Other symptoms and signs involving the nervous system    Problem List Patient Active Problem List   Diagnosis Date Noted  . Acute ischemic right MCA stroke ([place]) 07/26/2017  . Hyperlipidemia 07/23/2017  . Diastolic dysfunction   . Acute blood loss anemia   . Essential hypertension 07/22/2017  . Type 2 diabetes mellitus with renal manifestations ([place]) 07/22/2017  . Acute CVA (cerebrovascular accident) ([place]) 07/21/2017  . Chronic kidney disease, stage III (moderate) ([place]) 02/19/2013  . Atherosclerotic peripheral vascular disease ([place]) 02/19/2013  . Pure hypercholesterolemia 02/19/2013  . Diabetes mellitus, stable ([place]) 02/05/2013    Quay Burow, OTR/L 10/07/2017, 1:00 PM  [facility] [address] [place], [place], [address] Phone: [phone]   Fax:  [phone]  Name: Eric Long MRN: [id number] Date of Birth: 1940-09-25

## 2017-10-08 DIAGNOSIS — I639 Cerebral infarction, unspecified: Secondary | ICD-10-CM | POA: Diagnosis not present

## 2017-10-09 ENCOUNTER — Ambulatory Visit: Payer: Medicare HMO | Admitting: Occupational Therapy

## 2017-10-09 ENCOUNTER — Ambulatory Visit: Payer: Medicare HMO

## 2017-10-09 ENCOUNTER — Ambulatory Visit: Payer: Medicare HMO | Admitting: Speech Pathology

## 2017-10-09 ENCOUNTER — Encounter: Payer: Self-pay | Admitting: Speech Pathology

## 2017-10-09 ENCOUNTER — Encounter: Payer: Self-pay | Admitting: Occupational Therapy

## 2017-10-09 DIAGNOSIS — I69354 Hemiplegia and hemiparesis following cerebral infarction affecting left non-dominant side: Secondary | ICD-10-CM

## 2017-10-09 DIAGNOSIS — R41841 Cognitive communication deficit: Secondary | ICD-10-CM | POA: Diagnosis not present

## 2017-10-09 DIAGNOSIS — R29818 Other symptoms and signs involving the nervous system: Secondary | ICD-10-CM | POA: Diagnosis not present

## 2017-10-09 DIAGNOSIS — I69318 Other symptoms and signs involving cognitive functions following cerebral infarction: Secondary | ICD-10-CM

## 2017-10-09 DIAGNOSIS — R2681 Unsteadiness on feet: Secondary | ICD-10-CM

## 2017-10-09 DIAGNOSIS — R414 Neurologic neglect syndrome: Secondary | ICD-10-CM | POA: Diagnosis not present

## 2017-10-09 DIAGNOSIS — R278 Other lack of coordination: Secondary | ICD-10-CM | POA: Diagnosis not present

## 2017-10-09 DIAGNOSIS — R482 Apraxia: Secondary | ICD-10-CM | POA: Diagnosis not present

## 2017-10-09 DIAGNOSIS — R2689 Other abnormalities of gait and mobility: Secondary | ICD-10-CM

## 2017-10-09 DIAGNOSIS — R41842 Visuospatial deficit: Secondary | ICD-10-CM | POA: Diagnosis not present

## 2017-10-09 DIAGNOSIS — R208 Other disturbances of skin sensation: Secondary | ICD-10-CM

## 2017-10-09 NOTE — Therapy (Signed)
[facility] [address] [place], [place], [address] Phone: [phone]   Fax:  [phone]  Occupational Therapy Treatment  Patient Details  Name: Eric Long MRN: [id number] Date of Birth: May 12, 1940 Referring Provider: Dr. Letta Pate   Encounter Date: 10/09/2017  OT End of Session - 10/09/17 1738    Visit Number  13    Number of Visits  29    Date for OT Re-Evaluation  10/07/17    Authorization Type  medicare    Authorization Time Period  90 days    OT Start Time  1447    OT Stop Time  1529    OT Time Calculation (min)  42 min    Activity Tolerance  Patient tolerated treatment well       Past Medical History:  Diagnosis Date  . Diabetes mellitus without complication ([place])   . Hyperlipidemia     Past Surgical History:  Procedure Laterality Date  . FEMORAL-FEMORAL BYPASS GRAFT Bilateral 2007    There were no vitals filed for this visit.  Subjective Assessment - 10/09/17 1449    Subjective   I miss things sometimes on the left    Patient is accompained by:  Family member wife    Pertinent History  R MCA CVA, acute encephalopathy, severe L carotid stenosis.      Patient Stated Goals  back to where I am stable with walking, use the computer, continue reading and writing.     Currently in Pain?  No/denies                   OT Treatments/Exercises (OP) - 10/09/17 0001      Neurological Re-education Exercises   Other Exercises 1  Neuro re ed to address dynamic standing balance while incoporating visual scanning task - pt with 80% accuracy today with more complex scannng task. Pt required intermittent contact guard to occassional min a depending on challenge of activity to balance.  Pt remains very slow to process information and often can verbalize what he is doing however has difficulty executing task.  Wife reports that with verbal cueing to orient pants pt can don pants however needs more  practice.                 OT Short Term Goals - 10/09/17 1734      OT SHORT TERM GOAL #1   Title  Pt and family will be mod I for home activities program for L field cut/L neglect - 09/09/2017    Status  Achieved      OT SHORT TERM GOAL #2   Title  Assessed 9 hole peg and determine goal if appropriate.    Status  Achieved see PN      OT SHORT TERM GOAL #3   Title  Assess Box and blocks and determine goal if appropriate    Status  Achieved L= 38      OT SHORT TERM GOAL #4   Title  Pt will be supevision for UB dressing     Status  Achieved      OT SHORT TERM GOAL #5   Title  Pt will need no more than 3 vc's for clothing orientation when dressing.    Status  Achieved      OT SHORT TERM GOAL #6   Title  Pt will be min a, mod cues for LB dressing    Status  Achieved      OT  SHORT TERM GOAL #7   Title  Pt will locate items during functional tasks on L with no more than min cues     Status  Achieved      OT SHORT TERM GOAL #8   Title  Pt will be supervision for simple bev and snack prep - 11/06/2017    Status  New        OT Long Term Goals - 10/09/17 1734      OT LONG TERM GOAL #1   Title  Pt and family will be mod I upgraded HEP prn - 12/04/2017    Status  On-going      OT LONG TERM GOAL #2   Title  Pt will demonstrate envrionmental scanning during functional task with at least 90% accuracy    Status  On-going      OT LONG TERM GOAL #3   Title  Pt will be able to complete bilateral tasks with no more than min cues to attend to L hand    Status  Achieved      OT LONG TERM GOAL #4   Title  Pt will be mod I with eating    Status  Achieved      OT LONG TERM GOAL #5   Title  Pt will be supervision for simple yard tasks.    Status  Deferred at pts request      OT LONG TERM GOAL #6   Title  Pt will be supervision with dressing    Status  Revised      OT LONG TERM GOAL #7   Title  Pt will be mod I with toilet and shower transfers    Status  On-going      OT  LONG TERM GOAL #8   Title  Pt will be able to read simple paragraph with compensations and min cueing    Status  On-going            Plan - 10/09/17 1736    Clinical Impression Statement  Pt with very slow progress toward goals. Pt will require time, repetition and practice for continued progress.     Occupational Profile and client history currently impacting functional performance  PMH:  DM, HTN, HLD, chronic kidney disease, PVD, severe L extracranial carotid stenosis.      Occupational performance deficits (Please refer to evaluation for details):  ADL's;IADL's;Rest and Sleep;Leisure;Social Participation    Rehab Potential  Fair    Current Impairments/barriers affecting progress:  signficant perceptual deficits, apraxia, cognitive deficits    OT Frequency  2x / week    OT Duration  8 weeks    OT Treatment/Interventions  Self-care/ADL training;Aquatic Therapy;Electrical Stimulation;DME and/or AE instruction;Neuromuscular education;Therapeutic exercise;Functional Mobility Training;Manual Therapy;Therapeutic activities;Cognitive remediation/compensation;Balance training;Patient/family education;Visual/perceptual remediation/compensation    Plan  NMR for  motor planning with  familiar functional tasks, environmental scanning, reading, NMR for LUE functional use, balance and and functional mobility, LB dressing    Consulted and Agree with Plan of Care  Patient;Family member/caregiver    Family Member Consulted  wife        Patient will benefit from skilled therapeutic intervention in order to improve the following deficits and impairments:  Abnormal gait, Decreased activity tolerance, Decreased balance, Decreased coordination, Decreased cognition, Decreased knowledge of use of DME, Decreased mobility, Decreased safety awareness, Difficulty walking, Impaired UE functional use, Impaired sensation, Impaired vision/preception  Visit Diagnosis: Hemiplegia and hemiparesis following cerebral  infarction affecting left non-dominant side ([place]) - Plan: Ot  plan of care cert/re-cert  Unsteadiness on feet - Plan: Ot plan of care cert/re-cert  Other lack of coordination - Plan: Ot plan of care cert/re-cert  Left-sided neglect - Plan: Ot plan of care cert/re-cert  Visuospatial deficit - Plan: Ot plan of care cert/re-cert  Other symptoms and signs involving cognitive functions following cerebral infarction - Plan: Ot plan of care cert/re-cert  Other disturbances of skin sensation - Plan: Ot plan of care cert/re-cert  Apraxia - Plan: Ot plan of care cert/re-cert  Other symptoms and signs involving the nervous system - Plan: Ot plan of care cert/re-cert    Problem List Patient Active Problem List   Diagnosis Date Noted  . Acute ischemic right MCA stroke ([place]) 07/26/2017  . Hyperlipidemia 07/23/2017  . Diastolic dysfunction   . Acute blood loss anemia   . Essential hypertension 07/22/2017  . Type 2 diabetes mellitus with renal manifestations ([place]) 07/22/2017  . Acute CVA (cerebrovascular accident) ([place]) 07/21/2017  . Chronic kidney disease, stage III (moderate) ([place]) 02/19/2013  . Atherosclerotic peripheral vascular disease ([place]) 02/19/2013  . Pure hypercholesterolemia 02/19/2013  . Diabetes mellitus, stable ([place]) 02/05/2013    Quay Burow, OTR/L 10/09/2017, 5:40 PM  [facility] [address] [place], [place], [address] Phone: [phone]   Fax:  [phone]  Name: MIHRAN LEBARRON MRN: [id number] Date of Birth: June 17, 1940

## 2017-10-09 NOTE — Therapy (Signed)
[facility] [address] [place], [place], [address] Phone: [phone]   Fax:  [phone]  Speech Language Pathology Treatment  Patient Details  Name: Eric Long MRN: [id number] Date of Birth: 1941-03-08 Referring Provider: Dr. Alysia Long   Encounter Date: 10/09/2017  End of Session - 10/09/17 1512    Visit Number  13    Number of Visits  62    SLP Start Time  1316    SLP Stop Time   1401    SLP Time Calculation (min)  45 min    Activity Tolerance  Patient tolerated treatment well       Past Medical History:  Diagnosis Date  . Diabetes mellitus without complication ([place])   . Hyperlipidemia     Past Surgical History:  Procedure Laterality Date  . FEMORAL-FEMORAL BYPASS GRAFT Bilateral 2007    There were no vitals filed for this visit.  Subjective Assessment - 10/09/17 1329    Subjective  "This was the hardest one" HW re: making change    Patient is accompained by:  Family member    Currently in Pain?  No/denies            ADULT SLP TREATMENT - 10/09/17 1335      General Information   Behavior/Cognition  Alert;Cooperative    Patient Positioning  Upright in chair      Treatment Provided   Treatment provided  Cognitive-Linquistic      Cognitive-Linquistic Treatment   Treatment focused on  Cognition    Skilled Treatment  Alteranating attention between written naming task and money counting task with occasional min verba and visual cues to return to written task.       Assessment / Recommendations / Plan   Plan  Continue with current plan of care      Progression Toward Goals   Progression toward goals  Not progressing toward goals (comment) reduced awareness         SLP Short Term Goals - 10/09/17 1512      SLP SHORT TERM GOAL #1   Title  Pt will selectively attend to mildly complex cognitive linguistic task with distractions and occasional min redirection    Baseline   09/02/17; 09/04/17, 09/15/17    Time  1    Period  Weeks    Status  Achieved      SLP SHORT TERM GOAL #2   Title  Pt will complete mildy complex organization, reasoning, problem sovling tasks with 85% accuracy and occasional min A    Time  1    Period  Weeks    Status  Not Met      SLP SHORT TERM GOAL #3   Title  Pt will ID errors and self correct 3/5 errors with occasional min A    Time  1    Period  Weeks    Status  Not Met      SLP SHORT TERM GOAL #4   Title  Pt will utilize external aids for medication, schedule management with occasional min A from family outside of therapy over 2 sessions    Baseline  09/23/17    Time  1    Period  Weeks    Status  Partially Met       SLP Long Term Goals - 10/09/17 1512      SLP LONG TERM GOAL #1   Title  Pt will alternate attention between 2 mildly complex cognitive linguistic tasksk  with 85% on each and occasional min A    Time  2    Period  Weeks    Status  On-going      SLP LONG TERM GOAL #2   Title  Pt will self correct errors 4/5 errors with occasional min A    Time  2    Period  Weeks    Status  On-going      SLP LONG TERM GOAL #3   Title  Pt will recall details from paragraph, attending to the left, with 80% accuracy and occasional min A    Time  2    Period  Weeks    Status  On-going       Plan - 10/09/17 1512    Clinical Impression Statement  Mr. Eric Long continues to present with reduced processing and attention to detailed instructions. He continues to require questioning cues and verbal cues to alternate attention Overall processing quite slow affecting simple functional problem solving and safety. Continue skilled ST to maximize cognition for safety and to reduce caregiver burden.    Speech Therapy Frequency  2x / week    Treatment/Interventions  Environmental controls;Cueing hierarchy;SLP instruction and feedback;Cognitive reorganization;Functional tasks;Compensatory strategies;Internal/external aids;Patient/family  education    Potential Considerations  Ability to learn/carryover information;Severity of impairments       Patient will benefit from skilled therapeutic intervention in order to improve the following deficits and impairments:   Cognitive communication deficit    Problem List Patient Active Problem List   Diagnosis Date Noted  . Acute ischemic right MCA stroke ([place]) 07/26/2017  . Hyperlipidemia 07/23/2017  . Diastolic dysfunction   . Acute blood loss anemia   . Essential hypertension 07/22/2017  . Type 2 diabetes mellitus with renal manifestations ([place]) 07/22/2017  . Acute CVA (cerebrovascular accident) ([place]) 07/21/2017  . Chronic kidney disease, stage III (moderate) ([place]) 02/19/2013  . Atherosclerotic peripheral vascular disease ([place]) 02/19/2013  . Pure hypercholesterolemia 02/19/2013  . Diabetes mellitus, stable ([place]) 02/05/2013    Eric Long, Eric Rusk MS, [place] 10/09/2017, 3:13 PM  [facility] [address] [place], [place], [address] Phone: [phone]   Fax:  [phone]   Name: Eric Long MRN: [id number] Date of Birth: 06/24/1940

## 2017-10-09 NOTE — Patient Instructions (Addendum)
Piriformis Stretch, Sitting    Sit, one ankle on opposite knee, same-side hand on crossed knee. Push down on knee, keeping spine straight. Lean torso forward, with flat back, until tension is felt in hamstrings and gluteals of crossed-leg side. Hold _30__ seconds.  Repeat _3__ times per session. Do _1-2__ sessions per day.  Copyright  VHI. All rights reserved.   HIP: Hamstrings - Short Sitting    Rest leg on raised surface. Keep knee straight. Lift chest and bend forward at the hips. Keep looking straight ahead. Hold _30__ seconds. __3_ reps per set, __1-2_ sets per day, __7_ days per week.  Copyright  VHI. All rights reserved.    Lower Trunk Rotation Stretch    Keeping back flat and feet together, rotate knees to left side. Hold _30___ seconds. Repeat to the other side.  Repeat __3__ times per set. Do __1__ sets per session. Do _1-2___ sessions per day.  http://orth.exer.us/122   Bridge    Lie back, legs bent.Tuck in stomach, lift hips up towards ceiling. Hold for 2 seconds, then slowly lower back down. Repeat __10__ times.Do 3 sets per session.Do __3__ sessions perweek.  http://pm.exer.us/54  Copyright  VHI. All rights reserved  Sit to Stand: Head Upright    With head upright, stand up slowly with eyes open.Keep left foot straight (family can sit beside you and help your foot maintain position). Try to stand without using hands. Lean forward and sit slowly.  Repeat _10___ times per session. Do _2___ sessions per day.  Copyright  VHI. All rights reserved.

## 2017-10-09 NOTE — Therapy (Signed)
[facility] [address] [place], [place], [address] Phone: [phone]   Fax:  [phone]  Physical Therapy Treatment  Patient Details  Name: Eric Long MRN: [id number] Date of Birth: 02-05-1941 Referring Provider: Dr. Letta Pate   Encounter Date: 10/09/2017  PT End of Session - 10/09/17 1444    Visit Number  14    Number of Visits  17    Date for PT Re-Evaluation  10/11/17 now requesting additional 2x/week for 8 weeks (11/10/17)    Authorization Type  [employer]    PT Start Time  1405    PT Stop Time  1444    PT Time Calculation (min)  39 min    Equipment Utilized During Treatment  Gait belt    Activity Tolerance  Patient tolerated treatment well    Behavior During Therapy  [facility] for tasks assessed/performed       Past Medical History:  Diagnosis Date  . Diabetes mellitus without complication ([place])   . Hyperlipidemia     Past Surgical History:  Procedure Laterality Date  . FEMORAL-FEMORAL BYPASS GRAFT Bilateral 2007    There were no vitals filed for this visit.  Subjective Assessment - 10/09/17 1408    Subjective  Pt denied falls or changes since last visit.     Patient is accompained by:  Family member wife: Eric Long    Pertinent History  DM, HLD, PVD, CKD (stage III)    Patient Stated Goals  Return to IND living, walk without concern, build up stamina     Currently in Pain?  No/denies            Therex: Please see pt instructions for HEP details. PT provided demo and cues for technique. No c/o pain.           [facility] Adult PT Treatment/Exercise - 10/09/17 1409      Standardized Balance Assessment   Standardized Balance Assessment  Berg Balance Test      Berg Balance Test   Sit to Stand  Able to stand without using hands and stabilize independently    Standing Unsupported  Able to stand safely 2 minutes    Sitting with Back Unsupported but Feet Supported on Floor or Stool  Able to  sit safely and securely 2 minutes    Stand to Sit  Sits safely with minimal use of hands    Transfers  Able to transfer safely, minor use of hands    Standing Unsupported with Eyes Closed  Able to stand 10 seconds with supervision    Standing Ubsupported with Feet Together  Able to place feet together independently and stand for 1 minute with supervision    From Standing, Reach Forward with Outstretched Arm  Can reach confidently >25 cm (10") with LUE    From Standing Position, Pick up Object from Floor  Able to pick up shoe safely and easily    From Standing Position, Turn to Look Behind Over each Shoulder  Looks behind one side only/other side shows less weight shift    Turn 360 Degrees  Able to turn 360 degrees safely but slowly    Standing Unsupported, Alternately Place Feet on Step/Stool  Able to complete 4 steps without aid or supervision    Standing Unsupported, One Foot in Front  Able to take small step independently and hold 30 seconds    Standing on One Leg  Tries to lift leg/unable to hold 3 seconds but remains standing independently  Total Score  44             PT Education - 10/09/17 1444    Education provided  Yes    Education Details  PT discussed goal progress and BERG score. PT discussed renewal process and pt agreeable.     Person(s) Educated  Patient;Spouse    Methods  Explanation;Demonstration;Tactile cues;Verbal cues;Handout    Comprehension  Verbalized understanding;Returned demonstration;Need further instruction       PT Short Term Goals - 09/23/17 1544      PT SHORT TERM GOAL #1   Title  Pt will perform HEP with S to improve balance, endurance, strength, and safety. TARGET DATE FOR ALL STGS: 09/09/17    Status  Achieved      PT SHORT TERM GOAL #2   Title  Pt will perform TUG with LRAD, with S,  in </=25 sec. to decr. falls risk    Baseline  36.37 secs with RW, 19.65 secs without device min/guard A for safety.     Status  Achieved      PT SHORT TERM  GOAL #3   Title  Pt will improve gait speed with LRAD to >/=1.61f/sec. to decr. falls risk.     Baseline  2.04 ft/sec with RW, 1.66 ft/sec without device    Status  Achieved      PT SHORT TERM GOAL #4   Title  Perform BERG and write STG and LTG as indicated.     Status  Achieved      PT SHORT TERM GOAL #5   Title  Pt will amb. 300' over even terrain with LRAD and S to improve safety during functional mobility.     Baseline  not met in clinic on 09/22/17, min/guard with max cues for walker negotiation.     Status  Not Met      PT SHORT TERM GOAL #6   Title  Pt will improve BERG score to >/=35/56 to decr. falls risk.     Baseline  37/56 on 09/23/17    Status  Partially Met        PT Long Term Goals - 10/09/17 1446      PT LONG TERM GOAL #1   Title  Pt will verbalize understanding of CVA risk factors and s/s to decr. risk of CVA. (TARGET DATE FOR ALL LTGS: 10/07/17) all unmet goals will be carried over to new POC: 11/06/17    Status  Achieved      PT LONG TERM GOAL #2   Title  Pt will be IND with HEP to improve strength, safety, balance, and endurance.     Status  Partially Met      PT LONG TERM GOAL #3   Title  Pt will perform TUG with LRAD, at MOD I level, in </=13.5 sec. to decr. falls risk.     Status  Partially Met      PT LONG TERM GOAL #4   Title  Pt will improve gait speed to >/=2.679fsec. with LRAD to amb. safely in the community.     Status  Partially Met      PT LONG TERM GOAL #5   Title  Pt will amb. 600' over uneven terrain with LRAD at MOD I level to improve functional mobility and safety.     Status  Partially Met      PT LONG TERM GOAL #6   Title  Pt will improve BERG score to >/=49/56 to decr. falls risk.  Baseline  44/56 on 10/09/17    Status  Revised            Plan - 10/09/17 1445    Clinical Impression Statement  Pt demonstrated progress, as he met LTG 6 and partially met LTG 2. Pt's BERG score indicates pt is at a significant risk for falls. Pt  would continue to benefit from skilled PT to improve safety during functional mobility. PT requesting add'l 2x/week for 4 weeks.    Rehab Potential  Good    Clinical Impairments Affecting Rehab Potential  see above.     PT Frequency  2x / week    PT Duration  4 weeks    PT Treatment/Interventions  ADLs/Self Care Home Management;Biofeedback;Canalith Repostioning;Electrical Stimulation;Therapeutic exercise;Therapeutic activities;Manual techniques;Wheelchair mobility training;Vestibular;Functional mobility training;Orthotic Fit/Training;Stair training;Gait training;Patient/family education;Cognitive remediation;DME Instruction;Neuromuscular re-education;Balance training    PT Next Visit Plan  Finish assessing balance and standing strengthening HEP. continue gait with RW,esp turns.  Gait without device unless outdoors. Incorporate martial arts (weight shifting).     Consulted and Agree with Plan of Care  Patient;Family member/caregiver    Family Member Consulted  pt's wife (Eric Long?), Eric Long, [name]: dtr       Patient will benefit from skilled therapeutic intervention in order to improve the following deficits and impairments:  Abnormal gait, Decreased endurance, Impaired sensation, Postural dysfunction, Impaired flexibility, Impaired tone, Decreased coordination, Decreased safety awareness, Decreased cognition, Decreased range of motion, Dizziness, Decreased balance, Decreased mobility, Decreased activity tolerance, Decreased knowledge of use of DME, Decreased strength, Impaired UE functional use  Visit Diagnosis: Hemiplegia and hemiparesis following cerebral infarction affecting left non-dominant side (HCC) - Plan: PT plan of care cert/re-cert  Unsteadiness on feet - Plan: PT plan of care cert/re-cert  Other lack of coordination - Plan: PT plan of care cert/re-cert  Other abnormalities of gait and mobility - Plan: PT plan of care cert/re-cert     Problem List Patient Active Problem List    Diagnosis Date Noted  . Acute ischemic right MCA stroke ([place]) 07/26/2017  . Hyperlipidemia 07/23/2017  . Diastolic dysfunction   . Acute blood loss anemia   . Essential hypertension 07/22/2017  . Type 2 diabetes mellitus with renal manifestations ([place]) 07/22/2017  . Acute CVA (cerebrovascular accident) ([place]) 07/21/2017  . Chronic kidney disease, stage III (moderate) ([place]) 02/19/2013  . Atherosclerotic peripheral vascular disease ([place]) 02/19/2013  . Pure hypercholesterolemia 02/19/2013  . Diabetes mellitus, stable ([place]) 02/05/2013    Ginia Rudell L 10/09/2017, 2:50 PM  [facility] [address] [place], [place], [address] Phone: [phone]   Fax:  [phone]  Name: Eric Long MRN: [id number] Date of Birth: January 08, 1941  Geoffry Paradise, PT,DPT 10/09/17 2:51 PM Phone: [phone] Fax: [phone]

## 2017-10-14 ENCOUNTER — Ambulatory Visit: Payer: Medicare HMO | Admitting: Occupational Therapy

## 2017-10-14 ENCOUNTER — Ambulatory Visit: Payer: Medicare HMO | Admitting: Speech Pathology

## 2017-10-14 ENCOUNTER — Encounter: Payer: Self-pay | Admitting: Speech Pathology

## 2017-10-14 ENCOUNTER — Encounter: Payer: Self-pay | Admitting: Occupational Therapy

## 2017-10-14 ENCOUNTER — Ambulatory Visit: Payer: Medicare HMO

## 2017-10-14 DIAGNOSIS — I69318 Other symptoms and signs involving cognitive functions following cerebral infarction: Secondary | ICD-10-CM

## 2017-10-14 DIAGNOSIS — R29818 Other symptoms and signs involving the nervous system: Secondary | ICD-10-CM | POA: Diagnosis not present

## 2017-10-14 DIAGNOSIS — I69354 Hemiplegia and hemiparesis following cerebral infarction affecting left non-dominant side: Secondary | ICD-10-CM

## 2017-10-14 DIAGNOSIS — R41842 Visuospatial deficit: Secondary | ICD-10-CM | POA: Diagnosis not present

## 2017-10-14 DIAGNOSIS — R414 Neurologic neglect syndrome: Secondary | ICD-10-CM | POA: Diagnosis not present

## 2017-10-14 DIAGNOSIS — R2681 Unsteadiness on feet: Secondary | ICD-10-CM | POA: Diagnosis not present

## 2017-10-14 DIAGNOSIS — R278 Other lack of coordination: Secondary | ICD-10-CM | POA: Diagnosis not present

## 2017-10-14 DIAGNOSIS — R482 Apraxia: Secondary | ICD-10-CM

## 2017-10-14 DIAGNOSIS — R41841 Cognitive communication deficit: Secondary | ICD-10-CM

## 2017-10-14 DIAGNOSIS — R208 Other disturbances of skin sensation: Secondary | ICD-10-CM | POA: Diagnosis not present

## 2017-10-14 DIAGNOSIS — R2689 Other abnormalities of gait and mobility: Secondary | ICD-10-CM

## 2017-10-14 NOTE — Therapy (Signed)
[facility] [address] [place], [place], [address] Phone: [phone]   Fax:  [phone]  Speech Language Pathology Treatment  Patient Details  Name: Eric Long MRN: [id number] Date of Birth: 16-May-1940 Referring Provider: Dr. Alysia Penna   Encounter Date: 10/14/2017  End of Session - 10/14/17 1529    Visit Number  14    Number of Visits  17    Date for SLP Re-Evaluation  10/31/17    Authorization Type  60 total visits - if PT/OT/ST are on 1 day, it counts as 1 visit    Authorization Time Period   I extended re-eval date as pt was evald 4/9, but did not initiate ST until 4/25, 2 weeks later    SLP Start Time  1232    SLP Stop Time   1315    SLP Time Calculation (min)  43 min    Activity Tolerance  Patient tolerated treatment well       Past Medical History:  Diagnosis Date  . Diabetes mellitus without complication ([place])   . Hyperlipidemia     Past Surgical History:  Procedure Laterality Date  . FEMORAL-FEMORAL BYPASS GRAFT Bilateral 2007    There were no vitals filed for this visit.  Subjective Assessment - 10/14/17 1241    Subjective  "I'm always looking throught this binder"    Patient is accompained by:  Family member spouse    Currently in Pain?  No/denies            ADULT SLP TREATMENT - 10/14/17 1257      General Information   Behavior/Cognition  Alert;Cooperative      Treatment Provided   Treatment provided  Cognitive-Linquistic      Cognitive-Linquistic Treatment   Treatment focused on  Cognition    Skilled Treatment  Attention to errors  with frequent mod A. Alternating attention iwth extended time, non verbal cues to return to task., Added turn taking to alternating attention task wihich required occasional min verbal  cues for taking his turn.       Assessment / Recommendations / Plan   Plan  Continue with current plan of care      Progression Toward Goals   Progression toward goals  Not progressing toward goals (comment) error awareness, awareness         SLP Short Term Goals - 10/14/17 1529      SLP SHORT TERM GOAL #1   Title  Pt will selectively attend to mildly complex cognitive linguistic task with distractions and occasional min redirection    Baseline  09/02/17; 09/04/17, 09/15/17    Time  1    Period  Weeks    Status  Achieved      SLP SHORT TERM GOAL #2   Title  Pt will complete mildy complex organization, reasoning, problem sovling tasks with 85% accuracy and occasional min A    Time  1    Period  Weeks    Status  Not Met      SLP SHORT TERM GOAL #3   Title  Pt will ID errors and self correct 3/5 errors with occasional min A    Time  1    Period  Weeks    Status  Not Met      SLP SHORT TERM GOAL #4   Title  Pt will utilize external aids for medication, schedule management with occasional min A from family outside of therapy over 2 sessions  Baseline  09/23/17    Time  1    Period  Weeks    Status  Partially Met       SLP Long Term Goals - 10/14/17 1529      SLP LONG TERM GOAL #1   Title  Pt will alternate attention between 2 mildly complex cognitive linguistic tasksk with 85% on each and occasional min A    Time  2    Period  Weeks    Status  On-going      SLP LONG TERM GOAL #2   Title  Pt will self correct errors 4/5 errors with occasional min A    Time  2    Period  Weeks    Status  On-going      SLP LONG TERM GOAL #3   Title  Pt will recall details from paragraph, attending to the left, with 80% accuracy and occasional min A    Time  2    Period  Weeks    Status  On-going       Plan - 10/14/17 1527    Clinical Impression Statement  Eric Long continues to present with reduced processing and attention to detailed instructions. He continues to require questioning cues and verbal cues to alternate attention Overall processing quite slow affecting simple functional problem solving and safety. I have asked  spouse on several occasions what they are having difficulty with at home in respects to cognition, spouse has not identified any specific or repated difficulties. Continue skilled ST to maximize cognition for safety and to reduce caregiver burden.    Speech Therapy Frequency  2x / week    Treatment/Interventions  Environmental controls;Cueing hierarchy;SLP instruction and feedback;Cognitive reorganization;Functional tasks;Compensatory strategies;Internal/external aids;Patient/family education    Potential Considerations  Ability to learn/carryover information;Severity of impairments       Patient will benefit from skilled therapeutic intervention in order to improve the following deficits and impairments:   Cognitive communication deficit    Problem List Patient Active Problem List   Diagnosis Date Noted  . Acute ischemic right MCA stroke ([place]) 07/26/2017  . Hyperlipidemia 07/23/2017  . Diastolic dysfunction   . Acute blood loss anemia   . Essential hypertension 07/22/2017  . Type 2 diabetes mellitus with renal manifestations ([place]) 07/22/2017  . Acute CVA (cerebrovascular accident) ([place]) 07/21/2017  . Chronic kidney disease, stage III (moderate) ([place]) 02/19/2013  . Atherosclerotic peripheral vascular disease ([place]) 02/19/2013  . Pure hypercholesterolemia 02/19/2013  . Diabetes mellitus, stable ([place]) 02/05/2013    Lovvorn, Annye Rusk MS, CCC-SLP 10/14/2017, 3:33 PM  [facility] [address] [place], [place], [address] Phone: [phone]   Fax:  [phone]   Name: Eric Long MRN: [id number] Date of Birth: 1940/07/05

## 2017-10-14 NOTE — Therapy (Signed)
[facility] [address] [place], [place], [address] Phone: [phone]   Fax:  [phone]  Occupational Therapy Treatment  Patient Details  Name: Eric Long MRN: [id number] Date of Birth: 1941-02-03 Referring Provider: Dr. Letta Pate   Encounter Date: 10/14/2017  OT End of Session - 10/14/17 1606    Visit Number  14    Number of Visits  29    Date for OT Re-Evaluation  10/07/17    Authorization Type  medicare    Authorization Time Period  90 days    OT Start Time  1316    OT Stop Time  1359    OT Time Calculation (min)  43 min    Activity Tolerance  Patient tolerated treatment well       Past Medical History:  Diagnosis Date  . Diabetes mellitus without complication ([facility])   . Hyperlipidemia     Past Surgical History:  Procedure Laterality Date  . FEMORAL-FEMORAL BYPASS GRAFT Bilateral 2007    There were no vitals filed for this visit.  Subjective Assessment - 10/14/17 1322    Subjective   I think I did fair with that cooking thing    Patient is accompained by:  Family member wife    Pertinent History  R MCA CVA, acute encephalopathy, severe L carotid stenosis.      Patient Stated Goals  back to where I am stable with walking, use the computer, continue reading and writing.     Currently in Pain?  No/denies                   OT Treatments/Exercises (OP) - 10/14/17 0001      ADLs   Cooking  Utilized cooking activity and clean up activity to address LUE functional use, bilateral UE functional use, environmental scanning, L neglect, dynamic standing balance, safety, basic sequening/organization/problem solving.  Pt requires very close supervision due to safety issues around the stove (on two occassions reached across the hot burner with L hand to do functional task and was unaware of how close his hand as to the burner.  Pt also needed cues for L neglect/hemianopsia when putting butter in  the pan, as well as when washing dishes (pt never noticed L sink in two sided sink).  Pt able to state steps however extremely delayed ability to initiate and execute tasks.  Pt had some difficulty determing how to orient dishes in dish drain but was able to complete with extra time.  Educated pt's wife and pt on how impairments impacted functional tasks - pt and wife both able to verbalize that pt will need extremely close supervision in [place].                OT Short Term Goals - 10/14/17 1603      OT SHORT TERM GOAL #1   Title  Pt and family will be mod I for home activities program for L field cut/L neglect - 09/09/2017    Status  Achieved      OT SHORT TERM GOAL #2   Title  Assessed 9 hole peg and determine goal if appropriate.    Status  Achieved see PN      OT SHORT TERM GOAL #3   Title  Assess Box and blocks and determine goal if appropriate    Status  Achieved L= 38      OT SHORT TERM GOAL #4   Title  Pt will be supevision  for UB dressing     Status  Achieved      OT SHORT TERM GOAL #5   Title  Pt will need no more than 3 vc's for clothing orientation when dressing.    Status  Achieved      OT SHORT TERM GOAL #6   Title  Pt will be min a, mod cues for LB dressing    Status  Achieved      OT SHORT TERM GOAL #7   Title  Pt will locate items during functional tasks on L with no more than min cues     Status  On-going      OT SHORT TERM GOAL #8   Title  Pt will be supervision for simple bev and snack prep - 11/06/2017    Status  New        OT Long Term Goals - 10/14/17 1603      OT LONG TERM GOAL #1   Title  Pt and family will be mod I upgraded HEP prn - 12/04/2017    Status  On-going      OT LONG TERM GOAL #2   Title  Pt will demonstrate envrionmental scanning during functional task with at least 90% accuracy    Status  On-going      OT LONG TERM GOAL #3   Title  Pt will be able to complete bilateral tasks with no more than min cues to attend to L hand     Status  Achieved      OT LONG TERM GOAL #4   Title  Pt will be mod I with eating    Status  Achieved      OT LONG TERM GOAL #5   Title  Pt will be supervision for simple yard tasks.    Status  Deferred at pts request      OT LONG TERM GOAL #6   Title  Pt will be supervision with dressing    Status  Revised      OT LONG TERM GOAL #7   Title  Pt will be mod I with toilet and shower transfers    Status  On-going      OT LONG TERM GOAL #8   Title  Pt will be able to read simple paragraph with compensations and min cueing    Status  On-going            Plan - 10/14/17 1604    Clinical Impression Statement  Pt demonstrating signficant impairments with more complex but familiar task today.  Continue to educate pt and wife;  encouraged wife to have pt participate in simple tasks at home such as setting table, washing dishes, clearing table, folding towels, etc to allow pt to work on skills.      Occupational Profile and client history currently impacting functional performance  PMH:  DM, HTN, HLD, chronic kidney disease, PVD, severe L extracranial carotid stenosis.      Occupational performance deficits (Please refer to evaluation for details):  ADL's;IADL's;Rest and Sleep;Leisure;Social Participation    Rehab Potential  Fair    Current Impairments/barriers affecting progress:  signficant perceptual deficits, apraxia, cognitive deficits    OT Frequency  2x / week    OT Duration  8 weeks    OT Treatment/Interventions  Self-care/ADL training;Aquatic Therapy;Electrical Stimulation;DME and/or AE instruction;Neuromuscular education;Therapeutic exercise;Functional Mobility Training;Manual Therapy;Therapeutic activities;Cognitive remediation/compensation;Balance training;Patient/family education;Visual/perceptual remediation/compensation    Plan  NMR for  motor planning with  familiar functional  tasks, environmental scanning, reading, NMR for LUE functional use, balance and and functional  mobility, LB dressing    Consulted and Agree with Plan of Care  Patient;Family member/caregiver    Family Member Consulted  wife        Patient will benefit from skilled therapeutic intervention in order to improve the following deficits and impairments:  Abnormal gait, Decreased activity tolerance, Decreased balance, Decreased coordination, Decreased cognition, Decreased knowledge of use of DME, Decreased mobility, Decreased safety awareness, Difficulty walking, Impaired UE functional use, Impaired sensation, Impaired vision/preception  Visit Diagnosis: Hemiplegia and hemiparesis following cerebral infarction affecting left non-dominant side (HCC)  Unsteadiness on feet  Other lack of coordination  Left-sided neglect  Visuospatial deficit  Other symptoms and signs involving cognitive functions following cerebral infarction  Other disturbances of skin sensation  Apraxia  Other symptoms and signs involving the nervous system    Problem List Patient Active Problem List   Diagnosis Date Noted  . Acute ischemic right MCA stroke ([facility]) 07/26/2017  . Hyperlipidemia 07/23/2017  . Diastolic dysfunction   . Acute blood loss anemia   . Essential hypertension 07/22/2017  . Type 2 diabetes mellitus with renal manifestations ([facility]) 07/22/2017  . Acute CVA (cerebrovascular accident) ([facility]) 07/21/2017  . Chronic kidney disease, stage III (moderate) ([facility]) 02/19/2013  . Atherosclerotic peripheral vascular disease ([place] [facility]) 02/19/2013  . Pure hypercholesterolemia 02/19/2013  . Diabetes mellitus, stable ([facility]) 02/05/2013    Quay Burow , OTR/L 10/14/2017, 4:07 PM  [facility] [address] [place], [place], [address] Phone: [phone]   Fax:  [phone]  Name: JARRAH BABICH MRN: [id number] Date of Birth: 12/06/1940

## 2017-10-14 NOTE — Patient Instructions (Addendum)
Perform in corner with chair in front of you OR at kitchen sink with chair behind you for safety: perform with someone there with you.  Feet Apart, Head Motion - Eyes Open    Hold chair as needed.With eyes open, feet apart, move head slowly: up and down5 times and side to side 5 times. Repeat __3__ times per session. Do __1__ sessions per day.  Copyright  VHI. All rights reserved. Feet Apart, Varied Arm Positions - Eyes Closed    Stand with feet shoulder width apart and armsat your side or holding chair as needed. Close eyes and visualize upright position. Hold __30__ seconds. Repeat __3__ times per session. Do __1__ sessions per day.  Copyright  VHI. All rights reserved. Feet Together, Varied Arm Positions - Eyes Open    With eyes open, feet together, armsat your side and holding counter as needed, look straight ahead at a stationary object. Hold __30__ seconds. Repeat __3__ times per session. Do __1__ sessions per day.  Copyright  VHI. All rights reserved. Single Leg - Eyes Open    Holding support, lift right leg while maintaining balance over other leg. Progress to removing hands from support surface for longer periods of time. Hold_10-30___ seconds. Repeat with other leg, might need to block left leg to ensure safety. Repeat __3__ times per sessionper leg. Do __1__ sessions per day.       Copyright  VHI. All rights reserved.  Knee High    Do this forward along the counter top at home and hold counter with one hand. Have [name] beside you to help. March slowly forward, turn when you get to the end and march to the other end. Do 4 laps down and back.  Repeat __1__ times. Do __1-2__ sessions per day.  http://gt2.exer.us/767  Copyright  VHI. All rights reserved.  HIP: Abduction - Standing    Squeeze glutes. Raise leg out and slightly back.Stay tall and keep feet pointed forward towards counter. Stay tall.  __10_ reps per set, _1-2__  sets per day, _5-7__ days per week Hold onto a support.  Copyright  VHI. All rights reserved.

## 2017-10-14 NOTE — Therapy (Signed)
[facility] [address] [place], [place], [address] Phone: [phone]   Fax:  [phone]  Physical Therapy Treatment  Patient Details  Name: Eric Long MRN: [id number] Date of Birth: [birth year] Referring Provider: Dr. Letta Pate   Encounter Date: 10/14/2017  PT End of Session - 10/14/17 1526    Visit Number  15    Number of Visits  17    Date for PT Re-Evaluation  11/10/17    Authorization Type  [employer]    PT Start Time  8413    PT Stop Time  1443    PT Time Calculation (min)  39 min    Equipment Utilized During Treatment  -- min guard to S prn    Activity Tolerance  Patient tolerated treatment well    Behavior During Therapy  [facility] for tasks assessed/performed       Past Medical History:  Diagnosis Date  . Diabetes mellitus without complication ([facility])   . Hyperlipidemia     Past Surgical History:  Procedure Laterality Date  . FEMORAL-FEMORAL BYPASS GRAFT Bilateral 2007    There were no vitals filed for this visit.  Subjective Assessment - 10/14/17 1410    Subjective  Pt denied falls or changes since last visit.     Patient is accompained by:  Family member    Pertinent History  DM, HLD, PVD, CKD (stage III)    Patient Stated Goals  Return to IND living, walk without concern, build up stamina     Currently in Pain?  No/denies          Therex and Neuro re-ed: Pt performed in standing with cues and demo for technique. Min guard to S for safety and B UE support prn. Please see pt instructions for details.              [facility] Adult PT Treatment/Exercise - 10/14/17 1429      Ambulation/Gait   Ambulation/Gait  Yes    Ambulation/Gait Assistance  4: Min guard;5: Supervision    Ambulation/Gait Assistance Details  Cues to improve upright posture, heel strike, keeping L foot inside RW outdoors. Cues for sequencing over grassy terrain with B hands on PT's shoulders for safety and support.      Ambulation Distance (Feet)  400 Feet indoor and outdoor    Assistive device  Rolling walker    Gait Pattern  Step-through pattern;Decreased stride length;Decreased dorsiflexion - left;Decreased weight shift to left;Decreased trunk rotation    Ambulation Surface  Level;Unlevel;Indoor;Outdoor;Paved;Grass             PT Education - 10/14/17 1524    Education provided  Yes    Education Details  PT reviewed HEP and provided cues and needed. PT discussed the importance of always utilizing the RW vs. quad cane for safety. Pt's wife stated she left pt's cane at our clinic last visit, we checked the AD closet and she found the one she believes is his.     Person(s) Educated  Patient;Spouse    Methods  Explanation;Demonstration;Tactile cues;Verbal cues    Comprehension  Returned demonstration;Verbalized understanding;Need further instruction       PT Short Term Goals - 09/23/17 1544      PT SHORT TERM GOAL #1   Title  Pt will perform HEP with S to improve balance, endurance, strength, and safety. TARGET DATE FOR ALL STGS: 09/09/17    Status  Achieved      PT SHORT TERM GOAL #2  Title  Pt will perform TUG with LRAD, with S,  in </=25 sec. to decr. falls risk    Baseline  36.37 secs with RW, 19.65 secs without device min/guard A for safety.     Status  Achieved      PT SHORT TERM GOAL #3   Title  Pt will improve gait speed with LRAD to >/=1.107f/sec. to decr. falls risk.     Baseline  2.04 ft/sec with RW, 1.66 ft/sec without device    Status  Achieved      PT SHORT TERM GOAL #4   Title  Perform BERG and write STG and LTG as indicated.     Status  Achieved      PT SHORT TERM GOAL #5   Title  Pt will amb. 300' over even terrain with LRAD and S to improve safety during functional mobility.     Baseline  not met in clinic on 09/22/17, min/guard with max cues for walker negotiation.     Status  Not Met      PT SHORT TERM GOAL #6   Title  Pt will improve BERG score to >/=35/56 to  decr. falls risk.     Baseline  37/56 on 09/23/17    Status  Partially Met        PT Long Term Goals - 10/09/17 1446      PT LONG TERM GOAL #1   Title  Pt will verbalize understanding of CVA risk factors and s/s to decr. risk of CVA. (TARGET DATE FOR ALL LTGS: 10/07/17) all unmet goals will be carried over to new POC: 11/06/17    Status  Achieved      PT LONG TERM GOAL #2   Title  Pt will be IND with HEP to improve strength, safety, balance, and endurance.     Status  Partially Met      PT LONG TERM GOAL #3   Title  Pt will perform TUG with LRAD, at MOD I level, in </=13.5 sec. to decr. falls risk.     Status  Partially Met      PT LONG TERM GOAL #4   Title  Pt will improve gait speed to >/=2.618fsec. with LRAD to amb. safely in the community.     Status  Partially Met      PT LONG TERM GOAL #5   Title  Pt will amb. 600' over uneven terrain with LRAD at MOD I level to improve functional mobility and safety.     Status  Partially Met      PT LONG TERM GOAL #6   Title  Pt will improve BERG score to >/=49/56 to decr. falls risk.     Baseline  44/56 on 10/09/17    Status  Revised            Plan - 10/14/17 1535    Clinical Impression Statement  Pt required cues during standing balance and strengthening activities 2/2 cognition. Pt able to improve technique with demo and tactile cues. Pt demonstrated progress as he was able to amb. outdoors over paved surfaces with RW, with less cues over inclines/declines and then amb. with B hand support on PT's shoulders over grassy terrain with cues for technique. Continue with POC.     Rehab Potential  Good    Clinical Impairments Affecting Rehab Potential  see above.     PT Frequency  2x / week    PT Duration  4 weeks  PT Treatment/Interventions  ADLs/Self Care Home Management;Biofeedback;Canalith Repostioning;Electrical Stimulation;Therapeutic exercise;Therapeutic activities;Manual techniques;Wheelchair mobility  training;Vestibular;Functional mobility training;Orthotic Fit/Training;Stair training;Gait training;Patient/family education;Cognitive remediation;DME Instruction;Neuromuscular re-education;Balance training    PT Next Visit Plan  continue gait with RW,esp turns.  Gait without device unless outdoors. Incorporate martial arts (weight shifting).     Consulted and Agree with Plan of Care  Patient;Family member/caregiver    Family Member Consulted  pt's wife ([name]?), [name], [name]: dtr       Patient will benefit from skilled therapeutic intervention in order to improve the following deficits and impairments:  Abnormal gait, Decreased endurance, Impaired sensation, Postural dysfunction, Impaired flexibility, Impaired tone, Decreased coordination, Decreased safety awareness, Decreased cognition, Decreased range of motion, Dizziness, Decreased balance, Decreased mobility, Decreased activity tolerance, Decreased knowledge of use of DME, Decreased strength, Impaired UE functional use  Visit Diagnosis: Hemiplegia and hemiparesis following cerebral infarction affecting left non-dominant side (HCC)  Unsteadiness on feet  Other abnormalities of gait and mobility     Problem List Patient Active Problem List   Diagnosis Date Noted  . Acute ischemic right MCA stroke ([facility]) 07/26/2017  . Hyperlipidemia 07/23/2017  . Diastolic dysfunction   . Acute blood loss anemia   . Essential hypertension 07/22/2017  . Type 2 diabetes mellitus with renal manifestations ([facility]) 07/22/2017  . Acute CVA (cerebrovascular accident) ([facility]) 07/21/2017  . Chronic kidney disease, stage III (moderate) ([facility]) 02/19/2013  . Atherosclerotic peripheral vascular disease ([facility]) 02/19/2013  . Pure hypercholesterolemia 02/19/2013  . Diabetes mellitus, stable ([facility]) 02/05/2013    Blannie Shedlock L 10/14/2017, 3:39 PM  [facility] [address] [place], [place],  [address] Phone: [phone]   Fax:  [phone]  Name: Eric Long MRN: [id number] Date of Birth: [birth year]  Geoffry Paradise, PT,DPT 10/14/17 3:39 PM Phone: [phone] Fax: [phone]  Geoffry Paradise, PT,DPT 10/14/17 3:40 PM Phone: [phone] Fax: [phone]

## 2017-10-16 ENCOUNTER — Ambulatory Visit: Payer: Medicare HMO | Admitting: Occupational Therapy

## 2017-10-16 ENCOUNTER — Ambulatory Visit: Payer: Medicare HMO | Admitting: Speech Pathology

## 2017-10-16 ENCOUNTER — Encounter: Payer: Self-pay | Admitting: Occupational Therapy

## 2017-10-16 ENCOUNTER — Ambulatory Visit: Payer: Medicare HMO

## 2017-10-16 DIAGNOSIS — R41841 Cognitive communication deficit: Secondary | ICD-10-CM

## 2017-10-16 DIAGNOSIS — R208 Other disturbances of skin sensation: Secondary | ICD-10-CM | POA: Diagnosis not present

## 2017-10-16 DIAGNOSIS — R414 Neurologic neglect syndrome: Secondary | ICD-10-CM

## 2017-10-16 DIAGNOSIS — R482 Apraxia: Secondary | ICD-10-CM | POA: Diagnosis not present

## 2017-10-16 DIAGNOSIS — R41842 Visuospatial deficit: Secondary | ICD-10-CM

## 2017-10-16 DIAGNOSIS — I69354 Hemiplegia and hemiparesis following cerebral infarction affecting left non-dominant side: Secondary | ICD-10-CM

## 2017-10-16 DIAGNOSIS — I69318 Other symptoms and signs involving cognitive functions following cerebral infarction: Secondary | ICD-10-CM | POA: Diagnosis not present

## 2017-10-16 DIAGNOSIS — R29818 Other symptoms and signs involving the nervous system: Secondary | ICD-10-CM | POA: Diagnosis not present

## 2017-10-16 DIAGNOSIS — R278 Other lack of coordination: Secondary | ICD-10-CM | POA: Diagnosis not present

## 2017-10-16 DIAGNOSIS — R2681 Unsteadiness on feet: Secondary | ICD-10-CM

## 2017-10-16 NOTE — Therapy (Signed)
**Note Eric-Identified via Obfuscation** [facility] [address] [place], [place], [address] Phone: [phone]   Fax:  [phone]  Physical Therapy Treatment  Patient Details  Name: [name] MRN: [id number] Date of Birth: 20-Jan-1941 Referring Provider: Dr. Letta Pate   Encounter Date: 10/16/2017  PT End of Session - 10/16/17 1148    Visit Number  16    Number of Visits  17    Date for PT Re-Evaluation  11/10/17    Authorization Type  [employer]    PT Start Time  1103    PT Stop Time  1142    PT Time Calculation (min)  39 min    Equipment Utilized During Treatment  -- min guard to S prn    Activity Tolerance  Patient tolerated treatment well;No increased pain    Behavior During Therapy  WFL for tasks assessed/performed       Past Medical History:  Diagnosis Date  . Diabetes mellitus without complication ([place])   . Hyperlipidemia     Past Surgical History:  Procedure Laterality Date  . FEMORAL-FEMORAL BYPASS GRAFT Bilateral 2007    There were no vitals filed for this visit.  Subjective Assessment - 10/16/17 1108    Subjective  Pt denied falls or changes since last visit.     Patient is accompained by:  Family member    Pertinent History  DM, HLD, PVD, CKD (stage III)    Patient Stated Goals  Return to IND living, walk without concern, build up stamina     Currently in Pain?  Yes    Pain Score  2     Pain Location  Leg    Pain Orientation  Left    Pain Descriptors / Indicators  Cramping    Pain Type  Acute pain    Pain Onset  In the past 7 days    Pain Frequency  Intermittent    Aggravating Factors   Walking     Pain Relieving Factors  stop and rest                       [facility] Adult PT Treatment/Exercise - 10/16/17 1144      High Level Balance   High Level Balance Activities  Braiding;Backward walking    High Level Balance Comments  Performed 4-8 reps per activity with pt progressing from 2 hands on // bars to  no hand support during backwards amb. but with min guard provided by PT. Cues to improve L lateral weight shifting and R step length. PT provided demo, tactile and verbal cues for technique and positioning. Pt progressed to no cues during braiding with BUE support on // bars. Pt performed weight shifting (martial art positions) x10 reps with intermittent UE support and PT  min guard to S.  Incr. Time for initiation, processing, and to ensure understanding.      Exercises   Exercises  Knee/Hip      Knee/Hip Exercises: Stretches   Passive Hamstring Stretch  3 reps;30 seconds with sheet.     Passive Hamstring Stretch Limitations  Please see pt instrucitons for HEP details. Cues and demo for technique. Performed in seated and standing position. Pt reported no LE pain after stretches.              PT Education - 10/16/17 1147    Education provided  Yes    Education Details  PT discussed the importance of performing HEP with S at  home, as pt's son told OT pt is not performing activities at home. PT provided pt with hamstring stretch. PT provided pt with Tai Chi information to trial at PT d/c.     Person(s) Educated  Patient;Child(ren)    Methods  Explanation;Demonstration;Tactile cues;Verbal cues;Handout    Comprehension  Returned demonstration;Verbalized understanding;Need further instruction       PT Short Term Goals - 09/23/17 1544      PT SHORT TERM GOAL #1   Title  Pt will perform HEP with S to improve balance, endurance, strength, and safety. TARGET DATE FOR ALL STGS: 09/09/17    Status  Achieved      PT SHORT TERM GOAL #2   Title  Pt will perform TUG with LRAD, with S,  in </=25 sec. to decr. falls risk    Baseline  36.37 secs with RW, 19.65 secs without device min/guard A for safety.     Status  Achieved      PT SHORT TERM GOAL #3   Title  Pt will improve gait speed with LRAD to >/=1.11f/sec. to decr. falls risk.     Baseline  2.04 ft/sec with RW, 1.66 ft/sec without device     Status  Achieved      PT SHORT TERM GOAL #4   Title  Perform BERG and write STG and LTG as indicated.     Status  Achieved      PT SHORT TERM GOAL #5   Title  Pt will amb. 300' over even terrain with LRAD and S to improve safety during functional mobility.     Baseline  not met in clinic on 09/22/17, min/guard with max cues for walker negotiation.     Status  Not Met      PT SHORT TERM GOAL #6   Title  Pt will improve BERG score to >/=35/56 to decr. falls risk.     Baseline  37/56 on 09/23/17    Status  Partially Met        PT Long Term Goals - 10/09/17 1446      PT Long TERM GOAL #1   Title  Pt will verbalize understanding of CVA risk factors and s/s to decr. risk of CVA. (TARGET DATE FOR ALL LTGS: 10/07/17) all unmet goals will be carried over to new POC: 11/06/17    Status  Achieved      PT Long TERM GOAL #2   Title  Pt will be IND with HEP to improve strength, safety, balance, and endurance.     Status  Partially Met      PT Long TERM GOAL #3   Title  Pt will perform TUG with LRAD, at MOD I level, in </=13.5 sec. to decr. falls risk.     Status  Partially Met      PT Long TERM GOAL #4   Title  Pt will improve gait speed to >/=2.667fsec. with LRAD to amb. safely in the community.     Status  Partially Met      PT Long TERM GOAL #5   Title  Pt will amb. 600' over uneven terrain with LRAD at MOD I level to improve functional mobility and safety.     Status  Partially Met      PT Long TERM GOAL #6   Title  Pt will improve BERG score to >/=49/56 to decr. falls risk.     Baseline  44/56 on 10/09/17    Status  Revised  Plan - 10/16/17 1148    Clinical Impression Statement  Skilled session focused on balance activities to improve weight shifting onto LLE and improve overall balance without UE support. Pt demonstrated progress, as he progressed to no cues during braiding. PT provided pt with hamstring stretch to decr. LLE pain. Continue with POC.     Rehab  Potential  Good    Clinical Impairments Affecting Rehab Potential  see above.     PT Frequency  2x / week    PT Duration  4 weeks    PT Treatment/Interventions  ADLs/Self Care Home Management;Biofeedback;Canalith Repostioning;Electrical Stimulation;Therapeutic exercise;Therapeutic activities;Manual techniques;Wheelchair mobility training;Vestibular;Functional mobility training;Orthotic Fit/Training;Stair training;Gait training;Patient/family education;Cognitive remediation;DME Instruction;Neuromuscular re-education;Balance training    PT Next Visit Plan  continue gait with RW,esp turns.  Gait without device unless outdoors. Incorporate martial arts (weight shifting).     Consulted and Agree with Plan of Care  Patient;Family member/caregiver    Family Member Consulted  pt's wife ([name]?), [name], [name]: dtr       Patient will benefit from skilled therapeutic intervention in order to improve the following deficits and impairments:  Abnormal gait, Decreased endurance, Impaired sensation, Postural dysfunction, Impaired flexibility, Impaired tone, Decreased coordination, Decreased safety awareness, Decreased cognition, Decreased range of motion, Dizziness, Decreased balance, Decreased mobility, Decreased activity tolerance, Decreased knowledge of use of DME, Decreased strength, Impaired UE functional use  Visit Diagnosis: Unsteadiness on feet  Hemiplegia and hemiparesis following cerebral infarction affecting left non-dominant side (HCC)  Other lack of coordination     Problem List Patient Active Problem List   Diagnosis Date Noted  . Acute ischemic right MCA stroke ([place]) 07/26/2017  . Hyperlipidemia 07/23/2017  . Diastolic dysfunction   . Acute blood loss anemia   . Essential hypertension 07/22/2017  . Type 2 diabetes mellitus with renal manifestations ([place]) 07/22/2017  . Acute CVA (cerebrovascular accident) ([place]) 07/21/2017  . Chronic kidney disease, stage III (moderate) ([place])  02/19/2013  . Atherosclerotic peripheral vascular disease ([place]) 02/19/2013  . Pure hypercholesterolemia 02/19/2013  . Diabetes mellitus, stable ([place]) 02/05/2013    Tamira Ryland L 10/16/2017, 11:50 AM  [facility] [address] [place], [place], [address] Phone: [phone]   Fax:  [phone]  Name: Eric Long MRN: [id number] Date of Birth: 04-Feb-1941  Geoffry Paradise, PT,DPT 10/16/17 11:51 AM Phone: [phone] Fax: [phone]

## 2017-10-16 NOTE — Patient Instructions (Signed)
Gastroc Stretch    Holding onto counter: Stand with LEFT  foot back, leg straight, forward leg bent. Keeping heel on floor, turned slightly out, lean into wall until stretch is felt in calf. Hold _30___ seconds. Repeat __3__ times per set. Do __1__ sets per session. Do _2-3___ sessions per day.  http://orth.exer.us/26   Copyright  VHI. All rights reserved.   Gastroc / Heel Cord Stretch - Seated With Towel    Seated in chair, with Left heel on floor, towel around ball of foot. Gently pull foot in toward body, stretching heel cord and calf. Hold for _30__ seconds. Repeat __3_ times. Do _2-3__ times per day.  Copyright  VHI. All rights reserved.

## 2017-10-16 NOTE — Patient Instructions (Signed)
  Consider medication for attention (ask Dr. Raliegh Ip) and for initiation  Daily cognitive activities would be beneficial for both parents   Return for ST in 3 to 6 months if Mr. Thain has improved attention and awareness of his cognitive impairments  If family is seeing specific areas/tasks that are difficult, keep a list and let OT know when he returns to OT

## 2017-10-16 NOTE — Therapy (Signed)
[facility] [address] [place], [place], [address] Phone: [phone]   Fax:  [phone]  Occupational Therapy Treatment  Patient Details  Name: Eric Long MRN: [id number] Date of Birth: 22-Sep-1940 Referring Provider: Dr. Letta Pate   Encounter Date: 10/16/2017  OT End of Session - 10/16/17 1612    Visit Number  15    Number of Visits  29    Date for OT Re-Evaluation  12/04/17    Authorization Type  medicare    Authorization Time Period  90 days    OT Start Time  1018    OT Stop Time  1100    OT Time Calculation (min)  42 min    Activity Tolerance  Patient tolerated treatment well       Past Medical History:  Diagnosis Date  . Diabetes mellitus without complication ([place])   . Hyperlipidemia     Past Surgical History:  Procedure Laterality Date  . FEMORAL-FEMORAL BYPASS GRAFT Bilateral 2007    There were no vitals filed for this visit.  Subjective Assessment - 10/16/17 1025    Subjective   I don't do things at home because I worry that I will hurt myself    Patient is accompained by:  Family member son    Pertinent History  R MCA CVA, acute encephalopathy, severe L carotid stenosis.      Patient Stated Goals  back to where I am stable with walking, use the computer, continue reading and writing.     Currently in Pain?  No/denies                   OT Treatments/Exercises (OP) - 10/16/17 0001      ADLs   ADL Comments  Son attended pt's session today with pt's permission.  Session focused on review of impairments, goal achievement, current deficits with focus on helping to make home environment more therapeutic for pt.  Reviewed all education provided to pt and pt's wife. Son reports that pt's wife continues to do a great deal for the patient and often tells him what to do and where items are vs. allowing pt to complete with supervision. Discussed with son that extensive education has been  done with pt and wife regarding hierarchy of cueing and using cues before helping physically. Pt's son verbalized understanding and stated he would spend some time with pt and wife in home environment to facilitate.  Pt also stated "I am afraid to do things at home because I am afraid I might get hurt."  Explained to pt that was why we were recommending supervision and that pt will need to engage in activity with supervision in order for pt to continue to make progress.  Pt and son both verbalized understanding.  Also discussed that pt has great difficulty with initiating and executing tasks (can verbally tell steps of task) and message sent with son for rehab MD for appt tomorrow to ask if stimulant might be helpful. Son to ensure that MD gets message and pt and son understand the physician is the one who make the decision about if this is appropriate.  Discussed use of structured schedule to assist with attention and initiation and written example of schedule provided. Son and pt to work at home on developing for pt.              OT Education - 10/16/17 1607    Education provided  Yes    Education  Details  impairments, carry over for home, use of structured schedule    Person(s) Educated  Patient;Child(ren)    Methods  Explanation;Handout    Comprehension  Verbalized understanding       OT Short Term Goals - 10/16/17 1609      OT SHORT TERM GOAL #1   Title  Pt and family will be mod I for home activities program for L field cut/L neglect - 09/09/2017    Status  Achieved      OT SHORT TERM GOAL #2   Title  Assessed 9 hole peg and determine goal if appropriate.    Status  Achieved see PN      OT SHORT TERM GOAL #3   Title  Assess Box and blocks and determine goal if appropriate    Status  Achieved L= 38      OT SHORT TERM GOAL #4   Title  Pt will be supevision for UB dressing     Status  Achieved      OT SHORT TERM GOAL #5   Title  Pt will need no more than 3 vc's for clothing  orientation when dressing.    Status  Achieved      OT SHORT TERM GOAL #6   Title  Pt will be min a, mod cues for LB dressing    Status  Achieved      OT SHORT TERM GOAL #7   Title  Pt will locate items during functional tasks on L with no more than min cues     Status  On-going      OT SHORT TERM GOAL #8   Title  Pt will be supervision for simple bev and snack prep - 11/06/2017    Status  On-going        OT Long Term Goals - 10/16/17 1609      OT LONG TERM GOAL #1   Title  Pt and family will be mod I upgraded HEP prn - 12/04/2017    Status  On-going      OT LONG TERM GOAL #2   Title  Pt will demonstrate envrionmental scanning during functional task with at least 90% accuracy    Status  Partially Met activity dependent and varies from day to day      OT LONG TERM GOAL #3   Title  Pt will be able to complete bilateral tasks with no more than min cues to attend to L hand    Status  Achieved      OT LONG TERM GOAL #4   Title  Pt will be mod I with eating    Status  Achieved      OT LONG TERM GOAL #5   Title  Pt will be supervision for simple yard tasks.    Status  Deferred at pts request      OT LONG TERM GOAL #6   Title  Pt will be supervision with dressing    Status  On-going      OT LONG TERM GOAL #7   Title  Pt will be mod I with toilet and shower transfers    Status  On-going      OT LONG TERM GOAL #8   Title  Pt will be able to read simple paragraph with compensations and min cueing    Status  Achieved            Plan - 10/16/17 1610    Clinical Impression Statement  Pt continues with slow progress toward goals. Pt to be placed on hold for 2 weeks to allow pt and wife to continue to work on basic self care and basic IADL tasks at home.  Pt and wife in agreement.     Occupational Profile and client history currently impacting functional performance  PMH:  DM, HTN, HLD, chronic kidney disease, PVD, severe L extracranial carotid stenosis.      Occupational  performance deficits (Please refer to evaluation for details):  ADL's;IADL's;Rest and Sleep;Leisure;Social Participation    Rehab Potential  Fair    Current Impairments/barriers affecting progress:  signficant perceptual deficits, apraxia, cognitive deficits    OT Frequency  2x / week    OT Duration  8 weeks    OT Treatment/Interventions  Self-care/ADL training;Aquatic Therapy;Electrical Stimulation;DME and/or AE instruction;Neuromuscular education;Therapeutic exercise;Functional Mobility Training;Manual Therapy;Therapeutic activities;Cognitive remediation/compensation;Balance training;Patient/family education;Visual/perceptual remediation/compensation    Plan  place on hold for 2 weeks to allow pt and wife to continue to work basic ADL's and IADL's    Consulted and Agree with Plan of Care  Patient    Family Member Consulted  son       Patient will benefit from skilled therapeutic intervention in order to improve the following deficits and impairments:  Abnormal gait, Decreased activity tolerance, Decreased balance, Decreased coordination, Decreased cognition, Decreased knowledge of use of DME, Decreased mobility, Decreased safety awareness, Difficulty walking, Impaired UE functional use, Impaired sensation, Impaired vision/preception  Visit Diagnosis: Unsteadiness on feet  Hemiplegia and hemiparesis following cerebral infarction affecting left non-dominant side (HCC)  Other lack of coordination  Left-sided neglect  Visuospatial deficit  Other symptoms and signs involving cognitive functions following cerebral infarction  Other disturbances of skin sensation  Apraxia  Other symptoms and signs involving the nervous system    Problem List Patient Active Problem List   Diagnosis Date Noted  . Acute ischemic right MCA stroke ([place]) 07/26/2017  . Hyperlipidemia 07/23/2017  . Diastolic dysfunction   . Acute blood loss anemia   . Essential hypertension 07/22/2017  . Type 2 diabetes  mellitus with renal manifestations ([place]) 07/22/2017  . Acute CVA (cerebrovascular accident) ([place]) 07/21/2017  . Chronic kidney disease, stage III (moderate) ([place]) 02/19/2013  . Atherosclerotic peripheral vascular disease ([place]) 02/19/2013  . Pure hypercholesterolemia 02/19/2013  . Diabetes mellitus, stable ([place]) 02/05/2013    Quay Burow, OTR/L 10/16/2017, 4:14 PM  [facility] [address] [place], [place], [address] Phone: [phone]   Fax:  [phone]  Name: Eric Long MRN: [id number] Date of Birth: 1941/03/24

## 2017-10-16 NOTE — Patient Instructions (Signed)
Dr Letta Pate, Just a quick note to let you know that Eric Long has made good progress overall in therapy however he is struggling with the following:  1. Attention - significant attention deficits including selective, alternating. 2. Eric Long can verbalize the steps of several activities, often even more complex ones, but has great difficulty initiating and executing the steps.   We were wondering if you felt that perhaps adding a stimulant to assist with this might be helpfully?  The patient and family is fully aware of the above issues and also is aware that you would the person to decide if this is appropriate or not.  Thanks for your help.  Forde Radon

## 2017-10-17 ENCOUNTER — Encounter: Payer: Medicare HMO | Attending: Physical Medicine & Rehabilitation

## 2017-10-17 ENCOUNTER — Encounter: Payer: Self-pay | Admitting: Physical Medicine & Rehabilitation

## 2017-10-17 ENCOUNTER — Ambulatory Visit: Payer: Medicare HMO | Admitting: Physical Medicine & Rehabilitation

## 2017-10-17 VITALS — BP 136/80 | HR 90 | Ht 70.5 in | Wt 218.0 lb

## 2017-10-17 DIAGNOSIS — I69398 Other sequelae of cerebral infarction: Secondary | ICD-10-CM | POA: Diagnosis not present

## 2017-10-17 DIAGNOSIS — Z95828 Presence of other vascular implants and grafts: Secondary | ICD-10-CM | POA: Diagnosis not present

## 2017-10-17 DIAGNOSIS — E785 Hyperlipidemia, unspecified: Secondary | ICD-10-CM | POA: Insufficient documentation

## 2017-10-17 DIAGNOSIS — Z87891 Personal history of nicotine dependence: Secondary | ICD-10-CM | POA: Insufficient documentation

## 2017-10-17 DIAGNOSIS — R414 Neurologic neglect syndrome: Secondary | ICD-10-CM

## 2017-10-17 DIAGNOSIS — R269 Unspecified abnormalities of gait and mobility: Secondary | ICD-10-CM

## 2017-10-17 DIAGNOSIS — Z833 Family history of diabetes mellitus: Secondary | ICD-10-CM | POA: Diagnosis not present

## 2017-10-17 DIAGNOSIS — E119 Type 2 diabetes mellitus without complications: Secondary | ICD-10-CM | POA: Insufficient documentation

## 2017-10-17 DIAGNOSIS — I6930 Unspecified sequelae of cerebral infarction: Secondary | ICD-10-CM | POA: Diagnosis not present

## 2017-10-17 MED ORDER — METHYLPHENIDATE HCL 5 MG PO TABS: 5.0000 mg | ORAL_TABLET | Freq: Two times a day (BID) | ORAL | 0 refills | Status: DC

## 2017-10-17 NOTE — Therapy (Signed)
[facility] [address] [place], [place], [address] Phone: [phone]   Fax:  [phone]  Speech Language Pathology Treatment  Patient Details  Name: Eric Long MRN: [id number] Date of Birth: November 06, 1940 Referring Provider: Dr. Alysia Penna   Encounter Date: 10/16/2017  End of Session - 10/17/17 6962    Activity Tolerance  Patient tolerated treatment well;No increased pain       Past Medical History:  Diagnosis Date  . Diabetes mellitus without complication ([place])   . Hyperlipidemia     Past Surgical History:  Procedure Laterality Date  . FEMORAL-FEMORAL BYPASS GRAFT Bilateral 2007    There were no vitals filed for this visit.         ADULT SLP TREATMENT - 10/16/17 0938      General Information   Behavior/Cognition  Alert;Cooperative      Treatment Provided   Treatment provided  Cognitive-Linquistic      Cognitive-Linquistic Treatment   Treatment focused on  Cognition    Skilled Treatment  Son present for ST session - educated son re: pt's ongoing cognitive impairments. Demonstrated cueing techniques. Also educted son in benefit of a daily schedule and daily cognitive activities for his father.  Alternating attention and error awareness targeted today on mildly complex tasks with usual  mod A, due to increased cognitive load. Simple time problem solving with rare min A and 90% accuracy. Usual mod verbal cues for error awareness and correction.  Pt continues to required extended time for processing and cues for initiation.      Assessment / Recommendations / Plan   Plan  Discharge SLP treatment due to (comment)      Progression Toward Goals   Progression toward goals  Not progressing toward goals (comment) pt to return to ST in 3 to 6 months as needed       SLP Education - 10/17/17 0641    Education provided  Yes    Education Details  Son educated re; pt's impairments, progress in [place],  cognitive activities to continue at home and to return to [place] when awareness improves.    Person(s) Educated  Patient;Child(ren) son Francee Piccolo   son Francee Piccolo   Methods  Explanation;Demonstration;Handout    Person(s) Educated  -- son, Francee Piccolo   son, Roger     SPEECH THERAPY DISCHARGE SUMMARY  Visits from [place] of Care: 15  Current functional level related to goals / functional outcomes: See goals below   Remaining deficits: Moderate cognitive linguistic impairment in awareness, attention, executive function, initiation   Education / Equipment: Daily cognitive activities, compensations for attention and memory, use of a schedule to limit TV  Plan: Patient agrees to discharge.  Patient goals were not met. Patient is being discharged due to lack of progress.  ?????          SLP Short Term Goals - 10/17/17 0648      SLP SHORT TERM GOAL #1   Title  Pt will selectively attend to mildly complex cognitive linguistic task with distractions and occasional min redirection    Baseline  09/02/17; 09/04/17, 09/15/17    Time  1    Period  Weeks    Status  Achieved      SLP SHORT TERM GOAL #2   Title  Pt will complete mildy complex organization, reasoning, problem sovling tasks with 85% accuracy and occasional min A    Time  1    Period  Weeks    Status  Not Met      SLP SHORT TERM GOAL #3   Title  Pt will ID errors and self correct 3/5 errors with occasional min A    Time  1    Period  Weeks    Status  Not Met      SLP SHORT TERM GOAL #4   Title  Pt will utilize external aids for medication, schedule management with occasional min A from family outside of therapy over 2 sessions    Baseline  09/23/17    Time  1    Period  Weeks    Status  Partially Met       SLP Long Term Goals - 10/17/17 7062      SLP LONG TERM GOAL #1   Title  Pt will alternate attention between 2 mildly complex cognitive linguistic tasksk with 85% on each and occasional min A    Time  2    Period  Weeks     Status  Not Met      SLP LONG TERM GOAL #2   Title  Pt will self correct errors 4/5 errors with occasional min A    Time  2    Period  Weeks    Status  Not Met      SLP LONG TERM GOAL #3   Title  Pt will recall details from paragraph, attending to the left, with 80% accuracy and occasional min A    Time  2    Period  Weeks    Status  Partially Met       Plan - 10/17/17 3762    Clinical Impression Statement  Progress in ST continues to be limited due to reduced awareness and reduced initiation and processing. Son present - edcuated him re: his father's cognitive impairments and reduced carryover of compensations and cognitive tasks at home. Son is aware of his mother's (pt's primary caregiver) mildl memory impairment, which is affecting carryover of compensations at home. I instructed the son, Francee Piccolo, to have pt return to ST in 3-6 months or when awareness improves. Son agrees. At this time, I reocmmend d/c ST due to pt meeting his ST rehab potential at this time.     Speech Therapy Frequency  2x / week    Treatment/Interventions  Environmental controls;Cueing hierarchy;SLP instruction and feedback;Cognitive reorganization;Functional tasks;Compensatory strategies;Internal/external aids;Patient/family education    Potential Considerations  Ability to learn/carryover information;Severity of impairments;Cooperation/participation level    Consulted and Agree with Plan of Care  Patient;Family member/caregiver    Family Member Consulted  son, Francee Piccolo       Patient will benefit from skilled therapeutic intervention in order to improve the following deficits and impairments:   Cognitive communication deficit    Problem List Patient Active Problem List   Diagnosis Date Noted  . Acute ischemic right MCA stroke ([place]) 07/26/2017  . Hyperlipidemia 07/23/2017  . Diastolic dysfunction   . Acute blood loss anemia   . Essential hypertension 07/22/2017  . Type 2 diabetes mellitus with renal  manifestations ([place]) 07/22/2017  . Acute CVA (cerebrovascular accident) ([place]) 07/21/2017  . Chronic kidney disease, stage III (moderate) ([place]) 02/19/2013  . Atherosclerotic peripheral vascular disease ([place]) 02/19/2013  . Pure hypercholesterolemia 02/19/2013  . Diabetes mellitus, stable ([place]) 02/05/2013    Lovvorn, Annye Rusk MS, [place] 10/17/2017, 6:51 AM  [facility] [address] [place], [place], [address] Phone: [phone]   Fax:  [phone]   Name: Eric Long  Fabro MRN: [id number] Date of Birth: 1941-02-08

## 2017-10-17 NOTE — Progress Notes (Signed)
Subjective:    Patient ID: Eric Long, male    DOB: November 10, 1940, 77 y.o.   MRN: [id number] 77 year old right handed male with history of T2DM and hyperlipidemia who was admitted on 07/22/2017 with two day history of unsteady gait and left-sided weakness.  CT of head revealed hypodensity in parietal and temporal lobe consistent with acute/subacute infarct.  CTA head showed 40-55% R-ICA stenosis 80% L- ICA stenosis and severe stenosis left vertebral artery.  MRI of brain done showing large area of acute ischemia within posterior right hemisphere involving the right MCA  And small portion of right PCA question emboli.  Dr. Leonie Man felt right MCA infarct was due to intracranial and extracranial last vessel extraocular atherosclerosis and dual antiplatelet was recommended.  He is to follow-up with vascular discharge.  Patient with resultant left visual field deficit for safety awareness balance deficits and cognitive deficits.   HPI Patient is still receiving outpatient PT OT and speech therapy.  I received a note from his occupational therapist which indicated that progress in therapy is limited by the patient's ability to attend to tasks, initiate tasks as well as follow-through on more complex multistep tasks.  I discussed this with the patient and his wife.  We discussed that this was not uncommon with this type of stroke.  We also discussed that medication management on temporary basis can be helpful for the symptoms.  We discussed potential side effects of medications including decreased appetite, tachycardia.  Patient is modified independent with all his self-care and mobility.  He is using a small base quad cane to ambulate both inside and outside the home.  Pain Inventory Average Pain 0 Pain Right Now 0 My pain is na  In the last 24 hours, has pain interfered with the following? General activity 0 Relation with others 0 Enjoyment of life 0 What TIME of day is your pain at its worst? na Sleep  (in general) Good  Pain is worse with: na Pain improves with: na Relief from Meds: na  Mobility walk without assistance use a cane ability to climb steps?  yes do you drive?  no  Function retired  Neuro/Psych trouble walking dizziness depression  Prior Studies Any changes since last visit?  no  Physicians involved in your care Any changes since last visit?  no   Family History  Problem Relation Age of Onset  . Diabetes Sister    Social History   Socioeconomic History  . Marital status: Married    Spouse name: Not on file  . Number of children: Not on file  . Years of education: Not on file  . Highest education level: Not on file  Occupational History  . Not on file  Social Needs  . Financial resource strain: Not on file  . Food insecurity:    Worry: Not on file    Inability: Not on file  . Transportation needs:    Medical: Not on file    Non-medical: Not on file  Tobacco Use  . Smoking status: Former Research scientist (life sciences)  . Smokeless tobacco: Never Used  Substance and Sexual Activity  . Alcohol use: No    Frequency: Never  . Drug use: No  . Sexual activity: Not on file  Lifestyle  . Physical activity:    Days per week: Not on file    Minutes per session: Not on file  . Stress: Not on file  Relationships  . Social connections:    Talks on phone: Not on  file    Gets together: Not on file    Attends religious service: Not on file    Active member of club or organization: Not on file    Attends meetings of clubs or organizations: Not on file    Relationship status: Not on file  Other Topics Concern  . Not on file  Social History Narrative  . Not on file   Past Surgical History:  Procedure Laterality Date  . FEMORAL-FEMORAL BYPASS GRAFT Bilateral 2007   Past Medical History:  Diagnosis Date  . Diabetes mellitus without complication ([place])   . Hyperlipidemia    There were no vitals taken for this visit.  Opioid Risk Score:   Fall Risk Score:   `1  Depression screen PHQ 2/9  Depression screen [facility] 2/9 08/18/2017 10/21/2016 07/11/2014  Decreased Interest 3 0 0  Down, Depressed, Hopeless 1 0 0  PHQ - 2 Score 4 0 0  Altered sleeping 1 - -  Tired, decreased energy 1 - -  Change in appetite 1 - -  Feeling bad or failure about yourself  0 - -  Trouble concentrating 3 - -  Moving slowly or fidgety/restless 0 - -  Suicidal thoughts 0 - -  PHQ-9 Score 10 - -  Difficult doing work/chores Somewhat difficult - -     Review of Systems  Constitutional: Negative.   HENT: Negative.   Eyes: Negative.   Respiratory: Negative.   Cardiovascular: Negative.   Gastrointestinal: Negative.   Endocrine: Negative.   Genitourinary: Negative.   Musculoskeletal: Positive for gait problem.  Skin: Negative.   Allergic/Immunologic: Negative.   Neurological: Positive for dizziness.  Hematological: Negative.   Psychiatric/Behavioral: Positive for dysphoric mood.  All other systems reviewed and are negative.      Objective:   Physical Exam  Constitutional: He is oriented to person, place, and time. He appears well-developed and well-nourished. No distress.  HENT:  Head: Normocephalic and atraumatic.  Neurological: He is alert and oriented to person, place, and time.  Patient is unable to perform serial sevens he is able to correctly get to 93 but the next 2 numbers are 66 and 69.  He does not recognize his errors. He is able to spell the word world forwards and backwards.  Gait is without evidence of toe drag or knee instability but with increased base of support Romberg is positive there is visual neglect of the left field No evidence of tactile neglect   Skin: Skin is warm and dry. He is not diaphoretic.  Psychiatric: He has a normal mood and affect.  Nursing note and vitals reviewed.   Motor strength is 4+ in the left deltoid bicep tricep grip 5 in the left hip flexor knee extensor 4 and left ankle dorsiflexor 5/5 in the right deltoid,  bicep, tricep, grip, hip flexor, knee extensor, ankle dorsiflexor      Assessment & Plan:  1.  Right MCA PCA infarct with residual left neglect, cognitive deficits with reduced attention concentration and initiation as well as gait disorder.  Continue outpatient PT OT speech Trial of methylphenidate 5 mg every morning and q. noon.  Discussed not to take any doses later than lunchtime We discussed the possibility of having to increase the dose to 10 mg if no significant effect.  We discussed potential side effects and that he would call if he experiences any palpitations loss of appetite or other side effects. Return to clinic in 4 weeks

## 2017-10-18 DIAGNOSIS — R69 Illness, unspecified: Secondary | ICD-10-CM | POA: Diagnosis not present

## 2017-10-22 ENCOUNTER — Telehealth: Payer: Self-pay

## 2017-10-22 NOTE — Telephone Encounter (Signed)
Recieved e-mail from pharmacy stating the need for a prior auth for methylphenidate.  Started prior [place] today 10-22-17

## 2017-10-23 ENCOUNTER — Other Ambulatory Visit (INDEPENDENT_AMBULATORY_CARE_PROVIDER_SITE_OTHER): Payer: Medicare HMO

## 2017-10-23 DIAGNOSIS — Z794 Long term (current) use of insulin: Secondary | ICD-10-CM | POA: Diagnosis not present

## 2017-10-23 DIAGNOSIS — E1165 Type 2 diabetes mellitus with hyperglycemia: Secondary | ICD-10-CM

## 2017-10-23 LAB — BASIC METABOLIC PANEL
BUN: 21 mg/dL (ref 6–23)
CALCIUM: 9.5 mg/dL (ref 8.4–10.5)
CO2: 30 mEq/L (ref 19–32)
Chloride: 102 mEq/L (ref 96–112)
Creatinine, Ser: 1.53 mg/dL — ABNORMAL HIGH (ref 0.40–1.50)
GFR: 56.69 mL/min — AB (ref >60.00)
Glucose, Bld: 108 mg/dL — ABNORMAL HIGH (ref 70–99)
Potassium: 4.8 mEq/L (ref 3.5–5.1)
SODIUM: 137 meq/L (ref 135–145)

## 2017-10-24 LAB — FRUCTOSAMINE: Fructosamine: 229 umol/L (ref 0–285)

## 2017-10-28 NOTE — Telephone Encounter (Signed)
Recieved denied status on 10-25-2017, filed appeal on 10-27-17 verbally on phone with insurance company, recieved approved through appeal status on 10-28-17

## 2017-10-30 ENCOUNTER — Ambulatory Visit (INDEPENDENT_AMBULATORY_CARE_PROVIDER_SITE_OTHER): Payer: Medicare HMO | Admitting: Endocrinology

## 2017-10-30 ENCOUNTER — Encounter: Payer: Self-pay | Admitting: Endocrinology

## 2017-10-30 VITALS — BP 102/58 | HR 102 | Ht 70.5 in | Wt 210.4 lb

## 2017-10-30 DIAGNOSIS — E1165 Type 2 diabetes mellitus with hyperglycemia: Secondary | ICD-10-CM

## 2017-10-30 DIAGNOSIS — Z794 Long term (current) use of insulin: Secondary | ICD-10-CM | POA: Diagnosis not present

## 2017-10-30 MED ORDER — METFORMIN HCL ER 750 MG PO TB24: 750.0000 mg | ORAL_TABLET | Freq: Every day | ORAL | 1 refills | Status: AC

## 2017-10-30 NOTE — Patient Instructions (Signed)
Take metformin at [facility]

## 2017-10-30 NOTE — Progress Notes (Signed)
Patient ID: Eric Long, male   DOB: 09/09/1940, 77 y.o.   MRN: [id number]   Reason for Appointment:  follow-up of diabetes  History of Present Illness   Diagnosis: Type 2 DIABETES MELITUS, date of diagnosis: 2006      Previous history: He was previously treated with metformin, Amaryl and Januvia but subsequently had poor control with A1c of 11.8 in 2011 and 11.6 in 7/13 In 8/13 is Januvia was stopped and he was started on Victoza and Lantus insulin With this his blood sugars were improved significantly and his Amaryl was stopped. He also started losing weight His A1c in 6/14 was excellent at 6.3  Recent history:    Insulin regimen: Tresiba 18 units at SUPPER, Novolog: None        Non-insulin hypoglycemic drugs: Metformin 2 g daily, Victoza 1.8 mg daily IN AM       His A1c has been recently better and was in April 7.2   Current management, blood sugar patterns and problems identified:   He apparently was stopped from taking metformin in March during his hospitalization and this was not apparent on his last visit  He appears to have relatively high FASTING blood sugars and higher readings in the mornings around noon also but not postprandially  His weight has gone down significantly with his trying to watch his diet with the help of his family and he appears more motivated  He is taking Victoza consistently in the morning and is taking 1.8 mg without nausea  On his last visit his blood sugars had come down more significantly, averaging only 108 but they are gradually rising now  He is able to walk with the help of a walker but not doing any formal exercise       Meal times: Breakfast usually 9 AM: eggs, toast or oatmeal, dinner-7 PM  Monitors blood glucose:  about once a day .    Glucometer: One Probation officer.          Blood Glucose readings from review of monitor download:  Mean values apply above for all meters except median for One Touch  PRE-MEAL Fasting  Lunch Dinner Bedtime Overall  Glucose range:  129-142      Mean/median:   142    144   POST-MEAL PC Breakfast PC Lunch PC Dinner  Glucose range:  131-210   95-181  Mean/median:    146    Physical activity: exercise: Physical therapy  Dietician visit:  6/18, has seen CDE in 3/78         Complications: are: Peripheral vascular disease, neuropathy     Wt Readings from Last 3 Encounters:  10/30/17 210 lb 6.4 oz (95.4 kg)  10/17/17 218 lb (98.9 kg)  09/16/17 212 lb (96.2 kg)     LABS:  Lab Results  Component Value Date   HGBA1C 7.2 (H) 08/19/2017   HGBA1C 7.6 (H) 07/22/2017   HGBA1C 7.4 05/22/2017   Lab Results  Component Value Date   MICROALBUR 1.7 01/29/2017   LDLCALC 48 08/19/2017   CREATININE 1.53 (H) 10/23/2017   No visits with results within 1 Week(s) from this visit.  Latest known visit with results is:  Lab on 10/23/2017  Component Date Value Ref Range Status  . Fructosamine 10/23/2017 229  0 - 285 umol/L Final   Comment: Published reference interval for apparently healthy subjects between age 53 and 36 is 24 - 285 umol/L and in a poorly controlled diabetic population  is 228 - 563 umol/L with a mean of 396 umol/L.   Mar[place] Kitchen Sodium 10/23/2017 137  135 - 145 mEq/L Final  . Potassium 10/23/2017 4.8  3.5 - 5.1 mEq/L Final  . Chloride 10/23/2017 102  96 - 112 mEq/L Final  . CO2 10/23/2017 30  19 - 32 mEq/L Final  . Glucose, Bld 10/23/2017 108* 70 - 99 mg/dL Final  . BUN 10/23/2017 21  6 - 23 mg/dL Final  . Creatinine, Ser 10/23/2017 1.53* 0.40 - 1.50 mg/dL Final  . Calcium 10/23/2017 9.5  8.4 - 10.5 mg/dL Final  . GFR 10/23/2017 56.69* >60.00 mL/min Final       Allergies as of 10/30/2017   No Known Allergies     Medication List        Accurate as of 10/30/17  3:08 PM. Always use your most recent med list.          acetaminophen 325 MG tablet Commonly known as:  TYLENOL Take 1-2 tablets (325-650 mg total) by mouth every 4 (four) hours as needed for mild  pain.   aspirin 325 MG EC tablet Take 1 tablet (325 mg total) by mouth daily.   benazepril 5 MG tablet Commonly known as:  LOTENSIN TAKE 1 TABLET BY MOUTH ONCE DAILY   clopidogrel 75 MG tablet Commonly known as:  PLAVIX TAKE 1 TABLET BY MOUTH ONCE DAILY   ezetimibe 10 MG tablet Commonly known as:  ZETIA TAKE 1 TABLET BY MOUTH ONCE DAILY   glucose blood test strip Commonly known as:  ONETOUCH VERIO Use to check blood sugar 2 times per day by rotation of meals. Dx code: E11.9   glucose blood test strip Commonly known as:  ONETOUCH VERIO Use as instructed to check blood sugar 2 times daily.   liraglutide 18 MG/3ML Sopn Commonly known as:  VICTOZA INJECT 0.3 MLS  (1.8 MG TOTAL) INTO THE SKIN ONCE A DAY   methylphenidate 5 MG tablet Commonly known as:  RITALIN Take 1 tablet (5 mg total) by mouth 2 (two) times daily.   metoprolol succinate 50 MG 24 hr tablet Commonly known as:  TOPROL-XL TAKE 1 TABLET BY MOUTH ONCE DAILY AFTER  BREAKFAST   RELION PEN NEEDLES 32G X 4 MM Misc Generic drug:  Insulin Pen Needle USE ONE PEN NEEDLE TWICE DAILY   rosuvastatin 40 MG tablet Commonly known as:  CRESTOR Take 1 tablet (40 mg total) by mouth daily.   TRESIBA FLEXTOUCH [place] Inject 15 Units into the skin daily.       Allergies:  No Known Allergies  Past Medical History:  Diagnosis Date  . Diabetes mellitus without complication ([place])   . Hyperlipidemia     Past Surgical History:  Procedure Laterality Date  . FEMORAL-FEMORAL BYPASS GRAFT Bilateral 2007    Family History  Problem Relation Age of Onset  . Diabetes Sister     Social History:  reports that he has quit smoking. He has never used smokeless tobacco. He reports that he does not drink alcohol or use drugs.  Review of Systems:  Hypertension:   His medications have been changed since his hospital discharge and he is taking lower doses of benazepril only now Amlodipine was stopped on his last visit   BP  Readings from Last 3 Encounters:  10/30/17 (!) 102/58  10/17/17 136/80  10/02/17 139/70    His creatinine is upper normal but no appears to be more consistently higher   Lab Results  Component Value Date  CREATININE 1.53 (H) 10/23/2017   CREATININE 1.50 08/19/2017   CREATININE 1.31 (H) 08/06/2017     Lipids: Has had mixed hyperlipidemia and also significantly high LDL Previously had persistently high LDL value Even though he kept insisting that he was taking his Crestor and Zetia regularly in the past his labs were consistently abnormal Recently without any change in medications his cholesterol is markedly improved His family thinks that he is taking his medications regularly now also    Lab Results  Component Value Date   CHOL 97 08/19/2017   HDL 24.20 (L) 08/19/2017   LDLCALC 48 08/19/2017   LDLDIRECT 151.0 03/17/2017   TRIG 123.0 08/19/2017   CHOLHDL 4 08/19/2017         Examination:   BP (!) 102/58 (BP Location: Left Arm, Patient Position: Sitting, Cuff Size: Normal)   Pulse (!) 102   Ht 5' 10.5" (1.791 m)   Wt 210 lb 6.4 oz (95.4 kg)   SpO2 98%   BMI 29.76 kg/m   Body mass index is 29.76 kg/m.    ASSESSMENT/ PLAN:   Diabetes type 2 with mild obesity  See history of present illness for detailed discussion of his current management, blood sugar patterns and problems identified  His blood sugars are still fairly well controlled although starting to get higher than on his last visit His average blood sugar even fasting is about 135 and most likely he is seeing higher readings because of stopping metformin Has occasional postprandial readings around 200 but mostly after breakfast and sometimes lunch despite taking Victoza  Since he may benefit from metformin again instead of starting mealtime insulin will have him try at least 750 mg daily considering his somewhat abnormal renal function  Will recheck A1c on the next visit Encouraged him to continue  checking blood sugars at various times as before and follow good diet  There are no Patient Instructions on file for this visit.   Elayne Snare 10/30/2017, 3:08 PM

## 2017-11-03 ENCOUNTER — Ambulatory Visit: Payer: Medicare HMO | Attending: Physical Medicine & Rehabilitation | Admitting: Occupational Therapy

## 2017-11-03 ENCOUNTER — Encounter: Payer: Self-pay | Admitting: Occupational Therapy

## 2017-11-03 DIAGNOSIS — I69318 Other symptoms and signs involving cognitive functions following cerebral infarction: Secondary | ICD-10-CM

## 2017-11-03 DIAGNOSIS — R278 Other lack of coordination: Secondary | ICD-10-CM | POA: Diagnosis not present

## 2017-11-03 DIAGNOSIS — R414 Neurologic neglect syndrome: Secondary | ICD-10-CM | POA: Diagnosis not present

## 2017-11-03 DIAGNOSIS — R2681 Unsteadiness on feet: Secondary | ICD-10-CM | POA: Diagnosis not present

## 2017-11-03 DIAGNOSIS — R208 Other disturbances of skin sensation: Secondary | ICD-10-CM | POA: Diagnosis not present

## 2017-11-03 DIAGNOSIS — R41842 Visuospatial deficit: Secondary | ICD-10-CM | POA: Diagnosis not present

## 2017-11-03 DIAGNOSIS — R482 Apraxia: Secondary | ICD-10-CM | POA: Diagnosis not present

## 2017-11-03 DIAGNOSIS — R2689 Other abnormalities of gait and mobility: Secondary | ICD-10-CM | POA: Insufficient documentation

## 2017-11-03 DIAGNOSIS — I69354 Hemiplegia and hemiparesis following cerebral infarction affecting left non-dominant side: Secondary | ICD-10-CM | POA: Insufficient documentation

## 2017-11-03 DIAGNOSIS — R29818 Other symptoms and signs involving the nervous system: Secondary | ICD-10-CM | POA: Diagnosis not present

## 2017-11-03 NOTE — Pre-Procedure Instructions (Addendum)
Eric Long  11/03/2017      [facility] [address] [place], [place] [address] Phone: [phone] Fax: [phone]    Your procedure is scheduled on Wed., November 12, 2017 from 8:30AM-10:44AM  Report to [facility] Admitting Entrance "A" at 6:30AM  Call this number if you have problems the morning of surgery:  [phone]   Remember:  Do not eat or drink after midnight on July 9th    Take these medicines the morning of surgery with A SIP OF WATER: Metoprolol succinate (TOPROL-XL). If needed Acetaminophen (TYLENOL)   Follow your doctors instructions regarding your Aspirin and Plavix.  If no instructions were given by your doctor, then you will need to call the prescribing office office to get instructions.    7 days before surgery (7/3), stop taking all Other Aspirin Products, Vitamins, Fish oils, and Herbal medications. Also stop all NSAIDS i.e. Advil, Ibuprofen, Motrin, Aleve, Anaprox, Naproxen, BC, Goody Powders, and all Supplements.  How to Manage Your Diabetes Before and After Surgery  Why is it important to control my blood sugar before and after surgery? . Improving blood sugar levels before and after surgery helps healing and can limit problems. . A way of improving blood sugar control is eating a healthy diet by: o  Eating less sugar and carbohydrates o  Increasing activity/exercise o  Talking with your doctor about reaching your blood sugar goals . High blood sugars (greater than 180 mg/dL) can raise your risk of infections and slow your recovery, so you will need to focus on controlling your diabetes during the weeks before surgery. . Make sure that the doctor who takes care of your diabetes knows about your planned surgery including the date and location.  How do I manage my blood sugar before surgery? . Check your blood sugar at least 4 times a day, starting 2 days before surgery, to make  sure that the level is not too high or low. o Check your blood sugar the morning of your surgery when you wake up and every 2 hours until you get to the Short Stay unit. . If your blood sugar is less than 70 mg/dL, you will need to treat for low blood sugar: o Do not take insulin. o Treat a low blood sugar (less than 70 mg/dL) with  cup of clear juice (cranberry or apple), 4 glucose tablets, OR glucose gel. Recheck blood sugar in 15 minutes after treatment (to make sure it is greater than 70 mg/dL). If your blood sugar is not greater than 70 mg/dL on recheck, call [phone] o  for further instructions. . Report your blood sugar to the short stay nurse when you get to Short Stay.  . If you are admitted to the hospital after surgery: o Your blood sugar will be checked by the staff and you will probably be given insulin after surgery (instead of oral diabetes medicines) to make sure you have good blood sugar levels. o The goal for blood sugar control after surgery is 80-180 mg/dL.  WHAT DO I DO ABOUT MY DIABETES MEDICATION?  Marland Kitchen Do not take MetFORMIN (GLUCOPHAGE-XR) the morning of surgery.  . THE MORNING OF SURGERY, take ____7_______ units of ____Tresiba_______insulin.      . The day of surgery, do not take other diabetes injectable Victoza (liraglutide)  . If your CBG is greater than 220 mg/dL, inform the staff upon arrival to Short  Stay.  Reviewed and Endorsed by [facility] Patient Education Committee, August 2015    Do not wear jewelry.  Do not wear lotions, powders, colognes, or deodorant.  Do not shave 48 hours prior to surgery.  Men may shave face.  Do not bring valuables to the hospital.  [facility] is not responsible for any belongings or valuables.  Contacts, dentures or bridgework may not be worn into surgery.  Leave your suitcase in the car.  After surgery it may be brought to your room.  For patients admitted to the hospital, discharge time will be determined by your  treatment team.  Patients discharged the day of surgery will not be allowed to drive home.   Special instructions:   - Preparing For Surgery  Before surgery, you can play an important role. Because skin is not sterile, your skin needs to be as free of germs as possible. You can reduce the number of germs on your skin by washing with CHG (chlorahexidine gluconate) Soap before surgery.  CHG is an antiseptic cleaner which kills germs and bonds with the skin to continue killing germs even after washing.    Oral Hygiene is also important to reduce your risk of infection.  Remember - BRUSH YOUR TEETH THE MORNING OF SURGERY WITH YOUR REGULAR TOOTHPASTE  Please do not use if you have an allergy to CHG or antibacterial soaps. If your skin becomes reddened/irritated stop using the CHG.  Do not shave (including legs and underarms) for at least 48 hours prior to first CHG shower. It is OK to shave your face.  Please follow these instructions carefully.   1. Shower the NIGHT BEFORE SURGERY and the MORNING OF SURGERY with CHG.   2. If you chose to wash your hair, wash your hair first as usual with your normal shampoo.  3. After you shampoo, rinse your hair and body thoroughly to remove the shampoo.  4. Use CHG as you would any other liquid soap. You can apply CHG directly to the skin and wash gently with a scrungie or a clean washcloth.   5. Apply the CHG Soap to your body ONLY FROM THE NECK DOWN.  Do not use on open wounds or open sores. Avoid contact with your eyes, ears, mouth and genitals (private parts). Wash Face and genitals (private parts)  with your normal soap.  6. Wash thoroughly, paying special attention to the area where your surgery will be performed.  7. Thoroughly rinse your body with warm water from the neck down.  8. DO NOT shower/wash with your normal soap after using and rinsing off the CHG Soap.  9. Pat yourself dry with a CLEAN TOWEL.  10. Wear CLEAN PAJAMAS to  bed the night before surgery, wear comfortable clothes the morning of surgery  11. Place CLEAN SHEETS on your bed the night of your first shower and DO NOT SLEEP WITH PETS.  Day of Surgery:  Do not apply any deodorants/lotions.  Please wear clean clothes to the hospital/surgery center.   Remember to brush your teeth WITH YOUR REGULAR TOOTHPASTE.  Please read over the following fact sheets that you were given. Pain Booklet, Coughing and Deep Breathing, MRSA Information and Surgical Site Infection Prevention

## 2017-11-03 NOTE — Therapy (Signed)
[facility] [address] [place], [place], [address] Phone: [phone]   Fax:  [phone]  Occupational Therapy Treatment  Patient Details  Name: Eric Long MRN: [id number] Date of Birth: August 15, 1940 Referring Provider: Dr. Letta Pate   Encounter Date: 11/03/2017  OT End of Session - 11/03/17 1308    Visit Number  16    Number of Visits  29    Date for OT Re-Evaluation  12/04/17    Authorization Type   medicare     Authorization Time Period  90 days    OT Start Time  1147    OT Stop Time  1229    OT Time Calculation (min)  42 min    Activity Tolerance  Patient tolerated treatment well       Past Medical History:  Diagnosis Date  . Diabetes mellitus without complication ([place])   . Hyperlipidemia     Past Surgical History:  Procedure Laterality Date  . FEMORAL-FEMORAL BYPASS GRAFT Bilateral 2007    There were no vitals filed for this visit.  Subjective Assessment - 11/03/17 1152    Subjective   I can now get dressed by myself    Patient is accompained by:  Family member wife in waiting room    Pertinent History  R MCA CVA, acute encephalopathy, severe L carotid stenosis.      Patient Stated Goals  back to where I am stable with walking, use the computer, continue reading and writing.     Currently in Pain?  No/denies                   OT Treatments/Exercises (OP) - 11/03/17 0001      ADLs   ADL Comments  Reviewed current goals -pt reports that he is now able to dress himself and just asks his wife to "check and make sure everything is on ok - and it has been."  Pt also reports that he goes to the bathroom by himself during the day; wife does provide distant supervsion for shower transfers.  Table top scanning at 9M level 100% accuracy. Environmental scannng tasks today at 90% accuracy in busy clinic.       Visual/Perceptual Exercises   Other Exercises  Addressed environmental scanning  tasks - out of three trials pt with 90% accuracy in busy clinic.  Pt reports that he is having L carotid endarterectomy on 11/12/2017.        Neurological Re-education Exercises   Other Exercises 1  Addressed dynamic standing balance with pt navigating several step up blocks of varying heights - pt with 1 LOB during task due to not getting L foot fully on block.  Pt with improved gait speed today without cueing resulting in being more stable with functional ambulation.                 OT Short Term Goals - 11/03/17 1305      OT SHORT TERM GOAL #1   Title  Pt and family will be mod I for home activities program for L field cut/L neglect - 09/09/2017    Status  Achieved      OT SHORT TERM GOAL #2   Title  Assessed 9 hole peg and determine goal if appropriate.    Status  Achieved see PN      OT SHORT TERM GOAL #3   Title  Assess Box and blocks and determine goal if appropriate    Status  Achieved L= 38      OT SHORT TERM GOAL #4   Title  Pt will be supevision for UB dressing     Status  Achieved      OT SHORT TERM GOAL #5   Title  Pt will need no more than 3 vc's for clothing orientation when dressing.    Status  Achieved      OT SHORT TERM GOAL #6   Title  Pt will be min a, mod cues for LB dressing    Status  Achieved      OT SHORT TERM GOAL #7   Title  Pt will locate items during functional tasks on L with no more than min cues     Status  On-going      OT SHORT TERM GOAL #8   Title  Pt will be supervision for simple bev and snack prep - 11/06/2017    Status  On-going        OT Long Term Goals - 11/03/17 1305      OT LONG TERM GOAL #1   Title  Pt and family will be mod I upgraded HEP prn - 12/04/2017    Status  Achieved      OT LONG TERM GOAL #2   Title  Pt will demonstrate envrionmental scanning during functional task with at least 90% accuracy    Status  Partially Met activity dependent and varies from day to day      OT LONG TERM GOAL #3   Title  Pt will be  able to complete bilateral tasks with no more than min cues to attend to L hand    Status  Achieved      OT LONG TERM GOAL #4   Title  Pt will be mod I with eating    Status  Achieved      OT LONG TERM GOAL #5   Title  Pt will be supervision for simple yard tasks.    Status  Deferred at pts request      OT LONG TERM GOAL #6   Title  Pt will be supervision with dressing    Status  Achieved      OT LONG TERM GOAL #7   Title  Pt will be mod I with toilet and shower transfers    Status  Partially Met distant supervsion for shower transfers      OT LONG TERM GOAL #8   Title  Pt will be able to read simple paragraph with compensations and min cueing    Status  Achieved            Plan - 11/03/17 1306    Clinical Impression Statement  Pt progressing toward goals. Pt with improved attention, initiation and gait speed today. Pt also reports improvements in dressing and functional mobility at home - wife in agreement.     Occupational Profile and client history currently impacting functional performance  PMH:  DM, HTN, HLD, chronic kidney disease, PVD, severe L extracranial carotid stenosis.      Occupational performance deficits (Please refer to evaluation for details):  ADL's;IADL's;Rest and Sleep;Leisure;Social Participation    Rehab Potential  Fair    Current Impairments/barriers affecting progress:  signficant perceptual deficits, apraxia, cognitive deficits    OT Frequency  2x / week    OT Duration  8 weeks    OT Treatment/Interventions  Self-care/ADL training;Aquatic Therapy;Electrical Stimulation;DME and/or AE instruction;Neuromuscular education;Therapeutic exercise;Functional Mobility Training;Manual Therapy;Therapeutic activities;Cognitive remediation/compensation;Balance training;Patient/family education;Visual/perceptual  remediation/compensation    Plan  address remaining STG's and then consider d/c from OT - pt and wife in agreement.    Consulted and Agree with Plan of Care   Patient;Family member/caregiver    Family Member Consulted  wife        Patient will benefit from skilled therapeutic intervention in order to improve the following deficits and impairments:  Abnormal gait, Decreased activity tolerance, Decreased balance, Decreased coordination, Decreased cognition, Decreased knowledge of use of DME, Decreased mobility, Decreased safety awareness, Difficulty walking, Impaired UE functional use, Impaired sensation, Impaired vision/preception  Visit Diagnosis: Unsteadiness on feet  Hemiplegia and hemiparesis following cerebral infarction affecting left non-dominant side (HCC)  Other lack of coordination  Left-sided neglect  Visuospatial deficit  Other symptoms and signs involving cognitive functions following cerebral infarction  Other disturbances of skin sensation  Apraxia  Other symptoms and signs involving the nervous system    Problem List Patient Active Problem List   Diagnosis Date Noted  . Acute ischemic right MCA stroke ([place]) 07/26/2017  . Hyperlipidemia 07/23/2017  . Diastolic dysfunction   . Acute blood loss anemia   . Essential hypertension 07/22/2017  . Type 2 diabetes mellitus with renal manifestations ([place]) 07/22/2017  . Acute CVA (cerebrovascular accident) ([place]) 07/21/2017  . Chronic kidney disease, stage III (moderate) ([place]) 02/19/2013  . Atherosclerotic peripheral vascular disease ([place]) 02/19/2013  . Pure hypercholesterolemia 02/19/2013  . Diabetes mellitus, stable ([place]) 02/05/2013    Quay Burow, OTR/L 11/03/2017, 1:10 PM  [facility] [address] [place], [place], [address] Phone: [phone]   Fax:  [phone]  Name: Eric Long MRN: [id number] Date of Birth: January 08, 1941

## 2017-11-04 ENCOUNTER — Encounter (HOSPITAL_COMMUNITY): Payer: Self-pay

## 2017-11-04 ENCOUNTER — Other Ambulatory Visit (HOSPITAL_COMMUNITY): Payer: Medicare HMO

## 2017-11-04 ENCOUNTER — Encounter (HOSPITAL_COMMUNITY)
Admission: RE | Admit: 2017-11-04 | Discharge: 2017-11-04 | Disposition: A | Discharge status: Home / Self Care | Payer: Medicare HMO | Source: Ambulatory Visit | Attending: Vascular Surgery | Admitting: Vascular Surgery

## 2017-11-04 ENCOUNTER — Other Ambulatory Visit: Payer: Self-pay

## 2017-11-04 DIAGNOSIS — I6522 Occlusion and stenosis of left carotid artery: Secondary | ICD-10-CM | POA: Diagnosis not present

## 2017-11-04 DIAGNOSIS — Z01812 Encounter for preprocedural laboratory examination: Secondary | ICD-10-CM | POA: Insufficient documentation

## 2017-11-04 DIAGNOSIS — E785 Hyperlipidemia, unspecified: Secondary | ICD-10-CM | POA: Insufficient documentation

## 2017-11-04 DIAGNOSIS — Z8673 Personal history of transient ischemic attack (TIA), and cerebral infarction without residual deficits: Secondary | ICD-10-CM | POA: Insufficient documentation

## 2017-11-04 DIAGNOSIS — E119 Type 2 diabetes mellitus without complications: Secondary | ICD-10-CM | POA: Diagnosis not present

## 2017-11-04 HISTORY — DX: Occlusion and stenosis of left carotid artery: I65.22

## 2017-11-04 HISTORY — DX: Cerebral infarction, unspecified: I63.9

## 2017-11-04 LAB — COMPREHENSIVE METABOLIC PANEL
ALBUMIN: 3.4 g/dL — AB (ref 3.5–5.0)
ALK PHOS: 96 U/L (ref 38–126)
ALT: 22 U/L (ref 0–44)
ANION GAP: 9 (ref 5–15)
AST: 18 U/L (ref 15–41)
BILIRUBIN TOTAL: 0.5 mg/dL (ref 0.3–1.2)
BUN: 20 mg/dL (ref 8–23)
CALCIUM: 9.3 mg/dL (ref 8.9–10.3)
CO2: 27 mmol/L (ref 22–32)
Chloride: 104 mmol/L (ref 98–111)
Creatinine, Ser: 1.91 mg/dL — ABNORMAL HIGH (ref 0.61–1.24)
GFR calc Af Amer: 37 mL/min — ABNORMAL LOW (ref >60)
GFR, EST NON AFRICAN AMERICAN: 32 mL/min — AB (ref >60)
GLUCOSE: 125 mg/dL — AB (ref 70–99)
POTASSIUM: 4.4 mmol/L (ref 3.5–5.1)
Sodium: 140 mmol/L (ref 135–145)
TOTAL PROTEIN: 7.8 g/dL (ref 6.5–8.1)

## 2017-11-04 LAB — URINALYSIS, ROUTINE W REFLEX MICROSCOPIC
BACTERIA UA: NONE SEEN
BILIRUBIN URINE: NEGATIVE
GLUCOSE, UA: NEGATIVE mg/dL
KETONES UR: 5 mg/dL — AB
Leukocytes, UA: NEGATIVE
NITRITE: NEGATIVE
PH: 5 (ref 5.0–8.0)
PROTEIN: 30 mg/dL — AB
Specific Gravity, Urine: 1.018 (ref 1.005–1.030)

## 2017-11-04 LAB — CBC
HEMATOCRIT: 37.1 % — AB (ref 39.0–52.0)
HEMOGLOBIN: 11.3 g/dL — AB (ref 13.0–17.0)
MCH: 29.3 pg (ref 26.0–34.0)
MCHC: 30.5 g/dL (ref 30.0–36.0)
MCV: 96.1 fL (ref 78.0–100.0)
Platelets: 245 10*3/uL (ref 150–400)
RBC: 3.86 MIL/uL — ABNORMAL LOW (ref 4.22–5.81)
RDW: 14.2 % (ref 11.5–15.5)
WBC: 11 10*3/uL — ABNORMAL HIGH (ref 4.0–10.5)

## 2017-11-04 LAB — TYPE AND SCREEN
ABO/RH(D): O POS
Antibody Screen: NEGATIVE

## 2017-11-04 LAB — HEMOGLOBIN A1C
Hgb A1c MFr Bld: 6.6 % — ABNORMAL HIGH (ref 4.8–5.6)
Mean Plasma Glucose: 142.72 mg/dL

## 2017-11-04 LAB — SURGICAL PCR SCREEN
MRSA, PCR: NEGATIVE
Staphylococcus aureus: POSITIVE — AB

## 2017-11-04 LAB — GLUCOSE, CAPILLARY: GLUCOSE-CAPILLARY: 108 mg/dL — AB (ref 70–99)

## 2017-11-04 LAB — PROTIME-INR
INR: 1.02
PROTHROMBIN TIME: 13.3 s (ref 11.4–15.2)

## 2017-11-04 LAB — APTT: APTT: 32 s (ref 24–36)

## 2017-11-04 NOTE — Progress Notes (Signed)
Per Jacqlyn Larsen from Dr. Luther Parody office, the consent is correct for Left Carotid Endarterectomy on 11/12/17. We can only do one side at a time.

## 2017-11-04 NOTE — Progress Notes (Signed)
Pt made aware of the pcr result positive for Staph. Prescription called in to the [facility].

## 2017-11-04 NOTE — Progress Notes (Signed)
PCP - Dr. Harle Battiest  Cardiologist - Denies  Endo- Dr. Elayne Snare  Chest x-ray - Denies  EKG - 07/21/17 (E)  Stress Test - 7 yrs- Neg  ECHO - 07/22/17 (E)  Cardiac Cath - Denies  Sleep Study - Denies CPAP - None  LABS- 11/04/17: CBC, CMP, PT, PTT, T/S, UA  ASA- LD- 7/9 Plavix- LD- 7/9  HA1C- 11/04/17 Fasting Blood Sugar - <100, Today 108 Checks Blood Sugar ___2__ times a day  Anesthesia- Yes- EKG  Pt denies having chest pain, sob, or fever at this time. All instructions explained to the pt, with a verbal understanding of the material. Pt agrees to go over the instructions while at home for a better understanding. The opportunity to ask questions was provided.

## 2017-11-05 ENCOUNTER — Encounter (HOSPITAL_COMMUNITY): Payer: Self-pay

## 2017-11-05 ENCOUNTER — Ambulatory Visit: Payer: Medicare HMO

## 2017-11-05 DIAGNOSIS — R2681 Unsteadiness on feet: Secondary | ICD-10-CM

## 2017-11-05 DIAGNOSIS — R482 Apraxia: Secondary | ICD-10-CM | POA: Diagnosis not present

## 2017-11-05 DIAGNOSIS — I69318 Other symptoms and signs involving cognitive functions following cerebral infarction: Secondary | ICD-10-CM | POA: Diagnosis not present

## 2017-11-05 DIAGNOSIS — R278 Other lack of coordination: Secondary | ICD-10-CM | POA: Diagnosis not present

## 2017-11-05 DIAGNOSIS — I69354 Hemiplegia and hemiparesis following cerebral infarction affecting left non-dominant side: Secondary | ICD-10-CM

## 2017-11-05 DIAGNOSIS — R208 Other disturbances of skin sensation: Secondary | ICD-10-CM | POA: Diagnosis not present

## 2017-11-05 DIAGNOSIS — R2689 Other abnormalities of gait and mobility: Secondary | ICD-10-CM

## 2017-11-05 DIAGNOSIS — R29818 Other symptoms and signs involving the nervous system: Secondary | ICD-10-CM | POA: Diagnosis not present

## 2017-11-05 DIAGNOSIS — R41842 Visuospatial deficit: Secondary | ICD-10-CM | POA: Diagnosis not present

## 2017-11-05 DIAGNOSIS — R414 Neurologic neglect syndrome: Secondary | ICD-10-CM | POA: Diagnosis not present

## 2017-11-05 NOTE — Therapy (Signed)
[facility] [address] [place], [place], [address] Phone: [phone]   Fax:  [phone]  Physical Therapy Treatment  Patient Details  Name: Eric Long MRN: [id number] Date of Birth: 1940/08/01 Referring Provider: Dr. Letta Pate   Encounter Date: 11/05/2017  PT End of Session - 11/05/17 1011    Visit Number  17    Number of Visits  17 requesting one last visit, due to pt missing 1.5 weeks    Date for PT Re-Evaluation  11/10/17    Authorization Type  [employer]    PT Start Time  [phone]    PT Stop Time  1010    PT Time Calculation (min)  39 min    Equipment Utilized During Treatment  Gait belt    Activity Tolerance  Patient tolerated treatment well;No increased pain    Behavior During Therapy  WFL for tasks assessed/performed       Past Medical History:  Diagnosis Date  . Carotid stenosis, asymptomatic, left   . Diabetes mellitus without complication ([place])    Type II  . Hyperlipidemia     Past Surgical History:  Procedure Laterality Date  . FEMORAL-FEMORAL BYPASS GRAFT Bilateral 2007    There were no vitals filed for this visit.  Subjective Assessment - 11/05/17 0934    Subjective  Pt denied falls since last visit. Pt reported Metformin is decr. to once a day now. Pt walked back to the gym with [facility] and not RW.     Patient is accompained by:  Family member wife: Eric Long in lobby    Pertinent History  DM, HLD, PVD, CKD (stage III)    Patient Stated Goals  Return to IND living, walk without concern, build up stamina     Currently in Pain?  Yes    Pain Score  -- 1-2/10    Pain Location  -- legs and arms    Pain Orientation  Left;Right    Pain Descriptors / Indicators  Cramping    Pain Type  Acute pain    Pain Onset  1 to 4 weeks ago    Pain Frequency  Intermittent    Aggravating Factors   feels like tendons are cramping up    Pain Relieving Factors  rest                        [facility] Adult PT Treatment/Exercise - 11/05/17 0937      Ambulation/Gait   Ambulation/Gait  Yes    Ambulation/Gait Assistance  5: Supervision;4: Min guard    Ambulation/Gait Assistance Details  Min guard to S without AD, cues to sequence with SBQC, less cues without AD. Cues to improve L foot ER.     Ambulation Distance (Feet)  600 Feet outdoors no AD and 345' indoors with [facility]    Assistive device  None;Small based quad cane    Gait Pattern  Step-through pattern;Decreased stride length;Decreased dorsiflexion - left;Decreased weight shift to left;Decreased trunk rotation    Ambulation Surface  Level;Unlevel;Indoor;Outdoor;Paved    Gait velocity  2.98 ft/sec and 2.25f/sec no AD      Standardized Balance Assessment   Standardized Balance Assessment  Berg Balance Test;Timed Up and Go Test      Berg Balance Test   Sit to Stand  Able to stand without using hands and stabilize independently    Standing Unsupported  Able to stand safely 2 minutes    Sitting with Back Unsupported  but Feet Supported on Floor or Stool  Able to sit safely and securely 2 minutes    Stand to Sit  Sits safely with minimal use of hands    Transfers  Able to transfer safely, minor use of hands    Standing Unsupported with Eyes Closed  Able to stand 10 seconds safely    Standing Ubsupported with Feet Together  Able to place feet together independently and stand 1 minute safely    From Standing, Reach Forward with Outstretched Arm  Can reach confidently >25 cm (10") 11"    From Standing Position, Pick up Object from Floor  Able to pick up shoe safely and easily    From Standing Position, Turn to Look Behind Over each Shoulder  Looks behind one side only/other side shows less weight shift    Turn 360 Degrees  Able to turn 360 degrees safely but slowly    Standing Unsupported, Alternately Place Feet on Step/Stool  Able to stand independently and complete 8 steps >20 seconds    Standing  Unsupported, One Foot in Front  Able to plae foot ahead of the other independently and hold 30 seconds    Standing on One Leg  Tries to lift leg/unable to hold 3 seconds but remains standing independently    Total Score  48      Timed Up and Go Test   TUG  Normal TUG    Normal TUG (seconds)  10.42 no AD with LOB but corrected by stepping. 15.21 sec. no LOB             PT Education - 11/05/17 1010    Education provided  Yes    Education Details  PT discussed goal progress and potential d/c with pt and pt's wife. PT discussed that if pt gets better/worse, pt can request a new PT order. PT discussed that next visit will likely be the last PT visit. PT educated pt on outcome measure results and how to properly sequence with [facility].    Person(s) Educated  Patient;Spouse    Methods  Explanation;Demonstration;Verbal cues    Comprehension  Returned demonstration       PT Short Term Goals - 09/23/17 1544      PT SHORT TERM GOAL #1   Title  Pt will perform HEP with S to improve balance, endurance, strength, and safety. TARGET DATE FOR ALL STGS: 09/09/17    Status  Achieved      PT SHORT TERM GOAL #2   Title  Pt will perform TUG with LRAD, with S,  in </=25 sec. to decr. falls risk    Baseline  36.37 secs with RW, 19.65 secs without device min/guard A for safety.     Status  Achieved      PT SHORT TERM GOAL #3   Title  Pt will improve gait speed with LRAD to >/=1.24f/sec. to decr. falls risk.     Baseline  2.04 ft/sec with RW, 1.66 ft/sec without device    Status  Achieved      PT SHORT TERM GOAL #4   Title  Perform BERG and write STG and LTG as indicated.     Status  Achieved      PT SHORT TERM GOAL #5   Title  Pt will amb. 300' over even terrain with LRAD and S to improve safety during functional mobility.     Baseline  not met in clinic on 09/22/17, min/guard with max cues for walker negotiation.  Status  Not Met      PT SHORT TERM GOAL #6   Title  Pt will improve BERG score  to >/=35/56 to decr. falls risk.     Baseline  37/56 on 09/23/17    Status  Partially Met        PT Long Term Goals - 11/05/17 1014      PT LONG TERM GOAL #1   Title  Pt will verbalize understanding of CVA risk factors and s/s to decr. risk of CVA. (TARGET DATE FOR ALL LTGS: 10/07/17) all unmet goals will be carried over to new POC: 11/06/17    Status  Achieved      PT LONG TERM GOAL #2   Title  Pt will be IND with HEP to improve strength, safety, balance, and endurance.     Status  Partially Met      PT LONG TERM GOAL #3   Title  Pt will perform TUG with LRAD, at MOD I level, in </=13.5 sec. to decr. falls risk.     Status  Achieved      PT LONG TERM GOAL #4   Title  Pt will improve gait speed to >/=2.36f/sec. with LRAD to amb. safely in the community.     Status  Achieved      PT LONG TERM GOAL #5   Title  Pt will amb. 600' over uneven terrain with LRAD at MOD I level to improve functional mobility and safety.     Status  Partially Met      PT LONG TERM GOAL #6   Title  Pt will improve BERG score to >/=49/56 to decr. falls risk.     Baseline  48/56 on 11/05/17    Status  Partially Met            Plan - 11/05/17 1012    Clinical Impression Statement  Pt demonstrated progress as he met LTGs 1, 3 and 4, pt partially met LTGs 5 an 6. PT will assesss LTG 2 (HEP) next session. PT will likely d/c pt next session, as pt has likely achieved maximal functional potential based on cognitive deficits. PT has educated pt's spouse on safety and sequencing with SBQC. Continue with POC.     Rehab Potential  Good    Clinical Impairments Affecting Rehab Potential  see above.     PT Frequency  2x / week    PT Duration  4 weeks    PT Treatment/Interventions  ADLs/Self Care Home Management;Biofeedback;Canalith Repostioning;Electrical Stimulation;Therapeutic exercise;Therapeutic activities;Manual techniques;Wheelchair mobility training;Vestibular;Functional mobility training;Orthotic  Fit/Training;Stair training;Gait training;Patient/family education;Cognitive remediation;DME Instruction;Neuromuscular re-education;Balance training    PT Next Visit Plan  Check LTG 2 (HEP) and likely d/c.     Consulted and Agree with Plan of Care  Patient;Family member/caregiver    Family Member Consulted  pt's wife ([name]),       Patient will benefit from skilled therapeutic intervention in order to improve the following deficits and impairments:  Abnormal gait, Decreased endurance, Impaired sensation, Postural dysfunction, Impaired flexibility, Impaired tone, Decreased coordination, Decreased safety awareness, Decreased cognition, Decreased range of motion, Dizziness, Decreased balance, Decreased mobility, Decreased activity tolerance, Decreased knowledge of use of DME, Decreased strength, Impaired UE functional use  Visit Diagnosis: Other abnormalities of gait and mobility  Unsteadiness on feet  Hemiplegia and hemiparesis following cerebral infarction affecting left non-dominant side ([facility]     Problem List Patient Active Problem List   Diagnosis Date Noted  . Acute ischemic right MCA  stroke ([place]) 07/26/2017  . Hyperlipidemia 07/23/2017  . Diastolic dysfunction   . Acute blood loss anemia   . Essential hypertension 07/22/2017  . Type 2 diabetes mellitus with renal manifestations ([place]) 07/22/2017  . Acute CVA (cerebrovascular accident) ([place]) 07/21/2017  . Chronic kidney disease, stage III (moderate) ([place]) 02/19/2013  . Atherosclerotic peripheral vascular disease ([place]) 02/19/2013  . Pure hypercholesterolemia 02/19/2013  . Diabetes mellitus, stable ([place]) 02/05/2013    Allyah Heather L 11/05/2017, 10:15 AM  [facility] [address] [place], [place], [address] Phone: [phone]   Fax:  [phone]  Name: SAGAN MASELLI MRN: [id number] Date of Birth: 1940-12-07  Geoffry Paradise, PT,DPT 11/05/17 10:15 AM Phone:  [phone] Fax: [phone]

## 2017-11-05 NOTE — Progress Notes (Signed)
Anesthesia Chart Review:   Case:  [id number] Date/Time:  11/12/17 0815   Procedure:  ENDARTERECTOMY CAROTID LEFT (Left )   Anesthesia type:  General   Pre-op diagnosis:  LEFT CAROTID STENOSIS   Location:  [facility] OR ROOM 11 / [place] [facility]   Surgeon:  Rosetta Posner, MD      DISCUSSION: - Pt is a 77 year old male with hx CVA (07/21/17), HTN, DM, CKD (stage III), PVD  - Hospitalized 3/18-3/22/19 for acute CVA (R posterior MCA infarct). F/u with neurology 6 weeks post-CVA recommended but has not happened  - Pt to stay on ASA and plavix perioperatively   VS: BP (!) 154/68   Pulse 85   Temp 36.9 C   Resp 20   Ht 5' 10.5" (1.791 m)   Wt 211 lb 12.8 oz (96.1 kg)   SpO2 98%   BMI 29.96 kg/m    PROVIDERS: - PCP is Lawerance Cruel, MD - Endocrinologist is Elayne Snare, MD  LABS:  - Cr 1.91, BUN 20. Baseline Cr ~1.4. Hx CKD stage III.  Will recheck renal function day of surgery. I routed CMP results to Dr. Donnetta Hutching for review.   - HbA1c 6.6, glucose 125  (all labs ordered are listed, but only abnormal results are displayed)  Labs Reviewed  SURGICAL PCR SCREEN - Abnormal; Notable for the following components:      Result Value   Staphylococcus aureus POSITIVE (*)    All other components within normal limits  GLUCOSE, CAPILLARY - Abnormal; Notable for the following components:   Glucose-Capillary 108 (*)    All other components within normal limits  CBC - Abnormal; Notable for the following components:   WBC 11.0 (*)    RBC 3.86 (*)    Hemoglobin 11.3 (*)    HCT 37.1 (*)    All other components within normal limits  COMPREHENSIVE METABOLIC PANEL - Abnormal; Notable for the following components:   Glucose, Bld 125 (*)    Creatinine, Ser 1.91 (*)    Albumin 3.4 (*)    GFR calc non Af Amer 32 (*)    GFR calc Af Amer 37 (*)    All other components within normal limits  URINALYSIS, ROUTINE W REFLEX MICROSCOPIC - Abnormal; Notable for the following components:   APPearance HAZY (*)    Hgb  urine dipstick SMALL (*)    Ketones, ur 5 (*)    Protein, ur 30 (*)    All other components within normal limits  HEMOGLOBIN A1C - Abnormal; Notable for the following components:   Hgb A1c MFr Bld 6.6 (*)    All other components within normal limits  APTT  PROTIME-INR  TYPE AND SCREEN     IMAGES:  CT angio head, neck 07/22/17:  - Calcified plaque right carotid bifurcation narrowing the internal carotid artery lumen by 55% diameter stenosis. Tandem 40% diameter stenosis proximal right internal carotid artery. Severe stenosis origin right external carotid artery. Moderate stenosis right cavernous carotid. - Irregular calcified and noncalcified plaque proximal left internal carotid artery with severe stenosis, 80% diameter stenosis. Moderate stenosis left cavernous carotid - Severe stenosis origin of left vertebral artery and moderate stenosis origin of right vertebral artery - Diffuse intracranial atherosclerotic disease as above. Severe stenosis right M2 segment supplying acute infarct in the right parietal lobe.  MR brain 07/21/17:  1. Large area of acute ischemia within the posterior right hemisphere, involving the posterior right MCA territory and a small portion of  the right PCA territory. Involvement of 2 vascular territories may indicate an embolic source. 2. Petechial hemorrhage in the posterior right MCA distribution, but no space-occupying hematoma. No midline shift or other mass effect. 3. Old basal ganglia and cerebellar lacunar infarcts superimposed on chronic microvascular ischemia.   EKG 07/21/17: NSR. RBBB. LAFB. Bifascicular block. Inferior infarct, age undetermined - EKG done in the setting of ED during acute CVA.  - RBBB new since prior EKG and inferior infarct present on prior EKG 01/03/06.     CV:  Carotid duplex 07/24/17:  - Right Carotid: Velocities in the right ICA are consistent with a 40-59% stenosis, Mid range. The ECA appears >50% stenosed. - Left Carotid:  Velocities in the left ICA are consistent with a 80-99% stenosis. - Vertebrals: Bilateral vertebral arteries demonstrate antegrade flow.  Echo 07/22/17:  - Left ventricle: The cavity size was normal. Systolic function was normal. The estimated ejection fraction was in the range of 60% to 65%. Wall motion was normal; there were no regional wall motion abnormalities. Doppler parameters are consistent with abnormal left ventricular relaxation (grade 1 diastolic dysfunction).   Past Medical History:  Diagnosis Date  . Carotid stenosis, asymptomatic, left   . Diabetes mellitus without complication ([place])    Type II  . Hyperlipidemia   . Stroke [facility]) 07/21/2017    Past Surgical History:  Procedure Laterality Date  . FEMORAL-FEMORAL BYPASS GRAFT Bilateral 2007    MEDICATIONS: . acetaminophen (TYLENOL) 325 MG tablet  . aspirin EC 325 MG EC tablet  . benazepril (LOTENSIN) 5 MG tablet  . clopidogrel (PLAVIX) 75 MG tablet  . ezetimibe (ZETIA) 10 MG tablet  . glucose blood (ONETOUCH VERIO) test strip  . glucose blood (ONETOUCH VERIO) test strip  . insulin degludec (TRESIBA FLEXTOUCH) 100 UNIT/ML SOPN FlexTouch Pen  . liraglutide (VICTOZA) 18 MG/3ML SOPN  . metFORMIN (GLUCOPHAGE-XR) 750 MG 24 hr tablet  . methylphenidate (RITALIN) 5 MG tablet  . metoprolol succinate (TOPROL-XL) 50 MG 24 hr tablet  . RELION PEN NEEDLES 32G X 4 MM MISC  . rosuvastatin (CRESTOR) 40 MG tablet   No current facility-administered medications for this encounter.    - Pt to stay on ASA and plavix perioperatively   If labs acceptable day of surgery, I anticipate pt can proceed with surgery as scheduled.  Willeen Cass, FNP-BC [facility]/Anesthesiology Phone: [phone] 11/05/2017 12:36 PM

## 2017-11-07 DIAGNOSIS — I639 Cerebral infarction, unspecified: Secondary | ICD-10-CM | POA: Diagnosis not present

## 2017-11-11 ENCOUNTER — Encounter: Payer: Self-pay | Admitting: Occupational Therapy

## 2017-11-11 ENCOUNTER — Ambulatory Visit: Payer: Medicare HMO | Admitting: Occupational Therapy

## 2017-11-11 ENCOUNTER — Ambulatory Visit: Payer: Medicare HMO

## 2017-11-11 DIAGNOSIS — I69354 Hemiplegia and hemiparesis following cerebral infarction affecting left non-dominant side: Secondary | ICD-10-CM | POA: Diagnosis not present

## 2017-11-11 DIAGNOSIS — R29818 Other symptoms and signs involving the nervous system: Secondary | ICD-10-CM

## 2017-11-11 DIAGNOSIS — I69318 Other symptoms and signs involving cognitive functions following cerebral infarction: Secondary | ICD-10-CM

## 2017-11-11 DIAGNOSIS — R41842 Visuospatial deficit: Secondary | ICD-10-CM

## 2017-11-11 DIAGNOSIS — R2681 Unsteadiness on feet: Secondary | ICD-10-CM

## 2017-11-11 DIAGNOSIS — R414 Neurologic neglect syndrome: Secondary | ICD-10-CM | POA: Diagnosis not present

## 2017-11-11 DIAGNOSIS — R278 Other lack of coordination: Secondary | ICD-10-CM

## 2017-11-11 DIAGNOSIS — R482 Apraxia: Secondary | ICD-10-CM | POA: Diagnosis not present

## 2017-11-11 DIAGNOSIS — R208 Other disturbances of skin sensation: Secondary | ICD-10-CM

## 2017-11-11 DIAGNOSIS — R2689 Other abnormalities of gait and mobility: Secondary | ICD-10-CM | POA: Diagnosis not present

## 2017-11-11 NOTE — Anesthesia Preprocedure Evaluation (Addendum)
Anesthesia Evaluation  Patient identified by MRN, date of birth, ID band Patient awake    Reviewed: Allergy & Precautions, NPO status , Patient's Chart, lab work & pertinent test results, reviewed documented beta blocker date and time   Airway Mallampati: II  TM Distance: >3 FB Neck ROM: Full    Dental  (+) Loose, Missing,    Pulmonary former smoker,    Pulmonary exam normal breath sounds clear to auscultation       Cardiovascular hypertension, Pt. on medications and Pt. on home beta blockers Normal cardiovascular exam Rhythm:Regular Rate:Normal  ECG: LAFB, RBBB, Bifascicular block  ECHO: Left ventricle: The cavity size was normal. Systolic function was normal. The estimated ejection fraction was in the range of 60% to 65%. Wall motion was normal; there were no regional wall motion abnormalities. Doppler parameters are consistent with abnormal left ventricular relaxation (grade 1 diastolic dysfunction).  Irregular calcified and noncalcified plaque proximal left internal carotid artery with severe stenosis, 80% diameter stenosis. Moderate stenosis left cavernous carotid   Neuro/Psych CVA (left-sided weakness), Residual Symptoms negative psych ROS   GI/Hepatic negative GI ROS, Neg liver ROS,   Endo/Other  diabetes, Insulin Dependent, Oral Hypoglycemic Agents  Renal/GU Renal disease     Musculoskeletal negative musculoskeletal ROS (+)   Abdominal   Peds  Hematology  (+) anemia , HLD   Anesthesia Other Findings LEFT CAROTID STENOSIS  Reproductive/Obstetrics                            Anesthesia Physical Anesthesia Plan  ASA: IV  Anesthesia Plan: General   Post-op Pain Management:    Induction: Intravenous  PONV Risk Score and Plan: 2 and Ondansetron, Dexamethasone and Treatment may vary due to age or medical condition  Airway Management Planned: Oral ETT  Additional Equipment: Arterial  line  Intra-op Plan:   Post-operative Plan: Extubation in OR  Informed Consent: I have reviewed the patients History and Physical, chart, labs and discussed the procedure including the risks, benefits and alternatives for the proposed anesthesia with the patient or authorized representative who has indicated his/her understanding and acceptance.   Dental advisory given  Plan Discussed with: CRNA  Anesthesia Plan Comments:         Anesthesia Quick Evaluation

## 2017-11-11 NOTE — Therapy (Signed)
[facility] [address] [place], [place], [address] Phone: [phone]   Fax:  [phone]  Physical Therapy Treatment  Patient Details  Name: Eric Long MRN: [id number] Date of Birth: 1940/12/25 Referring Provider: Dr. Letta Pate   Encounter Date: 11/11/2017  PT End of Session - 11/11/17 1144    Visit Number  18    Number of Visits  18    Date for PT Re-Evaluation  11/10/17    Authorization Type  [employer]    PT Start Time  1102    PT Stop Time  1134    PT Time Calculation (min)  32 min    Equipment Utilized During Treatment  -- min guard to S    Activity Tolerance  Patient tolerated treatment well    Behavior During Therapy  [facility] for tasks assessed/performed       Past Medical History:  Diagnosis Date  . Carotid stenosis, asymptomatic, left   . Diabetes mellitus without complication ([place])    Type II  . Hyperlipidemia   . Stroke [facility]) 07/21/2017    Past Surgical History:  Procedure Laterality Date  . FEMORAL-FEMORAL BYPASS GRAFT Bilateral 2007    There were no vitals filed for this visit.  Subjective Assessment - 11/11/17 1104    Subjective  Pt denied falls or changes since last visit.     Patient is accompained by:  -- wife in lobby, then PT brought her back    Pertinent History  DM, HLD, PVD, CKD (stage III)    Patient Stated Goals  Return to IND living, walk without concern, build up stamina     Currently in Pain?  No/denies         Therex and Neuro re-ed: Pt performed all activities in standing, seated, and hooklying. Cues and demo for technique for safety. No incr. In pain during session. PT also educated pt's wife on HEP and d/c. Please see pt instructions for HEP details.                         PT Education - 11/11/17 1143    Education provided  Yes    Education Details  PT reviewed and discussed the importance of performing HEP after d/c. PT reviewed with pt  and wife and reiterated to the pt's wife that pt will need assist to ensure safety and proper technique.     Person(s) Educated  Patient;Spouse    Methods  Explanation;Demonstration;Tactile cues;Verbal cues;Handout    Comprehension  Returned demonstration;Verbalized understanding;Need further instruction       PT Short Term Goals - 09/23/17 1544      PT SHORT TERM GOAL #1   Title  Pt will perform HEP with S to improve balance, endurance, strength, and safety. TARGET DATE FOR ALL STGS: 09/09/17    Status  Achieved      PT SHORT TERM GOAL #2   Title  Pt will perform TUG with LRAD, with S,  in </=25 sec. to decr. falls risk    Baseline  36.37 secs with RW, 19.65 secs without device min/guard A for safety.     Status  Achieved      PT SHORT TERM GOAL #3   Title  Pt will improve gait speed with LRAD to >/=1.31f/sec. to decr. falls risk.     Baseline  2.04 ft/sec with RW, 1.66 ft/sec without device    Status  Achieved  PT SHORT TERM GOAL #4   Title  Perform BERG and write STG and LTG as indicated.     Status  Achieved      PT SHORT TERM GOAL #5   Title  Pt will amb. 300' over even terrain with LRAD and S to improve safety during functional mobility.     Baseline  not met in clinic on 09/22/17, min/guard with max cues for walker negotiation.     Status  Not Met      PT SHORT TERM GOAL #6   Title  Pt will improve BERG score to >/=35/56 to decr. falls risk.     Baseline  37/56 on 09/23/17    Status  Partially Met        PT Long Term Goals - 11/11/17 1147      PT LONG TERM GOAL #1   Title  Pt will verbalize understanding of CVA risk factors and s/s to decr. risk of CVA. (TARGET DATE FOR ALL LTGS: 10/07/17) all unmet goals will be carried over to new POC: 11/06/17    Status  Achieved      PT LONG TERM GOAL #2   Title  Pt will be IND with HEP to improve strength, safety, balance, and endurance.     Status  Partially Met      PT LONG TERM GOAL #3   Title  Pt will perform TUG with  LRAD, at MOD I level, in </=13.5 sec. to decr. falls risk.     Status  Achieved      PT LONG TERM GOAL #4   Title  Pt will improve gait speed to >/=2.45f/sec. with LRAD to amb. safely in the community.     Status  Achieved      PT LONG TERM GOAL #5   Title  Pt will amb. 600' over uneven terrain with LRAD at MOD I level to improve functional mobility and safety.     Status  Partially Met      PT LONG TERM GOAL #6   Title  Pt will improve BERG score to >/=49/56 to decr. falls risk.     Baseline  48/56 on 11/05/17    Status  Partially Met            Plan - 11/11/17 1145    Clinical Impression Statement  Pt partially met LTG 2, as he required cues and demo for technique. PT provided pt and wife with education regarding HEP and discussed goal progress and d/c today. Please see d/c summary for details.     Rehab Potential  Good    Clinical Impairments Affecting Rehab Potential  see above.     PT Frequency  2x / week    PT Duration  4 weeks    PT Treatment/Interventions  ADLs/Self Care Home Management;Biofeedback;Canalith Repostioning;Electrical Stimulation;Therapeutic exercise;Therapeutic activities;Manual techniques;Wheelchair mobility training;Vestibular;Functional mobility training;Orthotic Fit/Training;Stair training;Gait training;Patient/family education;Cognitive remediation;DME Instruction;Neuromuscular re-education;Balance training    PT Next Visit Plan  d/c    Consulted and Agree with Plan of Care  Patient;Family member/caregiver    Family Member Consulted  pt's wife (Eric Long),       Patient will benefit from skilled therapeutic intervention in order to improve the following deficits and impairments:  Abnormal gait, Decreased endurance, Impaired sensation, Postural dysfunction, Impaired flexibility, Impaired tone, Decreased coordination, Decreased safety awareness, Decreased cognition, Decreased range of motion, Dizziness, Decreased balance, Decreased mobility, Decreased activity  tolerance, Decreased knowledge of use of DME, Decreased strength, Impaired UE  functional use  Visit Diagnosis: Other abnormalities of gait and mobility  Unsteadiness on feet  Hemiplegia and hemiparesis following cerebral infarction affecting left non-dominant side [facility])     Problem List Patient Active Problem List   Diagnosis Date Noted  . Acute ischemic right MCA stroke ([place]) 07/26/2017  . Hyperlipidemia 07/23/2017  . Diastolic dysfunction   . Acute blood loss anemia   . Essential hypertension 07/22/2017  . Type 2 diabetes mellitus with renal manifestations ([place]) 07/22/2017  . Acute CVA (cerebrovascular accident) ([place]) 07/21/2017  . Chronic kidney disease, stage III (moderate) ([place]) 02/19/2013  . Atherosclerotic peripheral vascular disease ([place]) 02/19/2013  . Pure hypercholesterolemia 02/19/2013  . Diabetes mellitus, stable ([place]) 02/05/2013    Hagen Tidd L 11/11/2017, 11:54 AM  [place] [address] [place], [place], [address] Phone: [phone]   Fax:  [phone]  Name: MERIC JOYE MRN: [id number] Date of Birth: 1940/10/14  PHYSICAL THERAPY DISCHARGE SUMMARY  Visits from Start of Care: 18  Current functional level related to goals / functional outcomes: PT Long Term Goals - 11/11/17 1147      PT LONG TERM GOAL #1   Title  Pt will verbalize understanding of CVA risk factors and s/s to decr. risk of CVA. (TARGET DATE FOR ALL LTGS: 10/07/17) all unmet goals will be carried over to new POC: 11/06/17    Status  Achieved      PT LONG TERM GOAL #2   Title  Pt will be IND with HEP to improve strength, safety, balance, and endurance.     Status  Partially Met      PT LONG TERM GOAL #3   Title  Pt will perform TUG with LRAD, at MOD I level, in </=13.5 sec. to decr. falls risk.     Status  Achieved      PT LONG TERM GOAL #4   Title  Pt will improve gait speed to >/=2.68f/sec. with LRAD to amb.  safely in the community.     Status  Achieved      PT LONG TERM GOAL #5   Title  Pt will amb. 600' over uneven terrain with LRAD at MOD I level to improve functional mobility and safety.     Status  Partially Met      PT LONG TERM GOAL #6   Title  Pt will improve BERG score to >/=49/56 to decr. falls risk.     Baseline  48/56 on 11/05/17    Status  Partially Met         Remaining deficits: Impaired cognition and decr. Safety. Awareness. Pt has maximized functional gains in PT at this time. Please refer pt back to PT if status changes.   Education / Equipment: HEP, RW as [facility]  Plan: Patient agrees to discharge.  Patient goals were partially met. Patient is being discharged due to lack of progress.  ?????         JGeoffry Paradise PT,DPT 11/11/17 11:57 AM Phone: [phone]: [phone]

## 2017-11-11 NOTE — Patient Instructions (Signed)
Gastroc Stretch    Holding onto counter: Stand with LEFT  foot back, leg straight, forward leg bent. Keeping heel on floor, turned slightly out, lean into wall until stretch is felt in calf. Hold _30___ seconds. Repeat __3__ times per set. Do __1__ sets per session. Do _2-3___ sessions per day.  http://orth.exer.us/26   Copyright  VHI. All rights reserved.   Gastroc / Heel Cord Stretch - Seated With Towel    Seated in chair, with Left heel on floor, towel around ball of foot. Gently pull foot in toward body, stretching heel cord and calf. Hold for _30__ seconds. Repeat __3_ times. Do _2-3__ times per day.  Copyright  VHI. All rights reserved.   Perform in corner with chair in front of you OR at kitchen sink with chair behind you for safety: perform with someone there with you.  Feet Apart, Head Motion - Eyes Open    Hold chair as needed.With eyes open, feet apart, move head slowly: up and down5 times and side to side 5 times. Repeat __3__ times per session. Do __1__ sessions per day.  Copyright  VHI. All rights reserved. Feet Apart, Varied Arm Positions - Eyes Closed    Stand with feet shoulder width apart and armsat your side or holding chair as needed. Close eyes and visualize upright position. Hold __30__ seconds. Repeat __3__ times per session. Do __1__ sessions per day.  Copyright  VHI. All rights reserved. Feet Together, Varied Arm Positions - Eyes Open    With eyes open, feet together, armsat your side and holding counter as needed, look straight ahead at a stationary object. Hold __30__ seconds. Repeat __3__ times per session. Do __1__ sessions per day.  Copyright  VHI. All rights reserved. Single Leg - Eyes Open    Holding support, lift right leg while maintaining balance over other leg. Progress to removing hands from support surface for longer periods of time. Hold_10-30___ seconds. Repeat with other leg, might need to block left leg to  ensure safety. Repeat __3__ times per sessionper leg. Do __1__ sessions per day.       Copyright  VHI. All rights reserved.  Knee High    Do this forward along the counter top at home and hold counter with one hand. Have Eric Long beside you to help. March slowly forward, turn when you get to the end and march to the other end. Do 4 laps down and back.  Repeat __1__ times. Do __1-2__ sessions per day.  http://gt2.exer.us/767  Copyright  VHI. All rights reserved.  HIP: Abduction - Standing    Squeeze glutes. Raise leg out and slightly back.Stay tall and keep feet pointed forward towards counter. Stay tall.  __10_ reps per set, _1-2__ sets per day, _5-7__ days per week Hold onto a support.  Copyright  VHI. All rights reserved.  Piriformis Stretch, Sitting    Sit, one ankle on opposite knee, same-side hand on crossed knee. Push down on knee, keeping spine straight. Lean torso forward, with flat back, until tension is felt in hamstrings and gluteals of crossed-leg side. Hold _30__ seconds.  Repeat _3__ times per session. Do _1-2__ sessions per day.  Copyright  VHI. All rights reserved.  HIP: Hamstrings - Short Sitting    Rest leg on raised surface. Keep knee straight. Lift chest and bend forward at the hips. Keep looking straight ahead. Hold _30__ seconds. __3_ reps per set, __1-2_ sets per day, __7_ days per week.  Copyright  VHI. All rights reserved.  Lower Trunk Rotation Stretch    Keeping back flat and feet together, rotate knees to left side. Hold _30___ seconds.Repeat to the other side. Repeat __3__ times per set. Do __1__ sets per session. Do _1-2___ sessions per day.  http://orth.exer.us/122  Bridge    Lie back, legs bent.Tuck in stomach, lift hips up towards ceiling. Hold for 2 seconds, then slowly lower back down. Repeat __10__ times.Do 3 sets per session.Do __3__ sessions  perweek.  http://pm.exer.us/54  Copyright  VHI. All rights reserved  Sit to Stand: Head Upright    With head upright, stand up slowly with eyes open.Keep left foot straight (family can sit beside you and help your foot maintain position). Try to stand without using hands. Lean forward and sit slowly.  Repeat _10___ times per session. Do _2___ sessions per day.  Copyright  VHI. All rights reserved.

## 2017-11-11 NOTE — Therapy (Signed)
[facility] [address] [place], [place], [address] Phone: [phone]   Fax:  [phone]  Occupational Therapy Evaluation  Patient Details  Name: Eric Long MRN: [id number] Date of Birth: 1940/11/27 Referring Provider: Dr. Letta Pate   Encounter Date: 11/11/2017  OT End of Session - 11/11/17 1232    Visit Number  17    Number of Visits  29    Date for OT Re-Evaluation  12/04/17    Authorization Type   medicare     Authorization Time Period  90 days    OT Start Time  1146    OT Stop Time  1229    OT Time Calculation (min)  43 min    Activity Tolerance  Patient tolerated treatment well       Past Medical History:  Diagnosis Date  . Carotid stenosis, asymptomatic, left   . Diabetes mellitus without complication ([place])    Type II  . Hyperlipidemia   . Stroke [facility]) 07/21/2017    Past Surgical History:  Procedure Laterality Date  . FEMORAL-FEMORAL BYPASS GRAFT Bilateral 2007    There were no vitals filed for this visit.  Subjective Assessment - 11/11/17 1151    Subjective   I have been adding the cane during my PT (PT has expressly told pt/wife he is not safe with quad cane and should use walker outside - when questioned pt agreed that he has been told this).      Patient is accompained by:  Family member wife    Pertinent History  R MCA CVA, acute encephalopathy, severe L carotid stenosis.      Patient Stated Goals  back to where I am stable with walking, use the computer, continue reading and writing.     Currently in Pain?  No/denies                  OT Treatments/Exercises (OP) - 11/11/17 0001      ADLs   Cooking  Addressed making cold sandwich and drink followed by clean up. Pt with significant improvement today in navigating the kitchen, finding items on left, positioning himself at midline for activities, and improved basic organization and sequencing. Pt required min vc's for  initiation and also needed cues to organize dishwashing when drain was full of clean dishes.  Feel pt's performance varies from day to day and extensive education has been completed with pt's wife and son.  Pt with improved rate of processing and initiation since starting on medication at low dose - encouraged pt and wife to discuss possibly increasing to more therapeutic level when pt sees physiatrist this month. Pt and wife both verbalized understanding.      ADL Comments  Also addressed table top scanning at 57M level with 95% accuracy.                OT Short Term Goals - 11/11/17 1231      OT SHORT TERM GOAL #1   Title  Pt and family will be mod I for home activities program for L field cut/L neglect - 09/09/2017    Status  Achieved      OT SHORT TERM GOAL #2   Title  Assessed 9 hole peg and determine goal if appropriate.    Status  Achieved see PN      OT SHORT TERM GOAL #3   Title  Assess Box and blocks and determine goal if appropriate    Status  Achieved L= 38      OT SHORT TERM GOAL #4   Title  Pt will be supevision for UB dressing     Status  Achieved      OT SHORT TERM GOAL #5   Title  Pt will need no more than 3 vc's for clothing orientation when dressing.    Status  Achieved      OT SHORT TERM GOAL #6   Title  Pt will be min a, mod cues for LB dressing    Status  Achieved      OT SHORT TERM GOAL #7   Title  Pt will locate items during functional tasks on L with no more than min cues     Status  Achieved      OT SHORT TERM GOAL #8   Title  Pt will be supervision for simple bev and snack prep - 11/06/2017    Status  Achieved        OT Long Term Goals - 11/11/17 1231      OT LONG TERM GOAL #1   Title  Pt and family will be mod I upgraded HEP prn - 12/04/2017    Status  Achieved      OT LONG TERM GOAL #2   Title  Pt will demonstrate envrionmental scanning during functional task with at least 90% accuracy    Status  Partially Met activity dependent and  varies from day to day      OT LONG TERM GOAL #3   Title  Pt will be able to complete bilateral tasks with no more than min cues to attend to L hand    Status  Achieved      OT LONG TERM GOAL #4   Title  Pt will be mod I with eating    Status  Achieved      OT LONG TERM GOAL #5   Title  Pt will be supervision for simple yard tasks.    Status  Deferred at pts request      OT LONG TERM GOAL #6   Title  Pt will be supervision with dressing    Status  Achieved      OT LONG TERM GOAL #7   Title  Pt will be mod I with toilet and shower transfers    Status  Achieved distant supervsion for shower transfers      OT LONG TERM GOAL #8   Title  Pt will be able to read simple paragraph with compensations and min cueing    Status  Achieved            Plan - 11/11/17 1232    Clinical Impression Statement  Pt has met all goals.  Pt is ready for d/c and pt and wife in agreement.     Occupational Profile and client history currently impacting functional performance  PMH:  DM, HTN, HLD, chronic kidney disease, PVD, severe L extracranial carotid stenosis.      Occupational performance deficits (Please refer to evaluation for details):  ADL's;IADL's;Rest and Sleep;Leisure;Social Participation    Rehab Potential  Fair    Current Impairments/barriers affecting progress:  signficant perceptual deficits, apraxia, cognitive deficits    OT Frequency  2x / week    OT Duration  8 weeks    OT Treatment/Interventions  Self-care/ADL training;Aquatic Therapy;Electrical Stimulation;DME and/or AE instruction;Neuromuscular education;Therapeutic exercise;Functional Mobility Training;Manual Therapy;Therapeutic activities;Cognitive remediation/compensation;Balance training;Patient/family education;Visual/perceptual remediation/compensation    Plan  d/c from OT  Consulted and Agree with Plan of Care  Patient;Family member/caregiver    Family Member Consulted  wife        Patient will benefit from skilled  therapeutic intervention in order to improve the following deficits and impairments:  Abnormal gait, Decreased activity tolerance, Decreased balance, Decreased coordination, Decreased cognition, Decreased knowledge of use of DME, Decreased mobility, Decreased safety awareness, Difficulty walking, Impaired UE functional use, Impaired sensation, Impaired vision/preception  Visit Diagnosis: Unsteadiness on feet  Hemiplegia and hemiparesis following cerebral infarction affecting left non-dominant side ([facility])  Other lack of coordination  Left-sided neglect  Visuospatial deficit  Other symptoms and signs involving cognitive functions following cerebral infarction  Other disturbances of skin sensation  Apraxia  Other symptoms and signs involving the nervous system    Problem List Patient Active Problem List   Diagnosis Date Noted  . Acute ischemic right MCA stroke ([place]) 07/26/2017  . Hyperlipidemia 07/23/2017  . Diastolic dysfunction   . Acute blood loss anemia   . Essential hypertension 07/22/2017  . Type 2 diabetes mellitus with renal manifestations ([place]) 07/22/2017  . Acute CVA (cerebrovascular accident) ([place]) 07/21/2017  . Chronic kidney disease, stage III (moderate) ([place]) 02/19/2013  . Atherosclerotic peripheral vascular disease ([place]) 02/19/2013  . Pure hypercholesterolemia 02/19/2013  . Diabetes mellitus, stable ([place]) 02/05/2013   OCCUPATIONAL THERAPY DISCHARGE SUMMARY  Visits from Start of Care: 17  Current functional level related to goals / functional outcomes: See above   Remaining deficits: Impaired cognition, L neglect, decreased balance   Education / Equipment: HEP, extensive education with wife and adult children  Plan: Patient agrees to discharge.  Patient goals were met. Patient is being discharged due to meeting the stated rehab goals.  ?????      Quay Burow, OTR/L 11/11/2017, 12:34 PM  [facility] [address] [place], [place], [address] Phone: [phone]   Fax:  [phone]  Name: Eric Long MRN: [id number] Date of Birth: [birth year]

## 2017-11-12 ENCOUNTER — Inpatient Hospital Stay (HOSPITAL_COMMUNITY): Payer: Medicare HMO | Admitting: Emergency Medicine

## 2017-11-12 ENCOUNTER — Encounter (HOSPITAL_COMMUNITY): Payer: Self-pay | Admitting: *Deleted

## 2017-11-12 ENCOUNTER — Other Ambulatory Visit: Payer: Self-pay

## 2017-11-12 ENCOUNTER — Encounter (HOSPITAL_COMMUNITY)
Admission: RE | Disposition: A | Discharge status: Home / Self Care | Payer: Self-pay | Source: Ambulatory Visit | Attending: Vascular Surgery

## 2017-11-12 ENCOUNTER — Inpatient Hospital Stay (HOSPITAL_COMMUNITY)
Admission: RE | Admit: 2017-11-12 | Discharge: 2017-11-13 | DRG: 038 | Disposition: A | Discharge status: Home / Self Care | Payer: Medicare HMO | Source: Ambulatory Visit | Attending: Vascular Surgery | Admitting: Vascular Surgery

## 2017-11-12 DIAGNOSIS — I9789 Other postprocedural complications and disorders of the circulatory system, not elsewhere classified: Secondary | ICD-10-CM | POA: Diagnosis present

## 2017-11-12 DIAGNOSIS — E7800 Pure hypercholesterolemia, unspecified: Secondary | ICD-10-CM | POA: Diagnosis present

## 2017-11-12 DIAGNOSIS — Z833 Family history of diabetes mellitus: Secondary | ICD-10-CM | POA: Diagnosis not present

## 2017-11-12 DIAGNOSIS — D649 Anemia, unspecified: Secondary | ICD-10-CM | POA: Diagnosis not present

## 2017-11-12 DIAGNOSIS — I129 Hypertensive chronic kidney disease with stage 1 through stage 4 chronic kidney disease, or unspecified chronic kidney disease: Secondary | ICD-10-CM | POA: Diagnosis not present

## 2017-11-12 DIAGNOSIS — E1122 Type 2 diabetes mellitus with diabetic chronic kidney disease: Secondary | ICD-10-CM | POA: Diagnosis not present

## 2017-11-12 DIAGNOSIS — Z7982 Long term (current) use of aspirin: Secondary | ICD-10-CM | POA: Diagnosis not present

## 2017-11-12 DIAGNOSIS — N289 Disorder of kidney and ureter, unspecified: Secondary | ICD-10-CM | POA: Diagnosis not present

## 2017-11-12 DIAGNOSIS — N183 Chronic kidney disease, stage 3 (moderate): Secondary | ICD-10-CM | POA: Diagnosis present

## 2017-11-12 DIAGNOSIS — E785 Hyperlipidemia, unspecified: Secondary | ICD-10-CM | POA: Diagnosis present

## 2017-11-12 DIAGNOSIS — I69319 Unspecified symptoms and signs involving cognitive functions following cerebral infarction: Secondary | ICD-10-CM | POA: Diagnosis not present

## 2017-11-12 DIAGNOSIS — E1151 Type 2 diabetes mellitus with diabetic peripheral angiopathy without gangrene: Secondary | ICD-10-CM | POA: Diagnosis present

## 2017-11-12 DIAGNOSIS — T81718A Complication of other artery following a procedure, not elsewhere classified, initial encounter: Secondary | ICD-10-CM | POA: Diagnosis not present

## 2017-11-12 DIAGNOSIS — I509 Heart failure, unspecified: Secondary | ICD-10-CM | POA: Diagnosis not present

## 2017-11-12 DIAGNOSIS — I69354 Hemiplegia and hemiparesis following cerebral infarction affecting left non-dominant side: Secondary | ICD-10-CM

## 2017-11-12 DIAGNOSIS — Z7902 Long term (current) use of antithrombotics/antiplatelets: Secondary | ICD-10-CM | POA: Diagnosis not present

## 2017-11-12 DIAGNOSIS — I130 Hypertensive heart and chronic kidney disease with heart failure and stage 1 through stage 4 chronic kidney disease, or unspecified chronic kidney disease: Secondary | ICD-10-CM | POA: Diagnosis not present

## 2017-11-12 DIAGNOSIS — Z87891 Personal history of nicotine dependence: Secondary | ICD-10-CM

## 2017-11-12 DIAGNOSIS — I2510 Atherosclerotic heart disease of native coronary artery without angina pectoris: Secondary | ICD-10-CM | POA: Diagnosis not present

## 2017-11-12 DIAGNOSIS — Z794 Long term (current) use of insulin: Secondary | ICD-10-CM

## 2017-11-12 DIAGNOSIS — Z79899 Other long term (current) drug therapy: Secondary | ICD-10-CM | POA: Diagnosis not present

## 2017-11-12 DIAGNOSIS — Z8673 Personal history of transient ischemic attack (TIA), and cerebral infarction without residual deficits: Secondary | ICD-10-CM | POA: Diagnosis not present

## 2017-11-12 DIAGNOSIS — I6522 Occlusion and stenosis of left carotid artery: Principal | ICD-10-CM | POA: Diagnosis present

## 2017-11-12 DIAGNOSIS — I6529 Occlusion and stenosis of unspecified carotid artery: Secondary | ICD-10-CM | POA: Diagnosis present

## 2017-11-12 DIAGNOSIS — R42 Dizziness and giddiness: Secondary | ICD-10-CM | POA: Diagnosis not present

## 2017-11-12 HISTORY — PX: CAROTID ENDARTERECTOMY: SUR193

## 2017-11-12 HISTORY — PX: PATCH ANGIOPLASTY: SHX6230

## 2017-11-12 HISTORY — PX: ENDARTERECTOMY: SHX5162

## 2017-11-12 HISTORY — DX: Pneumonia, unspecified organism: J18.9

## 2017-11-12 HISTORY — DX: Type 2 diabetes mellitus without complications: E11.9

## 2017-11-12 LAB — BASIC METABOLIC PANEL
Anion gap: 12 (ref 5–15)
BUN: 25 mg/dL — AB (ref 8–23)
CHLORIDE: 101 mmol/L (ref 98–111)
CO2: 24 mmol/L (ref 22–32)
Calcium: 9.2 mg/dL (ref 8.9–10.3)
Creatinine, Ser: 2.23 mg/dL — ABNORMAL HIGH (ref 0.61–1.24)
GFR calc Af Amer: 31 mL/min — ABNORMAL LOW (ref >60)
GFR calc non Af Amer: 26 mL/min — ABNORMAL LOW (ref >60)
GLUCOSE: 112 mg/dL — AB (ref 70–99)
POTASSIUM: 4.3 mmol/L (ref 3.5–5.1)
Sodium: 137 mmol/L (ref 135–145)

## 2017-11-12 LAB — GLUCOSE, CAPILLARY
GLUCOSE-CAPILLARY: 117 mg/dL — AB (ref 70–99)
GLUCOSE-CAPILLARY: 184 mg/dL — AB (ref 70–99)
Glucose-Capillary: 104 mg/dL — ABNORMAL HIGH (ref 70–99)
Glucose-Capillary: 135 mg/dL — ABNORMAL HIGH (ref 70–99)

## 2017-11-12 SURGERY — ENDARTERECTOMY, CAROTID
Anesthesia: General | Site: Neck | Laterality: Left

## 2017-11-12 MED ORDER — METOPROLOL SUCCINATE ER 50 MG PO TB24
50.0000 mg | ORAL_TABLET | Freq: Every day | ORAL | Status: DC
  Administered 2017-11-13: 50 mg via ORAL
  Filled 2017-11-12: qty 1

## 2017-11-12 MED ORDER — METOPROLOL TARTRATE 5 MG/5ML IV SOLN: 2.0000 mg | INTRAVENOUS | Status: DC | PRN

## 2017-11-12 MED ORDER — INSULIN GLARGINE 100 UNIT/ML ~~LOC~~ SOLN
15.0000 [IU] | Freq: Every day | SUBCUTANEOUS | Status: DC
  Administered 2017-11-13: 15 [IU] via SUBCUTANEOUS
  Filled 2017-11-12: qty 0.15

## 2017-11-12 MED ORDER — SODIUM CHLORIDE 0.9 % IV SOLN
INTRAVENOUS | Status: AC
  Filled 2017-11-12: qty 1.2

## 2017-11-12 MED ORDER — CEFAZOLIN SODIUM-DEXTROSE 2-4 GM/100ML-% IV SOLN
2.0000 g | INTRAVENOUS | Status: AC
  Administered 2017-11-12: 2 g via INTRAVENOUS
  Filled 2017-11-12: qty 100

## 2017-11-12 MED ORDER — PHENOL 1.4 % MT LIQD: 1.0000 | OROMUCOSAL | Status: DC | PRN

## 2017-11-12 MED ORDER — PHENYLEPHRINE 40 MCG/ML (10ML) SYRINGE FOR IV PUSH (FOR BLOOD PRESSURE SUPPORT)
PREFILLED_SYRINGE | INTRAVENOUS | Status: AC
Start: 2017-11-12 — End: ?
  Filled 2017-11-12: qty 10

## 2017-11-12 MED ORDER — ESMOLOL HCL 100 MG/10ML IV SOLN
INTRAVENOUS | Status: AC
  Filled 2017-11-12: qty 10

## 2017-11-12 MED ORDER — CLOPIDOGREL BISULFATE 75 MG PO TABS
75.0000 mg | ORAL_TABLET | Freq: Every day | ORAL | Status: DC
  Administered 2017-11-13: 75 mg via ORAL
  Filled 2017-11-12: qty 1

## 2017-11-12 MED ORDER — LABETALOL HCL 5 MG/ML IV SOLN: 10.0000 mg | INTRAVENOUS | Status: DC | PRN

## 2017-11-12 MED ORDER — CHLORHEXIDINE GLUCONATE 4 % EX LIQD: 60.0000 mL | Freq: Once | CUTANEOUS | Status: DC

## 2017-11-12 MED ORDER — PANTOPRAZOLE SODIUM 40 MG PO TBEC
40.0000 mg | DELAYED_RELEASE_TABLET | Freq: Every day | ORAL | Status: DC
  Administered 2017-11-13: 40 mg via ORAL
  Filled 2017-11-12: qty 1

## 2017-11-12 MED ORDER — INSULIN DEGLUDEC 100 UNIT/ML ~~LOC~~ SOPN: 15.0000 [IU] | PEN_INJECTOR | Freq: Every morning | SUBCUTANEOUS | Status: DC

## 2017-11-12 MED ORDER — SUGAMMADEX SODIUM 200 MG/2ML IV SOLN
INTRAVENOUS | Status: DC | PRN
  Administered 2017-11-12: 200 mg via INTRAVENOUS

## 2017-11-12 MED ORDER — ROCURONIUM BROMIDE 100 MG/10ML IV SOLN
INTRAVENOUS | Status: DC | PRN
  Administered 2017-11-12: 40 mg via INTRAVENOUS
  Administered 2017-11-12: 20 mg via INTRAVENOUS

## 2017-11-12 MED ORDER — DOCUSATE SODIUM 100 MG PO CAPS
100.0000 mg | ORAL_CAPSULE | Freq: Every day | ORAL | Status: DC
  Administered 2017-11-13: 100 mg via ORAL
  Filled 2017-11-12: qty 1

## 2017-11-12 MED ORDER — SODIUM CHLORIDE 0.9 % IV SOLN: 500.0000 mL | Freq: Once | INTRAVENOUS | Status: DC | PRN

## 2017-11-12 MED ORDER — PHENYLEPHRINE HCL 10 MG/ML IJ SOLN
INTRAMUSCULAR | Status: DC | PRN
  Administered 2017-11-12 (×2): 80 ug via INTRAVENOUS
  Administered 2017-11-12: 40 ug via INTRAVENOUS

## 2017-11-12 MED ORDER — PROPOFOL 10 MG/ML IV BOLUS
INTRAVENOUS | Status: AC
  Filled 2017-11-12: qty 20

## 2017-11-12 MED ORDER — PROTAMINE SULFATE 10 MG/ML IV SOLN
INTRAVENOUS | Status: AC
  Filled 2017-11-12: qty 5

## 2017-11-12 MED ORDER — DEXAMETHASONE SODIUM PHOSPHATE 4 MG/ML IJ SOLN
INTRAMUSCULAR | Status: DC | PRN
  Administered 2017-11-12: 4 mg via INTRAVENOUS

## 2017-11-12 MED ORDER — FENTANYL CITRATE (PF) 100 MCG/2ML IJ SOLN: 25.0000 ug | INTRAMUSCULAR | Status: DC | PRN

## 2017-11-12 MED ORDER — LIDOCAINE HCL 1 % IJ SOLN
INTRAMUSCULAR | Status: AC
  Filled 2017-11-12: qty 20

## 2017-11-12 MED ORDER — FENTANYL CITRATE (PF) 100 MCG/2ML IJ SOLN
INTRAMUSCULAR | Status: DC | PRN
  Administered 2017-11-12 (×2): 50 ug via INTRAVENOUS
  Administered 2017-11-12: 200 ug via INTRAVENOUS

## 2017-11-12 MED ORDER — DEXAMETHASONE SODIUM PHOSPHATE 10 MG/ML IJ SOLN
INTRAMUSCULAR | Status: AC
  Filled 2017-11-12: qty 1

## 2017-11-12 MED ORDER — CEFAZOLIN SODIUM-DEXTROSE 2-4 GM/100ML-% IV SOLN
2.0000 g | Freq: Three times a day (TID) | INTRAVENOUS | Status: AC
  Administered 2017-11-12: 2 g via INTRAVENOUS
  Filled 2017-11-12: qty 100

## 2017-11-12 MED ORDER — GUAIFENESIN-DM 100-10 MG/5ML PO SYRP: 15.0000 mL | ORAL_SOLUTION | ORAL | Status: DC | PRN

## 2017-11-12 MED ORDER — SODIUM CHLORIDE 0.9 % IV SOLN: INTRAVENOUS | Status: DC

## 2017-11-12 MED ORDER — ACETAMINOPHEN 325 MG RE SUPP: 325.0000 mg | RECTAL | Status: DC | PRN

## 2017-11-12 MED ORDER — POTASSIUM CHLORIDE CRYS ER 20 MEQ PO TBCR: 20.0000 meq | EXTENDED_RELEASE_TABLET | Freq: Every day | ORAL | Status: DC | PRN

## 2017-11-12 MED ORDER — ROCURONIUM BROMIDE 10 MG/ML (PF) SYRINGE
PREFILLED_SYRINGE | INTRAVENOUS | Status: AC
  Filled 2017-11-12: qty 10

## 2017-11-12 MED ORDER — FENTANYL CITRATE (PF) 250 MCG/5ML IJ SOLN
INTRAMUSCULAR | Status: AC
Start: 2017-11-12 — End: ?
  Filled 2017-11-12: qty 5

## 2017-11-12 MED ORDER — ONDANSETRON HCL 4 MG/2ML IJ SOLN
INTRAMUSCULAR | Status: DC | PRN
  Administered 2017-11-12: 4 mg via INTRAVENOUS

## 2017-11-12 MED ORDER — PROTAMINE SULFATE 10 MG/ML IV SOLN
INTRAVENOUS | Status: DC | PRN
  Administered 2017-11-12 (×2): 50 mg via INTRAVENOUS

## 2017-11-12 MED ORDER — OXYCODONE-ACETAMINOPHEN 5-325 MG PO TABS: 1.0000 | ORAL_TABLET | ORAL | Status: DC | PRN

## 2017-11-12 MED ORDER — MORPHINE SULFATE (PF) 4 MG/ML IV SOLN: 4.0000 mg | INTRAVENOUS | Status: DC | PRN

## 2017-11-12 MED ORDER — LIDOCAINE 2% (20 MG/ML) 5 ML SYRINGE
INTRAMUSCULAR | Status: AC
  Filled 2017-11-12: qty 5

## 2017-11-12 MED ORDER — PROPOFOL 10 MG/ML IV BOLUS
INTRAVENOUS | Status: DC | PRN
  Administered 2017-11-12: 50 mg via INTRAVENOUS
  Administered 2017-11-12: 100 mg via INTRAVENOUS
  Administered 2017-11-12: 50 mg via INTRAVENOUS

## 2017-11-12 MED ORDER — EZETIMIBE 10 MG PO TABS
10.0000 mg | ORAL_TABLET | Freq: Every day | ORAL | Status: DC
  Administered 2017-11-12 – 2017-11-13 (×2): 10 mg via ORAL
  Filled 2017-11-12 (×2): qty 1

## 2017-11-12 MED ORDER — FENTANYL CITRATE (PF) 250 MCG/5ML IJ SOLN
INTRAMUSCULAR | Status: AC
  Filled 2017-11-12: qty 5

## 2017-11-12 MED ORDER — MAGNESIUM SULFATE 2 GM/50ML IV SOLN: 2.0000 g | Freq: Every day | INTRAVENOUS | Status: DC | PRN

## 2017-11-12 MED ORDER — DEXTROSE 5 % IV SOLN
INTRAVENOUS | Status: DC | PRN
  Administered 2017-11-12: 20 ug/min via INTRAVENOUS

## 2017-11-12 MED ORDER — HEPARIN SODIUM (PORCINE) 1000 UNIT/ML IJ SOLN
INTRAMUSCULAR | Status: DC | PRN
  Administered 2017-11-12: 9000 [IU] via INTRAVENOUS

## 2017-11-12 MED ORDER — ASPIRIN EC 325 MG PO TBEC
325.0000 mg | DELAYED_RELEASE_TABLET | Freq: Every day | ORAL | Status: DC
  Administered 2017-11-13: 325 mg via ORAL
  Filled 2017-11-12: qty 1

## 2017-11-12 MED ORDER — EPHEDRINE SULFATE 50 MG/ML IJ SOLN
INTRAMUSCULAR | Status: AC
  Filled 2017-11-12: qty 1

## 2017-11-12 MED ORDER — ONDANSETRON HCL 4 MG/2ML IJ SOLN: 4.0000 mg | Freq: Once | INTRAMUSCULAR | Status: DC | PRN

## 2017-11-12 MED ORDER — ONDANSETRON HCL 4 MG/2ML IJ SOLN: 4.0000 mg | Freq: Four times a day (QID) | INTRAMUSCULAR | Status: DC | PRN

## 2017-11-12 MED ORDER — INSULIN ASPART 100 UNIT/ML ~~LOC~~ SOLN
0.0000 [IU] | Freq: Three times a day (TID) | SUBCUTANEOUS | Status: DC
  Administered 2017-11-12: 2 [IU] via SUBCUTANEOUS
  Administered 2017-11-13: 3 [IU] via SUBCUTANEOUS

## 2017-11-12 MED ORDER — ONDANSETRON HCL 4 MG/2ML IJ SOLN
INTRAMUSCULAR | Status: AC
Start: 2017-11-12 — End: ?
  Filled 2017-11-12: qty 2

## 2017-11-12 MED ORDER — ALUM & MAG HYDROXIDE-SIMETH 200-200-20 MG/5ML PO SUSP: 15.0000 mL | ORAL | Status: DC | PRN

## 2017-11-12 MED ORDER — POLYETHYLENE GLYCOL 3350 17 G PO PACK: 17.0000 g | PACK | Freq: Every day | ORAL | Status: DC | PRN

## 2017-11-12 MED ORDER — LIDOCAINE HCL (CARDIAC) PF 100 MG/5ML IV SOSY
PREFILLED_SYRINGE | INTRAVENOUS | Status: DC | PRN
  Administered 2017-11-12: 70 mg via INTRAVENOUS
  Administered 2017-11-12: 30 mg via INTRAVENOUS

## 2017-11-12 MED ORDER — 0.9 % SODIUM CHLORIDE (POUR BTL) OPTIME
TOPICAL | Status: DC | PRN
  Administered 2017-11-12: 2000 mL

## 2017-11-12 MED ORDER — ESMOLOL HCL 100 MG/10ML IV SOLN
INTRAVENOUS | Status: DC | PRN
  Administered 2017-11-12: 20 mg via INTRAVENOUS
  Administered 2017-11-12 (×2): 30 mg via INTRAVENOUS
  Administered 2017-11-12: 20 mg via INTRAVENOUS

## 2017-11-12 MED ORDER — ENSURE ENLIVE PO LIQD
237.0000 mL | Freq: Two times a day (BID) | ORAL | Status: DC
  Administered 2017-11-13: 237 mL via ORAL

## 2017-11-12 MED ORDER — ONDANSETRON HCL 4 MG/2ML IJ SOLN
INTRAMUSCULAR | Status: AC
  Filled 2017-11-12: qty 2

## 2017-11-12 MED ORDER — LACTATED RINGERS IV SOLN
INTRAVENOUS | Status: DC | PRN
  Administered 2017-11-12: 08:00:00 via INTRAVENOUS

## 2017-11-12 MED ORDER — SODIUM CHLORIDE 0.9 % IV SOLN
INTRAVENOUS | Status: DC | PRN
  Administered 2017-11-12: 500 mL

## 2017-11-12 MED ORDER — PROTAMINE SULFATE 10 MG/ML IV SOLN
INTRAVENOUS | Status: AC
  Filled 2017-11-12: qty 10

## 2017-11-12 MED ORDER — ACETAMINOPHEN 325 MG PO TABS
325.0000 mg | ORAL_TABLET | ORAL | Status: DC | PRN
  Administered 2017-11-12: 325 mg via ORAL
  Filled 2017-11-12: qty 2

## 2017-11-12 MED ORDER — EPHEDRINE SULFATE 50 MG/ML IJ SOLN
INTRAMUSCULAR | Status: DC | PRN
  Administered 2017-11-12: 5 mg via INTRAVENOUS

## 2017-11-12 MED ORDER — ROSUVASTATIN CALCIUM 10 MG PO TABS
40.0000 mg | ORAL_TABLET | Freq: Every day | ORAL | Status: DC
  Administered 2017-11-12 – 2017-11-13 (×2): 40 mg via ORAL
  Filled 2017-11-12 (×3): qty 4

## 2017-11-12 MED ORDER — HYDRALAZINE HCL 20 MG/ML IJ SOLN: 5.0000 mg | INTRAMUSCULAR | Status: DC | PRN

## 2017-11-12 SURGICAL SUPPLY — 47 items
ADH SKN CLS APL DERMABOND .7 (GAUZE/BANDAGES/DRESSINGS) ×1
CANISTER SUCT 3000ML PPV (MISCELLANEOUS) ×2 IMPLANT
CANNULA VESSEL 3MM 2 BLNT TIP (CANNULA) ×4 IMPLANT
CATH ROBINSON RED A/P 18FR (CATHETERS) ×2 IMPLANT
CLIP LIGATING EXTRA MED SLVR (CLIP) ×2 IMPLANT
CLIP LIGATING EXTRA SM BLUE (MISCELLANEOUS) ×2 IMPLANT
CRADLE DONUT ADULT HEAD (MISCELLANEOUS) ×2 IMPLANT
DECANTER SPIKE VIAL GLASS SM (MISCELLANEOUS) IMPLANT
DERMABOND ADVANCED (GAUZE/BANDAGES/DRESSINGS) ×1
DERMABOND ADVANCED .7 DNX12 (GAUZE/BANDAGES/DRESSINGS) ×1 IMPLANT
DRAIN HEMOVAC 1/8 X 5 (WOUND CARE) IMPLANT
ELECT REM PT RETURN 9FT ADLT (ELECTROSURGICAL) ×2
ELECTRODE REM PT RTRN 9FT ADLT (ELECTROSURGICAL) ×1 IMPLANT
EVACUATOR SILICONE 100CC (DRAIN) IMPLANT
GLOVE BIO SURGEON STRL SZ 6 (GLOVE) ×1 IMPLANT
GLOVE BIO SURGEON STRL SZ7.5 (GLOVE) ×1 IMPLANT
GLOVE BIOGEL M SZ8.5 STRL (GLOVE) ×1 IMPLANT
GLOVE BIOGEL PI IND STRL 6 (GLOVE) IMPLANT
GLOVE BIOGEL PI IND STRL 7.0 (GLOVE) IMPLANT
GLOVE BIOGEL PI INDICATOR 6 (GLOVE) ×1
GLOVE BIOGEL PI INDICATOR 7.0 (GLOVE) ×1
GLOVE SS BIOGEL STRL SZ 7.5 (GLOVE) ×1 IMPLANT
GLOVE SUPERSENSE BIOGEL SZ 7.5 (GLOVE) ×1
GOWN STRL REUS W/ TWL LRG LVL3 (GOWN DISPOSABLE) ×3 IMPLANT
GOWN STRL REUS W/ TWL XL LVL3 (GOWN DISPOSABLE) IMPLANT
GOWN STRL REUS W/TWL LRG LVL3 (GOWN DISPOSABLE) ×6
GOWN STRL REUS W/TWL XL LVL3 (GOWN DISPOSABLE) ×2
KIT BASIN OR (CUSTOM PROCEDURE TRAY) ×2 IMPLANT
KIT SHUNT ARGYLE CAROTID ART 6 (VASCULAR PRODUCTS) IMPLANT
KIT TURNOVER KIT B (KITS) ×2 IMPLANT
NEEDLE 22X1 1/2 (OR ONLY) (NEEDLE) IMPLANT
NS IRRIG 1000ML POUR BTL (IV SOLUTION) ×4 IMPLANT
PACK CAROTID (CUSTOM PROCEDURE TRAY) ×2 IMPLANT
PAD ARMBOARD 7.5X6 YLW CONV (MISCELLANEOUS) ×4 IMPLANT
PATCH HEMASHIELD 8X75 (Vascular Products) ×1 IMPLANT
SHUNT CAROTID BYPASS 10 (VASCULAR PRODUCTS) ×1 IMPLANT
SHUNT CAROTID BYPASS 12FRX15.5 (VASCULAR PRODUCTS) IMPLANT
SUT ETHILON 3 0 PS 1 (SUTURE) IMPLANT
SUT PROLENE 6 0 CC (SUTURE) ×4 IMPLANT
SUT SILK 3 0 (SUTURE)
SUT SILK 3-0 18XBRD TIE 12 (SUTURE) IMPLANT
SUT VIC AB 3-0 SH 27 (SUTURE) ×2
SUT VIC AB 3-0 SH 27X BRD (SUTURE) ×2 IMPLANT
SUT VICRYL 4-0 PS2 18IN ABS (SUTURE) ×2 IMPLANT
SYR CONTROL 10ML LL (SYRINGE) IMPLANT
TOWEL GREEN STERILE (TOWEL DISPOSABLE) ×2 IMPLANT
WATER STERILE IRR 1000ML POUR (IV SOLUTION) ×2 IMPLANT

## 2017-11-12 NOTE — Anesthesia Postprocedure Evaluation (Signed)
Anesthesia Post Note  Patient: JERETT ODONOHUE  Procedure(s) Performed: Left Carotid ENDARTERECTOMY (Left Neck) PATCH ANGIOPLASTY using Xenoxure patch (Left Neck)     Patient location during evaluation: PACU Anesthesia Type: General Level of consciousness: awake Pain management: pain level controlled Vital Signs Assessment: post-procedure vital signs reviewed and stable Respiratory status: spontaneous breathing, nonlabored ventilation, respiratory function stable and patient connected to nasal cannula oxygen Cardiovascular status: blood pressure returned to baseline and stable Postop Assessment: no apparent nausea or vomiting Anesthetic complications: no    Last Vitals:  Vitals:   11/12/17 1234 11/12/17 1241  BP:  (!) 104/48  Pulse:  72  Resp:  10  Temp: 36.6 C   SpO2:  94%    Last Pain:  Vitals:   11/12/17 1508  TempSrc:   PainSc: 3                  Ryan P Ellender

## 2017-11-12 NOTE — Transfer of Care (Signed)
Immediate Anesthesia Transfer of Care Note  Patient: Eric Long  Procedure(s) Performed: Left Carotid ENDARTERECTOMY (Left Neck) PATCH ANGIOPLASTY using Xenoxure patch (Left Neck)  Patient Location: PACU  Anesthesia Type:General  Level of Consciousness: drowsy, patient cooperative and responds to stimulation  Airway & Oxygen Therapy: Patient connected to face mask oxygen  Post-op Assessment: Report given to RN, Post -op Vital signs reviewed and stable, Patient moving all extremities X 4 and Patient able to stick tongue midline  Post vital signs: Reviewed and stable  Last Vitals:  Vitals Value Taken Time  BP    Temp    Pulse    Resp    SpO2      Last Pain:  Vitals:   11/12/17 0705  TempSrc: Oral  PainSc:          Complications: No apparent anesthesia complications

## 2017-11-12 NOTE — Anesthesia Procedure Notes (Signed)
Procedure Name: Intubation Performed by: Murvin Natal, MD Pre-anesthesia Checklist: Patient identified, Emergency Drugs available, Suction available and Patient being monitored Patient Re-evaluated:Patient Re-evaluated prior to induction Oxygen Delivery Method: Circle system utilized Preoxygenation: Pre-oxygenation with 100% oxygen Induction Type: IV induction Ventilation: Mask ventilation without difficulty and Oral airway inserted - appropriate to patient size Laryngoscope Size: Glidescope and 4 Grade View: Grade I Tube type: Oral Nasal Tubes: Right Tube size: 7.5 mm Airway Equipment and Method: Video-laryngoscopy and Stylet Placement Confirmation: ETT inserted through vocal cords under direct vision and positive ETCO2 Secured at: 24 cm Tube secured with: Tape Dental Injury: Teeth and Oropharynx as per pre-operative assessment  Difficulty Due To: Difficulty was unanticipated Comments: Grade 3 view with MAC 4 - switch to Glidescope resulting in grade 1 view and successful intubation

## 2017-11-12 NOTE — Anesthesia Procedure Notes (Signed)
Arterial Line Insertion Performed by: Gwyndolyn Saxon, CRNA, CRNA  Preanesthetic checklist: patient identified, IV checked, site marked, risks and benefits discussed, surgical consent, monitors and equipment checked, pre-op evaluation, timeout performed and anesthesia consent Lidocaine 1% used for infiltration Left, radial was placed Catheter size: 20 G Hand hygiene performed , maximum sterile barriers used  and Seldinger technique used Allen's test indicative of satisfactory collateral circulation Attempts: 1 Procedure performed without using ultrasound guided technique. Following insertion, dressing applied and Biopatch. Post procedure assessment: normal  Patient tolerated the procedure well with no immediate complications.

## 2017-11-12 NOTE — Op Note (Signed)
    OPERATIVE REPORT  DATE OF SURGERY: 11/12/2017  PATIENT: Eric Long, 77 y.o. male MRN: [id number]  DOB: 20-Oct-1940  PRE-OPERATIVE DIAGNOSIS: Severe asymptomatic left internal carotid artery stenosis  POST-OPERATIVE DIAGNOSIS:  Same  PROCEDURE: Left carotid endarterectomy and Dacron patch angioplasty  SURGEON:  Curt Jews, M.D.  PHYSICIAN ASSISTANT: Matt Eveland, PA-C  ANESTHESIA: General  EBL: Less than 100 ml  No intake/output data recorded.  BLOOD ADMINISTERED: None  DRAINS: None  SPECIMEN: None  COUNTS CORRECT:  YES  PLAN OF CARE: PACU neurologically intact  PATIENT DISPOSITION:  PACU - hemodynamically stable  PROCEDURE DETAILS: Patient was taken to the operating placed supine position where the area of the left next prepped and draped in usual sterile fashion.  Incision was made anterior sternocleidomastoid and carried down through the platysma with electrocautery.  Sternocleidomastoid reflected posteriorly and the carotid sheath was opened.  Facial vein was ligated with 2-0 silk ties and divided.  The common carotid artery was encircled with an umbilical tape and Rummel tourniquet.  The vagus and hypoglossal nerves were identified and preserved.  Dissection was continued onto the bifurcation.  The superior thyroid artery was encircled with a 2-0 silk Potts tie and the external carotid was encircled with a blue vessel loop.  The internal carotid was encircled with an umbilical tape and Rummel tourniquet.  Patient was given 9000 units of intravenous heparin.  After adequate circulation time the internal/external and common carotid arteries were occluded.  Common carotid artery was opened with an 11 blade and sent longitudinally with Potts scissors through the plaque onto the internal carotid.  A 10 shunt was passed up the internal carotid and allowed to backbleed and down the common carotid where secured with Rummel tourniquet's.  The endarterectomy was again on the  common carotid artery and the plaque was divided proximally with Potts scissors.  The endarterectomy scanted onto the bifurcation.  The external carotid was endarterectomized with an eversion technique and the internal carotid was endarterectomized in an open fashion.  Remaining atheromatous debris was removed from the endarterectomy.  A Finesse Hemashield Dacron patch was brought onto the field and was sewn as a patch angioplasty with a running 6-0 Prolene suture.  Prior to completion of the closure the shunt was removed and the anastomosis was completed after the usual flushing maneuvers.  Flow was restored first to the external and the internal carotid artery.  Excellent flow characteristics were noted with hand-held Doppler in the internal and external carotid arteries.  The patient was given 100 mg of protamine to reverse the heparin.  Wounds irrigated with saline.  Hemostasis to electrocautery.  Wounds were closed with 3-0 Vicryl in the subcutaneous and subcuticular tissue.  It was closed with a 4 sub-particular Vicryl suture.  Sterile dressing was applied and the patient was awakened in the operating room neurologically intact was transferred to the recovery room in stable   Rosetta Posner, M.D., [facility] 11/12/2017 11:14 AM

## 2017-11-12 NOTE — H&P (Signed)
Office Visit   09/16/2017 [facility] -Judge Stall, Arvilla Meres, MD    Vascular Surgery   Stenosis of left carotid artery    Dx   New Patient (Initial Visit)   ; Referred by Lawerance Cruel, MD    Reason for Visit     Additional Documentation   Vitals:   BP 131/77 (BP Location: Left Arm, Patient Position: Sitting, Cuff Size: Normal)    Pulse 96    Temp 98.1 F (36.7 C) (Oral)    Resp 16    Ht 5' 10.5" (1.791 m)    Wt 212 lb (96.2 kg)    SpO2 97%    BMI 29.99 kg/m    BSA 2.19 m    Flowsheets:   Vital Signs,    MEWS Score,    Anthropometrics      Encounter Info:   Billing Info,    History,    Allergies,    Detailed Report       All Notes    Progress Notes by Rosetta Posner, MD at 09/16/2017 2:15 PM   Author: Rosetta Posner, MD Author Type: Physician Filed: 09/16/2017 4:45 PM  Note Status: Signed Cosign: Cosign Not Required Encounter Date: 09/16/2017  Editor: Rosetta Posner, MD (Physician)                                        [facility]  Patient name: Eric Long        MRN: [id number]        DOB: 11/08/1940          Sex: male  REASON FOR CONSULT: Discuss severe asymptomatic left carotid stenosis  HPI: Eric Long is a 77 y.o. male, who is here today for discussion of recent imaging studies revealing high-grade left carotid stenosis.  He is here today with his daughter.  He had presented in mid March with a right brain stroke.  He was admitted to the hospital and eventually to rehabilitation for stroke recovery.  His work-up included a duplex suggesting critical stenosis in his left internal carotid artery with moderate right carotid stenosis.  CT angiogram confirmed this.  He has never had any left brain events.  He is right-handed.  He reports that he does have some cognitive changes and still has some left-sided weakness related to the stroke which is slowly improving.   He does have a distant history of aortobifemoral bypass in another city presumably related occlusive disease      Past Medical History:  Diagnosis Date  . Diabetes mellitus without complication ([place])   . Hyperlipidemia          Family History  Problem Relation Age of Onset  . Diabetes Sister     SOCIAL HISTORY: Social History        Socioeconomic History  . Marital status: Married    Spouse name: Not on file  . Number of children: Not on file  . Years of education: Not on file  . Highest education level: Not on file  Occupational History  . Not on file  Social Needs  . Financial resource strain: Not on file  . Food insecurity:    Worry: Not on file    Inability: Not on file  . Transportation needs:    Medical: Not on file  Non-medical: Not on file  Tobacco Use  . Smoking status: Former Research scientist (life sciences)  . Smokeless tobacco: Never Used  Substance and Sexual Activity  . Alcohol use: No    Frequency: Never  . Drug use: No  . Sexual activity: Not on file  Lifestyle  . Physical activity:    Days per week: Not on file    Minutes per session: Not on file  . Stress: Not on file  Relationships  . Social connections:    Talks on phone: Not on file    Gets together: Not on file    Attends religious service: Not on file    Active member of club or organization: Not on file    Attends meetings of clubs or organizations: Not on file    Relationship status: Not on file  . Intimate partner violence:    Fear of current or ex partner: Not on file    Emotionally abused: Not on file    Physically abused: Not on file    Forced sexual activity: Not on file  Other Topics Concern  . Not on file  Social History Narrative  . Not on file    No Known Allergies        Current Outpatient Medications  Medication Sig Dispense Refill  . acetaminophen (TYLENOL) 325 MG tablet Take 1-2 tablets (325-650 mg total) by mouth every 4 (four) hours  as needed for mild pain.    Marland Kitchen aspirin EC 325 MG EC tablet Take 1 tablet (325 mg total) by mouth daily. 30 tablet 0  . benazepril (LOTENSIN) 5 MG tablet TAKE 1 TABLET BY MOUTH ONCE DAILY 30 tablet 0  . clopidogrel (PLAVIX) 75 MG tablet TAKE 1 TABLET BY MOUTH ONCE DAILY 30 tablet 0  . ezetimibe (ZETIA) 10 MG tablet TAKE 1 TABLET BY MOUTH ONCE DAILY 30 tablet 0  . glucose blood (ONETOUCH VERIO) test strip Use to check blood sugar 2 times per day by rotation of meals. Dx code: E11.9 100 each 3  . glucose blood (ONETOUCH VERIO) test strip Use as instructed to check blood sugar 2 times daily. 100 each 12  . Insulin Degludec (TRESIBA FLEXTOUCH [place]) Inject 15 Units into the skin daily.     Marland Kitchen liraglutide (VICTOZA) 18 MG/3ML SOPN INJECT 0.3 MLS  (1.8 MG TOTAL) INTO THE SKIN ONCE A DAY 9 mL 3  . metoprolol succinate (TOPROL-XL) 50 MG 24 hr tablet TAKE 1 TABLET BY MOUTH ONCE DAILY AFTER  BREAKFAST 30 tablet 0  . RELION PEN NEEDLES 32G X 4 MM MISC USE ONE PEN NEEDLE TWICE DAILY 200 each 2  . rosuvastatin (CRESTOR) 40 MG tablet Take 1 tablet (40 mg total) by mouth daily. 30 tablet 0   No current facility-administered medications for this visit.     REVIEW OF SYSTEMS:  [X]  denotes positive finding, [ ]  denotes negative finding Cardiac  Comments:  Chest pain or chest pressure:    Shortness of breath upon exertion:    Short of breath when lying flat:    Irregular heart rhythm:        Vascular    Pain in calf, thigh, or hip brought on by ambulation: x   Pain in feet at night that wakes you up from your sleep:     Blood clot in your veins:    Leg swelling:  x       Pulmonary    Oxygen at home:    Productive cough:  Wheezing:         Neurologic    Sudden weakness in arms or legs:  x   Sudden numbness in arms or legs:     Sudden onset of difficulty speaking or slurred speech:    Temporary loss of vision in one eye:     Problems with dizziness:          Gastrointestinal    Blood in stool:     Vomited blood:         Genitourinary    Burning when urinating:     Blood in urine:        Psychiatric    Major depression:         Hematologic    Bleeding problems:    Problems with blood clotting too easily:        Skin    Rashes or ulcers:        Constitutional    Fever or chills:      PHYSICAL EXAM:    Vitals:   09/16/17 1432  BP: 131/77  Pulse: 96  Resp: 16  Temp: 98.1 F (36.7 C)  TempSrc: Oral  SpO2: 97%  Weight: 212 lb (96.2 kg)  Height: 5' 10.5" (1.791 m)    GENERAL: The patient is a well-nourished male, in no acute distress. The vital signs are documented above. CARDIOVASCULAR: Carotid arteries without bruits bilaterally.  2+ radial pulses bilaterally.  Heart regular rate and rhythm PULMONARY: There is good air exchange  ABDOMEN: Soft and non-tender  MUSCULOSKELETAL: There are no major deformities or cyanosis. NEUROLOGIC: Decreased strength in his left leg SKIN: There are no ulcers or rashes noted. PSYCHIATRIC: The patient has a normal affect.  DATA:  Reviewed his duplex and also his CT images.  This shows a extensive calcified plaque in his left internal carotid artery causing severe stenosis.  Moderate to 50% stenosis on the right carotid  MEDICAL ISSUES: Had a long discussion with the patient and his daughter present.  Splane the appropriate treatment for his right carotid is maximal medical treatment with no severe stenosis present.  I have recommended elective left endarterectomy for reduction of stroke risk.  I would allow for continued rehabilitation recovery from his stroke 2 months ago.  I did discuss symptoms of left carotid disease and they will notify us immediately should this occur.  Otherwise we will plan elective left carotid endarterectomy in mid July   Rosetta Posner, MD [facility]  [phone] Pager [phone]          Addendum:  The patient has been re-examined and re-evaluated.  The patient's history and physical has been reviewed and is unchanged.    Eric Long is a 77 y.o. male is being admitted with LEFT CAROTID STENOSIS. All the risks, benefits and other treatment options have been discussed with the patient. The patient has consented to proceed with Procedure(s): ENDARTERECTOMY CAROTID LEFT as a surgical intervention.  Reymundo Winship 11/12/2017 8:02 AM Vascular and Vein Surgery

## 2017-11-13 ENCOUNTER — Other Ambulatory Visit: Payer: Self-pay

## 2017-11-13 ENCOUNTER — Emergency Department (HOSPITAL_COMMUNITY)
Admission: EM | Admit: 2017-11-13 | Discharge: 2017-11-14 | Disposition: A | Discharge status: Home / Self Care | Payer: Medicare HMO | Attending: Emergency Medicine | Admitting: Emergency Medicine

## 2017-11-13 ENCOUNTER — Encounter (HOSPITAL_COMMUNITY): Payer: Self-pay | Admitting: Vascular Surgery

## 2017-11-13 ENCOUNTER — Telehealth: Payer: Self-pay | Admitting: Vascular Surgery

## 2017-11-13 DIAGNOSIS — Z794 Long term (current) use of insulin: Secondary | ICD-10-CM | POA: Diagnosis not present

## 2017-11-13 DIAGNOSIS — I2510 Atherosclerotic heart disease of native coronary artery without angina pectoris: Secondary | ICD-10-CM | POA: Insufficient documentation

## 2017-11-13 DIAGNOSIS — E1122 Type 2 diabetes mellitus with diabetic chronic kidney disease: Secondary | ICD-10-CM | POA: Diagnosis not present

## 2017-11-13 DIAGNOSIS — Z7982 Long term (current) use of aspirin: Secondary | ICD-10-CM | POA: Diagnosis not present

## 2017-11-13 DIAGNOSIS — N289 Disorder of kidney and ureter, unspecified: Secondary | ICD-10-CM | POA: Diagnosis not present

## 2017-11-13 DIAGNOSIS — I129 Hypertensive chronic kidney disease with stage 1 through stage 4 chronic kidney disease, or unspecified chronic kidney disease: Secondary | ICD-10-CM | POA: Insufficient documentation

## 2017-11-13 DIAGNOSIS — Z79899 Other long term (current) drug therapy: Secondary | ICD-10-CM | POA: Diagnosis not present

## 2017-11-13 DIAGNOSIS — I9789 Other postprocedural complications and disorders of the circulatory system, not elsewhere classified: Secondary | ICD-10-CM | POA: Diagnosis not present

## 2017-11-13 DIAGNOSIS — D649 Anemia, unspecified: Secondary | ICD-10-CM | POA: Diagnosis not present

## 2017-11-13 DIAGNOSIS — Z8673 Personal history of transient ischemic attack (TIA), and cerebral infarction without residual deficits: Secondary | ICD-10-CM | POA: Diagnosis not present

## 2017-11-13 DIAGNOSIS — T148XXA Other injury of unspecified body region, initial encounter: Secondary | ICD-10-CM

## 2017-11-13 DIAGNOSIS — N183 Chronic kidney disease, stage 3 (moderate): Secondary | ICD-10-CM | POA: Insufficient documentation

## 2017-11-13 DIAGNOSIS — Z87891 Personal history of nicotine dependence: Secondary | ICD-10-CM | POA: Insufficient documentation

## 2017-11-13 DIAGNOSIS — R42 Dizziness and giddiness: Secondary | ICD-10-CM | POA: Diagnosis not present

## 2017-11-13 DIAGNOSIS — T81718A Complication of other artery following a procedure, not elsewhere classified, initial encounter: Secondary | ICD-10-CM | POA: Diagnosis not present

## 2017-11-13 LAB — GLUCOSE, CAPILLARY: Glucose-Capillary: 152 mg/dL — ABNORMAL HIGH (ref 70–99)

## 2017-11-13 LAB — BASIC METABOLIC PANEL
ANION GAP: 9 (ref 5–15)
BUN: 28 mg/dL — ABNORMAL HIGH (ref 8–23)
CO2: 25 mmol/L (ref 22–32)
Calcium: 9 mg/dL (ref 8.9–10.3)
Chloride: 105 mmol/L (ref 98–111)
Creatinine, Ser: 2.2 mg/dL — ABNORMAL HIGH (ref 0.61–1.24)
GFR calc Af Amer: 31 mL/min — ABNORMAL LOW (ref >60)
GFR calc non Af Amer: 27 mL/min — ABNORMAL LOW (ref >60)
GLUCOSE: 107 mg/dL — AB (ref 70–99)
Potassium: 4.6 mmol/L (ref 3.5–5.1)
Sodium: 139 mmol/L (ref 135–145)

## 2017-11-13 LAB — CBC
HCT: 28.8 % — ABNORMAL LOW (ref 39.0–52.0)
Hemoglobin: 9.1 g/dL — ABNORMAL LOW (ref 13.0–17.0)
MCH: 29.4 pg (ref 26.0–34.0)
MCHC: 31.6 g/dL (ref 30.0–36.0)
MCV: 93.2 fL (ref 78.0–100.0)
Platelets: 210 K/uL (ref 150–400)
RBC: 3.09 MIL/uL — ABNORMAL LOW (ref 4.22–5.81)
RDW: 14.1 % (ref 11.5–15.5)
WBC: 11.6 K/uL — ABNORMAL HIGH (ref 4.0–10.5)

## 2017-11-13 MED ORDER — OXYCODONE-ACETAMINOPHEN 5-325 MG PO TABS: 1.0000 | ORAL_TABLET | Freq: Four times a day (QID) | ORAL | 0 refills | Status: AC | PRN

## 2017-11-13 NOTE — Progress Notes (Signed)
Paged Dagoberto Ligas PA-C about pt L neck incision oozing blood and the need to change drsg twice. Instructed this is okay due to plavix use and that it is still okay to d/c pt this AM.   Clyde Canterbury, RN

## 2017-11-13 NOTE — ED Triage Notes (Signed)
Patient here from home with possible arterial bleeding from endarterectomy site on left neck.  Patient is CAOx4.  Bleeding controlled in triage with bulky dressing.

## 2017-11-13 NOTE — Progress Notes (Signed)
Comfortable.  Up in his chair.  Neurologically at his baseline reports minimal discomfort Is having some skin edge issues from the skin incision related to Plavix.  No hematoma and minimal bruising. Will mobilize this morning and stable for discharge later today

## 2017-11-13 NOTE — Discharge Instructions (Signed)
   [facility] [place]  Discharge Instructions   Carotid Endarterectomy (CEA)  Please refer to the following instructions for your post-procedure care. Your surgeon or physician assistant will discuss any changes with you.  Activity  You are encouraged to walk as much as you can. You can slowly return to normal activities but must avoid strenuous activity and heavy lifting until your doctor tell you it's OK. Avoid activities such as vacuuming or swinging a golf club. You can drive after one week if you are comfortable and you are no longer taking prescription pain medications. It is normal to feel tired for serval weeks after your surgery. It is also normal to have difficulty with sleep habits, eating, and bowel movements after surgery. These will go away with time.  Bathing/Showering  You may shower after you come home. Do not soak in a bathtub, hot tub, or swim until the incision heals completely.  Incision Care  Shower every day. Clean your incision with mild soap and water. Pat the area dry with a clean towel. You do not need a bandage unless otherwise instructed. Do not apply any ointments or creams to your incision. You may have skin glue on your incision. Do not peel it off. It will come off on its own in about one week. Your incision may feel thickened and raised for several weeks after your surgery. This is normal and the skin will soften over time. For Men Only: It's OK to shave around the incision but do not shave the incision itself for 2 weeks. It is common to have numbness under your chin that could last for several months.  Diet  Resume your normal diet. There are no special food restrictions following this procedure. A low fat/low cholesterol diet is recommended for all patients with vascular disease. In order to heal from your surgery, it is CRITICAL to get adequate nutrition. Your body requires vitamins, minerals, and protein. Vegetables are the best  source of vitamins and minerals. Vegetables also provide the perfect balance of protein. Processed food has little nutritional value, so try to avoid this.        Medications  Resume taking all of your medications unless your doctor or physician assistant tells you not to. If your incision is causing pain, you may take over-the- counter pain relievers such as acetaminophen (Tylenol). If you were prescribed a stronger pain medication, please be aware these medications can cause nausea and constipation. Prevent nausea by taking the medication with a snack or meal. Avoid constipation by drinking plenty of fluids and eating foods with a high amount of fiber, such as fruits, vegetables, and grains. Do not take Tylenol if you are taking prescription pain medications.  Follow Up  Our office will schedule a follow up appointment 2-3 weeks following discharge.  Please call us immediately for any of the following conditions  Increased pain, redness, drainage (pus) from your incision site. Fever of 101 degrees or higher. If you should develop stroke (slurred speech, difficulty swallowing, weakness on one side of your body, loss of vision) you should call 911 and go to the nearest emergency room.  Reduce your risk of vascular disease:  Stop smoking. If you would like help call [facility] at 1-800-QUIT-NOW (1-800-784-8669) or [place] at 336-586-4000. Manage your cholesterol Maintain a desired weight Control your diabetes Keep your blood pressure down  If you have any questions, please call the office at [phone].   

## 2017-11-13 NOTE — Progress Notes (Signed)
D/c instructions given to pt and family. Wound care instructions and monitoring for a hematoma given. Prescription given. Iv removed, clean and intact. Telemetry removed. Wife to escort home.  Clyde Canterbury, RN

## 2017-11-13 NOTE — Telephone Encounter (Signed)
sch appt spk to pt wife 11/25/17 1130am p/o MD

## 2017-11-13 NOTE — Discharge Summary (Signed)
Discharge Summary     Eric Long 08-07-1940 77 y.o. male  [id number]  Admission Date: 11/12/2017  Discharge Date: 11/13/17  Physician: Rosetta Posner, MD  Admission Diagnosis: LEFT CAROTID STENOSIS  Discharge Day services:   See progress note 11/13/17  Physical Exam: Vitals:   11/13/17 0810 11/13/17 0837  BP: (!) 148/61   Pulse: 64 73  Resp: 18   Temp: 98.2 F (36.8 C)   SpO2: 98%    Hospital Course:  The patient was admitted to the hospital and taken to the operating room on 11/12/2017 and underwent left carotid endarterectomy.  The pt tolerated the procedure well and was transported to the PACU in good condition.     By POD 1, the pt neuro status remains at baseline.  L neck incision with some sanguinous oozing overnight.  This was again noted in am.  L neck remains soft despite drainage.  The remainder of the hospital course consisted of increasing mobilization and increasing intake of solids without difficulty.  He will follow up in office in about 2 weeks with Dr. Donnetta Hutching.  He will be prescribed 2-3 days of narcotic pain medication for continued post operative pain control.  Discharge instructions reviewed with the patient and he voiced his understanding.  Discharged this morning in stable condition.   Recent Labs    11/12/17 0700 11/13/17 0500  NA 137 139  K 4.3 4.6  CL 101 105  CO2 24 25  GLUCOSE 112* 107*  BUN 25* 28*  CALCIUM 9.2 9.0   Recent Labs    11/13/17 0500  WBC 11.6*  HGB 9.1*  HCT 28.8*  PLT 210   No results for input(s): INR in the last 72 hours.     Discharge Diagnosis:  LEFT CAROTID STENOSIS  Secondary Diagnosis: Patient Active Problem List   Diagnosis Date Noted  . Carotid artery stenosis 11/12/2017  . Acute ischemic right MCA stroke ([place]) 07/26/2017  . Hyperlipidemia 07/23/2017  . Diastolic dysfunction   . Acute blood loss anemia   . Essential hypertension 07/22/2017  . Type 2 diabetes mellitus with renal  manifestations ([place]) 07/22/2017  . Acute CVA (cerebrovascular accident) ([place]) 07/21/2017  . Chronic kidney disease, stage III (moderate) ([place]) 02/19/2013  . Atherosclerotic peripheral vascular disease ([place]) 02/19/2013  . Pure hypercholesterolemia 02/19/2013  . Diabetes mellitus, stable ([place]) 02/05/2013   Past Medical History:  Diagnosis Date  . Carotid stenosis, asymptomatic, left   . Hyperlipidemia   . Pneumonia    "twice" (11/12/2017)  . Stroke ([place]) 07/21/2017   "left side weaker since" (11/12/2017)  . Type II diabetes mellitus ([facility])     Allergies as of 11/13/2017   No Known Allergies     Medication List    TAKE these medications   acetaminophen 325 MG tablet Commonly known as:  TYLENOL Take 1-2 tablets (325-650 mg total) by mouth every 4 (four) hours as needed for mild pain.   aspirin 325 MG EC tablet Take 1 tablet (325 mg total) by mouth daily. What changed:  when to take this   benazepril 5 MG tablet Commonly known as:  LOTENSIN TAKE 1 TABLET BY MOUTH ONCE DAILY   clopidogrel 75 MG tablet Commonly known as:  PLAVIX TAKE 1 TABLET BY MOUTH ONCE DAILY   ezetimibe 10 MG tablet Commonly known as:  ZETIA TAKE 1 TABLET BY MOUTH ONCE DAILY   glucose blood test strip Commonly known as:  ONETOUCH VERIO Use to check blood sugar 2 times  per day by rotation of meals. Dx code: E11.9   glucose blood test strip Commonly known as:  ONETOUCH VERIO Use as instructed to check blood sugar 2 times daily.   liraglutide 18 MG/3ML Sopn Commonly known as:  VICTOZA INJECT 0.3 MLS  (1.8 MG TOTAL) INTO THE SKIN ONCE A DAY What changed:  additional instructions   metFORMIN 750 MG 24 hr tablet Commonly known as:  GLUCOPHAGE-XR Take 1 tablet (750 mg total) by mouth daily with breakfast.   methylphenidate 5 MG tablet Commonly known as:  RITALIN Take 1 tablet (5 mg total) by mouth 2 (two) times daily.   metoprolol succinate 50 MG 24 hr tablet Commonly known as:  TOPROL-XL TAKE 1  TABLET BY MOUTH ONCE DAILY AFTER  BREAKFAST   oxyCODONE-acetaminophen 5-325 MG tablet Commonly known as:  PERCOCET/ROXICET Take 1 tablet by mouth every 6 (six) hours as needed for moderate pain.   RELION PEN NEEDLES 32G X 4 MM Misc Generic drug:  Insulin Pen Needle USE ONE PEN NEEDLE TWICE DAILY   rosuvastatin 40 MG tablet Commonly known as:  CRESTOR Take 1 tablet (40 mg total) by mouth daily.   TRESIBA FLEXTOUCH 100 UNIT/ML Sopn FlexTouch Pen Generic drug:  insulin degludec Inject 15 Units into the skin every morning.        Discharge Instructions:   [facility] Discharge Instructions Carotid Endarterectomy (CEA)  Please refer to the following instructions for your post-procedure care. Your surgeon or physician assistant will discuss any changes with you.  Activity  You are encouraged to walk as much as you can. You can slowly return to normal activities but must avoid strenuous activity and heavy lifting until your doctor tell you it's OK. Avoid activities such as vacuuming or swinging a golf club. You can drive after one week if you are comfortable and you are no longer taking prescription pain medications. It is normal to feel tired for serval weeks after your surgery. It is also normal to have difficulty with sleep habits, eating, and bowel movements after surgery. These will go away with time.  Bathing/Showering  You may shower after you come home. Do not soak in a bathtub, hot tub, or swim until the incision heals completely.  Incision Care  Shower every day. Clean your incision with mild soap and water. Pat the area dry with a clean towel. You do not need a bandage unless otherwise instructed. Do not apply any ointments or creams to your incision. You may have skin glue on your incision. Do not peel it off. It will come off on its own in about one week. Your incision may feel thickened and raised for several weeks after your surgery.  This is normal and the skin will soften over time. For Men Only: It's OK to shave around the incision but do not shave the incision itself for 2 weeks. It is common to have numbness under your chin that could last for several months.  Diet  Resume your normal diet. There are no special food restrictions following this procedure. A low fat/low cholesterol diet is recommended for all patients with vascular disease. In order to heal from your surgery, it is CRITICAL to get adequate nutrition. Your body requires vitamins, minerals, and protein. Vegetables are the best source of vitamins and minerals. Vegetables also provide the perfect balance of protein. Processed food has little nutritional value, so try to avoid this.  Medications  Resume taking all of your medications  unless your doctor or physician assistant tells you not to.  If your incision is causing pain, you may take over-the- counter pain relievers such as acetaminophen (Tylenol). If you were prescribed a stronger pain medication, please be aware these medications can cause nausea and constipation.  Prevent nausea by taking the medication with a snack or meal. Avoid constipation by drinking plenty of fluids and eating foods with a high amount of fiber, such as fruits, vegetables, and grains. Do not take Tylenol if you are taking prescription pain medications.  Follow Up  Our office will schedule a follow up appointment 2-3 weeks following discharge.  Please call us immediately for any of the following conditions  Increased pain, redness, drainage (pus) from your incision site. Fever of 101 degrees or higher. If you should develop stroke (slurred speech, difficulty swallowing, weakness on one side of your body, loss of vision) you should call 911 and go to the nearest emergency room.  Reduce your risk of vascular disease:  Stop smoking. If you would like help call [facility] at 1-800-QUIT-NOW (980)148-0392) or [place] at  [phone]. Manage your cholesterol Maintain a desired weight Control your diabetes Keep your blood pressure down  If you have any questions, please call the office at [phone].  Disposition: home  Patient's condition: is Good  Follow up: 1. Dr. Donnetta Hutching in 2 weeks.   Dagoberto Ligas, PA-C [facility] [phone]   --- For [facility] Registry use ---   Modified Rankin score at D/C (0-6): 0  IV medication needed for:  1. Hypertension: No 2. Hypotension: No  Post-op Complications: No  1. Post-op CVA or TIA: No  If yes: Event classification (right eye, left eye, right cortical, left cortical, verterobasilar, other):   If yes: Timing of event (intra-op, <6 hrs post-op, >=6 hrs post-op, unknown):   2. CN injury: No  If yes: CN  injuried   3. Myocardial infarction: No  If yes: Dx by (EKG or clinical, Troponin):   4.  CHF: No  5.  Dysrhythmia (new): No  6. Wound infection: No  7. Reperfusion symptoms: No  8. Return to OR: No  If yes: return to OR for (bleeding, neurologic, other CEA incision, other):   Discharge medications: Statin use:  No ASA use:  Yes   Beta blocker use:  Yes ACE-Inhibitor use:  Yes  ARB use:  No CCB use: No P2Y12 Antagonist use: Yes, [x ] Plavix, [ ]  Plasugrel, [ ]  Ticlopinine, [ ]  Ticagrelor, [ ]  Other, [ ]  No for medical reason, [ ]  Non-compliant, [ ]  Not-indicated Anti-coagulant use:  No, [ ]  Warfarin, [ ]  Rivaroxaban, [ ]  Dabigatran,

## 2017-11-13 NOTE — ED Provider Notes (Signed)
Patient placed in Quick Look pathway, seen and evaluated   Chief Complaint: Post-op Bleeding  HPI:   Patient presenting with bleeding from his left carotid artery following surgery today.  Patient states that he was receiving wound care from home nurse who took his bandage off too quickly pulling the stitches out of his wound.  ROS: Neuro: +Lighheadness  Physical Exam:   Gen: No distress  Neuro: Awake and Alert  Skin: Warm    Focused Exam: Patient well-appearing with a large bandage to the left side of his neck.  I have advised nursing staff to move patient back to the acute side of the emergency department.  Patient is being moved back to trauma A.  Initiation of care has begun. The patient has been counseled on the process, plan, and necessity for staying for the completion/evaluation, and the remainder of the medical screening examination    Gari Crown 11/13/17 2344    Isla Pence, MD 11/18/17 [phone]

## 2017-11-14 ENCOUNTER — Telehealth: Payer: Self-pay | Admitting: *Deleted

## 2017-11-14 ENCOUNTER — Ambulatory Visit: Payer: Medicare HMO | Admitting: Rehabilitation

## 2017-11-14 DIAGNOSIS — I9789 Other postprocedural complications and disorders of the circulatory system, not elsewhere classified: Secondary | ICD-10-CM | POA: Diagnosis not present

## 2017-11-14 DIAGNOSIS — Z8673 Personal history of transient ischemic attack (TIA), and cerebral infarction without residual deficits: Secondary | ICD-10-CM | POA: Diagnosis not present

## 2017-11-14 DIAGNOSIS — Z7982 Long term (current) use of aspirin: Secondary | ICD-10-CM | POA: Diagnosis not present

## 2017-11-14 DIAGNOSIS — D649 Anemia, unspecified: Secondary | ICD-10-CM | POA: Diagnosis not present

## 2017-11-14 DIAGNOSIS — Z794 Long term (current) use of insulin: Secondary | ICD-10-CM | POA: Diagnosis not present

## 2017-11-14 DIAGNOSIS — Z79899 Other long term (current) drug therapy: Secondary | ICD-10-CM | POA: Diagnosis not present

## 2017-11-14 DIAGNOSIS — N183 Chronic kidney disease, stage 3 (moderate): Secondary | ICD-10-CM | POA: Diagnosis not present

## 2017-11-14 DIAGNOSIS — I2510 Atherosclerotic heart disease of native coronary artery without angina pectoris: Secondary | ICD-10-CM | POA: Diagnosis not present

## 2017-11-14 DIAGNOSIS — E1122 Type 2 diabetes mellitus with diabetic chronic kidney disease: Secondary | ICD-10-CM | POA: Diagnosis not present

## 2017-11-14 DIAGNOSIS — I129 Hypertensive chronic kidney disease with stage 1 through stage 4 chronic kidney disease, or unspecified chronic kidney disease: Secondary | ICD-10-CM | POA: Diagnosis not present

## 2017-11-14 LAB — CBC WITH DIFFERENTIAL/PLATELET
ABS IMMATURE GRANULOCYTES: 0.1 10*3/uL (ref 0.0–0.1)
BASOS ABS: 0.1 10*3/uL (ref 0.0–0.1)
BASOS PCT: 1 %
Eosinophils Absolute: 0.3 10*3/uL (ref 0.0–0.7)
Eosinophils Relative: 2 %
HCT: 33.2 % — ABNORMAL LOW (ref 39.0–52.0)
Hemoglobin: 10.2 g/dL — ABNORMAL LOW (ref 13.0–17.0)
IMMATURE GRANULOCYTES: 0 %
LYMPHS PCT: 13 %
Lymphs Abs: 1.8 10*3/uL (ref 0.7–4.0)
MCH: 29 pg (ref 26.0–34.0)
MCHC: 30.7 g/dL (ref 30.0–36.0)
MCV: 94.3 fL (ref 78.0–100.0)
MONOS PCT: 10 %
Monocytes Absolute: 1.3 10*3/uL — ABNORMAL HIGH (ref 0.1–1.0)
NEUTROS ABS: 10.3 10*3/uL — AB (ref 1.7–7.7)
NEUTROS PCT: 74 %
PLATELETS: 244 10*3/uL (ref 150–400)
RBC: 3.52 MIL/uL — ABNORMAL LOW (ref 4.22–5.81)
RDW: 14.3 % (ref 11.5–15.5)
WBC: 13.7 10*3/uL — ABNORMAL HIGH (ref 4.0–10.5)

## 2017-11-14 LAB — BASIC METABOLIC PANEL
ANION GAP: 11 (ref 5–15)
BUN: 29 mg/dL — ABNORMAL HIGH (ref 8–23)
CO2: 26 mmol/L (ref 22–32)
Calcium: 9.2 mg/dL (ref 8.9–10.3)
Chloride: 101 mmol/L (ref 98–111)
Creatinine, Ser: 2.34 mg/dL — ABNORMAL HIGH (ref 0.61–1.24)
GFR calc Af Amer: 29 mL/min — ABNORMAL LOW (ref >60)
GFR calc non Af Amer: 25 mL/min — ABNORMAL LOW (ref >60)
Glucose, Bld: 214 mg/dL — ABNORMAL HIGH (ref 70–99)
POTASSIUM: 4.5 mmol/L (ref 3.5–5.1)
SODIUM: 138 mmol/L (ref 135–145)

## 2017-11-14 LAB — PROTIME-INR
INR: 1.01
Prothrombin Time: 13.2 seconds (ref 11.4–15.2)

## 2017-11-14 LAB — APTT: aPTT: 32 seconds (ref 24–36)

## 2017-11-14 NOTE — Telephone Encounter (Signed)
Call from patient's wife "Yaniel does not look right"Horrible dark purple bruising on neck and under chin.  Spoke with patient and his wife. Patient denies any trouble breathing or swallowing. States no drainage or signs of infection at incision.Denies fever or chills.  Wife states area is a "little puffy but soft to touch" "Soreness" when he moves his head but has not taken any pain medication. Informed patient that due to history of Plavix and ASA that significant bruising is to be expected. However, if any trouble with breathing or swallowing, swelling in neck, to get help right away. Since surgery was just 2 days ago, suggested taking Tylenol for pain if patient does not want to take narcotic pain medication.

## 2017-11-14 NOTE — Discharge Instructions (Addendum)
Return if bleeding recurs. Otherwise, follow up with your vascular surgeon.

## 2017-11-14 NOTE — ED Notes (Signed)
Pt departed in NAD.  

## 2017-11-14 NOTE — ED Provider Notes (Signed)
[facility] EMERGENCY DEPARTMENT Provider Note   CSN: [id number] Arrival date & time: 11/13/17  2331     History   Chief Complaint Chief Complaint  Patient presents with  . Coagulation Disorder    HPI Eric Long is a 77 y.o. male.  The history is provided by the patient.  He has history of stroke, hypertension, diabetes, hyperlipidemia, carotid artery stenosis and had carotid endarterectomy earlier today.  He had some oozing from the site and home nurse had put a dressing on.  Blood had started oozing through the dressing so he came in for evaluation.  He denies any difficulty breathing or swallowing.  There is only mild pain at the site which he rates at 2/10.  He had been on aspirin and clopidogrel and had stopped those for 5 days prior to surgery, received doses of both today following the surgery.  He is not on any systemic anticoagulants.  Past Medical History:  Diagnosis Date  . Carotid stenosis, asymptomatic, left   . Hyperlipidemia   . Pneumonia    "twice" (11/12/2017)  . Stroke ([place]) 07/21/2017   "left side weaker since" (11/12/2017)  . Type II diabetes mellitus [facility])     Patient Active Problem List   Diagnosis Date Noted  . Carotid artery stenosis 11/12/2017  . Acute ischemic right MCA stroke ([place]) 07/26/2017  . Hyperlipidemia 07/23/2017  . Diastolic dysfunction   . Acute blood loss anemia   . Essential hypertension 07/22/2017  . Type 2 diabetes mellitus with renal manifestations ([place]) 07/22/2017  . Acute CVA (cerebrovascular accident) ([place]) 07/21/2017  . Chronic kidney disease, stage III (moderate) ([place]) 02/19/2013  . Atherosclerotic peripheral vascular disease ([place]) 02/19/2013  . Pure hypercholesterolemia 02/19/2013  . Diabetes mellitus, stable ([place]) 02/05/2013    Past Surgical History:  Procedure Laterality Date  . CAROTID ENDARTERECTOMY Left 11/12/2017  . ENDARTERECTOMY Left 11/12/2017   Procedure: Left Carotid ENDARTERECTOMY;   Surgeon: Rosetta Posner, MD;  Location: [place];  Service: Vascular;  Laterality: Left;  . FEMORAL-FEMORAL BYPASS GRAFT Bilateral 2007  . PATCH ANGIOPLASTY Left 11/12/2017   Procedure: PATCH ANGIOPLASTY using Xenoxure patch;  Surgeon: Rosetta Posner, MD;  Location: [facility] OR;  Service: Vascular;  Laterality: Left;        Home Medications    Prior to Admission medications   Medication Sig Start Date End Date Taking? Authorizing Provider  acetaminophen (TYLENOL) 325 MG tablet Take 1-2 tablets (325-650 mg total) by mouth every 4 (four) hours as needed for mild pain. 08/09/17   Love, Ivan Anchors, PA-C  aspirin EC 325 MG EC tablet Take 1 tablet (325 mg total) by mouth daily. Patient taking differently: Take 325 mg by mouth every evening.  07/26/17   Samuella Cota, MD  benazepril (LOTENSIN) 5 MG tablet TAKE 1 TABLET BY MOUTH ONCE DAILY 09/01/17   Kirsteins, Luanna Salk, MD  clopidogrel (PLAVIX) 75 MG tablet TAKE 1 TABLET BY MOUTH ONCE DAILY 09/01/17   Kirsteins, Luanna Salk, MD  ezetimibe (ZETIA) 10 MG tablet TAKE 1 TABLET BY MOUTH ONCE DAILY 09/01/17   Kirsteins, Luanna Salk, MD  glucose blood (ONETOUCH VERIO) test strip Use to check blood sugar 2 times per day by rotation of meals. Dx code: E11.9 Patient not taking: Reported on 10/31/2017 09/10/17   Elayne Snare, MD  glucose blood (ONETOUCH VERIO) test strip Use as instructed to check blood sugar 2 times daily. Patient not taking: Reported on 10/31/2017 09/10/17   Elayne Snare,  MD  insulin degludec (TRESIBA FLEXTOUCH) 100 UNIT/ML SOPN FlexTouch Pen Inject 15 Units into the skin every morning.     [provider]  liraglutide (VICTOZA) 18 MG/3ML SOPN INJECT 0.3 MLS  (1.8 MG TOTAL) INTO THE SKIN ONCE A DAY Patient taking differently: INJECT 0.3 MLS  (1.8 MG TOTAL) INTO THE SKIN ONCE A DAY IN THE [facility] 11/19/16   Elayne Snare, MD  metFORMIN (GLUCOPHAGE-XR) 750 MG 24 hr tablet Take 1 tablet (750 mg total) by mouth daily with breakfast. 10/30/17   Elayne Snare, MD    methylphenidate (RITALIN) 5 MG tablet Take 1 tablet (5 mg total) by mouth 2 (two) times daily. 10/17/17   Kirsteins, Luanna Salk, MD  metoprolol succinate (TOPROL-XL) 50 MG 24 hr tablet TAKE 1 TABLET BY MOUTH ONCE DAILY AFTER  BREAKFAST 09/01/17   Kirsteins, Luanna Salk, MD  oxyCODONE-acetaminophen (PERCOCET/ROXICET) 5-325 MG tablet Take 1 tablet by mouth every 6 (six) hours as needed for moderate pain. 11/13/17   Dagoberto Ligas, PA-C  RELION PEN NEEDLES 32G X 4 MM MISC USE ONE PEN NEEDLE TWICE DAILY Patient not taking: Reported on 10/31/2017 06/30/17   Elayne Snare, MD  rosuvastatin (CRESTOR) 40 MG tablet Take 1 tablet (40 mg total) by mouth daily. 08/09/17   Bary Leriche, PA-C    Family History Family History  Problem Relation Age of Onset  . Diabetes Sister     Social History Social History   Tobacco Use  . Smoking status: Former Smoker    Packs/day: 2.00    Years: 50.00    Pack years: 100.00    Types: Cigarettes    Last attempt to quit: 2007    Years since quitting: 12.5  . Smokeless tobacco: Never Used  Substance Use Topics  . Alcohol use: Never    Frequency: Never  . Drug use: Never     Allergies   Patient has no known allergies.   Review of Systems Review of Systems  All other systems reviewed and are negative.    Physical Exam Updated Vital Signs BP (!) 170/70 (BP Location: Right Arm)   Pulse 92   Temp 97.7 F (36.5 C) (Temporal)   Resp 16   Ht 5\' 10"  (1.778 m)   Wt 98.9 kg (218 lb)   SpO2 99%   BMI 31.28 kg/m   Physical Exam  Nursing note and vitals reviewed.  77 year old male, resting comfortably and in no acute distress. Vital signs are significant for elevated systolic blood pressure. Oxygen saturation is 99%, which is normal. Head is normocephalic and atraumatic. PERRLA, EOMI. Oropharynx is clear. Neck: Carotid endarterectomy scar is present on the left side.  Dressing is present which is bloody but not soaked. Back is nontender and there is no CVA  tenderness. Lungs are clear without rales, wheezes, or rhonchi. Chest is nontender. Heart has regular rate and rhythm without murmur. Abdomen is soft, flat, nontender without masses or hepatosplenomegaly and peristalsis is normoactive. Extremities have no cyanosis or edema, full range of motion is present. Skin is warm and dry without rash. Neurologic: Mental status is normal, cranial nerves are intact, there are no motor or sensory deficits.  ED Treatments / Results  Labs (all labs ordered are listed, but only abnormal results are displayed) Labs Reviewed  CBC WITH DIFFERENTIAL/PLATELET - Abnormal; Notable for the following components:      Result Value   WBC 13.7 (*)    RBC 3.52 (*)    Hemoglobin 10.2 (*)  HCT 33.2 (*)    Neutro Abs 10.3 (*)    Monocytes Absolute 1.3 (*)    All other components within normal limits  BASIC METABOLIC PANEL - Abnormal; Notable for the following components:   Glucose, Bld 214 (*)    BUN 29 (*)    Creatinine, Ser 2.34 (*)    GFR calc non Af Amer 25 (*)    GFR calc Af Amer 29 (*)    All other components within normal limits  PROTIME-INR  APTT   Procedures Procedures   Medications Ordered in ED Medications - No data to display   Initial Impression / Assessment and Plan / ED Course  I have reviewed the triage vital signs and the nursing notes.  Pertinent lab results that were available during my care of the patient were reviewed by me and considered in my medical decision making (see chart for details).  Postoperative bleeding left carotid endarterectomy.  Old records were reviewed confirming carotid endarterectomy having been done in the morning.  Dressing was removed from the site and a clot was noted in the middle portion of the scar.  Clot was removed which also involved removing some of the tissue adhesive which had been applied.  There is slight oozing noted and direct pressure is applied.  1:06 AM Oozing has stopped.  Hemoglobin is  stable, actually increased over baseline.  Mild renal insufficiency is present which is unchanged from baseline.  Dermabond is reapplied, and he is discharged with instructions for close follow-up with his vascular surgeon.  Final Clinical Impressions(s) / ED Diagnoses   Final diagnoses:  Bleeding from wound  Renal insufficiency  Normochromic normocytic anemia    ED Discharge Orders    None       Delora Fuel, MD 19/62/22 0134

## 2017-11-17 ENCOUNTER — Encounter: Payer: Medicare HMO | Admitting: Occupational Therapy

## 2017-11-17 ENCOUNTER — Ambulatory Visit: Payer: Medicare HMO | Admitting: Physical Therapy

## 2017-11-18 ENCOUNTER — Encounter: Payer: Medicare HMO | Admitting: Occupational Therapy

## 2017-11-18 ENCOUNTER — Ambulatory Visit: Payer: Medicare HMO

## 2017-11-20 ENCOUNTER — Encounter: Payer: Self-pay | Admitting: Physical Medicine & Rehabilitation

## 2017-11-20 ENCOUNTER — Other Ambulatory Visit: Payer: Self-pay

## 2017-11-20 ENCOUNTER — Ambulatory Visit (HOSPITAL_BASED_OUTPATIENT_CLINIC_OR_DEPARTMENT_OTHER): Payer: Medicare HMO | Admitting: Physical Medicine & Rehabilitation

## 2017-11-20 VITALS — BP 133/81 | HR 87 | Ht 70.5 in | Wt 207.0 lb

## 2017-11-20 DIAGNOSIS — Z87891 Personal history of nicotine dependence: Secondary | ICD-10-CM | POA: Diagnosis not present

## 2017-11-20 DIAGNOSIS — I69398 Other sequelae of cerebral infarction: Secondary | ICD-10-CM | POA: Diagnosis not present

## 2017-11-20 DIAGNOSIS — Z833 Family history of diabetes mellitus: Secondary | ICD-10-CM | POA: Insufficient documentation

## 2017-11-20 DIAGNOSIS — R414 Neurologic neglect syndrome: Secondary | ICD-10-CM | POA: Insufficient documentation

## 2017-11-20 DIAGNOSIS — E119 Type 2 diabetes mellitus without complications: Secondary | ICD-10-CM | POA: Diagnosis not present

## 2017-11-20 DIAGNOSIS — I69319 Unspecified symptoms and signs involving cognitive functions following cerebral infarction: Secondary | ICD-10-CM

## 2017-11-20 DIAGNOSIS — E785 Hyperlipidemia, unspecified: Secondary | ICD-10-CM | POA: Diagnosis not present

## 2017-11-20 DIAGNOSIS — Z95828 Presence of other vascular implants and grafts: Secondary | ICD-10-CM | POA: Insufficient documentation

## 2017-11-20 DIAGNOSIS — R269 Unspecified abnormalities of gait and mobility: Secondary | ICD-10-CM

## 2017-11-20 NOTE — Progress Notes (Signed)
Subjective:    Patient ID: Eric Long, male    DOB: 02/17/1941, 77 y.o.   MRN: [id number] 45-year-old right handed male with history of T2DM and hyperlipidemia who was admitted on 07/22/2017 with two day history of unsteady gait and left-sided weakness.  CT of head revealed hypodensity in parietal and temporal lobe consistent with acute/subacute infarct.  CTA head showed 40-55% R-ICA stenosis 80% L- ICA stenosis and severe stenosis left vertebral artery.  MRI of brain done showing large area of acute ischemia within posterior right hemisphere involving the right MCA  And small portion of right PCA question emboli.  Dr. Leonie Man felt right MCA infarct was due to intracranial and extracranial last vessel extraocular atherosclerosis and dual antiplatelet was recommended. HPI  Interval medical history underwent carotid endarterectomy on the left side by Dr. Donnetta Hutching 11/12/2017 Palpation every day with sensation of warmth started when taking ritalin, also had exaggerated startle response.  Wife cannot tell much difference in terms of his day-to-day awareness and cognition on the Ritalin.  OT notes have been reviewed.  Some improved attention to task and range of processing has been noted.  Patient is no longer receiving PT OT services.  Pain Inventory Average Pain 0 Pain Right Now 0 My pain is no pain  In the last 24 hours, has pain interfered with the following? General activity no pain Relation with others no pain Enjoyment of life no pain What TIME of day is your pain at its worst? no pain Sleep (in general) Poor  Pain is worse with: no pain Pain improves with: no pain Relief from Meds: no pain no pain  Mobility use a cane use a walker how many minutes can you walk? 5 ability to climb steps?  yes do you drive?  no transfers alone  Function retired  Neuro/Psych No problems in this area  Prior Studies Any changes since last visit?  no  Physicians involved in your care Any changes  since last visit?  yes   Family History  Problem Relation Age of Onset  . Diabetes Sister    Social History   Socioeconomic History  . Marital status: Married    Spouse name: Not on file  . Number of children: Not on file  . Years of education: Not on file  . Highest education level: Not on file  Occupational History  . Not on file  Social Needs  . Financial resource strain: Not on file  . Food insecurity:    Worry: Not on file    Inability: Not on file  . Transportation needs:    Medical: Not on file    Non-medical: Not on file  Tobacco Use  . Smoking status: Former Smoker    Packs/day: 2.00    Years: 50.00    Pack years: 100.00    Types: Cigarettes    Last attempt to quit: 2007    Years since quitting: 12.5  . Smokeless tobacco: Never Used  Substance and Sexual Activity  . Alcohol use: Never    Frequency: Never  . Drug use: Never  . Sexual activity: Yes  Lifestyle  . Physical activity:    Days per week: Not on file    Minutes per session: Not on file  . Stress: Not on file  Relationships  . Social connections:    Talks on phone: Not on file    Gets together: Not on file    Attends religious service: Not on file  Active member of club or organization: Not on file    Attends meetings of clubs or organizations: Not on file    Relationship status: Not on file  Other Topics Concern  . Not on file  Social History Narrative  . Not on file   Past Surgical History:  Procedure Laterality Date  . CAROTID ENDARTERECTOMY Left 11/12/2017  . ENDARTERECTOMY Left 11/12/2017   Procedure: Left Carotid ENDARTERECTOMY;  Surgeon: Rosetta Posner, MD;  Location: [facility];  Service: Vascular;  Laterality: Left;  . FEMORAL-FEMORAL BYPASS GRAFT Bilateral 2007  . PATCH ANGIOPLASTY Left 11/12/2017   Procedure: PATCH ANGIOPLASTY using Xenoxure patch;  Surgeon: Rosetta Posner, MD;  Location[facility];  Service: Vascular;  Laterality: Left;   Past Medical History:  Diagnosis Date  .  Carotid stenosis, asymptomatic, left   . Hyperlipidemia   . Pneumonia    "twice" (11/12/2017)  . Stroke ([place]) 07/21/2017   "left side weaker since" (11/12/2017)  . Type II diabetes mellitus (HCC)    BP 133/81   Pulse 87   Ht 5' 10.5" (1.791 m)   Wt 207 lb (93.9 kg)   SpO2 96%   BMI 29.28 kg/m   Opioid Risk Score:   Fall Risk Score:  `1  Depression screen PHQ 2/9  Depression screen [facility] 2/9 11/20/2017 08/18/2017 10/21/2016 07/11/2014  Decreased Interest 0 3 0 0  Down, Depressed, Hopeless 0 1 0 0  PHQ - 2 Score 0 4 0 0  Altered sleeping - 1 - -  Tired, decreased energy - 1 - -  Change in appetite - 1 - -  Feeling bad or failure about yourself  - 0 - -  Trouble concentrating - 3 - -  Moving slowly or fidgety/restless - 0 - -  Suicidal thoughts - 0 - -  PHQ-9 Score - 10 - -  Difficult doing work/chores - Somewhat difficult - -    Review of Systems  Constitutional: Negative.   HENT: Negative.   Eyes: Negative.   Respiratory: Negative.   Cardiovascular: Negative.   Gastrointestinal: Negative.   Endocrine: Negative.   Genitourinary: Negative.   Musculoskeletal: Negative.   Skin: Negative.   Allergic/Immunologic: Negative.   Neurological: Negative.   Hematological: Negative.   Psychiatric/Behavioral: Negative.   All other systems reviewed and are negative.      Objective:   Physical Exam  Constitutional: He is oriented to person, place, and time. He appears well-developed and well-nourished. No distress.  HENT:  Head: Normocephalic and atraumatic.  Eyes: Pupils are equal, round, and reactive to light. EOM are normal.  Neck:  Patient has swelling along the carotid incision on the left side.  No evidence of drainage there is extensive ecchymosis  Neurological: He is alert and oriented to person, place, and time.  Skin: Skin is warm and dry. He is not diaphoretic.  Psychiatric: He has a normal mood and affect. Thought content normal. His speech is delayed. He is slowed.  He does not express impulsivity. He is attentive.  Nursing note and vitals reviewed. Motor strength is 5/5 bilateral deltoid bicep tricep grip hip flexor knee extension ankle dorsiflexion There is mild dysdiadochokinesis on rapid alternating supination pronation left forearm. Finger thumb opposition is normal on the right and slowed on the left  Spells WORLD fwd and bkwd Serial 7 to 93 Ambulates without evidence of toe drag or knee instability wide-based support he needs standby assist without his cane for short distances.  Assessment & Plan:  #1.  Right MCA infarct with residual gait disorder and 5 motor deficits as well as cognitive deficits. Neuropsych evaluation next week Follow-up with vascular surgery status post carotid endarterectomy Physical medicine rehab follow-up in 3 months Given patient's side effects of palpitation will discontinue the Ritalin.

## 2017-11-24 ENCOUNTER — Telehealth: Payer: Self-pay | Admitting: Emergency Medicine

## 2017-11-24 ENCOUNTER — Telehealth: Payer: Self-pay

## 2017-11-24 NOTE — Telephone Encounter (Signed)
Called pt daughter and advised her of MD message. She verbalized understanding of this.

## 2017-11-24 NOTE — Telephone Encounter (Signed)
He needs to continue the same dose he is taking at home currently, my last note indicates 18 units

## 2017-11-24 NOTE — Telephone Encounter (Signed)
Pts daughter Dewaine Oats called and has some questions about patients medication along with some other questions. Please give her a call back at [phone]. Thanks.

## 2017-11-24 NOTE — Telephone Encounter (Signed)
Patient's daughter was concerned because she picked up [place] prescription & it was increased to 25 units. I do not see this in the patient's chart. I wanted to make sure which was correct? The 15 units or 25 units? Please advise?

## 2017-11-24 NOTE — Telephone Encounter (Signed)
This is a Dr. Dwyane Dee pt, but he is schedule for a follow up to see Dr. Loanne Drilling. Is there a reason, or was this done by accident? If it was done by mistake, could you please reschedule him for Dr. Dwyane Dee and labs beforehand? Thank you!

## 2017-11-25 ENCOUNTER — Ambulatory Visit (INDEPENDENT_AMBULATORY_CARE_PROVIDER_SITE_OTHER): Payer: Self-pay | Admitting: Vascular Surgery

## 2017-11-25 ENCOUNTER — Encounter: Payer: Self-pay | Admitting: Vascular Surgery

## 2017-11-25 VITALS — BP 119/61 | HR 74 | Temp 97.5°F | Resp 16 | Ht 70.5 in | Wt 214.0 lb

## 2017-11-25 DIAGNOSIS — I6522 Occlusion and stenosis of left carotid artery: Secondary | ICD-10-CM

## 2017-11-25 NOTE — Progress Notes (Signed)
   Patient name: Eric Long MRN: [id number] DOB: 1940/05/17 Sex: male  REASON FOR VISIT: Low up left carotid endarterectomy for symptomatic disease on 11/12/2017  HPI: Eric Long is a 77 y.o. male today for follow-up.  He has a asymptomatic left carotid stenosis and underwent endarterectomy on 11/12/2017.  Did have a preoperative right MCA stroke not related to stenosis.  He was on dual antiplatelets up until the surgery and did have more than the typical oozing from his skin incision.  He actually returned to the emergency department after discharge and had local treatment with topical hemostatic agent.  He has had no new neurologic deficit.  Current Outpatient Medications  Medication Sig Dispense Refill  . acetaminophen (TYLENOL) 325 MG tablet Take 1-2 tablets (325-650 mg total) by mouth every 4 (four) hours as needed for mild pain.    [place] Kitchen aspirin EC 325 MG EC tablet Take 1 tablet (325 mg total) by mouth daily. (Patient taking differently: Take 325 mg by mouth every evening. ) 30 tablet 0  . benazepril (LOTENSIN) 5 MG tablet TAKE 1 TABLET BY MOUTH ONCE DAILY 30 tablet 0  . clopidogrel (PLAVIX) 75 MG tablet TAKE 1 TABLET BY MOUTH ONCE DAILY 30 tablet 0  . ezetimibe (ZETIA) 10 MG tablet TAKE 1 TABLET BY MOUTH ONCE DAILY 30 tablet 0  . glucose blood (ONETOUCH VERIO) test strip Use to check blood sugar 2 times per day by rotation of meals. Dx code: E11.9 100 each 3  . glucose blood (ONETOUCH VERIO) test strip Use as instructed to check blood sugar 2 times daily. 100 each 12  . insulin degludec (TRESIBA FLEXTOUCH) 100 UNIT/ML SOPN FlexTouch Pen Inject 15 Units into the skin every morning.     . liraglutide (VICTOZA) 18 MG/3ML SOPN INJECT 0.3 MLS  (1.8 MG TOTAL) INTO THE SKIN ONCE A DAY (Patient taking differently: INJECT 0.3 MLS  (1.8 MG TOTAL) INTO THE SKIN ONCE A DAY IN THE EVEING) 9 mL 3  . metFORMIN (GLUCOPHAGE-XR) 750 MG 24 hr tablet Take 1 tablet (750 mg  total) by mouth daily with breakfast. 90 tablet 1  . metoprolol succinate (TOPROL-XL) 50 MG 24 hr tablet TAKE 1 TABLET BY MOUTH ONCE DAILY AFTER  BREAKFAST 30 tablet 0  . oxyCODONE-acetaminophen (PERCOCET/ROXICET) 5-325 MG tablet Take 1 tablet by mouth every 6 (six) hours as needed for moderate pain. 10 tablet 0  . RELION PEN NEEDLES 32G X 4 MM MISC USE ONE PEN NEEDLE TWICE DAILY 200 each 2  . rosuvastatin (CRESTOR) 40 MG tablet Take 1 tablet (40 mg total) by mouth daily. 30 tablet 0   No current facility-administered medications for this visit.      PHYSICAL EXAM: Vitals:   11/25/17 1142  BP: 119/61  Pulse: 74  Resp: 16  Temp: (!) 97.5 F (36.4 C)  SpO2: 100%  Weight: 214 lb (97.1 kg)  Height: 5' 10.5" (1.791 m)    GENERAL: The patient is a well-nourished male, in no acute distress. The vital signs are documented above. Left neck incision healing.  Moderate fullness related to blood under the skin.  No bruits bilaterally  MEDICAL ISSUES: Stable postop.  Will continue to resume activities as tolerated.  I will see him again in 9 months with repeat carotid duplex evaluation   Rosetta Posner, MD [facility] Tel [phone] Pager [phone]

## 2017-11-27 ENCOUNTER — Encounter: Payer: Medicare HMO | Attending: Physical Medicine & Rehabilitation | Admitting: Psychology

## 2017-11-27 DIAGNOSIS — E119 Type 2 diabetes mellitus without complications: Secondary | ICD-10-CM | POA: Diagnosis not present

## 2017-11-27 DIAGNOSIS — R414 Neurologic neglect syndrome: Secondary | ICD-10-CM | POA: Diagnosis not present

## 2017-11-27 DIAGNOSIS — F320 Major depressive disorder, single episode, mild: Secondary | ICD-10-CM | POA: Diagnosis not present

## 2017-11-27 DIAGNOSIS — R269 Unspecified abnormalities of gait and mobility: Secondary | ICD-10-CM | POA: Diagnosis not present

## 2017-11-27 DIAGNOSIS — E785 Hyperlipidemia, unspecified: Secondary | ICD-10-CM | POA: Diagnosis not present

## 2017-11-27 DIAGNOSIS — I69319 Unspecified symptoms and signs involving cognitive functions following cerebral infarction: Secondary | ICD-10-CM | POA: Diagnosis not present

## 2017-11-27 DIAGNOSIS — I69398 Other sequelae of cerebral infarction: Secondary | ICD-10-CM | POA: Diagnosis not present

## 2017-11-27 DIAGNOSIS — Z87891 Personal history of nicotine dependence: Secondary | ICD-10-CM | POA: Diagnosis not present

## 2017-11-27 DIAGNOSIS — Z833 Family history of diabetes mellitus: Secondary | ICD-10-CM | POA: Diagnosis not present

## 2017-11-27 DIAGNOSIS — Z95828 Presence of other vascular implants and grafts: Secondary | ICD-10-CM | POA: Diagnosis not present

## 2017-11-30 ENCOUNTER — Encounter: Payer: Self-pay | Admitting: Psychology

## 2017-11-30 NOTE — Progress Notes (Signed)
Patient:  Eric Long   DOB: September 01, 1940  MR Number: [id number]  Location: [facility] [facility] PHYSICAL MEDICINE AND REHABILITATION [address] [place] [id number] [place] [address] Dept: [phone]  Start: 9 AM End: 10 AM  Provider/Observer:     Edgardo Roys PsyD  Chief Complaint:      Chief Complaint  Patient presents with  . Depression  . Stress  . Other  . Cerebrovascular Accident    Reason For Service:     Eric Long is a 77 year old male who has a prior history of diabetes and hyperlipidemia.  The patient was admitted on 07/22/2017 with changes in gait and left-sided weakness over the prior 2 days.  CT revealed findings consistent with acute to subacute infarct.  Right MCA territory infarct due to intracranial and extracranial large vessel atherosclerosis.  The patient also shown signs of a old basal ganglia and lacunar infarcts.  The patient has been treated both on the comprehensive inpatient rehabilitation unit as being followed by outpatient physical, occupational and speech therapy.  The patient is continuing to have frustration and some depression symptoms which was a reason for the referral.  The patient is continued to have visual field cut issues and issues with proprioception and left inattention.  The patient and his wife report that memory appears to be good but his information processing speed is down.  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  The patient has been using voice dictation to compensate for this.  The patient has been having fatigue and muscle changes.  The patient reports that he falls asleep okay and stays asleep but wakes up feeling very tired and fatigued.  The patient's daughter reports that the patient dislikes a lot of foods and the patient confirms this.  While he reports that he always had difficulty  swallowing pills this is become exacerbated since his stroke.  The patient reports that many of the foods that he ate before do not taste or smell the same and he does not like the foods he ate before.  He is having difficulty eating enough and gets frustrated with his family pressures him to eat more.  The primary issues he is dealing with her vision, and inattention to his left side.  Lack of appetite and changes in taste and smell and while memory is okay he is having some difficulty with speed of information processing.  Interventions Strategy:  Cognitive/behavioral therapeutic interventions and building coping skills and strategies for adjusting to the residual effects post stroke.  Participation Level:   Active  Participation Quality:  Appropriate      Behavioral Observation:  Fairly Groomed, Alert, and Appropriate.   Current Psychosocial Factors: The patient reports that he has been frustrated by the changes including changes in gait and visual disturbance as well as impaired proprioception and left neglect.  The patient gets frustrated and feels like his wife is controlling and wants him to more often than he does but he has a constant lack of appetite and foods do not taste the same to them anymore.  Content of Session:   Reviewed current symptoms and worked on Investment banker, operational the ground work for psychotherapeutic interventions.  Current Status:   The patient does have some frustration and depression that are lingering since his stroke.  Patient Progress:   Stable   Impression/Diagnosis:  Eric Long is a 77 year old male who has a prior history of diabetes and hyperlipidemia.  The patient was admitted on 07/22/2017 with changes in gait and left-sided weakness over the prior 2 days.  CT revealed findings consistent with acute to subacute infarct.  Right MCA territory infarct due to intracranial and extracranial large vessel atherosclerosis.  The patient also shown signs of a old basal  ganglia and lacunar infarcts.  The patient has been treated both on the comprehensive inpatient rehabilitation unit as being followed by outpatient physical, occupational and speech therapy.  The patient is continuing to have frustration and some depression symptoms which was a reason for the referral.  The patient is continued to have visual field cut issues and issues with proprioception and left inattention.  The patient and his wife report that memory appears to be good but his information processing speed is down.  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  The patient has been using voice dictation to compensate for this.  The patient has been having fatigue and muscle changes.  The patient reports that he falls asleep okay and stays asleep but wakes up feeling very tired and fatigued.  The patient's daughter reports that the patient dislikes a lot of foods and the patient confirms this.  While he reports that he always had difficulty swallowing pills this is become exacerbated since his stroke.  The patient reports that many of the foods that he ate before do not taste or smell the same and he does not like the foods he ate before.  He is having difficulty eating enough and gets frustrated with his family pressures him to eat more.  The primary issues he is dealing with her vision, and inattention to his left side.  Lack of appetite and changes in taste and smell and while memory is okay he is having some difficulty with speed of information processing.  Interventions Strategy:  Cognitive/behavioral therapeutic interventions and building coping skills and strategies for adjusting to the residual effects post stroke.   Diagnosis:   Gait disturbance, post-stroke  Cognitive deficit, post-stroke  Left-sided neglect  Current mild episode of major depressive disorder without prior episode ([place])

## 2017-12-08 DIAGNOSIS — I639 Cerebral infarction, unspecified: Secondary | ICD-10-CM | POA: Diagnosis not present

## 2017-12-15 ENCOUNTER — Other Ambulatory Visit: Payer: Self-pay | Admitting: Endocrinology

## 2017-12-29 ENCOUNTER — Other Ambulatory Visit (INDEPENDENT_AMBULATORY_CARE_PROVIDER_SITE_OTHER): Payer: Medicare HMO

## 2017-12-29 DIAGNOSIS — E1165 Type 2 diabetes mellitus with hyperglycemia: Secondary | ICD-10-CM | POA: Diagnosis not present

## 2017-12-29 DIAGNOSIS — Z794 Long term (current) use of insulin: Secondary | ICD-10-CM | POA: Diagnosis not present

## 2017-12-29 LAB — COMPREHENSIVE METABOLIC PANEL
ALBUMIN: 3.8 g/dL (ref 3.5–5.2)
ALT: 12 U/L (ref 0–53)
AST: 14 U/L (ref 0–37)
Alkaline Phosphatase: 98 U/L (ref 39–117)
BILIRUBIN TOTAL: 0.3 mg/dL (ref 0.2–1.2)
BUN: 24 mg/dL — AB (ref 6–23)
CO2: 30 mEq/L (ref 19–32)
CREATININE: 1.88 mg/dL — AB (ref 0.40–1.50)
Calcium: 9.6 mg/dL (ref 8.4–10.5)
Chloride: 102 mEq/L (ref 96–112)
GFR: 44.67 mL/min — ABNORMAL LOW (ref >60.00)
Glucose, Bld: 99 mg/dL (ref 70–99)
Potassium: 4.2 mEq/L (ref 3.5–5.1)
SODIUM: 139 meq/L (ref 135–145)
Total Protein: 7.2 g/dL (ref 6.0–8.3)

## 2017-12-29 LAB — HEMOGLOBIN A1C: HEMOGLOBIN A1C: 6.5 % (ref 4.6–6.5)

## 2017-12-29 LAB — MICROALBUMIN / CREATININE URINE RATIO
Creatinine,U: 203.7 mg/dL
Microalb Creat Ratio: 1.6 mg/g (ref 0.0–30.0)
Microalb, Ur: 3.3 mg/dL — ABNORMAL HIGH (ref 0.0–1.9)

## 2017-12-31 ENCOUNTER — Ambulatory Visit (INDEPENDENT_AMBULATORY_CARE_PROVIDER_SITE_OTHER): Payer: Medicare HMO | Admitting: Endocrinology

## 2017-12-31 ENCOUNTER — Ambulatory Visit: Payer: Medicare HMO | Admitting: Endocrinology

## 2017-12-31 ENCOUNTER — Encounter: Payer: Self-pay | Admitting: Endocrinology

## 2017-12-31 VITALS — BP 132/60 | HR 60 | Ht 70.0 in | Wt 217.0 lb

## 2017-12-31 VITALS — Ht 70.5 in

## 2017-12-31 DIAGNOSIS — Z794 Long term (current) use of insulin: Secondary | ICD-10-CM | POA: Diagnosis not present

## 2017-12-31 DIAGNOSIS — E782 Mixed hyperlipidemia: Secondary | ICD-10-CM | POA: Diagnosis not present

## 2017-12-31 DIAGNOSIS — E1165 Type 2 diabetes mellitus with hyperglycemia: Secondary | ICD-10-CM | POA: Diagnosis not present

## 2017-12-31 DIAGNOSIS — E119 Type 2 diabetes mellitus without complications: Secondary | ICD-10-CM

## 2017-12-31 NOTE — Patient Instructions (Signed)
Check blood sugars on waking up  2/7  Also check blood sugars about 2 hours after a meal and do this after different meals by rotation  Recommended blood sugar levels on waking up is 90-130 and about 2 hours after meal is 130-160  Please bring your blood sugar monitor to each visit, thank you  NOTE WHAT FOOD  MAKES IT GO UP

## 2017-12-31 NOTE — Progress Notes (Signed)
Patient ID: Eric Long, male   DOB: 1940-10-19, 77 y.o.   MRN: [id number]   Reason for Appointment:  follow-up of diabetes  History of Present Illness   Diagnosis: Type 2 DIABETES MELITUS, date of diagnosis: 2006      Previous history: He was previously treated with metformin, Amaryl and Januvia but subsequently had poor control with A1c of 11.8 in 2011 and 11.6 in 7/13 In 8/13 is Januvia was stopped and he was started on Victoza and Lantus insulin With this his blood sugars were improved significantly and his Amaryl was stopped. He also started losing weight His A1c in 6/14 was excellent at 6.3  Recent history:    Insulin regimen: Tresiba 18 units at SUPPER, Novolog: None        Non-insulin hypoglycemic drugs: Metformin 750 mg daily, Victoza 1.8 mg daily IN a.m.       His A1c has been recently better and now 6.5   Current management, blood sugar patterns and problems identified:   He was started back on low-dose metformin in June 2019 when his renal function was adequate  However he is checking his blood sugars only sporadically  He has a couple of readings over 200 after lunch or supper but he does not know why they are higher  He usually does not drink regular soft drinks or eat a lot of sweets  His family members also do not think he is going off his diet  However he is getting back some weight  He does take his Victoza consistently  Again not exercising a little walking which is not fast-paced  He has only a couple of readings in the morning fasting and these are near normal       Meal times: Breakfast usually 9 AM: eggs, toast or oatmeal, dinner-7 PM  Monitors blood glucose:  about once a day .    Glucometer: One Probation officer.          Blood Glucose readings from review of monitor download:  FASTING 92, 119, nonfasting range 143-227 readings Overall median 164   Physical activity: exercise: Minimal  Dietician visit:  6/18, has seen CDE in  7/10         Complications: are: Peripheral vascular disease, neuropathy     Wt Readings from Last 3 Encounters:  12/31/17 217 lb (98.4 kg)  11/25/17 214 lb (97.1 kg)  11/20/17 207 lb (93.9 kg)     LABS:  Lab Results  Component Value Date   HGBA1C 6.5 12/29/2017   HGBA1C 6.6 (H) 11/04/2017   HGBA1C 7.2 (H) 08/19/2017   Lab Results  Component Value Date   MICROALBUR 3.3 (H) 12/29/2017   LDLCALC 48 08/19/2017   CREATININE 1.88 (H) 12/29/2017   Lab on 12/29/2017  Component Date Value Ref Range Status  . Microalb, Ur 12/29/2017 3.3* 0.0 - 1.9 mg/dL Final  . Creatinine,U 12/29/2017 203.7  mg/dL Final  . Microalb Creat Ratio 12/29/2017 1.6  0.0 - 30.0 mg/g Final  . Sodium 12/29/2017 139  135 - 145 mEq/L Final  . Potassium 12/29/2017 4.2  3.5 - 5.1 mEq/L Final  . Chloride 12/29/2017 102  96 - 112 mEq/L Final  . CO2 12/29/2017 30  19 - 32 mEq/L Final  . Glucose, Bld 12/29/2017 99  70 - 99 mg/dL Final  . BUN 12/29/2017 24* 6 - 23 mg/dL Final  . Creatinine, Ser 12/29/2017 1.88* 0.40 - 1.50 mg/dL Final  . Total Bilirubin 12/29/2017 0.3  0.2 - 1.2 mg/dL Final  . Alkaline Phosphatase 12/29/2017 98  39 - 117 U/L Final  . AST 12/29/2017 14  0 - 37 U/L Final  . ALT 12/29/2017 12  0 - 53 U/L Final  . Total Protein 12/29/2017 7.2  6.0 - 8.3 g/dL Final  . Albumin 12/29/2017 3.8  3.5 - 5.2 g/dL Final  . Calcium 12/29/2017 9.6  8.4 - 10.5 mg/dL Final  . GFR 12/29/2017 44.67* >60.00 mL/min Final  . Hgb A1c MFr Bld 12/29/2017 6.5  4.6 - 6.5 % Final   Glycemic Control Guidelines for People with Diabetes:Non Diabetic:  <6%Goal of Therapy: <7%Additional Action Suggested:  >8%        Allergies as of 12/31/2017   No Known Allergies     Medication List        Accurate as of 12/31/17  4:43 PM. Always use your most recent med list.          acetaminophen 325 MG tablet Commonly known as:  TYLENOL Take 1-2 tablets (325-650 mg total) by mouth every 4 (four) hours as needed for mild  pain.   aspirin 325 MG EC tablet Take 1 tablet (325 mg total) by mouth daily.   benazepril 5 MG tablet Commonly known as:  LOTENSIN TAKE 1 TABLET BY MOUTH ONCE DAILY   clopidogrel 75 MG tablet Commonly known as:  PLAVIX TAKE 1 TABLET BY MOUTH ONCE DAILY   ezetimibe 10 MG tablet Commonly known as:  ZETIA TAKE 1 TABLET BY MOUTH ONCE DAILY   glucose blood test strip Use to check blood sugar 2 times per day by rotation of meals. Dx code: E11.9   glucose blood test strip Use as instructed to check blood sugar 2 times daily.   liraglutide 18 MG/3ML Sopn Commonly known as:  VICTOZA INJECT 1.8 MG SUBCUTANEOUSLY ONCE DAILY   metFORMIN 750 MG 24 hr tablet Commonly known as:  GLUCOPHAGE-XR Take 1 tablet (750 mg total) by mouth daily with breakfast.   metoprolol succinate 50 MG 24 hr tablet Commonly known as:  TOPROL-XL TAKE 1 TABLET BY MOUTH ONCE DAILY AFTER  BREAKFAST   oxyCODONE-acetaminophen 5-325 MG tablet Commonly known as:  PERCOCET/ROXICET Take 1 tablet by mouth every 6 (six) hours as needed for moderate pain.   RELION PEN NEEDLES 32G X 4 MM Misc Generic drug:  Insulin Pen Needle USE ONE PEN NEEDLE TWICE DAILY   rosuvastatin 40 MG tablet Commonly known as:  CRESTOR Take 1 tablet (40 mg total) by mouth daily.   TRESIBA FLEXTOUCH 100 UNIT/ML Sopn FlexTouch Pen Generic drug:  insulin degludec Inject 15 Units into the skin every morning.       Allergies:  No Known Allergies  Past Medical History:  Diagnosis Date  . Carotid stenosis, asymptomatic, left   . Hyperlipidemia   . Pneumonia    "twice" (11/12/2017)  . Stroke ([place]) 07/21/2017   "left side weaker since" (11/12/2017)  . Type II diabetes mellitus ([place])     Past Surgical History:  Procedure Laterality Date  . CAROTID ENDARTERECTOMY Left 11/12/2017  . ENDARTERECTOMY Left 11/12/2017   Procedure: Left Carotid ENDARTERECTOMY;  Surgeon: Rosetta Posner, MD;  Location: [facility];  Service: Vascular;  Laterality:  Left;  . FEMORAL-FEMORAL BYPASS GRAFT Bilateral 2007  . PATCH ANGIOPLASTY Left 11/12/2017   Procedure: PATCH ANGIOPLASTY using Xenoxure patch;  Surgeon: Rosetta Posner, MD;  Location: [facility] OR;  Service: Vascular;  Laterality: Left;    Family History  Problem  Relation Age of Onset  . Diabetes Sister     Social History:  reports that he quit smoking about 12 years ago. His smoking use included cigarettes. He has a 100.00 pack-year smoking history. He has never used smokeless tobacco. He reports that he does not drink alcohol or use drugs.  Review of Systems:  Hypertension:   His medications have been changed since his hospital discharge and he is taking lower doses of benazepril only now Amlodipine was stopped on his last visit   BP Readings from Last 3 Encounters:  12/31/17 132/60  11/25/17 119/61  11/20/17 133/81    His creatinine is upper normal but no appears to be more consistently higher   Lab Results  Component Value Date   CREATININE 1.88 (H) 12/29/2017   CREATININE 2.34 (H) 11/14/2017   CREATININE 2.20 (H) 11/13/2017     Lipids: Has had mixed hyperlipidemia and also significantly high LDL Previously had persistently high LDL value Even though he kept insisting that he was taking his Crestor and Zetia regularly in the past his labs were consistently abnormal Recently without any change in medications his cholesterol is markedly improved His family thinks that he is taking his medications regularly now also    Lab Results  Component Value Date   CHOL 97 08/19/2017   HDL 24.20 (L) 08/19/2017   LDLCALC 48 08/19/2017   LDLDIRECT 151.0 03/17/2017   TRIG 123.0 08/19/2017   CHOLHDL 4 08/19/2017         Examination:   BP 132/60   Pulse 60   Ht 5\' 10"  (1.778 m)   Wt 217 lb (98.4 kg)   SpO2 97%   BMI 31.14 kg/m   Body mass index is 31.14 kg/m.    ASSESSMENT/ PLAN:   Diabetes type 2 with mild obesity  See history of present illness for detailed  discussion of his current management, blood sugar patterns and problems identified  His A1c is excellent at 6.5  However he has some postprandial high readings over 200 despite taking Victoza However has not done enough readings after meals especially after supper to know if this is consistent Diet seems to be relatively good but he has gained a little weight  Fasting readings are excellent with current dose of Tresiba and adding low-dose metformin  He was recommended more frequent glucose monitoring Discussed checking readings after meals and making a note of what makes his blood sugar go up Start walking regularly Call if blood sugars are consistently high Follow-up again in 3 months  Patient Instructions  Check blood sugars on waking up  2/7  Also check blood sugars about 2 hours after a meal and do this after different meals by rotation  Recommended blood sugar levels on waking up is 90-130 and about 2 hours after meal is 130-160  Please bring your blood sugar monitor to each visit, thank you  NOTE WHAT FOOD  MAKES IT GO UP     Elayne Snare 12/31/2017, 4:43 PM

## 2018-01-07 DIAGNOSIS — Z23 Encounter for immunization: Secondary | ICD-10-CM | POA: Diagnosis not present

## 2018-01-07 DIAGNOSIS — I739 Peripheral vascular disease, unspecified: Secondary | ICD-10-CM | POA: Diagnosis not present

## 2018-01-07 DIAGNOSIS — E1151 Type 2 diabetes mellitus with diabetic peripheral angiopathy without gangrene: Secondary | ICD-10-CM | POA: Diagnosis not present

## 2018-01-07 DIAGNOSIS — I10 Essential (primary) hypertension: Secondary | ICD-10-CM | POA: Diagnosis not present

## 2018-01-07 DIAGNOSIS — I779 Disorder of arteries and arterioles, unspecified: Secondary | ICD-10-CM | POA: Diagnosis not present

## 2018-01-07 DIAGNOSIS — I679 Cerebrovascular disease, unspecified: Secondary | ICD-10-CM | POA: Diagnosis not present

## 2018-01-07 DIAGNOSIS — E782 Mixed hyperlipidemia: Secondary | ICD-10-CM | POA: Diagnosis not present

## 2018-01-07 DIAGNOSIS — D649 Anemia, unspecified: Secondary | ICD-10-CM | POA: Diagnosis not present

## 2018-01-07 DIAGNOSIS — I69354 Hemiplegia and hemiparesis following cerebral infarction affecting left non-dominant side: Secondary | ICD-10-CM | POA: Diagnosis not present

## 2018-01-07 DIAGNOSIS — Z0000 Encounter for general adult medical examination without abnormal findings: Secondary | ICD-10-CM | POA: Diagnosis not present

## 2018-01-08 DIAGNOSIS — I639 Cerebral infarction, unspecified: Secondary | ICD-10-CM | POA: Diagnosis not present

## 2018-01-16 ENCOUNTER — Encounter: Payer: Self-pay | Admitting: *Deleted

## 2018-01-16 DIAGNOSIS — Z006 Encounter for examination for normal comparison and control in clinical research program: Secondary | ICD-10-CM

## 2018-01-16 NOTE — Progress Notes (Signed)
STROKE~AF Research study month 6 follow up completed. Patient denies any chest pain or stroke symptoms. He states no changes in his cardiovascular medication. EQ-5D assessment completed and rates his heath about 50%. I thanked him for his participation in the study and the next research required visit is due in march 2020.

## 2018-01-26 ENCOUNTER — Encounter: Payer: Self-pay | Admitting: Psychology

## 2018-01-26 ENCOUNTER — Encounter: Payer: Medicare HMO | Attending: Physical Medicine & Rehabilitation | Admitting: Psychology

## 2018-01-26 DIAGNOSIS — I69398 Other sequelae of cerebral infarction: Secondary | ICD-10-CM

## 2018-01-26 DIAGNOSIS — R414 Neurologic neglect syndrome: Secondary | ICD-10-CM

## 2018-01-26 DIAGNOSIS — I69319 Unspecified symptoms and signs involving cognitive functions following cerebral infarction: Secondary | ICD-10-CM

## 2018-01-26 DIAGNOSIS — E119 Type 2 diabetes mellitus without complications: Secondary | ICD-10-CM | POA: Insufficient documentation

## 2018-01-26 DIAGNOSIS — E785 Hyperlipidemia, unspecified: Secondary | ICD-10-CM | POA: Diagnosis not present

## 2018-01-26 DIAGNOSIS — Z833 Family history of diabetes mellitus: Secondary | ICD-10-CM | POA: Insufficient documentation

## 2018-01-26 DIAGNOSIS — Z95828 Presence of other vascular implants and grafts: Secondary | ICD-10-CM | POA: Diagnosis not present

## 2018-01-26 DIAGNOSIS — Z87891 Personal history of nicotine dependence: Secondary | ICD-10-CM | POA: Diagnosis not present

## 2018-01-26 DIAGNOSIS — R269 Unspecified abnormalities of gait and mobility: Secondary | ICD-10-CM

## 2018-01-26 NOTE — Progress Notes (Signed)
Patient:  Eric Long   DOB: 04-20-1941  MR Number: [id number]  Location: [facility] PHYSICAL MEDICINE AND REHABILITATION [place], [place] [id number] [place] [id number] Dept: [phone]  Start: 3 PM End: 4 PM  Provider/Observer:     Edgardo Roys PsyD  Chief Complaint:      Chief Complaint  Patient presents with  . Depression  . Stress  . Other    Reason For Service:     Eric Long is a 77 year old male who has a prior history of diabetes and hyperlipidemia.  The patient was admitted on 07/22/2017 with changes in gait and left-sided weakness over the prior 2 days.  CT revealed findings consistent with acute to subacute infarct.  Right MCA territory infarct due to intracranial and extracranial large vessel atherosclerosis.  The patient also shown signs of a old basal ganglia and lacunar infarcts.  The patient has been treated both on the comprehensive inpatient rehabilitation unit as being followed by outpatient physical, occupational and speech therapy.  The patient is continuing to have frustration and some depression symptoms which was a reason for the referral.  The patient is continued to have visual field cut issues and issues with proprioception and left inattention.  The patient and his wife report that memory appears to be good but his information processing speed is down.  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  The patient has been using voice dictation to compensate for this.  The patient has been having fatigue and muscle changes.  The patient reports that he falls asleep okay and stays asleep but wakes up feeling very tired and fatigued.  The patient's daughter reports that the patient dislikes a lot of foods and the patient confirms this.  While he reports that he always had difficulty swallowing pills this is  become exacerbated since his stroke.  The patient reports that many of the foods that he ate before do not taste or smell the same and he does not like the foods he ate before.  He is having difficulty eating enough and gets frustrated with his family pressures him to eat more.  The primary issues he is dealing with her vision, and inattention to his left side.  Lack of appetite and changes in taste and smell and while memory is okay he is having some difficulty with speed of information processing.  The above reason for service has been reviewed for this visit and remains applicable for the current visit.  The patient is continuing to struggle with issues such as sleep-wake cycle and has been staying up most nights and sleeping during the day.  The patient reports that he is doing better with his writing and while he cannot type as well following his stroke the patient is utilizing voice dictation.  Interventions Strategy:  Cognitive/behavioral therapeutic interventions working on building coping skills and strategies adjusting to residual effects of stroke.  Participation Level:   Active  Participation Quality:  Appropriate      Behavioral Observation:  Well Groomed, Alert, and Appropriate.   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  However, the patient's wife comes in for the visit and states that he is having more sleep disturbance and being up at night to work on his  riding but not sleeping very well during the day.  We address this issue today.  Content of Session:   Reviewed current symptoms and continue to work on Investment banker, operational the ground work for psychotherapeutic interventions and working on Therapist, occupational and strategies.  Current Status:   The patient continues to be frustrated with the lingering effects of the stroke but as his symptoms have improved he reports that his depression is  getting much better and his mood state has improved significantly.  Patient Progress:   The patient is continuing to show improvements in overall functioning.   Impression/Diagnosis:   Eric Long is a 77 year old male who has a prior history of diabetes and hyperlipidemia.  The patient was admitted on 07/22/2017 with changes in gait and left-sided weakness over the prior 2 days.  CT revealed findings consistent with acute to subacute infarct.  Right MCA territory infarct due to intracranial and extracranial large vessel atherosclerosis.  The patient also shown signs of a old basal ganglia and lacunar infarcts.  The patient has been treated both on the comprehensive inpatient rehabilitation unit as being followed by outpatient physical, occupational and speech therapy.  The patient is continuing to have frustration and some depression symptoms which was a reason for the referral.  The patient is continued to have visual field cut issues and issues with proprioception and left inattention.  The patient and his wife report that memory appears to be good but his information processing speed is down.  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  The patient has been using voice dictation to compensate for this.  The patient has been having fatigue and muscle changes.  The patient reports that he falls asleep okay and stays asleep but wakes up feeling very tired and fatigued.  The patient's daughter reports that the patient dislikes a lot of foods and the patient confirms this.  While he reports that he always had difficulty swallowing pills this is become exacerbated since his stroke.  The patient reports that many of the foods that he ate before do not taste or smell the same and he does not like the foods he ate before.  He is having difficulty eating enough and gets frustrated with his family pressures him to eat more.  The primary  issues he is dealing with her vision, and inattention to his left side.  Lack of appetite and changes in taste and smell and while memory is okay he is having some difficulty with speed of information processing.  The above reason for service has been reviewed for this visit and remains applicable for the current visit.  The patient is continuing to struggle with issues such as sleep-wake cycle and has been staying up most nights and sleeping during the day.  The patient reports that he is doing better with his writing and while he cannot type as well following his stroke the patient is utilizing voice dictation.  Interventions Strategy:  Cognitive/behavioral therapeutic interventions and building coping skills and strategies for adjusting to the residual effects post stroke.   Diagnosis:   Gait disturbance, post-stroke  Cognitive deficit, post-stroke  Left-sided neglect

## 2018-01-30 ENCOUNTER — Encounter: Payer: Medicare HMO | Admitting: Psychology

## 2018-02-07 DIAGNOSIS — I639 Cerebral infarction, unspecified: Secondary | ICD-10-CM | POA: Diagnosis not present

## 2018-02-09 ENCOUNTER — Encounter: Payer: Self-pay | Admitting: Psychology

## 2018-02-09 ENCOUNTER — Encounter: Payer: Medicare HMO | Attending: Physical Medicine & Rehabilitation | Admitting: Psychology

## 2018-02-09 DIAGNOSIS — R414 Neurologic neglect syndrome: Secondary | ICD-10-CM | POA: Diagnosis not present

## 2018-02-09 DIAGNOSIS — Z87891 Personal history of nicotine dependence: Secondary | ICD-10-CM | POA: Insufficient documentation

## 2018-02-09 DIAGNOSIS — F320 Major depressive disorder, single episode, mild: Secondary | ICD-10-CM

## 2018-02-09 DIAGNOSIS — E785 Hyperlipidemia, unspecified: Secondary | ICD-10-CM | POA: Insufficient documentation

## 2018-02-09 DIAGNOSIS — E119 Type 2 diabetes mellitus without complications: Secondary | ICD-10-CM | POA: Diagnosis not present

## 2018-02-09 DIAGNOSIS — R269 Unspecified abnormalities of gait and mobility: Secondary | ICD-10-CM

## 2018-02-09 DIAGNOSIS — I69398 Other sequelae of cerebral infarction: Secondary | ICD-10-CM | POA: Diagnosis not present

## 2018-02-09 DIAGNOSIS — Z95828 Presence of other vascular implants and grafts: Secondary | ICD-10-CM | POA: Insufficient documentation

## 2018-02-09 DIAGNOSIS — Z833 Family history of diabetes mellitus: Secondary | ICD-10-CM | POA: Diagnosis not present

## 2018-02-09 DIAGNOSIS — I69319 Unspecified symptoms and signs involving cognitive functions following cerebral infarction: Secondary | ICD-10-CM

## 2018-02-09 NOTE — Progress Notes (Signed)
Patient:  Eric Long   DOB: 03-Jul-1940  MR Number: [id number]  Location: [facility] PHYSICAL MEDICINE AND REHABILITATION [place], [place] [id number] [place] [address] Dept: [phone]  Start: 3 PM End: 4 PM  Provider/Observer:     Edgardo Roys PsyD  Chief Complaint:      Chief Complaint  Patient presents with  . Depression  . Stress  . Other    Reason For Service:     Eric Long is a 77 year old male who has a prior history of diabetes and hyperlipidemia.  The patient was admitted on 07/22/2017 with changes in gait and left-sided weakness over the prior 2 days.  CT revealed findings consistent with acute to subacute infarct.  Right MCA territory infarct due to intracranial and extracranial large vessel atherosclerosis.  The patient also shown signs of a old basal ganglia and lacunar infarcts.  The patient has been treated both on the comprehensive inpatient rehabilitation unit as being followed by outpatient physical, occupational and speech therapy.  The patient is continuing to have frustration and some depression symptoms which was a reason for the referral.  The patient is continued to have visual field cut issues and issues with proprioception and left inattention.  The patient and his wife report that memory appears to be good but his information processing speed is down.  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  The patient has been using voice dictation to compensate for this.  The patient has been having fatigue and muscle changes.  The patient reports that he falls asleep okay and stays asleep but wakes up feeling very tired and fatigued.  The patient's daughter reports that the patient dislikes a lot of foods and the patient confirms this.  While he reports that he always had difficulty swallowing pills this is  become exacerbated since his stroke.  The patient reports that many of the foods that he ate before do not taste or smell the same and he does not like the foods he ate before.  He is having difficulty eating enough and gets frustrated with his family pressures him to eat more.  The primary issues he is dealing with her vision, and inattention to his left side.  Lack of appetite and changes in taste and smell and while memory is okay he is having some difficulty with speed of information processing.  The above reason for service has been reviewed for this visit and remains applicable for the current visit.  The patient reports that he has been doing much better with his sleep-wake cycle recently and has been working on the sleep hygiene issues that we have developed.  The patient reports that he is continuing to work on his riding.  The patient reports that he is having some startle responses more recently and is staying hyperalert.  Patient reports that his depression has improved significantly.  Interventions Strategy:  Cognitive/behavioral therapeutic interventions working on building coping skills and strategies adjusting to residual effects of stroke.  Participation Level:   Active  Participation Quality:  Appropriate      Behavioral Observation:  Well Groomed, Alert, and Appropriate.   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  The patient reports that his sleep patterns have been improving that he is now staying  awake during the day and sleeping at night although he is sleeping well into the morning and getting more than 12 hours of sleep each night.  We talked about getting the appropriate level of sleep.  Patient reports that he is continuing to be rather irritable but some of this is premorbid.  Content of Session:   Reviewed current symptoms and continue to work on Investment banker, operational the ground work for  psychotherapeutic interventions and working on Therapist, occupational and strategies around issues related to depression and residual effects of his stroke.  Current Status:   The patient continues to be frustrated to some degree around the effects of his stroke but he reports that this frustration is been improving greatly.  The patient reports that he has been improving his sleep patterns and that his depression is also improved significantly.  Patient Progress:   The patient is continuing to show improvements in overall functioning.   Impression/Diagnosis:   Eric Long is a 77 year old male who has a prior history of diabetes and hyperlipidemia.  The patient was admitted on 07/22/2017 with changes in gait and left-sided weakness over the prior 2 days.  CT revealed findings consistent with acute to subacute infarct.  Right MCA territory infarct due to intracranial and extracranial large vessel atherosclerosis.  The patient also shown signs of a old basal ganglia and lacunar infarcts.  The patient has been treated both on the comprehensive inpatient rehabilitation unit as being followed by outpatient physical, occupational and speech therapy.  The patient is continuing to have frustration and some depression symptoms which was a reason for the referral.  The patient is continued to have visual field cut issues and issues with proprioception and left inattention.  The patient and his wife report that memory appears to be good but his information processing speed is down.  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  The patient has been using voice dictation to compensate for this.  The patient has been having fatigue and muscle changes.  The patient reports that he falls asleep okay and stays asleep but wakes up feeling very tired and fatigued.  The patient's daughter reports that the patient dislikes a lot of foods and the patient  confirms this.  While he reports that he always had difficulty swallowing pills this is become exacerbated since his stroke.  The patient reports that many of the foods that he ate before do not taste or smell the same and he does not like the foods he ate before.  He is having difficulty eating enough and gets frustrated with his family pressures him to eat more.  The primary issues he is dealing with her vision, and inattention to his left side.  Lack of appetite and changes in taste and smell and while memory is okay he is having some difficulty with speed of information processing.  The above reason for service has been reviewed for this visit and remains applicable for the current visit.  The patient continues to show improvements in his overall sleep-wake cycle and improving his symptoms of depression.  He has been working on increasing his riding activities as well.   Interventions Strategy:  Cognitive/behavioral therapeutic interventions and building coping skills and strategies for adjusting to the residual effects post stroke.   Diagnosis:   Gait disturbance, post-stroke  Cognitive deficit, post-stroke  Left-sided neglect  Current mild episode of major depressive disorder without  prior episode ([place])

## 2018-02-13 ENCOUNTER — Ambulatory Visit: Payer: Medicare HMO | Admitting: Psychology

## 2018-02-19 ENCOUNTER — Encounter: Payer: Medicare HMO | Admitting: Psychology

## 2018-02-20 ENCOUNTER — Ambulatory Visit: Payer: Medicare HMO | Admitting: Physical Medicine & Rehabilitation

## 2018-02-23 ENCOUNTER — Ambulatory Visit (HOSPITAL_BASED_OUTPATIENT_CLINIC_OR_DEPARTMENT_OTHER): Payer: Medicare HMO | Admitting: Physical Medicine & Rehabilitation

## 2018-02-23 ENCOUNTER — Encounter: Payer: Self-pay | Admitting: Physical Medicine & Rehabilitation

## 2018-02-23 VITALS — BP 126/78 | HR 114 | Resp 14 | Ht 70.5 in | Wt 205.0 lb

## 2018-02-23 DIAGNOSIS — R69 Illness, unspecified: Secondary | ICD-10-CM | POA: Diagnosis not present

## 2018-02-23 DIAGNOSIS — I69398 Other sequelae of cerebral infarction: Secondary | ICD-10-CM

## 2018-02-23 DIAGNOSIS — I69319 Unspecified symptoms and signs involving cognitive functions following cerebral infarction: Secondary | ICD-10-CM

## 2018-02-23 DIAGNOSIS — E119 Type 2 diabetes mellitus without complications: Secondary | ICD-10-CM | POA: Diagnosis not present

## 2018-02-23 DIAGNOSIS — F320 Major depressive disorder, single episode, mild: Secondary | ICD-10-CM | POA: Diagnosis not present

## 2018-02-23 DIAGNOSIS — Z87891 Personal history of nicotine dependence: Secondary | ICD-10-CM | POA: Diagnosis not present

## 2018-02-23 DIAGNOSIS — R269 Unspecified abnormalities of gait and mobility: Secondary | ICD-10-CM | POA: Diagnosis not present

## 2018-02-23 DIAGNOSIS — Z833 Family history of diabetes mellitus: Secondary | ICD-10-CM | POA: Diagnosis not present

## 2018-02-23 DIAGNOSIS — R414 Neurologic neglect syndrome: Secondary | ICD-10-CM | POA: Diagnosis not present

## 2018-02-23 DIAGNOSIS — E785 Hyperlipidemia, unspecified: Secondary | ICD-10-CM | POA: Diagnosis not present

## 2018-02-23 DIAGNOSIS — Z95828 Presence of other vascular implants and grafts: Secondary | ICD-10-CM | POA: Diagnosis not present

## 2018-02-23 NOTE — Patient Instructions (Addendum)
Please call for appt if needed

## 2018-02-23 NOTE — Progress Notes (Signed)
Subjective:    Patient ID: Eric Long, male    DOB: March 06, 1941, 77 y.o.   MRN: [id number]  HPI No problems from [facility], has f/u with [facility] No falls or injuries Using cane to ambulate on steps and outside the home Sees Endocrine for DM management  Dr Melinda Crutch is PCP, had flu shot  Patient without new complaints since last visit Pain Inventory Average Pain 0 Pain Right Now 0 My pain is no pain  In the last 24 hours, has pain interfered with the following? General activity 0 Relation with others 0 Enjoyment of life 0 What TIME of day is your pain at its worst? no pain Sleep (in general) Fair  Pain is worse with: no pain Pain improves with: no pain        Family History  Problem Relation Age of Onset  . Diabetes Sister    Social History   Socioeconomic History  . Marital status: Married    Spouse name: Not on file  . Number of children: Not on file  . Years of education: Not on file  . Highest education level: Not on file  Occupational History  . Not on file  Social Needs  . Financial resource strain: Not on file  . Food insecurity:    Worry: Not on file    Inability: Not on file  . Transportation needs:    Medical: Not on file    Non-medical: Not on file  Tobacco Use  . Smoking status: Former Smoker    Packs/day: 2.00    Years: 50.00    Pack years: 100.00    Types: Cigarettes    Last attempt to quit: 2007    Years since quitting: 12.8  . Smokeless tobacco: Never Used  Substance and Sexual Activity  . Alcohol use: Never    Frequency: Never  . Drug use: Never  . Sexual activity: Yes  Lifestyle  . Physical activity:    Days per week: Not on file    Minutes per session: Not on file  . Stress: Not on file  Relationships  . Social connections:    Talks on phone: Not on file    Gets together: Not on file    Attends religious service: Not on file    Active member of club or organization: Not on file    Attends meetings of clubs or  organizations: Not on file    Relationship status: Not on file  Other Topics Concern  . Not on file  Social History Narrative  . Not on file   Past Surgical History:  Procedure Laterality Date  . CAROTID ENDARTERECTOMY Left 11/12/2017  . ENDARTERECTOMY Left 11/12/2017   Procedure: Left Carotid ENDARTERECTOMY;  Surgeon: Rosetta Posner, MD;  Location: [place];  Service: Vascular;  Laterality: Left;  . FEMORAL-FEMORAL BYPASS GRAFT Bilateral 2007  . PATCH ANGIOPLASTY Left 11/12/2017   Procedure: PATCH ANGIOPLASTY using Xenoxure patch;  Surgeon: Rosetta Posner, MD;  Location: [facility];  Service: Vascular;  Laterality: Left;   Past Medical History:  Diagnosis Date  . Carotid stenosis, asymptomatic, left   . Hyperlipidemia   . Pneumonia    "twice" (11/12/2017)  . Stroke ([place]) 07/21/2017   "left side weaker since" (11/12/2017)  . Type II diabetes mellitus (HCC)    BP 126/78   Pulse (!) 114   Resp 14   Ht 5' 10.5" (1.791 m)   Wt 205 lb (93 kg)   SpO2 98%  BMI 29.00 kg/m   Opioid Risk Score:   Fall Risk Score:  `1  Depression screen PHQ 2/9  Depression screen [facility] 2/9 11/20/2017 08/18/2017 10/21/2016 07/11/2014  Decreased Interest 0 3 0 0  Down, Depressed, Hopeless 0 1 0 0  PHQ - 2 Score 0 4 0 0  Altered sleeping - 1 - -  Tired, decreased energy - 1 - -  Change in appetite - 1 - -  Feeling bad or failure about yourself  - 0 - -  Trouble concentrating - 3 - -  Moving slowly or fidgety/restless - 0 - -  Suicidal thoughts - 0 - -  PHQ-9 Score - 10 - -  Difficult doing work/chores - Somewhat difficult - -           Family History  Problem Relation Age of Onset  . Diabetes Sister    Social History   Socioeconomic History  . Marital status: Married    Spouse name: Not on file  . Number of children: Not on file  . Years of education: Not on file  . Highest education level: Not on file  Occupational History  . Not on file  Social Needs  . Financial resource strain: Not on  file  . Food insecurity:    Worry: Not on file    Inability: Not on file  . Transportation needs:    Medical: Not on file    Non-medical: Not on file  Tobacco Use  . Smoking status: Former Smoker    Packs/day: 2.00    Years: 50.00    Pack years: 100.00    Types: Cigarettes    Last attempt to quit: 2007    Years since quitting: 12.8  . Smokeless tobacco: Never Used  Substance and Sexual Activity  . Alcohol use: Never    Frequency: Never  . Drug use: Never  . Sexual activity: Yes  Lifestyle  . Physical activity:    Days per week: Not on file    Minutes per session: Not on file  . Stress: Not on file  Relationships  . Social connections:    Talks on phone: Not on file    Gets together: Not on file    Attends religious service: Not on file    Active member of club or organization: Not on file    Attends meetings of clubs or organizations: Not on file    Relationship status: Not on file  Other Topics Concern  . Not on file  Social History Narrative  . Not on file   Past Surgical History:  Procedure Laterality Date  . CAROTID ENDARTERECTOMY Left 11/12/2017  . ENDARTERECTOMY Left 11/12/2017   Procedure: Left Carotid ENDARTERECTOMY;  Surgeon: Rosetta Posner, MD;  Location: [place];  Service: Vascular;  Laterality: Left;  . FEMORAL-FEMORAL BYPASS GRAFT Bilateral 2007  . PATCH ANGIOPLASTY Left 11/12/2017   Procedure: PATCH ANGIOPLASTY using Xenoxure patch;  Surgeon: Rosetta Posner, MD;  Location[facility] OR;  Service: Vascular;  Laterality: Left;   Past Medical History:  Diagnosis Date  . Carotid stenosis, asymptomatic, left   . Hyperlipidemia   . Pneumonia    "twice" (11/12/2017)  . Stroke ([place]) 07/21/2017   "left side weaker since" (11/12/2017)  . Type II diabetes mellitus (HCC)    BP 126/78   Pulse (!) 114   Resp 14   Ht 5' 10.5" (1.791 m)   Wt 205 lb (93 kg)   SpO2 98%   BMI 29.00  kg/m   Opioid Risk Score:   Fall Risk Score:  `1  Depression screen PHQ  2/9  Depression screen [facility] 2/9 11/20/2017 08/18/2017 10/21/2016 07/11/2014  Decreased Interest 0 3 0 0  Down, Depressed, Hopeless 0 1 0 0  PHQ - 2 Score 0 4 0 0  Altered sleeping - 1 - -  Tired, decreased energy - 1 - -  Change in appetite - 1 - -  Feeling bad or failure about yourself  - 0 - -  Trouble concentrating - 3 - -  Moving slowly or fidgety/restless - 0 - -  Suicidal thoughts - 0 - -  PHQ-9 Score - 10 - -  Difficult doing work/chores - Somewhat difficult - -    Review of Systems  Constitutional: Negative.   HENT: Negative.   Eyes: Negative.   Respiratory: Negative.   Cardiovascular: Negative.   Gastrointestinal: Negative.   Endocrine: Negative.   Genitourinary: Negative.   Musculoskeletal: Positive for gait problem.  Skin: Negative.   Allergic/Immunologic: Negative.   Hematological: Negative.   Psychiatric/Behavioral: Negative.   All other systems reviewed and are negative.      Objective:   Physical Exam  Constitutional: He is oriented to person, place, and time. He appears well-developed and well-nourished.  HENT:  Head: Normocephalic and atraumatic.  Eyes: Pupils are equal, round, and reactive to light.  Musculoskeletal: Normal range of motion.  Neurological: He is alert and oriented to person, place, and time.  Patient requires multiple cues to perform finger to thumb opposition with the left side.  This included both visual as well as auditory cueing. Motor strength is 5/5 bilateral deltoid, bicep, tricep, grip, hip flexor, knee extensor, ankle dorsiflexor Decreased fine motor left finger thumb opposition. Ambulates with wide-based gait no evidence of toe drag or knee instability.  Nursing note and vitals reviewed.         Assessment & Plan:  1.  Residual left neglect and cognitive deficits as well as gait disorder related to right MCA distribution infarct from intracranial atherosclerosis.  Physically he is plateauing in his level of functioning.   Fortunately he is near modified independent level.  His cognition may improve over the course the next 6 months.   He will follow-up with neuropsychology in January.  He also has follow-ups with neurology.

## 2018-03-06 DIAGNOSIS — H35033 Hypertensive retinopathy, bilateral: Secondary | ICD-10-CM | POA: Diagnosis not present

## 2018-03-06 DIAGNOSIS — H1851 Endothelial corneal dystrophy: Secondary | ICD-10-CM | POA: Diagnosis not present

## 2018-03-06 DIAGNOSIS — H40023 Open angle with borderline findings, high risk, bilateral: Secondary | ICD-10-CM | POA: Diagnosis not present

## 2018-03-06 DIAGNOSIS — H53453 Other localized visual field defect, bilateral: Secondary | ICD-10-CM | POA: Diagnosis not present

## 2018-03-06 DIAGNOSIS — H2513 Age-related nuclear cataract, bilateral: Secondary | ICD-10-CM | POA: Diagnosis not present

## 2018-03-10 DIAGNOSIS — I639 Cerebral infarction, unspecified: Secondary | ICD-10-CM | POA: Diagnosis not present

## 2018-03-17 ENCOUNTER — Ambulatory Visit
Admission: RE | Admit: 2018-03-17 | Discharge: 2018-03-17 | Disposition: A | Discharge status: Home / Self Care | Payer: Medicare HMO | Source: Ambulatory Visit | Attending: Family Medicine | Admitting: Family Medicine

## 2018-03-17 ENCOUNTER — Other Ambulatory Visit: Payer: Self-pay | Admitting: Family Medicine

## 2018-03-17 DIAGNOSIS — R600 Localized edema: Secondary | ICD-10-CM

## 2018-03-17 DIAGNOSIS — I82499 Acute embolism and thrombosis of other specified deep vein of unspecified lower extremity: Secondary | ICD-10-CM

## 2018-03-17 DIAGNOSIS — I499 Cardiac arrhythmia, unspecified: Secondary | ICD-10-CM | POA: Diagnosis not present

## 2018-03-17 DIAGNOSIS — I724 Aneurysm of artery of lower extremity: Secondary | ICD-10-CM | POA: Diagnosis not present

## 2018-03-18 ENCOUNTER — Other Ambulatory Visit: Payer: Self-pay

## 2018-03-18 ENCOUNTER — Ambulatory Visit (HOSPITAL_COMMUNITY)
Admission: RE | Admit: 2018-03-18 | Discharge: 2018-03-18 | Disposition: A | Discharge status: Home / Self Care | Payer: Medicare HMO | Source: Ambulatory Visit | Attending: Family | Admitting: Family

## 2018-03-18 DIAGNOSIS — I70209 Unspecified atherosclerosis of native arteries of extremities, unspecified extremity: Secondary | ICD-10-CM

## 2018-03-20 ENCOUNTER — Other Ambulatory Visit: Payer: Self-pay

## 2018-03-20 ENCOUNTER — Encounter: Payer: Self-pay | Admitting: Vascular Surgery

## 2018-03-20 ENCOUNTER — Ambulatory Visit (INDEPENDENT_AMBULATORY_CARE_PROVIDER_SITE_OTHER): Payer: Medicare HMO | Admitting: Vascular Surgery

## 2018-03-20 VITALS — BP 103/60 | HR 102 | Temp 97.1°F | Resp 20 | Ht 70.5 in | Wt 223.0 lb

## 2018-03-20 DIAGNOSIS — I70209 Unspecified atherosclerosis of native arteries of extremities, unspecified extremity: Secondary | ICD-10-CM

## 2018-03-20 DIAGNOSIS — I729 Aneurysm of unspecified site: Secondary | ICD-10-CM | POA: Diagnosis not present

## 2018-03-20 NOTE — Progress Notes (Signed)
Patient ID: Eric Long, male   DOB: 1941-02-14, 77 y.o.   MRN: [id number]  Reason for Consult: Carotid (Dr. Cari Caraway on 11/12.  4.8x2.2cm hematoma or partially thrombosed pseudoaneurysm on L femoral artery. )   Referred by Cari Caraway, MD  Subjective:     HPI:  Eric Long is a 77 y.o. male with history of carotid stenosis also has diabetes and states that he has had an aortobifemoral bypass in the past but does not know where this was done.  There is no record of a previous aortobifemoral bypass and he has documented femorofemoral bypass.  Either way he has had swelling of his left lower extremity for the past month.  He has not worn compression stockings.  He had a negative DVT study that demonstrated a possible hematoma in his left groin.  He has no pain in that area.  He has not had tissue loss or ulceration.  He does continue to walk.  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  Past Medical History:  Diagnosis Date  . Carotid stenosis, asymptomatic, left   . Hyperlipidemia   . Pneumonia    "twice" (11/12/2017)  . Stroke ([place]) 07/21/2017   "left side weaker since" (11/12/2017)  . Type II diabetes mellitus (HCC)    Family History  Problem Relation Age of Onset  . Diabetes Sister    Past Surgical History:  Procedure Laterality Date  . CAROTID ENDARTERECTOMY Left 11/12/2017  . ENDARTERECTOMY Left 11/12/2017   Procedure: Left Carotid ENDARTERECTOMY;  Surgeon: Rosetta Posner, MD;  Location: [place];  Service: Vascular;  Laterality: Left;  . FEMORAL-FEMORAL BYPASS GRAFT Bilateral 2007  . PATCH ANGIOPLASTY Left 11/12/2017   Procedure: PATCH ANGIOPLASTY using Xenoxure patch;  Surgeon: Rosetta Posner, MD;  Location: [place];  Service: Vascular;  Laterality: Left;    Short Social History:  Social History   Tobacco Use  . Smoking status: Former Smoker    Packs/day: 2.00    Years: 50.00    Pack years: 100.00    Types: Cigarettes    Last attempt to quit: 2007    Years since  quitting: 12.8  . Smokeless tobacco: Never Used  Substance Use Topics  . Alcohol use: Never    Frequency: Never    No Known Allergies  Current Outpatient Medications  Medication Sig Dispense Refill  . acetaminophen (TYLENOL) 325 MG tablet Take 1-2 tablets (325-650 mg total) by mouth every 4 (four) hours as needed for mild pain.    Marland Kitchen aspirin EC 325 MG EC tablet Take 1 tablet (325 mg total) by mouth daily. (Patient taking differently: Take 325 mg by mouth every evening. ) 30 tablet 0  . benazepril (LOTENSIN) 5 MG tablet TAKE 1 TABLET BY MOUTH ONCE DAILY 30 tablet 0  . clopidogrel (PLAVIX) 75 MG tablet TAKE 1 TABLET BY MOUTH ONCE DAILY 30 tablet 0  . ezetimibe (ZETIA) 10 MG tablet TAKE 1 TABLET BY MOUTH ONCE DAILY 30 tablet 0  . glucose blood (ONETOUCH VERIO) test strip Use to check blood sugar 2 times per day by rotation of meals. Dx code: E11.9 100 each 3  . glucose blood (ONETOUCH VERIO) test strip Use as instructed to check blood sugar 2 times daily. 100 each 12  . insulin degludec (TRESIBA FLEXTOUCH) 100 UNIT/ML SOPN FlexTouch Pen Inject 15 Units into the skin every morning.     . liraglutide (VICTOZA) 18 MG/3ML SOPN INJECT 1.8 MG SUBCUTANEOUSLY ONCE DAILY 9 pen  3  . metFORMIN (GLUCOPHAGE-XR) 750 MG 24 hr tablet Take 1 tablet (750 mg total) by mouth daily with breakfast. 90 tablet 1  . metoprolol succinate (TOPROL-XL) 50 MG 24 hr tablet TAKE 1 TABLET BY MOUTH ONCE DAILY AFTER  BREAKFAST 30 tablet 0  . oxyCODONE-acetaminophen (PERCOCET/ROXICET) 5-325 MG tablet Take 1 tablet by mouth every 6 (six) hours as needed for moderate pain. 10 tablet 0  . RELION PEN NEEDLES 32G X 4 MM MISC USE ONE PEN NEEDLE TWICE DAILY 200 each 2  . rosuvastatin (CRESTOR) 40 MG tablet Take 1 tablet (40 mg total) by mouth daily. 30 tablet 0   No current facility-administered medications for this visit.     Review of Systems  Constitutional:  Constitutional negative. HENT: HENT negative.  Eyes: Eyes negative.    Cardiovascular: Positive for leg swelling.  Musculoskeletal: Musculoskeletal negative.  Skin: Skin negative.  Neurological: Neurological negative. Hematologic: Hematologic/lymphatic negative.  Psychiatric: Psychiatric negative.        Objective:  Objective   Vitals:   03/20/18 1032  BP: 103/60  Pulse: (!) 102  Resp: 20  Temp: (!) 97.1 F (36.2 C)  TempSrc: Oral  SpO2: 100%  Weight: 223 lb (101.2 kg)  Height: 5' 10.5" (1.791 m)   Body mass index is 31.54 kg/m.  Physical Exam  Constitutional: He is oriented to person, place, and time. He appears well-developed.  HENT:  Head: Normocephalic.  Eyes: Pupils are equal, round, and reactive to light.  Neck: Normal range of motion. Neck supple.  Well-healed left neck incision  Cardiovascular: Normal rate.  Pulses:      Femoral pulses are 2+ on the right side, and 2+ on the left side. Abdominal: Soft.  Musculoskeletal: Normal range of motion. He exhibits edema.  Neurological: He is alert and oriented to person, place, and time.  Skin: Skin is warm and dry. Capillary refill takes 2 to 3 seconds.  Psychiatric: He has a normal mood and affect. His behavior is normal. Judgment and thought content normal.    Data: I have reviewed his recent DVT study which demonstrates possible hematoma versus partially thrombosed pseudoaneurysm of the anterior left common femoral artery.     Assessment/Plan:     77 year old male presents for evaluation of left lower extremity swelling and he was found to have possible hematoma or pseudoaneurysm in his left groin which was incompletely evaluated by ultrasound.  This is most concerning for pseudoaneurysm of what is either an aortobifemoral femoral-femoral bypass graft although in reviewing his previous angiogram it is likely aortobifemoral graft and he also has a large abdominal incision.  I told him that if this is a 4.5 cm pseudoaneurysm that would need surgical repair.  We will get a CT scan  which will likely be done without contrast given his elevated creatinine to evaluate have him back in a few weeks.  I discussed with him the signs and symptoms of rupture which would require urgent medical attention he demonstrates good understanding in the presence of his wife.  CT scan follow-up in a few weeks for consideration of surgical repair.  I have also discussed with him wearing compression stockings.  His swelling on the left may be related to the pseudoaneurysm and compression of the corresponding vein.     Waynetta Sandy MD [facility]

## 2018-03-24 ENCOUNTER — Encounter: Payer: Self-pay | Admitting: Family Medicine

## 2018-03-27 ENCOUNTER — Observation Stay (HOSPITAL_COMMUNITY)
Admission: EM | Admit: 2018-03-27 | Discharge: 2018-04-05 | Disposition: E | Payer: Medicare HMO | Attending: Critical Care Medicine | Admitting: Critical Care Medicine

## 2018-03-27 ENCOUNTER — Encounter (HOSPITAL_COMMUNITY): Payer: Self-pay | Admitting: *Deleted

## 2018-03-27 ENCOUNTER — Emergency Department (HOSPITAL_COMMUNITY): Payer: Medicare HMO

## 2018-03-27 DIAGNOSIS — I469 Cardiac arrest, cause unspecified: Secondary | ICD-10-CM | POA: Diagnosis not present

## 2018-03-27 DIAGNOSIS — S01512A Laceration without foreign body of oral cavity, initial encounter: Secondary | ICD-10-CM | POA: Insufficient documentation

## 2018-03-27 DIAGNOSIS — E7800 Pure hypercholesterolemia, unspecified: Secondary | ICD-10-CM | POA: Insufficient documentation

## 2018-03-27 DIAGNOSIS — Z794 Long term (current) use of insulin: Secondary | ICD-10-CM | POA: Diagnosis not present

## 2018-03-27 DIAGNOSIS — N183 Chronic kidney disease, stage 3 (moderate): Secondary | ICD-10-CM | POA: Diagnosis not present

## 2018-03-27 DIAGNOSIS — R000 Tachycardia, unspecified: Secondary | ICD-10-CM | POA: Diagnosis not present

## 2018-03-27 DIAGNOSIS — I6522 Occlusion and stenosis of left carotid artery: Secondary | ICD-10-CM | POA: Insufficient documentation

## 2018-03-27 DIAGNOSIS — I499 Cardiac arrhythmia, unspecified: Secondary | ICD-10-CM | POA: Diagnosis not present

## 2018-03-27 DIAGNOSIS — S1189XA Other open wound of other specified part of neck, initial encounter: Secondary | ICD-10-CM | POA: Diagnosis not present

## 2018-03-27 DIAGNOSIS — Z859 Personal history of malignant neoplasm, unspecified: Secondary | ICD-10-CM | POA: Diagnosis not present

## 2018-03-27 DIAGNOSIS — X58XXXA Exposure to other specified factors, initial encounter: Secondary | ICD-10-CM | POA: Insufficient documentation

## 2018-03-27 DIAGNOSIS — N179 Acute kidney failure, unspecified: Secondary | ICD-10-CM | POA: Insufficient documentation

## 2018-03-27 DIAGNOSIS — G9340 Encephalopathy, unspecified: Secondary | ICD-10-CM | POA: Insufficient documentation

## 2018-03-27 DIAGNOSIS — Z7982 Long term (current) use of aspirin: Secondary | ICD-10-CM | POA: Insufficient documentation

## 2018-03-27 DIAGNOSIS — J811 Chronic pulmonary edema: Secondary | ICD-10-CM | POA: Diagnosis not present

## 2018-03-27 DIAGNOSIS — Z79899 Other long term (current) drug therapy: Secondary | ICD-10-CM | POA: Diagnosis not present

## 2018-03-27 DIAGNOSIS — I129 Hypertensive chronic kidney disease with stage 1 through stage 4 chronic kidney disease, or unspecified chronic kidney disease: Secondary | ICD-10-CM | POA: Diagnosis not present

## 2018-03-27 DIAGNOSIS — I70209 Unspecified atherosclerosis of native arteries of extremities, unspecified extremity: Secondary | ICD-10-CM | POA: Insufficient documentation

## 2018-03-27 DIAGNOSIS — Z9862 Peripheral vascular angioplasty status: Secondary | ICD-10-CM | POA: Insufficient documentation

## 2018-03-27 DIAGNOSIS — E1122 Type 2 diabetes mellitus with diabetic chronic kidney disease: Secondary | ICD-10-CM | POA: Diagnosis not present

## 2018-03-27 DIAGNOSIS — Z87891 Personal history of nicotine dependence: Secondary | ICD-10-CM | POA: Diagnosis not present

## 2018-03-27 DIAGNOSIS — D62 Acute posthemorrhagic anemia: Secondary | ICD-10-CM | POA: Diagnosis not present

## 2018-03-27 DIAGNOSIS — J95830 Postprocedural hemorrhage and hematoma of a respiratory system organ or structure following a respiratory system procedure: Secondary | ICD-10-CM | POA: Diagnosis not present

## 2018-03-27 DIAGNOSIS — Z8673 Personal history of transient ischemic attack (TIA), and cerebral infarction without residual deficits: Secondary | ICD-10-CM | POA: Insufficient documentation

## 2018-03-27 DIAGNOSIS — E1165 Type 2 diabetes mellitus with hyperglycemia: Secondary | ICD-10-CM | POA: Diagnosis not present

## 2018-03-27 DIAGNOSIS — Z833 Family history of diabetes mellitus: Secondary | ICD-10-CM | POA: Insufficient documentation

## 2018-03-27 DIAGNOSIS — J90 Pleural effusion, not elsewhere classified: Secondary | ICD-10-CM | POA: Diagnosis not present

## 2018-03-27 DIAGNOSIS — R58 Hemorrhage, not elsewhere classified: Secondary | ICD-10-CM | POA: Diagnosis not present

## 2018-03-27 DIAGNOSIS — R040 Epistaxis: Secondary | ICD-10-CM | POA: Diagnosis not present

## 2018-03-27 DIAGNOSIS — R404 Transient alteration of awareness: Secondary | ICD-10-CM | POA: Diagnosis not present

## 2018-03-27 HISTORY — DX: Malignant (primary) neoplasm, unspecified: C80.1

## 2018-03-27 LAB — I-STAT ARTERIAL BLOOD GAS, ED
Acid-base deficit: 12 mmol/L — ABNORMAL HIGH (ref 0.0–2.0)
BICARBONATE: 16.4 mmol/L — AB (ref 20.0–28.0)
O2 SAT: 98 %
PCO2 ART: 52.9 mmHg — AB (ref 32.0–48.0)
PH ART: 7.099 — AB (ref 7.350–7.450)
TCO2: 18 mmol/L — AB (ref 22–32)
pO2, Arterial: 142 mmHg — ABNORMAL HIGH (ref 83.0–108.0)

## 2018-03-27 LAB — BASIC METABOLIC PANEL
ANION GAP: 17 — AB (ref 5–15)
BUN: 23 mg/dL (ref 8–23)
CALCIUM: 8.3 mg/dL — AB (ref 8.9–10.3)
CO2: 15 mmol/L — AB (ref 22–32)
CREATININE: 2.55 mg/dL — AB (ref 0.61–1.24)
Chloride: 106 mmol/L (ref 98–111)
GFR calc Af Amer: 26 mL/min — ABNORMAL LOW (ref >60)
GFR, EST NON AFRICAN AMERICAN: 22 mL/min — AB (ref >60)
GLUCOSE: 345 mg/dL — AB (ref 70–99)
Potassium: 4.2 mmol/L (ref 3.5–5.1)
Sodium: 138 mmol/L (ref 135–145)

## 2018-03-27 LAB — PROTIME-INR
INR: 1.98
PROTHROMBIN TIME: 22.2 s — AB (ref 11.4–15.2)

## 2018-03-27 LAB — CBC
HEMATOCRIT: 23.1 % — AB (ref 39.0–52.0)
Hemoglobin: 6.2 g/dL — CL (ref 13.0–17.0)
MCH: 26.1 pg (ref 26.0–34.0)
MCHC: 26.8 g/dL — ABNORMAL LOW (ref 30.0–36.0)
MCV: 97.1 fL (ref 80.0–100.0)
NRBC: 0.5 % — AB (ref 0.0–0.2)
Platelets: 134 10*3/uL — ABNORMAL LOW (ref 150–400)
RBC: 2.38 MIL/uL — ABNORMAL LOW (ref 4.22–5.81)
RDW: 15.9 % — AB (ref 11.5–15.5)
WBC: 7.4 10*3/uL (ref 4.0–10.5)

## 2018-03-27 LAB — APTT: aPTT: 78 seconds — ABNORMAL HIGH (ref 24–36)

## 2018-03-27 LAB — I-STAT CG4 LACTIC ACID, ED: Lactic Acid, Venous: 12.29 mmol/L (ref 0.5–1.9)

## 2018-03-27 LAB — I-STAT TROPONIN, ED: TROPONIN I, POC: 0.23 ng/mL — AB (ref 0.00–0.08)

## 2018-03-27 MED ORDER — PHENYLEPHRINE 40 MCG/ML (10ML) SYRINGE FOR IV PUSH (FOR BLOOD PRESSURE SUPPORT)
PREFILLED_SYRINGE | INTRAVENOUS | Status: AC
  Filled 2018-03-27: qty 10

## 2018-03-27 MED ORDER — EPINEPHRINE PF 1 MG/10ML IJ SOSY
PREFILLED_SYRINGE | INTRAMUSCULAR | Status: AC | PRN
  Administered 2018-03-27: 1 via INTRAVENOUS

## 2018-03-27 MED ORDER — EPINEPHRINE PF 1 MG/ML IJ SOLN
0.5000 ug/min | INTRAVENOUS | Status: DC
  Administered 2018-03-27: 0.5 ug/min via INTRAVENOUS

## 2018-03-27 MED ORDER — TRANEXAMIC ACID 1000 MG/10ML IV SOLN
500.0000 mg | INTRAVENOUS | Status: AC
  Administered 2018-03-27: 5 mg via TOPICAL
  Filled 2018-03-27: qty 10

## 2018-03-27 MED ORDER — NOREPINEPHRINE 4 MG/250ML-% IV SOLN
5.0000 ug/min | INTRAVENOUS | Status: DC
  Administered 2018-03-27: 100 ug/min via INTRAVENOUS
  Administered 2018-03-27: 10 ug/min via INTRAVENOUS
  Filled 2018-03-27: qty 250

## 2018-03-27 MED ORDER — EPINEPHRINE PF 1 MG/10ML IJ SOSY
PREFILLED_SYRINGE | INTRAMUSCULAR | Status: AC | PRN
  Administered 2018-03-27: 1 via INTRAVENOUS
  Administered 2018-03-27: 0.5 mg via INTRAVENOUS
  Administered 2018-03-27 (×5): 1 via INTRAVENOUS

## 2018-03-27 MED ORDER — SODIUM BICARBONATE 8.4 % IV SOLN
INTRAVENOUS | Status: AC | PRN
  Administered 2018-03-27: 50 meq via INTRAVENOUS

## 2018-03-27 MED ORDER — EPINEPHRINE PF 1 MG/10ML IJ SOSY
PREFILLED_SYRINGE | INTRAMUSCULAR | Status: AC | PRN
  Administered 2018-03-27 (×2): 1 mg via INTRAVENOUS

## 2018-03-27 MED ORDER — PHENYLEPHRINE HCL 10 MG/ML IJ SOLN
INTRAMUSCULAR | Status: AC | PRN
  Administered 2018-03-27: 20 ug

## 2018-03-27 MED ORDER — ATROPINE SULFATE 1 MG/ML IJ SOLN
INTRAMUSCULAR | Status: AC | PRN
  Administered 2018-03-27: .5 mg via INTRAVENOUS

## 2018-03-27 MED ORDER — SODIUM CHLORIDE 0.9 % IV SOLN
INTRAVENOUS | Status: DC
  Administered 2018-03-27: 22:00:00 via INTRAVENOUS

## 2018-03-27 MED ORDER — SODIUM CHLORIDE 0.9 % IV SOLN
INTRAVENOUS | Status: AC | PRN
  Administered 2018-03-27: 100 mL/h via INTRAVENOUS

## 2018-03-27 NOTE — ED Notes (Signed)
Ice packs held per MD until CCM evaluates

## 2018-03-27 NOTE — Code Documentation (Signed)
Family at beside. Family given emotional support. 

## 2018-03-27 NOTE — ED Notes (Signed)
EDP speaking with CCM on phone

## 2018-03-27 NOTE — ED Notes (Signed)
Dr Tyrone Nine informed of lactic acid results 12.29

## 2018-03-27 NOTE — Code Documentation (Signed)
ENT paged; continuous blood suctioned from mouth

## 2018-03-27 NOTE — Consult Note (Signed)
..   NAME:  Eric Long, MRN:  [id number], DOB:  11-Jul-1940, LOS: 0 ADMISSION DATE:  04/01/2018, CONSULTATION DATE:  03/14/2018 REFERRING MD:  MCNEILL MD, CHIEF COMPLAINT:  S/p cardiac arrest   Brief History   77 yr old male with PMHx of high-grade left carotid stenosis s/p left endarterectomy  s/p Right ischemic MCA CVA, IDDM, HLD, HTN, CKD stage III, former smoker, presented to [facility] s/p cardiac arrest  >30 mins of downtime. PCCM consulted for admission  History of present illness   Per ED notes and EMS 77 yr old male with PMHx detailed below presenting from home by EMS found face down beside of bed by wife. Per EMS, pt was down ~36mins before being found by wife. Fire started CPR; initial rhythm asystole. EMS initially reports 25 mins of CPR (started at 2027), attempted intubation without success due to large amounts of emesis (854ml  Bloody emesis suctioned), cricotomy done in the field with 6.0 tube with ROSC then re-arrested during transport CPR. ROSC achieved again prior to arrival at 2124.  Large lac to tongue   Shortly after the examination of the patient and discussion with family, while preparing admission. Pt expired.  Past Medical History  .Marland Kitchen Active Ambulatory Problems    Diagnosis Date Noted  . Diabetes mellitus, stable ([place]) 02/05/2013  . Chronic kidney disease, stage III (moderate) ([place]) 02/19/2013  . Atherosclerotic peripheral vascular disease ([place]) 02/19/2013  . Pure hypercholesterolemia 02/19/2013  . Acute CVA (cerebrovascular accident) ([place]) 07/21/2017  . Essential hypertension 07/22/2017  . Type 2 diabetes mellitus with renal manifestations ([place]) 07/22/2017  . Hyperlipidemia 07/23/2017  . Diastolic dysfunction   . Acute blood loss anemia   . Acute ischemic right MCA stroke ([place]) 07/26/2017  . Carotid artery stenosis 11/12/2017   Resolved Ambulatory Problems    Diagnosis Date Noted  . Acute encephalopathy    Past Medical History:  Diagnosis Date  . Cancer  ([place])   . Carotid stenosis, asymptomatic, left   . Pneumonia   . Stroke ([place]) 07/21/2017  . Type II diabetes mellitus ([place])      Significant Hospital Events   S/p cardiac arrest  Consults:  ENT 03/23/2018  Procedures:  Endotracheal intubation Repair of tongue laceration   Micro Data:  non   Objective   Blood pressure 102/72, pulse 74, temperature (!) 95 F (35 C), resp. rate (!) 0, height 6' (1.829 m), SpO2 96 %.        Intake/Output Summary (Last 24 hours) at 03/06/2018 2322 Last data filed at 03/18/2018 2316 Gross per 24 hour  Intake 2314 ml  Output 2400 ml  Net -86 ml   There were no vitals filed for this visit.  Examination: General: not responsive HENT: [place] pupils fixed not reactive. Profound amt of bleeding from oropharynx Lungs: coarse breath sounds b/l intubated Cardiovascular: S1 and S2 at time of eval Abdomen: distended tympanic Extremities: pitting edema, cold Neuro: GCS 3   Assessment & Plan:  1. S/p Cardiac arrest- Post resuscitative Care: - TTM considered and will not be initiated. Goal will be normothermia 36 deg C - Previous ECHO in March 2019 showed EF 60% -   65% with G1DD - MAP goal >63mmHg.  2. Acute Hypoxic and Hypercarbic Respiratory failure - endotracheally intubated - continues on mechanical ventilation TV 8cc/kg increased RR to compensate for metabolic acidosis. - 7.099/52/142/16.  3. Acute Encephalopathy s/p cardiac arrest - CTH w/o contrast to evaluate for hypoxic changes - pt has a  h/o previous stroke and is s/p left endarterectomy - Neurochecks Q 2 - hold sedation to assess status  4. Acute on Chronic Kidney Injury. Anion Gap Metabolic Acidosis  - h/o CKD stage III - baseline in Aug 1.88 Creat now 2.55 - avoid nephrotoxic substances - place indwelling foley catheter - unlikely AGMA from DKA, noted IDDM h/o and elevated BG >300 will check beta hydroxy but start SSI with goal BG 140-180mg /dl for now - most likely AG is  secondary to profound lactic acidosis >12 secondary to hypoperfusion from prolonged coding. Given 2 amps of bicarb  5. Anemia - new onset baseline Hgb range between 12-10 - Hgb on presentation 6.2. - pt received 8 u prbcs + 4 ffps and no platelets Due to significant bleeding from tongue laceration. Cricotomy was repaired by ENT  Shortly after the examination of the patient and assessment I had an extensive discussion with family and they would like comfort measures and withdrawal post admission to ICU.  However while in the process of admission the  Pt expired  Best practice:  Diet: NPO Pain/Anxiety/Delirium protocol (if indicated): not required VAP protocol (if indicated): yes DVT prophylaxis: TXA was given no AC GI prophylaxis: PPI Glucose control: SSI Mobility: Bedrest Code Status: DNR Family Communication: discussed with family regarding goals of care and likelihood of survival and poor prognosis and wife children and friend agree that withdrawal of care to comfort is what patient would want. He apparently had advanced directives. Prior to admission and initiation of comfort measures. Pt expired. DC summary, and associated paperwork and documentation is being handled by EDP. We appreciate being involved in this patient's care and helping family during this difficult time,. Disposition: deceased  Labs   CBC: Recent Labs  Lab 03/21/2018 2150  WBC 7.4  HGB 6.2*  HCT 23.1*  MCV 97.1  PLT 134*    Basic Metabolic Panel: Recent Labs  Lab 03/21/2018 2150  NA 138  K 4.2  CL 106  CO2 15*  GLUCOSE 345*  BUN 23  CREATININE 2.55*  CALCIUM 8.3*   GFR: Estimated Creatinine Clearance: 28.9 mL/min (A) (by C-G formula based on SCr of 2.55 mg/dL (H)). Recent Labs  Lab 03/25/2018 2150 03/19/2018 2157  WBC 7.4  --   LATICACIDVEN  --  12.29*    Liver Function Tests: No results for input(s): AST, ALT, ALKPHOS, BILITOT, PROT, ALBUMIN in the last 168 hours. No results for input(s):  LIPASE, AMYLASE in the last 168 hours. No results for input(s): AMMONIA in the last 168 hours.  ABG    Component Value Date/Time   PHART 7.099 (LL) 03/08/2018 2203   PCO2ART 52.9 (H) 03/19/2018 2203   PO2ART 142.0 (H) 03/07/2018 2203   HCO3 16.4 (L) 03/09/2018 2203   TCO2 18 (L) 03/06/2018 2203   ACIDBASEDEF 12.0 (H) 03/23/2018 2203   O2SAT 98.0 03/22/2018 2203     Coagulation Profile: Recent Labs  Lab 03/17/2018 2150  INR 1.98    Cardiac Enzymes: No results for input(s): CKTOTAL, CKMB, CKMBINDEX, TROPONINI in the last 168 hours.  HbA1C: Hgb A1c MFr Bld  Date/Time Value Ref Range Status  12/29/2017 10:04 AM 6.5 4.6 - 6.5 % Final    Comment:    Glycemic Control Guidelines for People with Diabetes:Non Diabetic:  <6%Goal of Therapy: <7%Additional Action Suggested:  >8%   11/04/2017 10:13 AM 6.6 (H) 4.8 - 5.6 % Final    Comment:    (NOTE) Pre diabetes:  5.7%-6.4% Diabetes:              >6.4% Glycemic control for   <7.0% adults with diabetes     CBG: No results for input(s): GLUCAP in the last 168 hours.  Review of Systems:   Marland KitchenMarland KitchenReview of Systems  Unable to perform ROS: Mental status change    Past Medical History  He,  has a past medical history of [facility]), Carotid stenosis, asymptomatic, left, Hyperlipidemia, Pneumonia, Stroke ([place]) (07/21/2017), and Type II diabetes mellitus ([place]).   Surgical History    Past Surgical History:  Procedure Laterality Date  . CAROTID ENDARTERECTOMY Left 11/12/2017  . ENDARTERECTOMY Left 11/12/2017   Procedure: Left Carotid ENDARTERECTOMY;  Surgeon: Rosetta Posner, MD;  Location: [facility];  Service: Vascular;  Laterality: Left;  . FEMORAL-FEMORAL BYPASS GRAFT Bilateral 2007  . PATCH ANGIOPLASTY Left 11/12/2017   Procedure: PATCH ANGIOPLASTY using Xenoxure patch;  Surgeon: Rosetta Posner, MD;  Location: [facility] OR;  Service: Vascular;  Laterality: Left;     Social History   reports that he quit smoking about 12 years ago. His  smoking use included cigarettes. He has a 100.00 pack-year smoking history. He has never used smokeless tobacco. He reports that he does not drink alcohol or use drugs.   Family History   His family history includes Diabetes in his sister.   Allergies No Known Allergies   Home Medications  Prior to Admission medications   Medication Sig Start Date End Date Taking? Authorizing Provider  acetaminophen (TYLENOL) 325 MG tablet Take 1-2 tablets (325-650 mg total) by mouth every 4 (four) hours as needed for mild pain. 08/09/17   Love, Ivan Anchors, PA-C  aspirin EC 325 MG EC tablet Take 1 tablet (325 mg total) by mouth daily. Patient taking differently: Take 325 mg by mouth every evening.  07/26/17   Samuella Cota, MD  benazepril (LOTENSIN) 5 MG tablet TAKE 1 TABLET BY MOUTH ONCE DAILY 09/01/17   Kirsteins, Luanna Salk, MD  clopidogrel (PLAVIX) 75 MG tablet TAKE 1 TABLET BY MOUTH ONCE DAILY 09/01/17   Kirsteins, Luanna Salk, MD  ezetimibe (ZETIA) 10 MG tablet TAKE 1 TABLET BY MOUTH ONCE DAILY 09/01/17   Kirsteins, Luanna Salk, MD  glucose blood (ONETOUCH VERIO) test strip Use to check blood sugar 2 times per day by rotation of meals. Dx code: E11.9 09/10/17   Elayne Snare, MD  glucose blood (ONETOUCH VERIO) test strip Use as instructed to check blood sugar 2 times daily. 09/10/17   Elayne Snare, MD  insulin degludec (TRESIBA FLEXTOUCH) 100 UNIT/ML SOPN FlexTouch Pen Inject 15 Units into the skin every morning.     [provider]  liraglutide (VICTOZA) 18 MG/3ML SOPN INJECT 1.8 MG SUBCUTANEOUSLY ONCE DAILY 12/16/17   Elayne Snare, MD  metFORMIN (GLUCOPHAGE-XR) 750 MG 24 hr tablet Take 1 tablet (750 mg total) by mouth daily with breakfast. 10/30/17   Elayne Snare, MD  metoprolol succinate (TOPROL-XL) 50 MG 24 hr tablet TAKE 1 TABLET BY MOUTH ONCE DAILY AFTER  BREAKFAST 09/01/17   Kirsteins, Luanna Salk, MD  oxyCODONE-acetaminophen (PERCOCET/ROXICET) 5-325 MG tablet Take 1 tablet by mouth every 6 (six) hours as needed  for moderate pain. 11/13/17   Dagoberto Ligas, PA-C  RELION PEN NEEDLES 32G X 4 MM MISC USE ONE PEN NEEDLE TWICE DAILY 06/30/17   Elayne Snare, MD  rosuvastatin (CRESTOR) 40 MG tablet Take 1 tablet (40 mg total) by mouth daily. 08/09/17   Bary Leriche, PA-C  Critical care time: 45 mins

## 2018-03-27 NOTE — ED Notes (Signed)
Continuous bleeding from crich noted

## 2018-03-27 NOTE — ED Notes (Signed)
Dr Tyrone Nine informed of troponin results .[place]

## 2018-03-27 NOTE — ED Triage Notes (Signed)
Pt from home by EMS found face down beside of bed by wife. Per EMS, pt was down ~105mins before being found by wife. Fire started CPR; initial rhythm asystole. EMS initially reports 25 mins of CPR (started at 2027), attempted intubation without success due to large amounts of emesis (872ml  Bloody emesis suctioned), cricotomy done in the field with 6.0 tube with ROSC then re-arrested during transport CPR. ROSC achieved again prior to arrival at 2124.  Large lac to tongue

## 2018-03-27 NOTE — Code Documentation (Signed)
ENT at bedside

## 2018-03-28 DIAGNOSIS — I469 Cardiac arrest, cause unspecified: Secondary | ICD-10-CM | POA: Diagnosis present

## 2018-03-28 LAB — PREPARE FRESH FROZEN PLASMA
Unit division: 0
Unit division: 0

## 2018-03-28 LAB — BPAM FFP
BLOOD PRODUCT EXPIRATION DATE: 201911232359
Blood Product Expiration Date: 201911232359
Blood Product Expiration Date: 201911232359
Blood Product Expiration Date: 201911242359
ISSUE DATE / TIME: 201911222219
ISSUE DATE / TIME: 201911222219
ISSUE DATE / TIME: 201911222333
ISSUE DATE / TIME: 201911222333
UNIT TYPE AND RH: 6200
UNIT TYPE AND RH: 6200
Unit Type and Rh: 6200
Unit Type and Rh: 6200

## 2018-03-28 MED ORDER — MORPHINE 100MG IN NS 100ML (1MG/ML) PREMIX INFUSION
5.0000 mg/h | INTRAVENOUS | Status: DC
  Filled 2018-03-28: qty 100

## 2018-03-30 LAB — TYPE AND SCREEN
ABO/RH(D): O POS
Antibody Screen: NEGATIVE
UNIT DIVISION: 0
UNIT DIVISION: 0
UNIT DIVISION: 0
UNIT DIVISION: 0
Unit division: 0
Unit division: 0
Unit division: 0
Unit division: 0
Unit division: 0
Unit division: 0

## 2018-03-30 LAB — BPAM RBC
BLOOD PRODUCT EXPIRATION DATE: 201912202359
BLOOD PRODUCT EXPIRATION DATE: 201912202359
Blood Product Expiration Date: 201912202359
Blood Product Expiration Date: 201912202359
Blood Product Expiration Date: 201912202359
Blood Product Expiration Date: 201912202359
Blood Product Expiration Date: 201912202359
Blood Product Expiration Date: 201912202359
Blood Product Expiration Date: 201912202359
Blood Product Expiration Date: 201912202359
ISSUE DATE / TIME: 201911222219
ISSUE DATE / TIME: 201911222235
ISSUE DATE / TIME: 201911222335
ISSUE DATE / TIME: 201911222203
ISSUE DATE / TIME: 201911222203
ISSUE DATE / TIME: 201911222219
ISSUE DATE / TIME: 201911222235
ISSUE DATE / TIME: 201911222335
UNIT TYPE AND RH: 5100
UNIT TYPE AND RH: 5100
UNIT TYPE AND RH: 5100
UNIT TYPE AND RH: 5100
UNIT TYPE AND RH: 5100
Unit Type and Rh: 5100
Unit Type and Rh: 5100
Unit Type and Rh: 5100
Unit Type and Rh: 5100
Unit Type and Rh: 5100

## 2018-03-30 MED FILL — Medication: Qty: 1 | Status: AC

## 2018-04-05 NOTE — ED Notes (Signed)
Time of Death 0014; called by Dr.Sarah Fabio Bering and Deneise Lever RN; family remains at bedside

## 2018-04-05 NOTE — Progress Notes (Signed)
Late note: Pt arrived post CPR via EMS. Pt had a trach placed in the ambulance in route to hospital. ED MD had RT help replace the trach with a size 7 ETT. Pt trach site bleeding a lot. Pt has bilat breath sounds after oral ETT placement. Place on vent with setting based on pt 8cc (620/15/+5/100%). RT will continue to monitor pt.

## 2018-04-05 NOTE — Consult Note (Signed)
[place]  Referring Physician: Ocie Bob, MD Primary Care Physician: Cari Caraway, MD Patient Location at Initial Consult: Emergency Department Chief Complaint/Reason for Consult: anterior neck hemorrhage  History of Presenting Illness:  History obtained from ER staff Eric Long is a  77 y.o. male presenting with hemorrhage from the anterior neck.  The patient was found down in his home and had CPR initiated by EMS.  Multiple attempts of intubation by EMS failed.  Reportedly he was found down with bloody emesis.  A cricothyroidotomy was done in the field with a 6-0 tube.  Upon arrival to the emergency department there was question of possible prior bloody emesis and possible prior laceration to his tongue.  Using a glide scope the ER staff was able to get the patient intubated from above.  The cricothyroidotomy tube was then removed.  He continued to have significant hemorrhage from the cricothyroid site.  Estimated that he lost at least 2 L of blood from the site.  Also he has had multiple episodes of losing his pulse again and multiple rounds of CPR again performed.  Past Medical History:  Diagnosis Date  . Cancer ([place])   . Carotid stenosis, asymptomatic, left   . Hyperlipidemia   . Pneumonia    "twice" (11/12/2017)  . Stroke ([place]) 07/21/2017   "left side weaker since" (11/12/2017)  . Type II diabetes mellitus ([place])     Past Surgical History:  Procedure Laterality Date  . CAROTID ENDARTERECTOMY Left 11/12/2017  . ENDARTERECTOMY Left 11/12/2017   Procedure: Left Carotid ENDARTERECTOMY;  Surgeon: Rosetta Posner, MD;  Location: [place];  Service: Vascular;  Laterality: Left;  . FEMORAL-FEMORAL BYPASS GRAFT Bilateral 2007  . PATCH ANGIOPLASTY Left 11/12/2017   Procedure: PATCH ANGIOPLASTY using Xenoxure patch;  Surgeon: Rosetta Posner, MD;  [facility];  Service: Vascular;  Laterality: Left;    Family History  Problem Relation  Age of Onset  . Diabetes Sister     Social History   Socioeconomic History  . Marital status: Married    Spouse name: Not on file  . Number of children: Not on file  . Years of education: Not on file  . Highest education level: Not on file  Occupational History  . Not on file  Social Needs  . Financial resource strain: Not on file  . Food insecurity:    Worry: Not on file    Inability: Not on file  . Transportation needs:    Medical: Not on file    Non-medical: Not on file  Tobacco Use  . Smoking status: Former Smoker    Packs/day: 2.00    Years: 50.00    Pack years: 100.00    Types: Cigarettes    Last attempt to quit: 2007    Years since quitting: 12.9  . Smokeless tobacco: Never Used  Substance and Sexual Activity  . Alcohol use: Never    Frequency: Never  . Drug use: Never  . Sexual activity: Yes  Lifestyle  . Physical activity:    Days per week: Not on file    Minutes per session: Not on file  . Stress: Not on file  Relationships  . Social connections:    Talks on phone: Not on file    Gets together: Not on file    Attends religious service: Not on file    Active member of club or organization: Not on file    Attends meetings of clubs  or organizations: Not on file    Relationship status: Not on file  Other Topics Concern  . Not on file  Social History Narrative  . Not on file    No current facility-administered medications on file prior to encounter.    Current Outpatient Medications on File Prior to Encounter  Medication Sig Dispense Refill  . acetaminophen (TYLENOL) 325 MG tablet Take 1-2 tablets (325-650 mg total) by mouth every 4 (four) hours as needed for mild pain.    Marland Kitchen aspirin EC 325 MG EC tablet Take 1 tablet (325 mg total) by mouth daily. (Patient taking differently: Take 325 mg by mouth every evening. ) 30 tablet 0  . benazepril (LOTENSIN) 5 MG tablet TAKE 1 TABLET BY MOUTH ONCE DAILY 30 tablet 0  . clopidogrel (PLAVIX) 75 MG tablet TAKE 1  TABLET BY MOUTH ONCE DAILY 30 tablet 0  . ezetimibe (ZETIA) 10 MG tablet TAKE 1 TABLET BY MOUTH ONCE DAILY 30 tablet 0  . glucose blood (ONETOUCH VERIO) test strip Use to check blood sugar 2 times per day by rotation of meals. Dx code: E11.9 100 each 3  . glucose blood (ONETOUCH VERIO) test strip Use as instructed to check blood sugar 2 times daily. 100 each 12  . insulin degludec (TRESIBA FLEXTOUCH) 100 UNIT/ML SOPN FlexTouch Pen Inject 15 Units into the skin every morning.     . liraglutide (VICTOZA) 18 MG/3ML SOPN INJECT 1.8 MG SUBCUTANEOUSLY ONCE DAILY 9 pen 3  . metFORMIN (GLUCOPHAGE-XR) 750 MG 24 hr tablet Take 1 tablet (750 mg total) by mouth daily with breakfast. 90 tablet 1  . metoprolol succinate (TOPROL-XL) 50 MG 24 hr tablet TAKE 1 TABLET BY MOUTH ONCE DAILY AFTER  BREAKFAST 30 tablet 0  . oxyCODONE-acetaminophen (PERCOCET/ROXICET) 5-325 MG tablet Take 1 tablet by mouth every 6 (six) hours as needed for moderate pain. 10 tablet 0  . RELION PEN NEEDLES 32G X 4 MM MISC USE ONE PEN NEEDLE TWICE DAILY 200 each 2  . rosuvastatin (CRESTOR) 40 MG tablet Take 1 tablet (40 mg total) by mouth daily. 30 tablet 0    No Known Allergies   Review of Systems: Unable to complete due to mental status.  Patient intubated and chest compressions being done during the consultation   OBJECTIVE: Vital Signs: Vitals:   Apr 04, 2018 0022 04-Apr-2018 0024  BP:    Pulse:    Resp: (!) 0 (!) 0  Temp:    SpO2:      I&O  Intake/Output Summary (Last 24 hours) at 04-04-2018 0114 Last data filed at 03/27/2018 2349 Gross per 24 hour  Intake 4327 ml  Output 3400 ml  Net 927 ml    Physical Exam General:  Diaphoretic, pale in acute distress Chest compressions being done during this consultation  Head/Face: Normocephalic, atraumatic. No scars or lesions. No sinus tenderness. Facial nerve intact and equal bilaterally.   Eyes: Globes well positioned, only reactive pupils Lids: No periorbital edema/ecchymosis.  No lid laceration Conjunctiva: No chemosis, hemorrhage   Ears: No gross deformity.  Hearing:  Unable to discern  Nose: No gross deformity or lesions.  Bilateral epistaxis  Mouth/Oropharynx: Lips without any lesions.  Large blood clot in the posterior oropharynx  Neck: Trachea midline.  cricothyroidotomy incision with slow-appears to be venous- hemorrhage.  Lymphatic: No lymphadenopathy in the neck.  Respiratory: No stridor or distress.  Cardiovascular:  Intermittent asystole along with PEA  Extremities: No edema or cyanosis.  Cold  Skin: No scars or  lesions on face or neck.  Neurologic:  Able to examine  Other:      Labs: Lab Results  Component Value Date   WBC 7.4 03/11/2018   HGB 6.2 (LL) 03/10/2018   HCT 23.1 (L) 03/17/2018   PLT 134 (L) 03/10/2018   CHOL 97 08/19/2017   TRIG 123.0 08/19/2017   HDL 24.20 (L) 08/19/2017   LDLDIRECT 151.0 03/17/2017   ALT 12 12/29/2017   AST 14 12/29/2017   NA 138 03/11/2018   K 4.2 03/19/2018   CL 106 03/22/2018   CREATININE 2.55 (H) 03/30/2018   BUN 23 03/19/2018   CO2 15 (L) 03/16/2018   INR 1.98 03/27/2018   HGBA1C 6.5 12/29/2017   MICROALBUR 3.3 (H) 12/29/2017   Procedure: Control of anterior neck hemorrhage Details: The cricothyroidotomy site was visualized and multiple attempts at clamping any bleeding vessels were made.  Ultimately a single vessel was not able to be identified.  After significant amount of time of trying to pinpoint any particular bleeding vessel with clamps, ligation with small 3-0 sutures, the decision was made to pack the wound.  Surgicel was used to pack the cricothyroidotomy wound.  This seemed to be effective.  However after this the patient continued to have bilateral epistaxis and pooling of the blood in the posterior oropharynx.  ASSESSMENT:  77 y.o. male with anterior neck hemorrhage from cricothyroidotomy site in the setting of multiple rounds of CPR.  RECOMMENDATIONS: The wound bed was packed and  anterior neck hemorrhage partially controlled.  However, the patient continued to have a slow ooze from the bilateral nares and posterior oropharynx.  He also had multiple rounds of CPR and upon re-arresting a number of times, the family decided to not perform CPR.  The patient passed away.    Gavin Pound, MD  [facility] [place] Office phone [phone]

## 2018-04-05 NOTE — ED Provider Notes (Signed)
[facility] EMERGENCY DEPARTMENT Provider Note   CSN: [id number] Arrival date & time: 03/10/2018  2136     History   Chief Complaint Chief Complaint  Patient presents with  . Cardiac Arrest    HPI Eric Long is a 77 y.o. male.  The history is provided by the EMS personnel. No language interpreter was used.  Cardiac Arrest  Witnessed by:  Family member Incident location:  Home Time before BLS initiated:  Immediate Time before ALS initiated:  Immediate Condition upon EMS arrival:  Apneic Pulse:  Absent Initial cardiac rhythm per EMS:  Asystole Treatments prior to arrival:  ACLS protocol and intubation Medications given prior to ED:  Epinephrine Airway:  Cricothyrotomy Rhythm on admission to ED:  Sinus tachycardia   Past Medical History:  Diagnosis Date  . Cancer ([place])   . Carotid stenosis, asymptomatic, left   . Hyperlipidemia   . Pneumonia    "twice" (11/12/2017)  . Stroke ([place]) 07/21/2017   "left side weaker since" (11/12/2017)  . Type II diabetes mellitus [facility])     Patient Active Problem List   Diagnosis Date Noted  . Carotid artery stenosis 11/12/2017  . Acute ischemic right MCA stroke ([place]) 07/26/2017  . Hyperlipidemia 07/23/2017  . Diastolic dysfunction   . Acute blood loss anemia   . Essential hypertension 07/22/2017  . Type 2 diabetes mellitus with renal manifestations ([place]) 07/22/2017  . Acute CVA (cerebrovascular accident) ([place]) 07/21/2017  . Chronic kidney disease, stage III (moderate) ([place]) 02/19/2013  . Atherosclerotic peripheral vascular disease ([place]) 02/19/2013  . Pure hypercholesterolemia 02/19/2013  . Diabetes mellitus, stable ([place]) 02/05/2013    Past Surgical History:  Procedure Laterality Date  . CAROTID ENDARTERECTOMY Left 11/12/2017  . ENDARTERECTOMY Left 11/12/2017   Procedure: Left Carotid ENDARTERECTOMY;  Surgeon: Rosetta Posner, MD;  Location: [place];  Service: Vascular;  Laterality: Left;  . FEMORAL-FEMORAL  BYPASS GRAFT Bilateral 2007  . PATCH ANGIOPLASTY Left 11/12/2017   Procedure: PATCH ANGIOPLASTY using Xenoxure patch;  Surgeon: Rosetta Posner, MD;  Location: [facility] OR;  Service: Vascular;  Laterality: Left;        Home Medications    Prior to Admission medications   Medication Sig Start Date End Date Taking? Authorizing Provider  acetaminophen (TYLENOL) 325 MG tablet Take 1-2 tablets (325-650 mg total) by mouth every 4 (four) hours as needed for mild pain. 08/09/17   Love, Ivan Anchors, PA-C  aspirin EC 325 MG EC tablet Take 1 tablet (325 mg total) by mouth daily. Patient taking differently: Take 325 mg by mouth every evening.  07/26/17   Samuella Cota, MD  benazepril (LOTENSIN) 5 MG tablet TAKE 1 TABLET BY MOUTH ONCE DAILY 09/01/17   Kirsteins, Luanna Salk, MD  clopidogrel (PLAVIX) 75 MG tablet TAKE 1 TABLET BY MOUTH ONCE DAILY 09/01/17   Kirsteins, Luanna Salk, MD  ezetimibe (ZETIA) 10 MG tablet TAKE 1 TABLET BY MOUTH ONCE DAILY 09/01/17   Kirsteins, Luanna Salk, MD  glucose blood (ONETOUCH VERIO) test strip Use to check blood sugar 2 times per day by rotation of meals. Dx code: E11.9 09/10/17   Elayne Snare, MD  glucose blood (ONETOUCH VERIO) test strip Use as instructed to check blood sugar 2 times daily. 09/10/17   Elayne Snare, MD  insulin degludec (TRESIBA FLEXTOUCH) 100 UNIT/ML SOPN FlexTouch Pen Inject 15 Units into the skin every morning.     [provider]  liraglutide (VICTOZA) 18 MG/3ML SOPN INJECT 1.8 MG SUBCUTANEOUSLY  ONCE DAILY 12/16/17   Elayne Snare, MD  metFORMIN (GLUCOPHAGE-XR) 750 MG 24 hr tablet Take 1 tablet (750 mg total) by mouth daily with breakfast. 10/30/17   Elayne Snare, MD  metoprolol succinate (TOPROL-XL) 50 MG 24 hr tablet TAKE 1 TABLET BY MOUTH ONCE DAILY AFTER  BREAKFAST 09/01/17   Kirsteins, Luanna Salk, MD  oxyCODONE-acetaminophen (PERCOCET/ROXICET) 5-325 MG tablet Take 1 tablet by mouth every 6 (six) hours as needed for moderate pain. 11/13/17   Dagoberto Ligas, PA-C  RELION  PEN NEEDLES 32G X 4 MM MISC USE ONE PEN NEEDLE TWICE DAILY 06/30/17   Elayne Snare, MD  rosuvastatin (CRESTOR) 40 MG tablet Take 1 tablet (40 mg total) by mouth daily. 08/09/17   Bary Leriche, PA-C    Family History Family History  Problem Relation Age of Onset  . Diabetes Sister     Social History Social History   Tobacco Use  . Smoking status: Former Smoker    Packs/day: 2.00    Years: 50.00    Pack years: 100.00    Types: Cigarettes    Last attempt to quit: 2007    Years since quitting: 12.9  . Smokeless tobacco: Never Used  Substance Use Topics  . Alcohol use: Never    Frequency: Never  . Drug use: Never     Allergies   Patient has no known allergies.   Review of Systems Review of Systems  Unable to perform ROS: Acuity of condition     Physical Exam Updated Vital Signs BP (!) 82/60   Pulse (!) 26   Temp (!) 95 F (35 C)   Resp (!) 0   Ht 6' (1.829 m)   SpO2 (!) 54%   BMI 30.24 kg/m   Physical Exam  Constitutional: He appears well-developed and well-nourished. He is intubated.  HENT:  Head:    Nose: Epistaxis is observed.  Large incision on anterior side of neck - cric tube in place, bleeding from L side Bleeding from oropharynx  Eyes:  Fixed and dilated  Cardiovascular: Regular rhythm. Tachycardia present. Exam reveals decreased pulses.  Pulses:      Femoral pulses are 2+ on the right side, and 2+ on the left side. Pulmonary/Chest: He is intubated. He has rales in the right lower field and the left lower field.  Abdominal: He exhibits distension.  Vitals reviewed.    ED Treatments / Results  Labs (all labs ordered are listed, but only abnormal results are displayed) Labs Reviewed  BASIC METABOLIC PANEL - Abnormal; Notable for the following components:      Result Value   CO2 15 (*)    Glucose, Bld 345 (*)    Creatinine, Ser 2.55 (*)    Calcium 8.3 (*)    GFR calc non Af Amer 22 (*)    GFR calc Af Amer 26 (*)    Anion gap 17 (*)     All other components within normal limits  CBC - Abnormal; Notable for the following components:   RBC 2.38 (*)    Hemoglobin 6.2 (*)    HCT 23.1 (*)    MCHC 26.8 (*)    RDW 15.9 (*)    Platelets 134 (*)    nRBC 0.5 (*)    All other components within normal limits  APTT - Abnormal; Notable for the following components:   aPTT 78 (*)    All other components within normal limits  PROTIME-INR - Abnormal; Notable for the following components:  Prothrombin Time 22.2 (*)    All other components within normal limits  I-STAT CG4 LACTIC ACID, ED - Abnormal; Notable for the following components:   Lactic Acid, Venous 12.29 (*)    All other components within normal limits  I-STAT TROPONIN, ED - Abnormal; Notable for the following components:   Troponin i, poc 0.23 (*)    All other components within normal limits  I-STAT ARTERIAL BLOOD GAS, ED - Abnormal; Notable for the following components:   pH, Arterial 7.099 (*)    pCO2 arterial 52.9 (*)    pO2, Arterial 142.0 (*)    Bicarbonate 16.4 (*)    TCO2 18 (*)    Acid-base deficit 12.0 (*)    All other components within normal limits  BLOOD GAS, ARTERIAL  CBG MONITORING, ED  TYPE AND SCREEN  PREPARE FRESH FROZEN PLASMA    EKG EKG Interpretation  Date/Time:  Friday March 27 2018 21:51:02 EST Ventricular Rate:  128 PR Interval:    QRS Duration: 159 QT Interval:  396 QTC Calculation: 578 R Axis:   109 Text Interpretation:  Junctional tachycardia Right bundle branch block st depression in the anterior leads Otherwise no significant change Confirmed by Deno Etienne [phone]) on 03/08/2018 11:48:26 PM   Radiology Dg Chest Port 1 View  Result Date: 03/22/2018 CLINICAL DATA:  Cardiac arrest. EXAM: PORTABLE CHEST 1 VIEW COMPARISON:  Chest x-ray dated 07/21/2017. FINDINGS: Endotracheal tube appears well positioned with tip above the level of the carina. Possible overinflation of the balloon at the distal margin of the endotracheal tube.  Enteric tube passes below the diaphragm. Diffuse opacities throughout the RIGHT lung, most dense/confluent within the RIGHT perihilar lung and RIGHT lower lung. Additional streaky opacities within the LEFT perihilar lung and LEFT upper lobe. Probable small RIGHT pleural effusion. Subcutaneous emphysema overlying the RIGHT lateral chest wall and RIGHT lower neck. IMPRESSION: 1. Subcutaneous emphysema overlying the RIGHT lateral chest wall and RIGHT lower neck. This could indicate underlying pneumothorax or pneumomediastinum, neither of which is clearly delineated. 2. Possible overinflation of the balloon at the distal margin of the endotracheal tube, possible source for the subcutaneous emphysema. 3. Diffuse opacities throughout the RIGHT lung, presumably pulmonary edema. Small RIGHT pleural effusion. 4. Milder pulmonary edema within the LEFT lung. 5. No rib fracture or dislocation seen. Electronically Signed   By: Franki Cabot M.D.   On: 03/11/2018 23:58    Procedures Procedure Name: Intubation Date/Time: Apr 20, 2018 12:33 AM Performed by: Erskine Squibb, MD Pre-anesthesia Checklist: Suction available, Emergency Drugs available and Patient being monitored Laryngoscope Size: Glidescope Grade View: Grade I Tube type: Subglottic suction tube Tube size: 7.0 mm Number of attempts: 1 Airway Equipment and Method: Video-laryngoscopy Placement Confirmation: ETT inserted through vocal cords under direct vision and Breath sounds checked- equal and bilateral Secured at: 21 cm Tube secured with: ETT holder      (including critical care time)  Medications Ordered in ED Medications  norepinephrine (LEVOPHED) 4mg  in D5W 23mL premix infusion (100 mcg/min Intravenous New Bag/Given 03/13/2018 2321)  0.9 %  sodium chloride infusion ( Intravenous New Bag/Given 04/04/2018 2158)  phenylephrine 0.4-0.9 MG/10ML-% injection (has no administration in time range)  EPINEPHrine (ADRENALIN) 4 mg in dextrose 5 % 250 mL  (0.016 mg/mL) infusion (1 mcg/min Intravenous Rate/Dose Change 03/31/2018 2352)  phenylephrine 0.4-0.9 MG/10ML-% injection (has no administration in time range)  EPINEPHrine (ADRENALIN) 1 MG/10ML injection (1 mg Intravenous Given 03/09/2018 2141)  0.9 %  sodium chloride infusion ( Intravenous  Stopped 03/23/2018 2345)  phenylephrine (NEO-SYNEPHRINE) injection (20 mcg  Given 04/01/2018 2159)  atropine injection (0.5 mg Intravenous Given 04/01/2018 2137)  EPINEPHrine (ADRENALIN) 1 MG/10ML injection (1 Syringe Intravenous Given 03/13/2018 2144)  sodium bicarbonate injection (50 mEq Intravenous Given 03/12/2018 2145)  tranexamic acid (CYKLOKAPRON) injection 500 mg (5 mg Topical Given 04/03/2018 2222)  EPINEPHrine (ADRENALIN) 1 MG/10ML injection (0.5 mg Intravenous Given 03/21/2018 2257)  sodium bicarbonate injection (50 mEq Intravenous Given 03/26/2018 2230)     Initial Impression / Assessment and Plan / ED Course  I have reviewed the triage vital signs and the nursing notes.  Pertinent labs & imaging results that were available during my care of the patient were reviewed by me and considered in my medical decision making (see chart for details).     Patient is a 77 y.o. male with PMHx of vascular disease presenting for witnessed cardiac arrest. Upon arrival, patient had bilateral breath sounds, distention in abdomen with femoral pulse.  Patient was given 1L LR bolus, pads were placed on patient. PIV were placed. Cricothyrotomy site was explored and patient was having significant bleeding from L side of neck and pooling of blood in oropharynx. Copious blood was noted and patient was given 2u pRBC.  Patient lost pulses, CPR was initiated, multiple additional doses was epi were given. Patient was started on levophed gtt and additional fluid bolus was given.  Unable to stop bleeding with TXA or direct pressure. Patient was intubated orally successfully and cricothyrotomy was removed.  ENT was consulted and came to  see patient at the bedside for closure of cricothyrotomy site. Critical care was consulted for admission. Family discussion was had at bedside with ED provider. Advance directive from patient during previously hospitalization was found by the nurse. Family would like to honor patient's wishes and no excalate care at this time. Plan was made that if patient were to lose pulses again, family wished that CPR would not be restarted.  Patient's HR became slower and asystole was seen on the monitor. Time of death was 0014, pronounced by ED provider. Family was at bedside and made aware.  Medical examiner was consulted and discussed case by phone, no indication for ME case at this time.     Final Clinical Impressions(s) / ED Diagnoses   Final diagnoses:  Cardiac arrest [facility])    ED Discharge Orders    None       Erskine Squibb, MD 2018-04-12 0038    Deno Etienne, DO 2018-04-12 1625

## 2018-04-05 NOTE — Progress Notes (Signed)
I responded to a page from the ED to provide spiritual support for the family of a CPR patient. I visited the family in the Consult Room and provided spiritual support through pastoral presence and by leading in prayer. I remained with the family while the physicians gave updates on the patient's status. I remained with the family, providing spiritual support, in the trauma room until the patient expired. I shared words of comfort. I provided next of kin and funeral home information to the nurse.    04/06/2018 0000  Clinical Encounter Type  Visited With Patient and family together  Visit Type Spiritual support;ED;Death  Referral From Nurse  Consult/Referral To Chaplain  Spiritual Encounters  Spiritual Needs Grief support;Prayer;Emotional    Chaplain Dr Redgie Grayer

## 2018-04-05 DEATH — deceased

## 2018-04-06 ENCOUNTER — Other Ambulatory Visit: Payer: Medicare HMO

## 2018-04-08 ENCOUNTER — Ambulatory Visit: Payer: Medicare HMO | Admitting: Endocrinology

## 2018-04-24 ENCOUNTER — Other Ambulatory Visit: Payer: Medicare HMO

## 2018-04-24 ENCOUNTER — Ambulatory Visit: Payer: Medicare HMO | Admitting: Vascular Surgery

## 2018-05-07 ENCOUNTER — Encounter: Payer: Medicare HMO | Admitting: Psychology

## 2020-01-27 IMAGING — MR MR HEAD W/O CM
12 of 13 series · 44 of 48 positions shown · non-contrast
Comparison: Head CT 07/21/2017

CLINICAL DATA: CTA.  Loss of balance.  Fall.

EXAM:
MRI HEAD WITHOUT CONTRAST
TECHNIQUE: Multiplanar, multiecho pulse sequences of the brain and surrounding
structures were obtained without intravenous contrast.

[Series 5001: ax dwi_tracew · axial · 3.0mm · 1.50mm/px · z∈[-72,+68]mm · 7 of 80 slices shown]
[im 1/80]
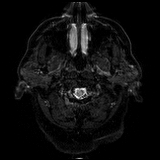
[im 14/80]
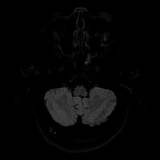
[im 27/80]
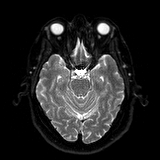
[im 40/80]
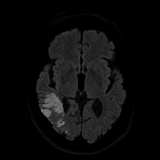
[im 53/80]
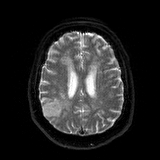
[im 66/80]
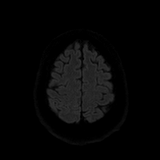
[im 80/80]
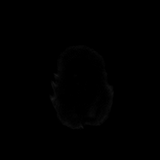

[Series 6001: ax dwi_adc · axial · 3.0mm · 1.50mm/px · z∈[-72,+68]mm · 3 of 40 slices shown]
[im 1/40]
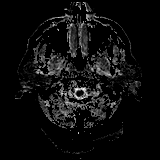
[im 20/40]
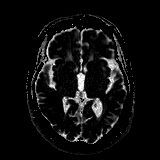
[im 40/40]
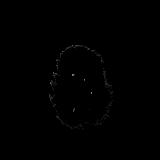

[Series 7001: T1 · sagittal · 5.0mm · 0.81mm/px · 2 of 23 slices shown]
[im 1/23]
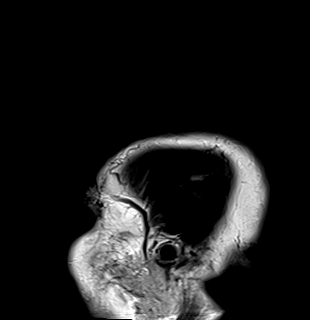
[im 23/23]
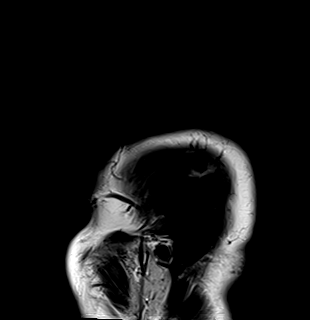

[Series 8001: cor dwi_tracew · coronal · 5.0mm · 1.44mm/px · 5 of 58 slices shown]
[im 1/58]
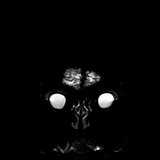
[im 15/58]
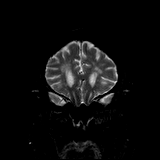
[im 29/58]
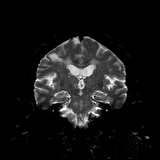
[im 43/58]
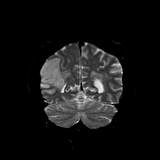
[im 58/58]
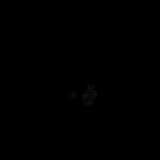

[Series 9001: cor dwi_adc · coronal · 5.0mm · 1.44mm/px · 2 of 29 slices shown]
[im 1/29]
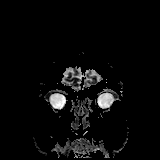
[im 29/29]
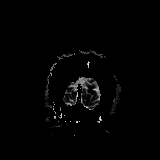

[T2 · axial · 5.0mm · 0.75mm/px · z∈[-81,+61]mm · 2 of 25 slices shown (1 of 2)]
[im 1/25]
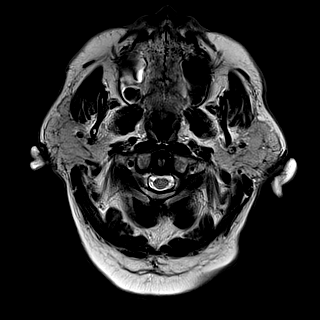
[im 25/25]
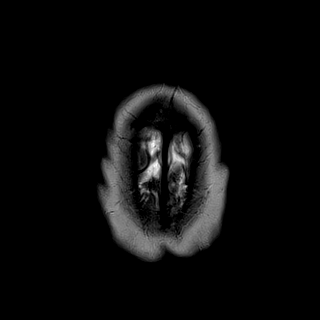

[mag_images · axial · 3.0mm · 0.75mm/px · z∈[-97,+78]mm · 5 of 60 slices shown]
[im 1/60]
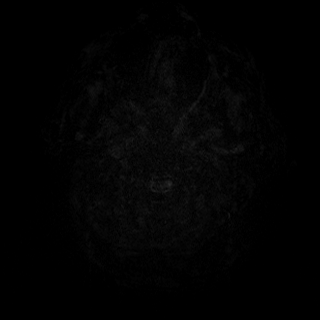
[im 15/60]
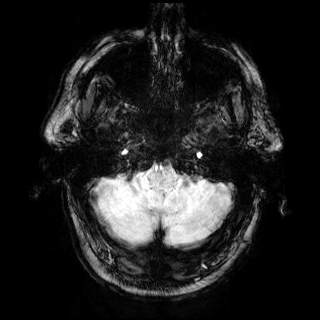
[im 30/60]
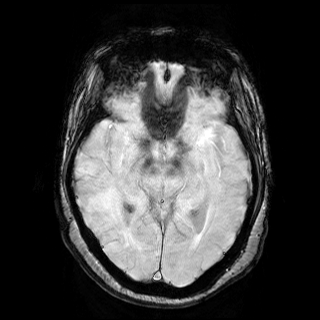
[im 45/60]
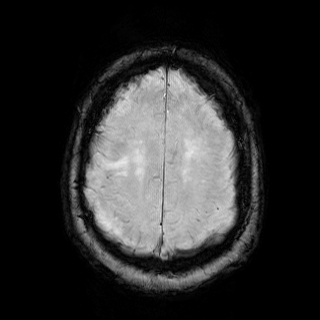
[im 60/60]
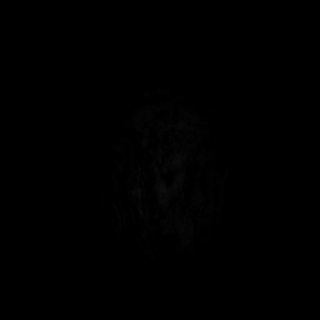

[pha_images · axial · 3.0mm · 0.75mm/px · z∈[-97,+78]mm · 5 of 60 slices shown]
[im 1/60]
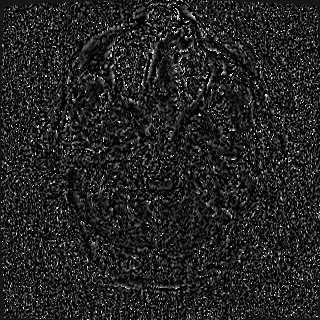
[im 15/60]
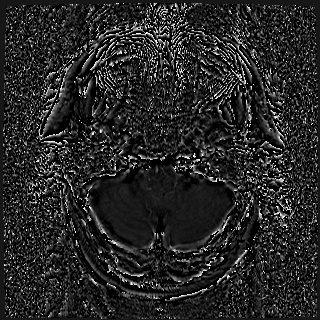
[im 30/60]
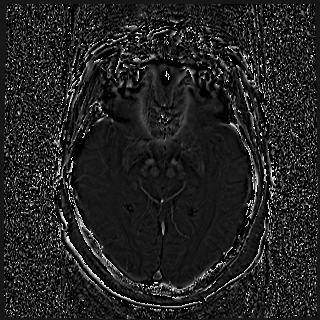
[im 45/60]
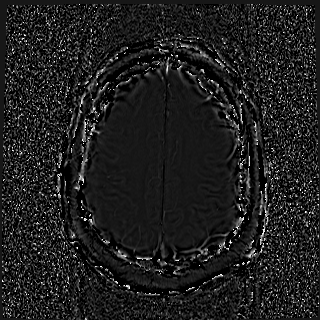
[im 60/60]
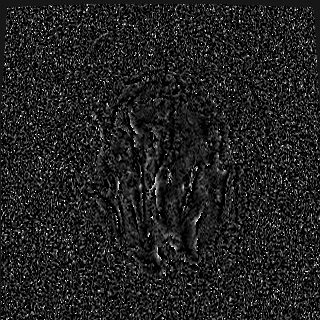

[swi_images · axial · 3.0mm · 0.75mm/px · z∈[-97,+78]mm · 5 of 60 slices shown]
[im 1/60]
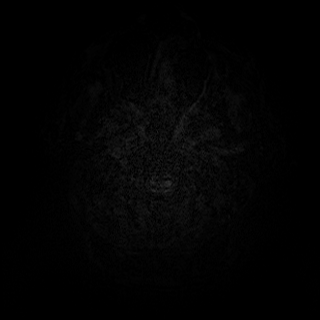
[im 15/60]
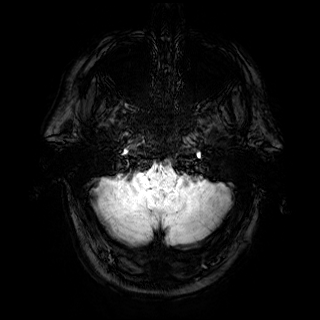
[im 30/60]
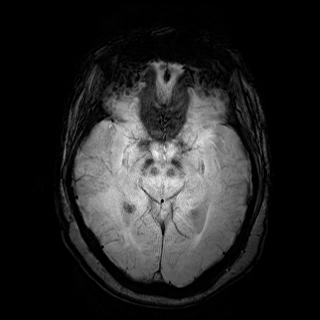
[im 45/60]
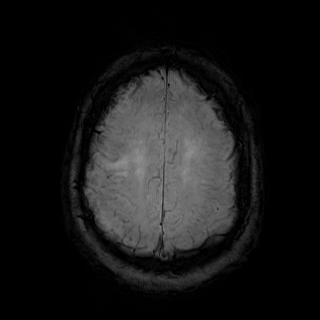
[im 60/60]
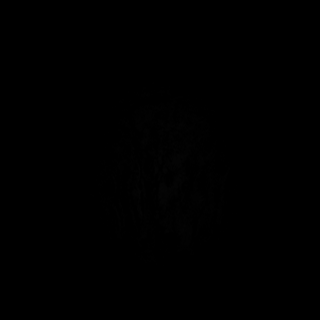

[mip_images(sw) · axial · 24.0mm · 0.75mm/px · z∈[-87,+67]mm · 4 of 53 slices shown]
[im 1/53]
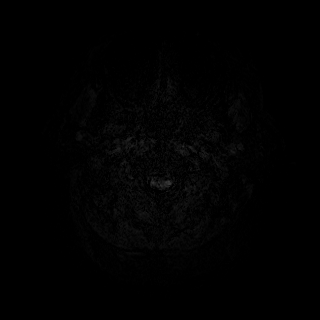
[im 18/53]
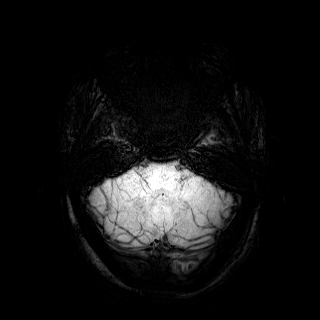
[im 35/53]
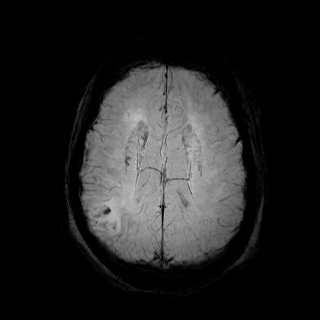
[im 53/53]
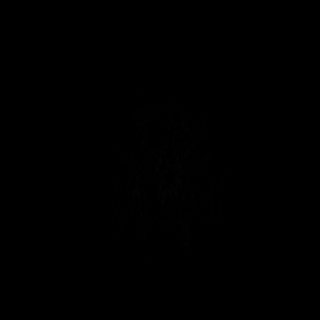

[FLAIR · axial · 5.0mm · 0.38mm/px · z∈[-81,+61]mm · 2 of 25 slices shown]
[im 1/25]
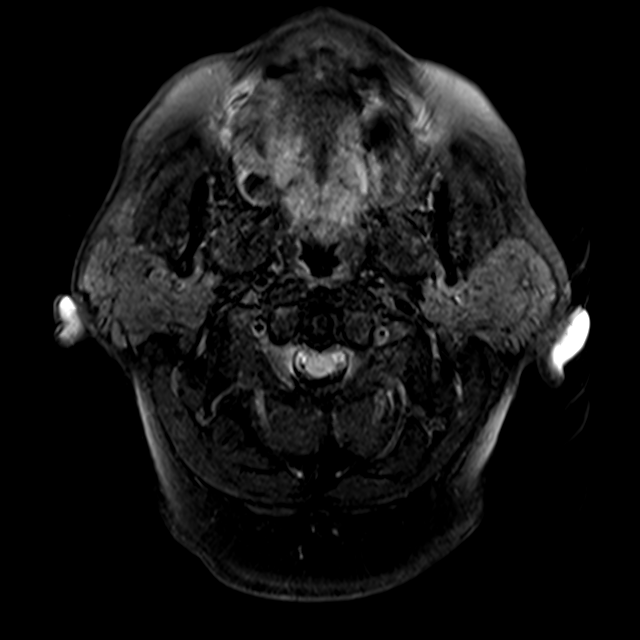
[im 25/25]
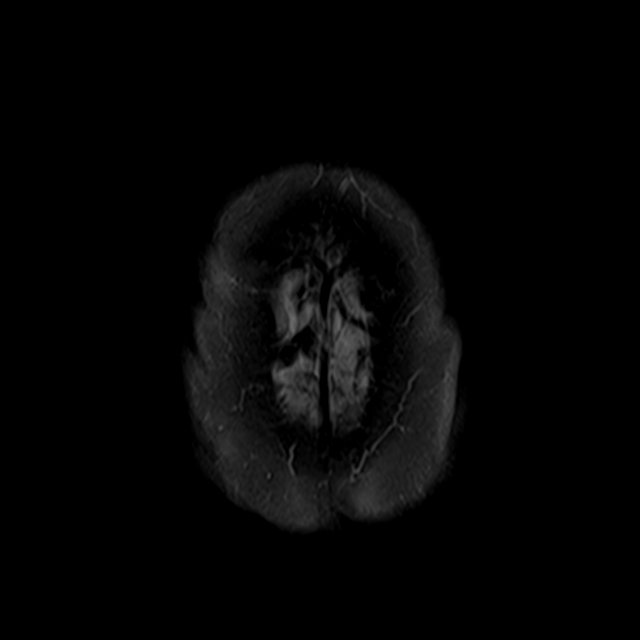

[T2 · coronal · 5.0mm · 0.34mm/px · 2 of 28 slices shown (2 of 2)]
[im 1/28]
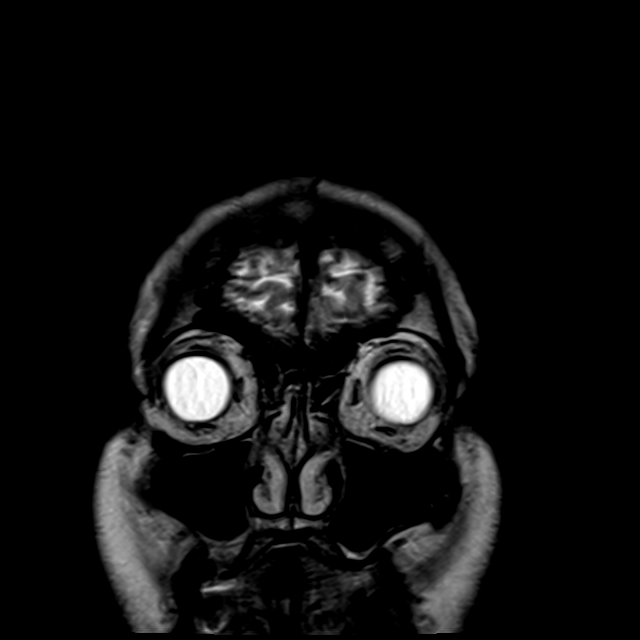
[im 28/28]
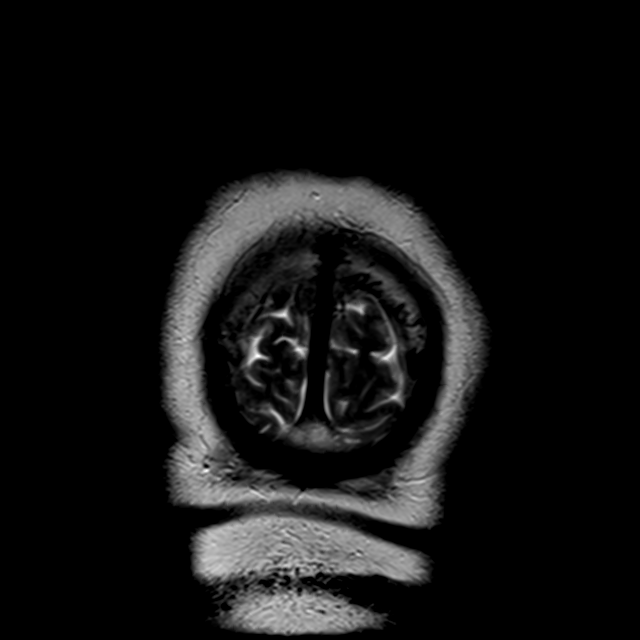

[44 of 48 positions shown; findings below may reference images not displayed]

FINDINGS: Brain: The midline structures are normal. Large area of abnormal
diffusion restriction within the posterior right MCA territory.
There is a small area of diffusion restriction within the right PCA
territory. Multiple old cerebellar infarcts and basal ganglia
lacunar infarcts. No mass lesion, hydrocephalus, dural abnormality
or extra-axial collection. Early confluent hyperintense T2-weighted
signal of the periventricular and deep white matter, most commonly
due to chronic ischemic microangiopathy. There is edema within the
posterior right hemisphere in the distribution of diffusion
abnormality. No age-advanced or lobar predominant atrophy. There is
petechial hemorrhage within the posterior right MCA territory, but
no space-occupying hematoma.

Vascular: Major intracranial arterial and venous sinus flow voids
are preserved.

Skull and upper cervical spine: The visualized skull base,
calvarium, upper cervical spine and extracranial soft tissues are
normal.

Sinuses/Orbits: No fluid levels or advanced mucosal thickening. No
mastoid or middle ear effusion. Normal orbits.
IMPRESSION: 1. Large area of acute ischemia within the posterior right
hemisphere, involving the posterior right MCA territory and a small
portion of the right PCA territory. Involvement of 2 vascular
territories may indicate an embolic source.
2. Petechial hemorrhage in the posterior right MCA distribution, but
no space-occupying hematoma. No midline shift or other mass effect.
3. Old basal ganglia and cerebellar lacunar infarcts superimposed on
chronic microvascular ischemia.
